# Patient Record
Sex: Male | Born: 1940 | Race: White | Hispanic: No | Marital: Married | Smoking: Never smoker
Health system: Southern US, Community
[De-identification: ages and names within clinical notes are randomized; demographics above are authoritative.]

## PROBLEM LIST (undated history)

## (undated) DIAGNOSIS — I2510 Atherosclerotic heart disease of native coronary artery without angina pectoris: Secondary | ICD-10-CM

## (undated) DIAGNOSIS — N481 Balanitis: Secondary | ICD-10-CM

## (undated) DIAGNOSIS — F32A Depression, unspecified: Secondary | ICD-10-CM

## (undated) DIAGNOSIS — N471 Phimosis: Secondary | ICD-10-CM

## (undated) DIAGNOSIS — F329 Major depressive disorder, single episode, unspecified: Secondary | ICD-10-CM

## (undated) DIAGNOSIS — I10 Essential (primary) hypertension: Secondary | ICD-10-CM

## (undated) DIAGNOSIS — G4730 Sleep apnea, unspecified: Secondary | ICD-10-CM

## (undated) DIAGNOSIS — N210 Calculus in bladder: Secondary | ICD-10-CM

## (undated) DIAGNOSIS — E119 Type 2 diabetes mellitus without complications: Secondary | ICD-10-CM

## (undated) DIAGNOSIS — C61 Malignant neoplasm of prostate: Secondary | ICD-10-CM

## (undated) DIAGNOSIS — E7800 Pure hypercholesterolemia, unspecified: Secondary | ICD-10-CM

## (undated) DIAGNOSIS — K219 Gastro-esophageal reflux disease without esophagitis: Secondary | ICD-10-CM

## (undated) HISTORY — DX: Depression, unspecified: F32.A

## (undated) HISTORY — PX: CIRCUMCISION: SUR203

## (undated) HISTORY — DX: Phimosis: N47.1

## (undated) HISTORY — DX: Essential (primary) hypertension: I10

## (undated) HISTORY — DX: Major depressive disorder, single episode, unspecified: F32.9

## (undated) HISTORY — DX: Pure hypercholesterolemia, unspecified: E78.00

## (undated) HISTORY — PX: HERNIA REPAIR: SHX51

## (undated) HISTORY — PX: CYSTOSCOPY: SUR368

## (undated) HISTORY — DX: Sleep apnea, unspecified: G47.30

## (undated) HISTORY — DX: Type 2 diabetes mellitus without complications: E11.9

## (undated) HISTORY — DX: Balanitis: N48.1

## (undated) HISTORY — DX: Calculus in bladder: N21.0

## (undated) HISTORY — DX: Gastro-esophageal reflux disease without esophagitis: K21.9

## (undated) HISTORY — PX: PROSTATE BIOPSY: SHX241

## (undated) HISTORY — DX: Malignant neoplasm of prostate: C61

## (undated) HISTORY — PX: OTHER SURGICAL HISTORY: SHX169

---

## 1998-12-01 ENCOUNTER — Encounter: Admission: RE | Admit: 1998-12-01 | Discharge: 1998-12-01 | Payer: Self-pay | Admitting: Family Medicine

## 1998-12-01 ENCOUNTER — Encounter: Payer: Self-pay | Admitting: Family Medicine

## 1998-12-03 ENCOUNTER — Encounter: Admission: RE | Admit: 1998-12-03 | Discharge: 1998-12-03 | Payer: Self-pay | Admitting: Family Medicine

## 1998-12-03 ENCOUNTER — Encounter: Payer: Self-pay | Admitting: Family Medicine

## 1999-12-30 ENCOUNTER — Encounter: Payer: Self-pay | Admitting: Gastroenterology

## 1999-12-30 ENCOUNTER — Encounter: Admission: RE | Admit: 1999-12-30 | Discharge: 1999-12-30 | Payer: Self-pay | Admitting: Gastroenterology

## 2000-03-24 ENCOUNTER — Encounter (INDEPENDENT_AMBULATORY_CARE_PROVIDER_SITE_OTHER): Payer: Self-pay

## 2000-03-24 ENCOUNTER — Ambulatory Visit (HOSPITAL_COMMUNITY): Admission: RE | Admit: 2000-03-24 | Discharge: 2000-03-24 | Payer: Self-pay | Admitting: Gastroenterology

## 2000-04-29 ENCOUNTER — Ambulatory Visit (HOSPITAL_BASED_OUTPATIENT_CLINIC_OR_DEPARTMENT_OTHER): Admission: RE | Admit: 2000-04-29 | Discharge: 2000-04-29 | Payer: Self-pay | Admitting: Internal Medicine

## 2000-05-17 ENCOUNTER — Encounter: Admission: RE | Admit: 2000-05-17 | Discharge: 2000-08-03 | Payer: Self-pay | Admitting: Internal Medicine

## 2000-06-03 ENCOUNTER — Ambulatory Visit (HOSPITAL_BASED_OUTPATIENT_CLINIC_OR_DEPARTMENT_OTHER): Admission: RE | Admit: 2000-06-03 | Discharge: 2000-06-03 | Payer: Self-pay | Admitting: Family Medicine

## 2001-02-09 ENCOUNTER — Encounter: Admission: RE | Admit: 2001-02-09 | Discharge: 2001-02-09 | Payer: Self-pay | Admitting: Internal Medicine

## 2001-02-09 ENCOUNTER — Encounter: Payer: Self-pay | Admitting: Internal Medicine

## 2001-06-26 ENCOUNTER — Ambulatory Visit (HOSPITAL_COMMUNITY): Admission: RE | Admit: 2001-06-26 | Discharge: 2001-06-26 | Payer: Self-pay | Admitting: Gastroenterology

## 2003-07-30 ENCOUNTER — Inpatient Hospital Stay (HOSPITAL_BASED_OUTPATIENT_CLINIC_OR_DEPARTMENT_OTHER): Admission: RE | Admit: 2003-07-30 | Discharge: 2003-07-30 | Payer: Self-pay | Admitting: Interventional Cardiology

## 2004-03-25 ENCOUNTER — Encounter (INDEPENDENT_AMBULATORY_CARE_PROVIDER_SITE_OTHER): Payer: Self-pay | Admitting: Specialist

## 2004-03-25 ENCOUNTER — Ambulatory Visit (HOSPITAL_COMMUNITY): Admission: RE | Admit: 2004-03-25 | Discharge: 2004-03-25 | Payer: Self-pay | Admitting: Gastroenterology

## 2006-03-21 ENCOUNTER — Encounter: Admission: RE | Admit: 2006-03-21 | Discharge: 2006-03-21 | Payer: Self-pay | Admitting: Internal Medicine

## 2007-03-20 ENCOUNTER — Ambulatory Visit (HOSPITAL_BASED_OUTPATIENT_CLINIC_OR_DEPARTMENT_OTHER): Admission: RE | Admit: 2007-03-20 | Discharge: 2007-03-20 | Payer: Self-pay | Admitting: Urology

## 2009-12-22 ENCOUNTER — Ambulatory Visit
Admission: RE | Admit: 2009-12-22 | Discharge: 2009-12-22 | Payer: Self-pay | Source: Home / Self Care | Attending: Urology | Admitting: Urology

## 2010-03-16 LAB — POCT I-STAT, CHEM 8
BUN: 11 mg/dL (ref 6–23)
Calcium, Ion: 1.11 mmol/L — ABNORMAL LOW (ref 1.12–1.32)
Chloride: 97 mEq/L (ref 96–112)
Creatinine, Ser: 0.9 mg/dL (ref 0.4–1.5)
Glucose, Bld: 140 mg/dL — ABNORMAL HIGH (ref 70–99)
HCT: 62 % — ABNORMAL HIGH (ref 39.0–52.0)
Hemoglobin: 21.1 g/dL (ref 13.0–17.0)
Potassium: 3 mEq/L — ABNORMAL LOW (ref 3.5–5.1)
Sodium: 138 mEq/L (ref 135–145)
TCO2: 29 mmol/L (ref 0–100)

## 2010-03-16 LAB — GLUCOSE, CAPILLARY: Glucose-Capillary: 140 mg/dL — ABNORMAL HIGH (ref 70–99)

## 2010-05-07 ENCOUNTER — Other Ambulatory Visit: Payer: Self-pay | Admitting: Dermatology

## 2010-05-19 NOTE — Op Note (Signed)
NAME:  Wesley Winters, Wesley Winters                  ACCOUNT NO.:  [id number]   MEDICAL RECORD NO.:  [id number]          PATIENT TYPE:  AMB   LOCATION:  [facility]                         FACILITY:  [facility]   PHYSICIAN:  Mark C. Karsten Ro, M.D.  DATE OF BIRTH:  30-Sep-1940   DATE OF PROCEDURE:  03/20/2007  DATE OF DISCHARGE:                               OPERATIVE REPORT   PREOPERATIVE DIAGNOSIS:  Phimosis and balanitis.   POSTOPERATIVE DIAGNOSIS:  Phimosis and balanitis.   PROCEDURE:  Circumcision.   SURGEON:  Mark C. Karsten Ro, M.D.   ANESTHESIA:  General with local supplement.   SPECIMENS:  None.   DRAINS:  None.   BLOOD LOSS:  Minimal.   COMPLICATIONS:  None.   INDICATIONS:  The patient is a 70 year old white male diabetic with  significant phimosis.  He has had intermittent mild balanitis, and is  unable to retract his foreskin for hygiene.  We have discussed the  treatment options.  He has elected to proceed with circumcision  understanding the risks, complications, alternatives.   DESCRIPTION OF OPERATION:  After informed consent, the patient was  brought to the major OR, placed on the table, administered general  anesthesia; after which his genitalia was sterilely prepped and draped.  An official time-out was then performed.  I then attempted to retract  his foreskin, but was able to, due to have significant scarring and  phimosis.  I was able to retract the foreskin enough to visualize the  glans, and I placed a straight clamp across the phimotic foreskin at the  12 o'clock position, clamped this in the midline, and then cut with the  Metzenbaum scissors along the clamped area.  This dorsal slit allowed me  to then retract the foreskin, and visualize the glans and the inner  preputial skin.  These areas were then reprepped with Betadine.   A circumcising incision was then made about 7 mm proximal to the corona  circumferentially.  I had to go a little bit further back than I usually  do  because of the significant scarring of the shaft skin that would  require excision.  I then replaced the foreskin in its normal anatomic  position, and performed a second circumcising incision  circumferentially, and this encompassed all of the very severely scarred  tissue.  I then incised the preputial skin in the midline, and then  excised this from the penile shaft.   Bleeding points were then cauterized, and the frenulum was  reapproximated in the midline with several interrupted 4-0 chromic  sutures.  A 3-0 chromic suture was then used as a U-stitch at the 6  o'clock position, and a second 3-0 chromic was placed at the 12 o'clock  position.  These were then used to reapproximate the skin edges  circumferentially.  I then applied Neosporin, performed a dorsal penile  block with 20 mL of plain Marcaine at 1/2% concentration, and then used  10 mL to perform a ring block at the base of the penis.  Sterile gauze  was loosely wrapped around the penis, and a Coban was  then used to affix  this to the penis loosely.  The patient tolerated the procedure well.  There were no intraoperative complications.  Needle, sponge, and  instrument counts were reportedly correct x2 at the end of the  operation.   He will be given a prescription for Tylox #30 and Cipro 500 mg b.i.d.  #10, and will follow up in my office in 1 week.      Mark C. Karsten Ro, M.D.  Electronically Signed     MCO/MEDQ  D:  03/20/2007  T:  03/20/2007  Job:  [id number]

## 2010-05-22 NOTE — Procedures (Signed)
[place]  Patient:    Wesley Winters, Wesley Winters                           MRN: [id number] Proc. Date: 03/24/00 Attending:  Raymon Mutton, M.D.                           Procedure Report  PROCEDURE:  Esophagogastroduodenoscopy and gastric polyp biopsy.  PROCEDURE INDICATION:  Mr. Wesley Winters. Winters (date of birth [birth year]) is a 70 year old male with unexplained epigastric and chest pain.  In May 1993, Wesley Winters underwent colonoscopy, and a 5 mm adenomatous polyp was removed.  In February 1994 and February 1995, esophagogastroduodenoscopy exams were normal.  On each occasion, the CLOtest for Helicobacter pylori antral gastritis were negative.  In August 1996, proctocolonoscopy to the cecum was normal.  In February 1995 and October 1998, abdominal ultrasounds revealed a normal gallbladder.  In November 1998, an esophagogastroduodenoscopy was normal.  A 5 mm sessile polyp was removed from the distal gastric antrum.  In January 1999, esophageal manometry was normal.  Twenty-four hour esophageal pH monitor was normal.  On December 30, 1999, upper GI x-ray series revealed a small hiatal hernia and trace gastroesophageal reflux; otherwise, the exam was normal.  His abdominal ultrasound revealed diffuse fatty infiltration of the gallbladder, incompletely visualized pancreas, bilateral renal cysts, and no gallstones.  CHRONIC MEDICATIONS:  Potassium chloride, Prilosec, Lotrel, Nitrostat, Celexa, temazepam, enteric-coated aspirin, Claritin, Pravachol, and Flonase.  ENDOSCOPIST:  Raymon Mutton, M.D.  PREMEDICATION:   Fentanyl 50 mcg, Versed 7 mg.  ENDOSCOPE:  Olympus gastroscope.  DESCRIPTION OF PROCEDURE:  After obtaining informed consent, the patient was placed in the left lateral decubitus position.  I administered intravenous fentanyl and intravenous Versed to achieve conscious sedation for the procedure.  The patients blood pressure,  oxygen saturation, and cardiac rhythm were monitored throughout the procedure and documented in the medical record.  The Olympus gastroscope was passed through the posterior hypopharynx into the proximal esophagus without difficulty.  The hypopharynx, larynx, and vocal cords appeared normal.  Esophagoscopy:  The proximal, mid-, and lower segments of the esophagus appeared normal.  The squamocolumnar junction and the esophagogastric junction are noted at approximately 40 cm from the incisor teeth.  Endoscopically, there is no evidence for the presence of Barretts esophagus, erosive esophagitis, esophageal mucosal scarring, or esophageal ulceration.  Gastroscopy:  Retroflex view of the gastric cardia and fundus was normal.  In the distal gastric body, there is a 3 mm sessile polyp, which was biopsied twice.  There appeared to be an excessive amount of bleeding following the biopsy, and an Endoclip was applied to ensure hemostasis.  In the proximal gastric antrum, a 2 mm sessile polyp was biopsied.  Otherwise, the gastric body, antrum, and pylorus appeared normal.  Duodenoscopy:  Duodenal bulb, mid-duodenum, and distal duodenum appeared normal.  ASSESSMENT:  A 5 mm polyp was biopsied from the distal gastric body, and an Endoclip was applied to the polyp to ensure hemostasis.  A 2 mm sessile polyp was biopsied from the distal gastric antrum.  Otherwise normal esophagogastroduodenoscopy.  PLAN:  I will schedule Wesley Winters to see me back in the office to go over his biopsies.  I suspect he has gastroesophageal reflux causing his symptoms and may need to be evaluated for laparoscopic Nissen fundoplication.  He has not undergone a CT  scan of the abdomen, which might be wise to obtain preoperatively to get a good view of the pancreas. DD:  03/24/00 TD:  03/24/00 Job: [id number]

## 2010-05-22 NOTE — Cardiovascular Report (Signed)
NAME:  Wesley Winters, Wesley Winters                            ACCOUNT NO.:  [id number]   MEDICAL RECORD NO.:  [id number]                   PATIENT TYPE:  OIB   LOCATION:  6501                                 FACILITY:  [facility]   PHYSICIAN:  Sinclair Grooms, M.D.            DATE OF BIRTH:  [birth year]   DATE OF PROCEDURE:  07/30/2003  DATE OF DISCHARGE:  07/30/2003                              CARDIAC CATHETERIZATION   INDICATION FOR STUDY:  Recent abnormal Cardiolite study.  The patient has  prior history of coronary artery disease with LAD stent and right coronary  stent.  Recent episodes of chest burning.   PROCEDURE PERFORMED:  1. Left heart.  2. Selective coronary angiography.  3. Left ventriculography.   DESCRIPTION:  After informed consent a 4 French sheath was placed in the  right femoral using a modified Seldinger technique.  A 4 French A2  multipurpose catheter was used for hemodynamic recordings, left  ventriculography by hand injection, and selective right coronary  angiography.  A #4 left Judkins catheter was used for left coronary  angiography.  The patient tolerated the procedure well without complication.   RESULTS:  1. Hemodynamic data:     a. Aortic pressure 119/62.     b. Left ventricular pressure 122/2.  2. Left ventriculography:  LV systolic function is normal.  The EF is     greater than 70%.  No MR is noted.  3. Coronary angiogram:     a. Left main coronary:  Widely patent.     b. Left anterior descending coronary artery:  The mid LAD contained 30%        eccentric narrowing.  The LAD stent is widely patent.  The LAD is        transapical.  No significant marginal stenoses are noted.     c. Circumflex artery:  The circumflex was large, gives origin to two        large obtuse marginal branches, both of which are normal.     d. Right coronary:  The right coronary is widely patent.  The proximal        and mid right coronary stents are widely patent.   CONCLUSIONS:  1.  False positive stress Cardiolite study demonstrating an inferior wall     abnormality that does not represent a perfusion defect.  2. Essentially widely-patent coronaries with patent left anterior descending     coronary artery and right coronary     stents.  No significant obstructive lesions are noted throughout the     coronary arterial tree.  3. Normal left ventricular function.   PLAN:  Manage chest discomfort as gastrointestinal in origin.  Sinclair Grooms, M.D.    HWS/MEDQ  D:  07/30/2003  T:  07/30/2003  Job:  [id number]   cc:   Wenda Low, M.D.  [place], [address]  [place] [address]  Fax: [phone]   Delanna Ahmadi, M.D.  [address] [place] [address]  Fax: [phone]

## 2010-05-22 NOTE — Op Note (Signed)
Wesley Winters, RASK NO.:  [id number]   MEDICAL RECORD NO.:  [id number]          PATIENT TYPE:  AMB   LOCATION:  ENDO                         FACILITY:  [facility]   PHYSICIAN:  Earle Gell, M.D.   DATE OF BIRTH:  31-Jan-1940   DATE OF PROCEDURE:  03/25/2004  DATE OF DISCHARGE:                                 OPERATIVE REPORT   PROCEDURE:  Esophagogastroduodenoscopy   PROCEDURE INDICATIONS:  Wesley Winters is a 70 year old male born Jun 01, 1940.  Wesley Winters has chronic gastroesophageal reflux manifested by burning  retrosternal discomfort (heartburn) nausea with occasional dry heaves, but  no associated dysphagia or dilatation.   January 22, 1997, esophageal manometry and 24-hour esophageal pH study were  normal.   February 23, 1992, and February 22, 1993, esophagogastroduodenoscopy with  CLO test negative.   Wesley. Kilmer' symptoms did not improve on Nexium 40 mg twice daily.   CURRENT MEDICATIONS:  1.  Nexium 40 mg each morning.  2.  Lotrel for hypertension.  3.  Diovan for hypertension.  4.  Fluoxetine.  5.  Temazepam.  6.  Enteric-coated aspirin.  7.  Allegra.  8.  Lipitor.  9.  Astelin nasal spray.  10. Amaryl for diabetes.  11. Metformin for diabetes.  12. Nitrostat for angina pectoris.   ENDOSCOPIST:  Earle Gell, M.D.   PREMEDICATION:  Versed 5 mg, Demerol 50 mg.   PROCEDURE:  After obtaining informed consent,  Wesley Winters was placed in the  left lateral decubitus position. I administered intravenous Demerol and  intravenous Versed to achieve conscious sedation for the procedure. The  patient's blood pressure, oxygen saturation and cardiac rhythm were  monitored throughout the procedure and documented in the medical record.   The Olympus gastroscope was passed through the posterior hypopharynx into  the proximal esophagus without difficulty. The hypopharynx, larynx and vocal  cords appeared normal.   Esophagoscopy: The proximal, mid and lower  segments of the esophageal mucosa  appear completely normal. The squamocolumnar junction and esophagogastric  junction are noted at 42 cm from the incisor teeth. There are no esophageal  mucosal erosions. There is no endoscopic evidence for the presence of  erosive esophagitis, Barrett's esophagus or esophageal mucosal scarring with  stricture formation.   Gastroscopy: Retroflex view of the gastric cardia and fundus was normal.  There are scattered raised erosions in the gastric body which were biopsied.  There are numerous linear erosions in the gastric antrum. The pylorus  appears normal.   Duodenoscopy: The duodenal bulb, mid duodenum and distal duodenum appeared  normal.   ASSESSMENT:  1.  Gastroesophageal reflux associated with a normal-appearing esophagus.  2.  Raised erosions in the gastric body and linear erosions in the gastric      antrum, probably secondary to aspirin. Biopsies to look for Helicobacter      pylori pending.   RECOMMENDATIONS:  I will ask Wesley. Wesley Winters to stop aspirin for a while and  continue to take Nexium each morning and see if that improves his symptoms.  If there is no improvement, I will  repeat his esophageal pH study on  Nexium.      MJ/MEDQ  D:  03/25/2004  T:  03/25/2004  Job:  [id number]   cc:   Delanna Ahmadi, M.D.  [address] [place] [address]  Fax: [phone]

## 2010-05-22 NOTE — Procedures (Signed)
[facility]  Patient:    Wesley Winters, Wesley Winters Visit Number: [id number] MRN: [id number]          Service Type: END Location: ENDO Attending Physician:  Mauri Brooklyn Ii Dictated by:   Mickeal Skinner, M.D. Proc. Date: 06/26/01 Admit Date:  06/26/2001 Discharge Date: 06/26/2001                             Procedure Report  PROCEDURE:  Screening colonoscopy.  REFERRING PHYSICIAN:  Dr. Ala Bent.  INDICATION FOR PROCEDURE:  The patient is a 70 year old male born June 08, 1939. [name] is due for his first screening colonoscopy with polypectomy to prevent colon cancer.  I discussed with the patient the complications associated with colonoscopy and polypectomy including a 15/1000 risk of bleeding and 04/998 risk of colon perforation requiring surgical repair. [name] has signed the operative permit.  ENDOSCOPIST:  Mickeal Skinner, M.D.  PREMEDICATION:  Versed 5 mg, fentanyl 50 mcg.  ENDOSCOPE:  Olympus pediatric colonoscope.  DESCRIPTION OF PROCEDURE:  After obtaining informed consent, the patient was placed in the left lateral decubitus position. I administered intravenous fentanyl and intravenous Versed to achieve conscious sedation for the procedure. The patients cardiac rhythm, oxygen saturation and blood pressure were monitored throughout the procedure and documented in the medical record.  Anal inspection was normal. Digital rectal exam revealed an enlarged but non-nodular prostate. The Olympus pediatric video colonoscope was introduced into the rectum and easily advanced to the cecum. Colonic preparation for the exam today was excellent.  RECTUM:  From the mid rectum, a 1 mm sessile polyp was removed with the hot biopsy forceps.  SIGMOID COLON/DESCENDING COLON:  Normal.  SPLENIC FLEXURE:  Normal.  TRANSVERSE COLON:  Normal.  HEPATIC FLEXURE:  Normal.  ASCENDING COLON:  Normal.  CECUM/ILEOCECAL VALVE:   Normal.  ASSESSMENT: From the mid rectum, a 1 mm sessile polyp was removed with the hot biopsy forceps; otherwise, normal proctocolonoscopy to the cecum.  RECOMMENDATIONS:  If rectal polyp returns neoplastic pathologically, [name] should undergo a repeat colonoscopy in five years. Dictated by:   Mickeal Skinner, M.D. Attending Physician:  Mauri Brooklyn Ii DD:  06/26/01 TD:  06/26/01 Job: [id number]

## 2010-05-22 NOTE — Cardiovascular Report (Signed)
NAME:  Wesley Winters, Wesley Winters                            ACCOUNT NO.:  [id number]   MEDICAL RECORD NO.:  [id number]                   PATIENT TYPE:  OIB   LOCATION:  6501                                 FACILITY:  [facility]   PHYSICIAN:  Sinclair Grooms, M.D.            DATE OF BIRTH:  26-Jul-1940   DATE OF PROCEDURE:  DATE OF DISCHARGE:  07/30/2003                              CARDIAC CATHETERIZATION   Audio too short to transcribe (less than 5 seconds)                                               Sinclair Grooms, M.D.    HWS/MEDQ  D:  07/30/2003  T:  07/30/2003  Job:  [id number]

## 2010-06-18 ENCOUNTER — Other Ambulatory Visit: Payer: Self-pay | Admitting: Dermatology

## 2010-08-03 ENCOUNTER — Other Ambulatory Visit: Payer: Self-pay | Admitting: Gastroenterology

## 2010-09-28 LAB — POCT I-STAT 4, (NA,K, GLUC, HGB,HCT)
Glucose, Bld: 153 — ABNORMAL HIGH
HCT: 38 — ABNORMAL LOW
Hemoglobin: 12.9 — ABNORMAL LOW
Operator id: 268271
Potassium: 3.8
Sodium: 139

## 2012-08-21 ENCOUNTER — Other Ambulatory Visit: Payer: Self-pay | Admitting: Dermatology

## 2014-03-28 ENCOUNTER — Other Ambulatory Visit: Payer: Self-pay | Admitting: Dermatology

## 2014-09-23 ENCOUNTER — Other Ambulatory Visit: Payer: Self-pay | Admitting: General Surgery

## 2014-09-24 ENCOUNTER — Other Ambulatory Visit: Payer: Self-pay | Admitting: General Surgery

## 2014-09-24 DIAGNOSIS — R2242 Localized swelling, mass and lump, left lower limb: Secondary | ICD-10-CM

## 2014-10-07 ENCOUNTER — Ambulatory Visit
Admission: RE | Admit: 2014-10-07 | Discharge: 2014-10-07 | Disposition: A | Discharge status: Home / Self Care | Payer: Medicare Other | Source: Ambulatory Visit | Attending: General Surgery | Admitting: General Surgery

## 2014-10-07 MED ORDER — GADOBENATE DIMEGLUMINE 529 MG/ML IV SOLN
20.0000 mL | Freq: Once | INTRAVENOUS | Status: AC | PRN
  Administered 2014-10-07: 20 mL via INTRAVENOUS

## 2014-10-10 ENCOUNTER — Other Ambulatory Visit (HOSPITAL_COMMUNITY): Payer: Self-pay | Admitting: Urology

## 2014-10-10 DIAGNOSIS — C61 Malignant neoplasm of prostate: Secondary | ICD-10-CM

## 2014-10-15 ENCOUNTER — Encounter (HOSPITAL_COMMUNITY)
Admission: RE | Admit: 2014-10-15 | Discharge: 2014-10-15 | Disposition: A | Discharge status: Home / Self Care | Payer: Medicare Other | Source: Ambulatory Visit | Attending: Urology | Admitting: Urology

## 2014-10-15 DIAGNOSIS — C61 Malignant neoplasm of prostate: Secondary | ICD-10-CM | POA: Diagnosis not present

## 2014-10-15 MED ORDER — TECHNETIUM TC 99M MEDRONATE IV KIT
26.4000 | PACK | Freq: Once | INTRAVENOUS | Status: AC | PRN
  Administered 2014-10-15: 26.4 via INTRAVENOUS

## 2014-11-01 ENCOUNTER — Encounter: Payer: Self-pay | Admitting: Radiation Oncology

## 2014-11-01 NOTE — Progress Notes (Signed)
GU Location of Tumor / Histology: prostatic adenocarcinoma   If Prostate Cancer, Gleason Score is (4 + 5) and PSA is (11.06)  Thea Silversmith presented 05/2001 with an elevated PSA of 4.03 and no abnormalities on DRE. Has been under active surveillance since.  Third biopsies of prostate revealed:    Past/Anticipated interventions by urology, if any: prostate biopsy x 3, active surveillance, referred to Dr. Tammi Klippel   Past/Anticipated interventions by medical oncology, if any: no  Weight changes, if any: no  Bowel/Bladder complaints, if any: urinary frequency, feelings of urinary urgency, nocturia, incontinence, difficulty starting the urinary stream, weak urine stream, intermittent urine stream. Denies dysuria or hematuria.  Nausea/Vomiting, if any: no  Pain issues, if any:  no  SAFETY ISSUES:  Prior radiation? no  Pacemaker/ICD? no  Possible current pregnancy? no  Is the patient on methotrexate? no  Current Complaints / other details:  74 year old male. Married. Retired. PROSTATE VOLUME 124 CC

## 2014-11-04 ENCOUNTER — Encounter: Payer: Self-pay | Admitting: Radiation Oncology

## 2014-11-04 ENCOUNTER — Ambulatory Visit
Admission: RE | Admit: 2014-11-04 | Discharge: 2014-11-04 | Disposition: A | Discharge status: Home / Self Care | Payer: Medicare Other | Source: Ambulatory Visit | Attending: Radiation Oncology | Admitting: Radiation Oncology

## 2014-11-04 ENCOUNTER — Encounter: Payer: Self-pay | Admitting: Medical Oncology

## 2014-11-04 VITALS — BP 129/64 | HR 70 | Resp 16 | Ht 68.0 in | Wt 265.6 lb

## 2014-11-04 DIAGNOSIS — N481 Balanitis: Secondary | ICD-10-CM | POA: Insufficient documentation

## 2014-11-04 DIAGNOSIS — Z809 Family history of malignant neoplasm, unspecified: Secondary | ICD-10-CM | POA: Diagnosis not present

## 2014-11-04 DIAGNOSIS — I10 Essential (primary) hypertension: Secondary | ICD-10-CM | POA: Diagnosis not present

## 2014-11-04 DIAGNOSIS — E7800 Pure hypercholesterolemia, unspecified: Secondary | ICD-10-CM | POA: Insufficient documentation

## 2014-11-04 DIAGNOSIS — F329 Major depressive disorder, single episode, unspecified: Secondary | ICD-10-CM | POA: Insufficient documentation

## 2014-11-04 DIAGNOSIS — C61 Malignant neoplasm of prostate: Secondary | ICD-10-CM | POA: Insufficient documentation

## 2014-11-04 DIAGNOSIS — Z510 Encounter for antineoplastic radiation therapy: Secondary | ICD-10-CM | POA: Diagnosis not present

## 2014-11-04 DIAGNOSIS — E119 Type 2 diabetes mellitus without complications: Secondary | ICD-10-CM | POA: Diagnosis not present

## 2014-11-04 NOTE — Progress Notes (Signed)
Oncology Nurse Navigator Documentation  Oncology Nurse Navigator Flowsheets 11/04/2014  Referral date to RadOnc/MedOnc 10/29/2014  Navigator Encounter Type Initial RadOnc- I introduced myself to Mr. Wesley Winters and his wife and explained my role as the navigator. I gave them my business card and I will follow them as the begins his radiation treatments. I asked them to call me with any questions or concerns and they voiced understanding.  Patient Visit Type Radonc  Barriers/Navigation Needs No barriers at this time  Support Groups/Services Friends and Family  Time Spent with Patient 15

## 2014-11-04 NOTE — Progress Notes (Signed)
Radiation Oncology         [phone] ________________________________  Initial outpatient Consultation  Name: Wesley Winters MRN: [id number]  Date: 11/04/2014  DOB: [birth year]  [id number] Wesley John, MD  [name], MD   REFERRING PHYSICIAN: [name], MD  DIAGNOSIS: 74 y.o. 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  HISTORY OF PRESENT ILLNESS::Wesley Winters is a 74 y.o. gentleman originally diagnosed with prostate cancer on 11/03/07. At that time, he had one of 12 cores positive with Gleason's 3+3 in the right lateral apex. Appropriately, he elected active surveillance. More recently, he was noted to have an elevated PSA of 11.06. He was seen for evaluation in urology by Dr. Karsten Ro on 09/10/14,  digital rectal examination was performed at that time revealing a palpable nodule involving the left apex.  The patient proceeded to transrectal ultrasound with 12 biopsies of the prostate on 10/01/14.  The prostate volume measured 124 cc.  Out of 12 core biopsies, 7 were positive.  The maximum Gleason score was 4+5, and this was seen in the left lateral mid.  On 10/15/14, bone scan showed no evidence of metastatic disease. That same day, CT of the pelvis showed prostatomegaly with no evidence of metastatic disease.   The patient reviewed the biopsy results with his urologist and he has kindly been referred today for discussion of potential radiation treatment options.    PREVIOUS RADIATION THERAPY: No  PAST MEDICAL HISTORY:  has a past medical history of Prostate cancer ([place]); Bladder calculus; Balanitis; Depression; Diabetes mellitus without complication ([place]); GERD (gastroesophageal reflux disease); Hypercholesterolemia; Hypertension; Phimosis; and Sleep apnea.    PAST SURGICAL HISTORY: Past Surgical History  Procedure Laterality Date  . Circumcision    . Cystoscopy    . Prostate biopsy    .  Prostate biopsy    . Prostate biopsy    . Three cardiac stents      FAMILY HISTORY: family history includes Cancer in his brother.  SOCIAL HISTORY:  reports that he has never smoked. He has never used smokeless tobacco. He reports that he does not drink alcohol or use illicit drugs.  ALLERGIES: Amoxicillin  MEDICATIONS:  Current Outpatient Prescriptions  Medication Sig Dispense Refill  . amLODipine (NORVASC) 10 MG tablet   2  . aspirin 81 MG tablet Take 81 mg by mouth daily.    Marland Kitchen atorvastatin (LIPITOR) 40 MG tablet Take 40 mg by mouth daily.    Marland Kitchen azelastine (ASTELIN) 0.1 % nasal spray Place into both nostrils 2 (two) times daily. Use in each nostril as directed    . benazepril (LOTENSIN) 20 MG tablet Take 20 mg by mouth daily.    . fexofenadine (ALLEGRA) 180 MG tablet Take 180 mg by mouth daily.    Marland Kitchen FLUoxetine (PROZAC) 10 MG tablet   2  . fluticasone (FLONASE) 50 MCG/ACT nasal spray Place into both nostrils daily.    Marland Kitchen glimepiride (AMARYL) 4 MG tablet Take 4 mg by mouth daily with breakfast.    . hydrochlorothiazide (HYDRODIURIL) 25 MG tablet Take 25 mg by mouth daily.    Marland Kitchen linagliptin (TRADJENTA) 5 MG TABS tablet Take 5 mg by mouth daily.    . metFORMIN (GLUCOPHAGE) 500 MG tablet Take by mouth 2 (two) times daily with a meal.    . nebivolol (BYSTOLIC) 10 MG tablet Take 10 mg by mouth daily.    . potassium chloride (  K-DUR,KLOR-CON) 10 MEQ tablet Take 10 mEq by mouth 2 (two) times daily.    . tamsulosin (FLOMAX) 0.4 MG CAPS capsule Take 0.4 mg by mouth.    . temazepam (RESTORIL) 15 MG capsule Take 15 mg by mouth at bedtime as needed for sleep.    Marland Kitchen triamcinolone cream (KENALOG) 0.1 % Apply 1 application topically 2 (two) times daily.     No current facility-administered medications for this encounter.    REVIEW OF SYSTEMS:  A 15 point review of systems is documented in the electronic medical record. This was obtained by the nursing staff. However, I reviewed this with the patient  to discuss relevant findings and make appropriate changes.  Pertinent items noted in HPI and remainder of comprehensive ROS otherwise negative..  The patient completed an IPSS and IIEF questionnaire.  His IPSS score was 28 indicating severe urinary outflow obstructive symptoms.  He indicated that his erectile function is unable to complete sexual activity.  Urinary frequency, feelings of urinary urgency, nocturia, incontinence, difficulty starting the urinary stream, weak urine stream, intermittent urine stream. Denies dysuria or hematuria. Denies pain or vomiting. The patient currently takes two Flomax nightly before bed. The patient would prefer to begin treatment the beginning of next year and to receive treatment in the afternoons.    PHYSICAL EXAM: This patient is in no acute distress.  He is alert and oriented.   height is 5\' 8"  (1.727 m) and weight is 265 lb 9.6 oz (120.475 kg). His blood pressure is 129/64 and his pulse is 70. His respiration is 16 and oxygen saturation is 100%.  He exhibits no respiratory distress or labored breathing.  He appears neurologically intact.  His mood is pleasant.  His affect is appropriate.  Please note the digital rectal exam findings described above.  KPS = 90  100 - Normal; no complaints; no evidence of disease. 90   - Able to carry on normal activity; minor signs or symptoms of disease. 80   - Normal activity with effort; some signs or symptoms of disease. 38   - Cares for self; unable to carry on normal activity or to do active work. 60   - Requires occasional assistance, but is able to care for most of his personal needs. 50   - Requires considerable assistance and frequent medical care. 52   - Disabled; requires special care and assistance. 49   - Severely disabled; hospital admission is indicated although death not imminent. 53   - Very sick; hospital admission necessary; active supportive treatment necessary. 10   - Moribund; fatal processes  progressing rapidly. 0     - Dead  Karnofsky DA, Abelmann [place], Craver LS and Burchenal [facility] [phone]) The use of the nitrogen mustards in the palliative treatment of carcinoma: with particular reference to bronchogenic carcinoma Cancer 1 634-56   LABORATORY DATA:  Lab Results  Component Value Date   HGB 21.1* 12/22/2009   HCT 62.0* 12/22/2009   Lab Results  Component Value Date   NA 138 12/22/2009   K 3.0* 12/22/2009   CL 97 12/22/2009   No results found for: ALT, AST, GGT, ALKPHOS, BILITOT   RADIOGRAPHY: Nm Bone Scan Whole Body  10/15/2014  CLINICAL DATA:  Prostate cancer, PSA 11.06, bilateral hip pain status post fall on 11/2013 EXAM: NUCLEAR MEDICINE WHOLE BODY BONE SCAN TECHNIQUE: Whole body anterior and posterior images were obtained approximately 3 hours after intravenous injection of radiopharmaceutical. RADIOPHARMACEUTICALS:  26.4 mCi Technetium-52m MDP IV COMPARISON:  CT pelvis dated 10/15/2014 FINDINGS: Degenerative radiotracer uptake involving the bilateral knees, right ankle, and bilateral feet. Suspected degenerative uptake involving the posterior aspect of the mid lumbar spine, likely L3. No abnormal accumulation of radiotracer within the axillary or appendicular skeleton specific for skeletal metastases. IMPRESSION: No scintigraphic evidence of skeletal metastases. Suspected degenerative uptake involving the posterior aspect of L3. Electronically Signed   By: Julian Hy M.D.   On: 10/15/2014 15:17   Mr Tibia Fibula Left W Wo Contrast  10/08/2014  CLINICAL DATA:  Left lower extremity soft tissue mass. Mass noticed after a fall in November 2015. Slowly enlarging since that time. EXAM: MRI OF LOWER LEFT EXTREMITY WITHOUT AND WITH CONTRAST TECHNIQUE: Multiplanar, multisequence MR imaging of the left was performed both before and after administration of intravenous contrast. CONTRAST:  78mL MULTIHANCE GADOBENATE DIMEGLUMINE 529 MG/ML IV SOLN COMPARISON:  None FINDINGS: There is a  3.5 cm rounded mass in the subcutaneous fat along the anterior medial aspect of the distal tibia. There is heterogeneous increased T1 signal intensity most likely representing hematoma. However, there is fairly significant thick rim-like enhancement and possibly some intrinsic enhancement of this lesion which is somewhat atypical for a hematoma. It may be a soft tissue mass which has bled. Also, a hematoma should contract and not enlarged with time. Soft tissue sarcoma or melanoma are possibilities. It does not have the appearance of a nerve sheath tumor. The calf musculature is normal other than mild diffuse fatty change. The underlying tibia is normal. No major vessel or nerve involvement. IMPRESSION: 1. 3.5 cm subcutaneous lesion involving the distal left lower extremity could represent a walled-off hematoma with surrounding granulation tissue but is more worrisome for a soft tissue sarcoma that has hemorrhaged. Recommend excision or biopsy. 2. No involvement of the underlying tibia or lower extremity musculature. Electronically Signed   By: Marijo Sanes M.D.   On: 10/08/2014 08:37      IMPRESSION: This gentleman is a 74 y.o. 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  His T-Stage, Gleason's Score, and PSA put him into the high risk group.  Accordingly he is eligible for a variety of potential treatment options including hormone therapy and radiation therapy. The patient could start radiation in late December versus delaying his start until late January, his preference would be to wait until late January, due to health insurance.  PLAN: Today, I talked to the patient and family about the findings and work-up thus far.  We discussed the natural history of prostatic adenocarcinoma and general treatment, highlighting the role of radiotherapy in the management.  We discussed the available radiation techniques, and focused on the details of logistics  and delivery.  We reviewed the anticipated acute and late sequelae associated with radiation in this setting.  The patient was encouraged to ask questions that I answered to the best of my ability.  The patient would like to proceed with radiation and has been scheduled for CT simulation on 01/16/14 at 3 pm.  We'll need to contact Dr. Karsten Ro to place gold markers in early January 2017.  I spent 60 minutes minutes face to face with the patient and more than 50% of that time was spent in counseling and/or coordination of care.  ------------------------------------------------  Sheral Apley. Tammi Klippel, M.D.    This document serves as a record of services personally performed by Wesley Pita, MD. It was created on his behalf by  Arlyce Harman, a trained medical scribe. The creation of this record is based on the scribe's personal observations and the provider's statements to them. This document has been checked and approved by the attending provider.

## 2014-11-04 NOTE — Progress Notes (Signed)
See progress note under physician encounter. 

## 2014-11-04 NOTE — Addendum Note (Signed)
Encounter addended by: Heywood Footman, RN on: 11/04/2014 10:00 AM<BR>     Documentation filed: Charges VN

## 2014-11-06 ENCOUNTER — Telehealth: Payer: Self-pay | Admitting: Medical Oncology

## 2014-11-06 NOTE — Telephone Encounter (Signed)
Oncology Nurse Navigator Documentation  Oncology Nurse Navigator Flowsheets 11/04/2014 11/05/2014  Referral date to RadOnc/MedOnc 10/29/2014 -  Navigator Encounter Type Initial RadOnc Telephone  Patient Visit Type Radonc -  Treatment Phase - Treatment- Wesley Winters saw Dr. Tammi Klippel yesterday regarding radiation treatments. He did not want to start his treatments until January due to insurance. After he got home he is not sure he made the right decision. We discussed the nature of prostate cancer and that the androgen deprivation is part of the treatment plan. If Dr. Tammi Klippel did not think waiting to begin radiation was an option he would have informed him. He states he feels better about his decision. I asked him to call me with any questions or concerns.  Barriers/Navigation Needs No barriers at this time Education  Education - Other  Interventions - Education Method  Education Method - Verbal  Support Groups/Services Friends and Family Friends and Family  Time Spent with Patient 15 15

## 2014-12-02 ENCOUNTER — Telehealth: Payer: Self-pay | Admitting: *Deleted

## 2014-12-02 NOTE — Telephone Encounter (Signed)
CALLED PATIENT TO INFORM OF GOLD SEED PLACEMENT ON 01-14-15- ARRIVAL TIME - 1 PM @ DR. OTTELIN'S OFFICE AND HIS SIM ON 01-17-15 @ 3 PM @ DR. MANNING'S OFFICE, LVM FOR A RETURN CALL

## 2015-01-17 ENCOUNTER — Ambulatory Visit
Admission: RE | Admit: 2015-01-17 | Discharge: 2015-01-17 | Disposition: A | Discharge status: Home / Self Care | Payer: Medicare Other | Source: Ambulatory Visit | Attending: Radiation Oncology | Admitting: Radiation Oncology

## 2015-01-17 DIAGNOSIS — Z510 Encounter for antineoplastic radiation therapy: Secondary | ICD-10-CM | POA: Diagnosis not present

## 2015-01-17 DIAGNOSIS — C61 Malignant neoplasm of prostate: Secondary | ICD-10-CM

## 2015-01-17 NOTE — Progress Notes (Signed)
  Radiation Oncology         [phone] ________________________________  Name: Wesley Winters  MRN: [id number]  Date: 01/17/2015  DOB: 1941-01-04  SIMULATION AND TREATMENT PLANNING NOTE    ICD-9-CM ICD-10-CM   1. Malignant neoplasm of prostate ([place]) 185 C61     DIAGNOSIS:  75 y.o. 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  NARRATIVE:  The patient was brought to the [facility].  Identity was confirmed.  All relevant records and images related to the planned course of therapy were reviewed.  The patient freely provided informed written consent to proceed with treatment after reviewing the details related to the planned course of therapy. The consent form was witnessed and verified by the simulation staff.  Then, the patient was set-up in a stable reproducible supine position for radiation therapy.  A vacuum lock pillow device was custom fabricated to position his legs in a reproducible immobilized position.  Then, I performed a urethrogram under sterile conditions to identify the prostatic apex.  CT images were obtained.  Surface markings were placed.  The CT images were loaded into the planning software.  Then the prostate target and avoidance structures including the rectum, bladder, bowel and hips were contoured.  Treatment planning then occurred.  The radiation prescription was entered and confirmed.  A total of one complex treatment devices were fabricated. I have requested : Intensity Modulated Radiotherapy (IMRT) is medically necessary for this case for the following reason:  Rectal sparing.Marland Kitchen  PLAN:  The patient will receive 75 Gy in 40 fractions.  ________________________________  Sheral Apley Tammi Klippel, M.D.  This document serves as a record of services personally performed by Tyler Pita, MD. It was created on his behalf by Jenell Milliner, a trained medical scribe. The creation of this record is based on  the scribe's personal observations and the provider's statements to them. This document has been checked and approved by the attending provider.

## 2015-01-21 ENCOUNTER — Ambulatory Visit: Payer: Medicare Other | Admitting: Radiation Oncology

## 2015-01-22 ENCOUNTER — Ambulatory Visit: Payer: Medicare Other

## 2015-01-22 DIAGNOSIS — Z510 Encounter for antineoplastic radiation therapy: Secondary | ICD-10-CM | POA: Diagnosis not present

## 2015-01-23 ENCOUNTER — Ambulatory Visit: Payer: Medicare Other

## 2015-01-24 ENCOUNTER — Ambulatory Visit: Payer: Medicare Other

## 2015-01-27 ENCOUNTER — Ambulatory Visit: Payer: Medicare Other

## 2015-01-28 ENCOUNTER — Ambulatory Visit
Admission: RE | Admit: 2015-01-28 | Discharge: 2015-01-28 | Disposition: A | Discharge status: Home / Self Care | Payer: Medicare Other | Source: Ambulatory Visit | Attending: Radiation Oncology | Admitting: Radiation Oncology

## 2015-01-28 ENCOUNTER — Ambulatory Visit: Payer: Medicare Other

## 2015-01-28 DIAGNOSIS — Z510 Encounter for antineoplastic radiation therapy: Secondary | ICD-10-CM | POA: Diagnosis not present

## 2015-01-28 DIAGNOSIS — C61 Malignant neoplasm of prostate: Secondary | ICD-10-CM | POA: Diagnosis not present

## 2015-01-29 ENCOUNTER — Ambulatory Visit
Admission: RE | Admit: 2015-01-29 | Discharge: 2015-01-29 | Disposition: A | Discharge status: Home / Self Care | Payer: Medicare Other | Source: Ambulatory Visit | Attending: Radiation Oncology | Admitting: Radiation Oncology

## 2015-01-29 ENCOUNTER — Ambulatory Visit: Payer: Medicare Other

## 2015-01-29 DIAGNOSIS — Z510 Encounter for antineoplastic radiation therapy: Secondary | ICD-10-CM | POA: Diagnosis not present

## 2015-01-29 DIAGNOSIS — C61 Malignant neoplasm of prostate: Secondary | ICD-10-CM | POA: Diagnosis not present

## 2015-01-30 ENCOUNTER — Ambulatory Visit: Payer: Medicare Other

## 2015-01-30 ENCOUNTER — Ambulatory Visit
Admission: RE | Admit: 2015-01-30 | Discharge: 2015-01-30 | Disposition: A | Discharge status: Home / Self Care | Payer: Medicare Other | Source: Ambulatory Visit | Attending: Radiation Oncology | Admitting: Radiation Oncology

## 2015-01-30 DIAGNOSIS — Z510 Encounter for antineoplastic radiation therapy: Secondary | ICD-10-CM | POA: Diagnosis not present

## 2015-01-30 DIAGNOSIS — C61 Malignant neoplasm of prostate: Secondary | ICD-10-CM | POA: Diagnosis not present

## 2015-01-31 ENCOUNTER — Ambulatory Visit: Payer: Medicare Other

## 2015-01-31 ENCOUNTER — Encounter: Payer: Self-pay | Admitting: Radiation Oncology

## 2015-01-31 ENCOUNTER — Ambulatory Visit
Admission: RE | Admit: 2015-01-31 | Discharge: 2015-01-31 | Disposition: A | Discharge status: Home / Self Care | Payer: Medicare Other | Source: Ambulatory Visit | Attending: Radiation Oncology | Admitting: Radiation Oncology

## 2015-01-31 VITALS — BP 136/61 | HR 75 | Resp 16 | Wt 265.7 lb

## 2015-01-31 DIAGNOSIS — Z510 Encounter for antineoplastic radiation therapy: Secondary | ICD-10-CM | POA: Diagnosis not present

## 2015-01-31 DIAGNOSIS — C61 Malignant neoplasm of prostate: Secondary | ICD-10-CM

## 2015-01-31 NOTE — Progress Notes (Addendum)
Weight and vitals stable. Denies pain. Reports nocturia every 2-3 hours during the night. Report occasional difficulty emptying his bladder. Denies urgency. Denies dysuria or hematuria. Describes his urine stream as a dribble. Reports he occasionally is incontinent of urine while sleeping. Denis diarrhea. Denies fatigue. Oriented patient to staff and routine of the clinic. Provided patient with RADIATION THERAPY AND YOU handbook then, reviewed pertinent information. Educated patient reference potential side effects and management such as, fatigue, diarrhea and urinary bladder changes. Answered all patient questions to the best of my ability. Provided patient with my business card and encouraged him to call with needs. Patient verbalized understanding of all reviewed.   BP 136/61 mmHg  Pulse 75  Resp 16  Wt 265 lb 11.2 oz (120.521 kg)  SpO2 100% Wt Readings from Last 3 Encounters:  01/31/15 265 lb 11.2 oz (120.521 kg)  11/04/14 265 lb 9.6 oz (120.475 kg)

## 2015-01-31 NOTE — Progress Notes (Signed)
  Radiation Oncology         [phone]   Name: Wesley Winters MRN: [id number]   Date: 01/31/2015  DOB: 1940/09/11   Weekly Radiation Therapy Management    ICD-9-CM ICD-10-CM   1. Malignant neoplasm of prostate ([place]) 185 C61     Current Dose: 7.2 Gy  Planned Dose:  75 Gy  Narrative The patient presents for routine under treatment assessment.  On review of systems, the patient reports that he is doing pretty well overall. He continues to have episodes of urinary incontinence and occasional dribble of his urinary stream.he reports that this is his baseline. His incontinence is mostly at nighttime sleeping. He notes occasional difficulty emptying his bladder, but denies urgency, dysuria or hematuria.  He denis diarrhea. Denies fatigue. No other complaints or verbalized.  Set-up films were reviewed. The chart was checked.  Physical Findings  weight is 265 lb 11.2 oz (120.521 kg). His blood pressure is 136/61 and his pulse is 75. His respiration is 16 and oxygen saturation is 100%.   Pain Scale 0/10 In general this is an obese Caucasian male in no acute distress. Cardiac pulmonary assessment reveals no acute distress and reveals a normal effort.  Impression Stage T2a, high risk, adenocarcinoma of the prostate with a Gleason's score of 4+5 and a PSA of 11.06  Plan The patient appears to be tolerating his radiotherapy regimen. We will proceed with subsequent treatment and follow up with him next week. He will call if questions or concerns prior to that visit.    The above documentation reflects my direct findings during this shared patient visit. Please see the separate note by Dr. Tammi Klippel on this date for the remainder of the patient's plan of care.  Carola Rhine, PAC   This document serves as a record of services personally performed by Shona Simpson, PAC and  Tyler Pita, MD. It was created on their behalf by Jenell Milliner, a trained medical scribe. The creation of this record is  based on the scribe's personal observations and the provider's statements to them. This document has been checked and approved by the attending provider.

## 2015-01-31 NOTE — Patient Instructions (Signed)
Contact our office if you have any questions following today's appointment: [phone].  

## 2015-02-02 DIAGNOSIS — Z510 Encounter for antineoplastic radiation therapy: Secondary | ICD-10-CM | POA: Insufficient documentation

## 2015-02-02 DIAGNOSIS — N481 Balanitis: Secondary | ICD-10-CM | POA: Insufficient documentation

## 2015-02-02 DIAGNOSIS — Z809 Family history of malignant neoplasm, unspecified: Secondary | ICD-10-CM

## 2015-02-02 DIAGNOSIS — F329 Major depressive disorder, single episode, unspecified: Secondary | ICD-10-CM | POA: Insufficient documentation

## 2015-02-02 DIAGNOSIS — I10 Essential (primary) hypertension: Secondary | ICD-10-CM | POA: Insufficient documentation

## 2015-02-02 DIAGNOSIS — E7800 Pure hypercholesterolemia, unspecified: Secondary | ICD-10-CM | POA: Insufficient documentation

## 2015-02-02 DIAGNOSIS — C61 Malignant neoplasm of prostate: Secondary | ICD-10-CM

## 2015-02-02 DIAGNOSIS — E119 Type 2 diabetes mellitus without complications: Secondary | ICD-10-CM | POA: Insufficient documentation

## 2015-02-03 ENCOUNTER — Ambulatory Visit: Payer: Medicare Other

## 2015-02-03 ENCOUNTER — Ambulatory Visit
Admission: RE | Admit: 2015-02-03 | Discharge: 2015-02-03 | Disposition: A | Discharge status: Home / Self Care | Payer: Medicare Other | Source: Ambulatory Visit | Attending: Radiation Oncology | Admitting: Radiation Oncology

## 2015-02-03 DIAGNOSIS — Z510 Encounter for antineoplastic radiation therapy: Secondary | ICD-10-CM | POA: Diagnosis not present

## 2015-02-04 ENCOUNTER — Ambulatory Visit
Admission: RE | Admit: 2015-02-04 | Discharge: 2015-02-04 | Disposition: A | Discharge status: Home / Self Care | Payer: Medicare Other | Source: Ambulatory Visit | Attending: Radiation Oncology | Admitting: Radiation Oncology

## 2015-02-04 ENCOUNTER — Ambulatory Visit: Payer: Medicare Other

## 2015-02-04 NOTE — Progress Notes (Signed)
Patient presented to nursing following radiation treatment. Patient questions if he can use Desitin in his inguinal folds. Explains he has "used this for years to prevent jock itch." Instructed patient he could continue to use desitin but, he must avoid use four hour prior to radiation treatment or wash it off well before radiation. Patient verbalized his plan to apply at night before bed and after treatment each day. Patient verbalized understanding of all discussed.

## 2015-02-05 ENCOUNTER — Ambulatory Visit
Admission: RE | Admit: 2015-02-05 | Discharge: 2015-02-05 | Disposition: A | Discharge status: Home / Self Care | Payer: Medicare Other | Source: Ambulatory Visit | Attending: Radiation Oncology | Admitting: Radiation Oncology

## 2015-02-05 ENCOUNTER — Ambulatory Visit: Payer: Medicare Other

## 2015-02-05 DIAGNOSIS — C61 Malignant neoplasm of prostate: Secondary | ICD-10-CM | POA: Diagnosis not present

## 2015-02-05 DIAGNOSIS — Z510 Encounter for antineoplastic radiation therapy: Secondary | ICD-10-CM | POA: Diagnosis not present

## 2015-02-05 DIAGNOSIS — E7800 Pure hypercholesterolemia, unspecified: Secondary | ICD-10-CM | POA: Diagnosis not present

## 2015-02-05 DIAGNOSIS — Z809 Family history of malignant neoplasm, unspecified: Secondary | ICD-10-CM | POA: Diagnosis not present

## 2015-02-05 DIAGNOSIS — E119 Type 2 diabetes mellitus without complications: Secondary | ICD-10-CM | POA: Diagnosis not present

## 2015-02-05 DIAGNOSIS — F329 Major depressive disorder, single episode, unspecified: Secondary | ICD-10-CM | POA: Diagnosis not present

## 2015-02-05 DIAGNOSIS — N481 Balanitis: Secondary | ICD-10-CM | POA: Diagnosis not present

## 2015-02-05 DIAGNOSIS — I10 Essential (primary) hypertension: Secondary | ICD-10-CM | POA: Diagnosis not present

## 2015-02-06 ENCOUNTER — Ambulatory Visit: Payer: Medicare Other

## 2015-02-06 ENCOUNTER — Ambulatory Visit
Admission: RE | Admit: 2015-02-06 | Discharge: 2015-02-06 | Disposition: A | Discharge status: Home / Self Care | Payer: Medicare Other | Source: Ambulatory Visit | Attending: Radiation Oncology | Admitting: Radiation Oncology

## 2015-02-06 DIAGNOSIS — Z510 Encounter for antineoplastic radiation therapy: Secondary | ICD-10-CM | POA: Diagnosis not present

## 2015-02-06 NOTE — Addendum Note (Signed)
Encounter addended by: Heywood Footman, RN on: 02/06/2015  9:59 AM<BR>     Documentation filed: Notes Section

## 2015-02-07 ENCOUNTER — Ambulatory Visit
Admission: RE | Admit: 2015-02-07 | Discharge: 2015-02-07 | Disposition: A | Discharge status: Home / Self Care | Payer: Medicare Other | Source: Ambulatory Visit | Attending: Radiation Oncology | Admitting: Radiation Oncology

## 2015-02-07 ENCOUNTER — Ambulatory Visit: Payer: Medicare Other

## 2015-02-07 ENCOUNTER — Encounter: Payer: Self-pay | Admitting: Radiation Oncology

## 2015-02-07 VITALS — BP 113/64 | HR 72 | Temp 97.6°F | Ht 68.0 in | Wt 261.9 lb

## 2015-02-07 DIAGNOSIS — Z510 Encounter for antineoplastic radiation therapy: Secondary | ICD-10-CM | POA: Diagnosis not present

## 2015-02-07 DIAGNOSIS — C61 Malignant neoplasm of prostate: Secondary | ICD-10-CM

## 2015-02-07 NOTE — Progress Notes (Signed)
  Radiation Oncology         [phone]   Name: Wesley Winters MRN: [id number]   Date: 02/07/2015  DOB: Dec 06, 1940     Weekly Radiation Therapy Management    ICD-9-CM ICD-10-CM   1. Malignant neoplasm of prostate ([place]) 185 C61     Current Dose: 16.2 Gy  Planned Dose:  45 Gy  Narrative The patient presents for routine under treatment assessment.  Wesley Winters has received 9 fractions to his pelvis. He reports that he continues to dribbling when not voiding and does not have to strain to initiate stream. Nocturia every 2-21/2 hours. Reports diarrhea today, but is not sure if it is related to treatment or Metformin.   The patient is without complaint. Set-up films were reviewed. The chart was checked.  Physical Findings  height is 5\' 8"  (1.727 m) and weight is 261 lb 14.4 oz (118.797 kg). His temperature is 97.6 F (36.4 C). His blood pressure is 113/64 and his pulse is 72.   Pain scale 0/10  In general this is a well-appearing Caucasian male in no acute distress. Cardiopulmonary assessment is negative for acute distress and he exhibits normal effort.  Impression Adenocarcinoma of prostate.The patient is tolerating radiation.  Plan Continue treatment as planned. The patient will keep [place] informed any changes in his symptoms is incontinence and diarrhea are symptoms he has been dealing with for many years.    The above documentation reflects my direct findings during this shared patient visit. Please see the separate note by Dr. Tammi Klippel on this date for the remainder of the patient's plan of care.  Carola Rhine, PAC   This document serves as a record of services personally performed by Tyler Pita, MD and Shona Simpson, PA-C. It was created on his behalf by Arlyce Harman, a trained medical scribe. The creation of this record is based on the scribe's personal observations and the provider's statements to them. This document has been checked and approved by the attending provider.

## 2015-02-07 NOTE — Progress Notes (Signed)
Mr. 9 fractions to his pelvis.  He reports that he continues to dribbling when not voiding and does not have to strain to initiate stream.  Nocturia every 2-21/2 hours.  Reports diarrhea today, but is not sure if it is related to treatment or Metformin.

## 2015-02-10 ENCOUNTER — Ambulatory Visit
Admission: RE | Admit: 2015-02-10 | Discharge: 2015-02-10 | Disposition: A | Discharge status: Home / Self Care | Payer: Medicare Other | Source: Ambulatory Visit | Attending: Radiation Oncology | Admitting: Radiation Oncology

## 2015-02-10 ENCOUNTER — Ambulatory Visit: Payer: Medicare Other

## 2015-02-10 DIAGNOSIS — Z510 Encounter for antineoplastic radiation therapy: Secondary | ICD-10-CM | POA: Diagnosis not present

## 2015-02-11 ENCOUNTER — Ambulatory Visit: Payer: Medicare Other

## 2015-02-11 ENCOUNTER — Ambulatory Visit
Admission: RE | Admit: 2015-02-11 | Discharge: 2015-02-11 | Disposition: A | Discharge status: Home / Self Care | Payer: Medicare Other | Source: Ambulatory Visit | Attending: Radiation Oncology | Admitting: Radiation Oncology

## 2015-02-11 DIAGNOSIS — C61 Malignant neoplasm of prostate: Secondary | ICD-10-CM | POA: Diagnosis not present

## 2015-02-11 DIAGNOSIS — Z510 Encounter for antineoplastic radiation therapy: Secondary | ICD-10-CM | POA: Diagnosis not present

## 2015-02-12 ENCOUNTER — Ambulatory Visit
Admission: RE | Admit: 2015-02-12 | Discharge: 2015-02-12 | Disposition: A | Discharge status: Home / Self Care | Payer: Medicare Other | Source: Ambulatory Visit | Attending: Radiation Oncology | Admitting: Radiation Oncology

## 2015-02-12 ENCOUNTER — Ambulatory Visit: Payer: Medicare Other

## 2015-02-12 DIAGNOSIS — Z510 Encounter for antineoplastic radiation therapy: Secondary | ICD-10-CM | POA: Diagnosis not present

## 2015-02-13 ENCOUNTER — Ambulatory Visit
Admission: RE | Admit: 2015-02-13 | Discharge: 2015-02-13 | Disposition: A | Discharge status: Home / Self Care | Payer: Medicare Other | Source: Ambulatory Visit | Attending: Radiation Oncology | Admitting: Radiation Oncology

## 2015-02-13 ENCOUNTER — Ambulatory Visit: Payer: Medicare Other

## 2015-02-13 ENCOUNTER — Encounter: Payer: Self-pay | Admitting: Radiation Oncology

## 2015-02-13 VITALS — BP 111/55 | HR 71 | Resp 16 | Wt 259.3 lb

## 2015-02-13 DIAGNOSIS — C61 Malignant neoplasm of prostate: Secondary | ICD-10-CM

## 2015-02-13 DIAGNOSIS — Z510 Encounter for antineoplastic radiation therapy: Secondary | ICD-10-CM | POA: Diagnosis not present

## 2015-02-13 NOTE — Progress Notes (Signed)
  Radiation Oncology         [phone]   Name: Wesley Winters MRN: [id number]   Date: 02/13/2015  DOB: 1940-09-11     Weekly Radiation Therapy Management    ICD-9-CM ICD-10-CM   1. Malignant neoplasm of prostate ([place]) 185 C61     Current Dose: 23.4 Gy  Planned Dose:  45 Gy  Narrative The patient presents for routine under treatment assessment.  -Weight and vitals stable. Denies pain. Denies dysuria and hematuria. Reports nocturia every two hours. Reports persistent diarrhea related to effects of metformin. Reports occasional difficulty emptying his bladder. Denies urgency. Reports occasional incontinence while sleeping. Denies fatigue.   The patient is without complaint. Set-up films were reviewed. The chart was checked.  Physical Findings  weight is 259 lb 4.8 oz (117.618 kg). His blood pressure is 111/55 and his pulse is 71. His respiration is 16 and oxygen saturation is 100%. . Weight essentially stable.  No significant changes.  Impression The patient is tolerating radiation.  Plan Continue treatment as planned.     Sheral Apley Tammi Klippel, M.D.  This document serves as a record of services personally performed by Tyler Pita, MD. It was created on his behalf by Derek Mound, a trained medical scribe. The creation of this record is based on the scribe's personal observations and the provider's statements to them. This document has been checked and approved by the attending provider.

## 2015-02-13 NOTE — Progress Notes (Signed)
Weight and vitals stable. Denies pain. Denies dysuria and hematuria. Reports nocturia every two hours. Reports persistent diarrhea related to effects of metformin. Reports occasional difficulty emptying his bladder. Denies urgency. Reports occasional incontinence while sleeping. Denies fatigue.   BP 111/55 mmHg  Pulse 71  Resp 16  Wt 259 lb 4.8 oz (117.618 kg)  SpO2 100% Wt Readings from Last 3 Encounters:  02/13/15 259 lb 4.8 oz (117.618 kg)  02/07/15 261 lb 14.4 oz (118.797 kg)  01/31/15 265 lb 11.2 oz (120.521 kg)

## 2015-02-14 ENCOUNTER — Ambulatory Visit
Admission: RE | Admit: 2015-02-14 | Discharge: 2015-02-14 | Disposition: A | Discharge status: Home / Self Care | Payer: Medicare Other | Source: Ambulatory Visit | Attending: Radiation Oncology | Admitting: Radiation Oncology

## 2015-02-14 ENCOUNTER — Ambulatory Visit: Payer: Medicare Other

## 2015-02-14 DIAGNOSIS — Z510 Encounter for antineoplastic radiation therapy: Secondary | ICD-10-CM | POA: Diagnosis not present

## 2015-02-14 NOTE — Addendum Note (Signed)
Encounter addended by: Heywood Footman, RN on: 02/14/2015 10:02 AM<BR>     Documentation filed: Medications

## 2015-02-17 ENCOUNTER — Ambulatory Visit
Admission: RE | Admit: 2015-02-17 | Discharge: 2015-02-17 | Disposition: A | Discharge status: Home / Self Care | Payer: Medicare Other | Source: Ambulatory Visit | Attending: Radiation Oncology | Admitting: Radiation Oncology

## 2015-02-17 ENCOUNTER — Ambulatory Visit: Payer: Medicare Other

## 2015-02-17 DIAGNOSIS — Z510 Encounter for antineoplastic radiation therapy: Secondary | ICD-10-CM | POA: Diagnosis not present

## 2015-02-18 ENCOUNTER — Ambulatory Visit: Payer: Medicare Other

## 2015-02-18 ENCOUNTER — Ambulatory Visit
Admission: RE | Admit: 2015-02-18 | Discharge: 2015-02-18 | Disposition: A | Discharge status: Home / Self Care | Payer: Medicare Other | Source: Ambulatory Visit | Attending: Radiation Oncology | Admitting: Radiation Oncology

## 2015-02-18 DIAGNOSIS — Z510 Encounter for antineoplastic radiation therapy: Secondary | ICD-10-CM | POA: Diagnosis not present

## 2015-02-19 ENCOUNTER — Ambulatory Visit: Payer: Medicare Other

## 2015-02-19 ENCOUNTER — Ambulatory Visit
Admission: RE | Admit: 2015-02-19 | Discharge: 2015-02-19 | Disposition: A | Discharge status: Home / Self Care | Payer: Medicare Other | Source: Ambulatory Visit | Attending: Radiation Oncology | Admitting: Radiation Oncology

## 2015-02-19 DIAGNOSIS — Z510 Encounter for antineoplastic radiation therapy: Secondary | ICD-10-CM | POA: Diagnosis not present

## 2015-02-19 DIAGNOSIS — C61 Malignant neoplasm of prostate: Secondary | ICD-10-CM | POA: Diagnosis not present

## 2015-02-20 ENCOUNTER — Ambulatory Visit: Payer: Medicare Other

## 2015-02-20 ENCOUNTER — Ambulatory Visit
Admission: RE | Admit: 2015-02-20 | Discharge: 2015-02-20 | Disposition: A | Discharge status: Home / Self Care | Payer: Medicare Other | Source: Ambulatory Visit | Attending: Radiation Oncology | Admitting: Radiation Oncology

## 2015-02-20 DIAGNOSIS — Z510 Encounter for antineoplastic radiation therapy: Secondary | ICD-10-CM | POA: Diagnosis not present

## 2015-02-21 ENCOUNTER — Ambulatory Visit
Admission: RE | Admit: 2015-02-21 | Discharge: 2015-02-21 | Disposition: A | Discharge status: Home / Self Care | Payer: Medicare Other | Source: Ambulatory Visit | Attending: Radiation Oncology | Admitting: Radiation Oncology

## 2015-02-21 ENCOUNTER — Ambulatory Visit: Payer: Medicare Other

## 2015-02-21 ENCOUNTER — Encounter: Payer: Self-pay | Admitting: Radiation Oncology

## 2015-02-21 VITALS — BP 131/53 | HR 75 | Resp 16 | Wt 261.8 lb

## 2015-02-21 DIAGNOSIS — Z510 Encounter for antineoplastic radiation therapy: Secondary | ICD-10-CM | POA: Diagnosis not present

## 2015-02-21 DIAGNOSIS — C61 Malignant neoplasm of prostate: Secondary | ICD-10-CM

## 2015-02-21 NOTE — Progress Notes (Signed)
Weight and vitals stable. Denies pain. Reports mild dysuria. Denies hematuria. Reports nocturia every two hours. Reports managing diarrhea with Imodium. Reports difficulty emptying his bladder occasionally continues. Denies urgency. Rpeorts occasional incontinence while sleeping. Reports mild fatigue.   BP 131/53 mmHg  Pulse 75  Resp 16  Wt 261 lb 12.8 oz (118.752 kg)  SpO2 100% Wt Readings from Last 3 Encounters:  02/21/15 261 lb 12.8 oz (118.752 kg)  02/13/15 259 lb 4.8 oz (117.618 kg)  02/07/15 261 lb 14.4 oz (118.797 kg)

## 2015-02-21 NOTE — Progress Notes (Signed)
  Radiation Oncology         [phone]   Name: Wesley Winters MRN: [id number]   Date: 02/21/2015  DOB: 1940-10-23     Weekly Radiation Therapy Management    ICD-9-CM ICD-10-CM   1. Malignant neoplasm of prostate ([place]) 185 C61     Current Dose: 34.2 Gy  Planned Dose:  45 Gy  Narrative The patient presents for routine under treatment assessment.  Weight and vitals stable. Denies pain. Reports mild dysuria. Denies hematuria. Reports nocturia every two hours. Reports managing diarrhea with Imodium. Reports difficulty emptying his bladder occasionally continues. Denies urgency. Rpeorts occasional incontinence while sleeping. Reports mild fatigue.   The patient is without complaint. Set-up films were reviewed. The chart was checked.  Physical Findings  weight is 261 lb 12.8 oz (118.752 kg). His blood pressure is 131/53 and his pulse is 75. His respiration is 16 and oxygen saturation is 100%. . Weight essentially stable.  No significant changes.  Impression The patient is tolerating radiation.  Plan Continue treatment as planned.     Sheral Apley Tammi Klippel, M.D.  This document serves as a record of services personally performed by Tyler Pita, MD. It was created on his behalf by Derek Mound, a trained medical scribe. The creation of this record is based on the scribe's personal observations and the provider's statements to them. This document has been checked and approved by the attending provider.

## 2015-02-24 ENCOUNTER — Ambulatory Visit
Admission: RE | Admit: 2015-02-24 | Discharge: 2015-02-24 | Disposition: A | Discharge status: Home / Self Care | Payer: Medicare Other | Source: Ambulatory Visit | Attending: Radiation Oncology | Admitting: Radiation Oncology

## 2015-02-24 ENCOUNTER — Ambulatory Visit: Payer: Medicare Other

## 2015-02-24 DIAGNOSIS — C61 Malignant neoplasm of prostate: Secondary | ICD-10-CM | POA: Diagnosis not present

## 2015-02-24 DIAGNOSIS — Z510 Encounter for antineoplastic radiation therapy: Secondary | ICD-10-CM | POA: Diagnosis not present

## 2015-02-25 ENCOUNTER — Ambulatory Visit
Admission: RE | Admit: 2015-02-25 | Discharge: 2015-02-25 | Disposition: A | Discharge status: Home / Self Care | Payer: Medicare Other | Source: Ambulatory Visit | Attending: Radiation Oncology | Admitting: Radiation Oncology

## 2015-02-25 ENCOUNTER — Ambulatory Visit: Payer: Medicare Other

## 2015-02-25 DIAGNOSIS — Z510 Encounter for antineoplastic radiation therapy: Secondary | ICD-10-CM | POA: Diagnosis not present

## 2015-02-26 ENCOUNTER — Ambulatory Visit: Payer: Medicare Other

## 2015-02-26 ENCOUNTER — Ambulatory Visit
Admission: RE | Admit: 2015-02-26 | Discharge: 2015-02-26 | Disposition: A | Discharge status: Home / Self Care | Payer: Medicare Other | Source: Ambulatory Visit | Attending: Radiation Oncology | Admitting: Radiation Oncology

## 2015-02-26 DIAGNOSIS — Z510 Encounter for antineoplastic radiation therapy: Secondary | ICD-10-CM | POA: Diagnosis not present

## 2015-02-27 ENCOUNTER — Ambulatory Visit
Admission: RE | Admit: 2015-02-27 | Discharge: 2015-02-27 | Disposition: A | Discharge status: Home / Self Care | Payer: Medicare Other | Source: Ambulatory Visit | Attending: Radiation Oncology | Admitting: Radiation Oncology

## 2015-02-27 ENCOUNTER — Ambulatory Visit: Payer: Medicare Other

## 2015-02-27 DIAGNOSIS — Z510 Encounter for antineoplastic radiation therapy: Secondary | ICD-10-CM | POA: Diagnosis not present

## 2015-02-28 ENCOUNTER — Ambulatory Visit
Admission: RE | Admit: 2015-02-28 | Discharge: 2015-02-28 | Disposition: A | Discharge status: Home / Self Care | Payer: Medicare Other | Source: Ambulatory Visit | Attending: Radiation Oncology | Admitting: Radiation Oncology

## 2015-02-28 ENCOUNTER — Ambulatory Visit: Payer: Medicare Other

## 2015-02-28 VITALS — BP 117/46 | HR 77 | Resp 16 | Wt 263.6 lb

## 2015-02-28 DIAGNOSIS — C61 Malignant neoplasm of prostate: Secondary | ICD-10-CM

## 2015-02-28 DIAGNOSIS — Z510 Encounter for antineoplastic radiation therapy: Secondary | ICD-10-CM | POA: Diagnosis not present

## 2015-02-28 NOTE — Progress Notes (Signed)
Weight and vitals stable. Denies pain. Reports mild dysuria. Denies hematuria. Reports nocturia every two hours. Reports managing diarrhea with Imodium. Reports difficulty emptying his bladder occasionally continues. Denies urgency. Rpeorts occasional incontinence while sleeping. Reports mild fatigue.    BP 117/46 mmHg  Pulse 77  Resp 16  Wt 263 lb 9.6 oz (119.568 kg)  SpO2 100% Wt Readings from Last 3 Encounters:  02/28/15 263 lb 9.6 oz (119.568 kg)  02/21/15 261 lb 12.8 oz (118.752 kg)  02/13/15 259 lb 4.8 oz (117.618 kg)

## 2015-02-28 NOTE — Progress Notes (Signed)
  Radiation Oncology         [phone]   Name: Wesley Winters MRN: [id number]   Date: 02/28/2015  DOB: [birth year]     Weekly Radiation Therapy Management    ICD-9-CM ICD-10-CM   1. Malignant neoplasm of prostate ([facility]) 185 C61     Current Dose: 43.2 Gy  Planned Dose:  75 Gy  Narrative The patient presents for routine under treatment assessment.  Weight and vitals stable. Denies pain. Reports mild dysuria. Denies hematuria. Reports nocturia every two hours. Reports managing diarrhea with Imodium. Reports difficulty emptying his bladder occasionally continues. Denies urgency. Reports occasional incontinence while sleeping. Reports mild fatigue.   The patient is without complaint. Set-up films were reviewed. The chart was checked.  Physical Findings  weight is 263 lb 9.6 oz (119.568 kg). His blood pressure is 117/46 and his pulse is 77. His respiration is 16 and oxygen saturation is 100%. . Weight essentially stable.  No significant changes.  Impression The patient is tolerating radiation.  Plan Continue treatment as planned.     Sheral Apley Tammi Klippel, M.D.  This document serves as a record of services personally performed by Tyler Pita, MD. It was created on his behalf by Arlyce Harman, a trained medical scribe. The creation of this record is based on the scribe's personal observations and the provider's statements to them. This document has been checked and approved by the attending provider.

## 2015-03-03 ENCOUNTER — Telehealth: Payer: Self-pay | Admitting: Radiation Oncology

## 2015-03-03 ENCOUNTER — Ambulatory Visit
Admission: RE | Admit: 2015-03-03 | Discharge: 2015-03-03 | Disposition: A | Discharge status: Home / Self Care | Payer: Medicare Other | Source: Ambulatory Visit | Attending: Radiation Oncology | Admitting: Radiation Oncology

## 2015-03-03 ENCOUNTER — Ambulatory Visit: Payer: Medicare Other

## 2015-03-03 DIAGNOSIS — Z510 Encounter for antineoplastic radiation therapy: Secondary | ICD-10-CM | POA: Diagnosis not present

## 2015-03-03 NOTE — Telephone Encounter (Signed)
Placed complete [employer] paperwork in Shona Simpson, PA-C inbox to sign.

## 2015-03-04 ENCOUNTER — Ambulatory Visit
Admission: RE | Admit: 2015-03-04 | Discharge: 2015-03-04 | Disposition: A | Discharge status: Home / Self Care | Payer: Medicare Other | Source: Ambulatory Visit | Attending: Radiation Oncology | Admitting: Radiation Oncology

## 2015-03-04 ENCOUNTER — Ambulatory Visit: Payer: Medicare Other

## 2015-03-04 DIAGNOSIS — C61 Malignant neoplasm of prostate: Secondary | ICD-10-CM | POA: Diagnosis not present

## 2015-03-04 DIAGNOSIS — Z510 Encounter for antineoplastic radiation therapy: Secondary | ICD-10-CM | POA: Diagnosis not present

## 2015-03-05 ENCOUNTER — Ambulatory Visit: Payer: Medicare Other

## 2015-03-05 ENCOUNTER — Ambulatory Visit
Admission: RE | Admit: 2015-03-05 | Discharge: 2015-03-05 | Disposition: A | Discharge status: Home / Self Care | Payer: Medicare Other | Source: Ambulatory Visit | Attending: Radiation Oncology | Admitting: Radiation Oncology

## 2015-03-05 DIAGNOSIS — Z510 Encounter for antineoplastic radiation therapy: Secondary | ICD-10-CM | POA: Insufficient documentation

## 2015-03-05 DIAGNOSIS — C61 Malignant neoplasm of prostate: Secondary | ICD-10-CM | POA: Diagnosis not present

## 2015-03-06 ENCOUNTER — Ambulatory Visit
Admission: RE | Admit: 2015-03-06 | Discharge: 2015-03-06 | Disposition: A | Discharge status: Home / Self Care | Payer: Medicare Other | Source: Ambulatory Visit | Attending: Radiation Oncology | Admitting: Radiation Oncology

## 2015-03-06 ENCOUNTER — Ambulatory Visit: Payer: Medicare Other

## 2015-03-06 DIAGNOSIS — Z510 Encounter for antineoplastic radiation therapy: Secondary | ICD-10-CM | POA: Diagnosis not present

## 2015-03-07 ENCOUNTER — Ambulatory Visit
Admission: RE | Admit: 2015-03-07 | Discharge: 2015-03-07 | Disposition: A | Discharge status: Home / Self Care | Payer: Medicare Other | Source: Ambulatory Visit | Attending: Radiation Oncology | Admitting: Radiation Oncology

## 2015-03-07 ENCOUNTER — Ambulatory Visit: Payer: Medicare Other

## 2015-03-07 ENCOUNTER — Encounter: Payer: Self-pay | Admitting: Radiation Oncology

## 2015-03-07 VITALS — BP 154/64 | HR 82 | Resp 16 | Wt 260.3 lb

## 2015-03-07 DIAGNOSIS — C61 Malignant neoplasm of prostate: Secondary | ICD-10-CM

## 2015-03-07 DIAGNOSIS — Z510 Encounter for antineoplastic radiation therapy: Secondary | ICD-10-CM | POA: Diagnosis not present

## 2015-03-07 NOTE — Progress Notes (Signed)
  Radiation Oncology         [phone]   Name: Wesley Winters MRN: [id number]   Date: 03/07/2015  DOB: [birth year]     Weekly Radiation Therapy Management    ICD-9-CM ICD-10-CM   1. Malignant neoplasm of prostate ([facility]) 185 C61     Current Dose: 53 Gy  Planned Dose:  75 Gy  Narrative The patient presents for routine under treatment assessment.  Weight and vitals stable. Denies pain. Reports mild dysuria unchanged. Denies hematuria. Reports nocturia every two hours. Reports managing diarrhea with Imodium. Reports difficulty emptying his bladder occasionally continues. Denies urgency. Rpeorts occasional incontinence while sleeping. Reports he wobbles when he walks now because he is fatigued.   The patient is without complaint. Set-up films were reviewed. The chart was checked.  Physical Findings  weight is 260 lb 4.8 oz (118.071 kg). His blood pressure is 154/64 and his pulse is 82. His respiration is 16 and oxygen saturation is 100%. . Weight essentially stable.  No significant changes.  Impression The patient is tolerating radiation.  Plan Continue treatment as planned. He has an appointment with Dr. Ruffin Pyo in April.     Sheral Apley Tammi Klippel, M.D.    This document serves as a record of services personally performed by Tyler Pita, MD. It was created on his behalf by Lendon Collar, a trained medical scribe. The creation of this record is based on the scribe's personal observations and the provider's statements to them. This document has been checked and approved by the attending provider.

## 2015-03-07 NOTE — Progress Notes (Signed)
Weight and vitals stable. Denies pain. Reports mild dysuria unchanged. Denies hematuria. Reports nocturia every two hours. Reports managing diarrhea with Imodium. Reports difficulty emptying his bladder occasionally continues. Denies urgency. Rpeorts occasional incontinence while sleeping. Reports he wobbles when he walks now because he is fatigued.   BP 154/64 mmHg  Pulse 82  Resp 16  Wt 260 lb 4.8 oz (118.071 kg)  SpO2 100% Wt Readings from Last 3 Encounters:  03/07/15 260 lb 4.8 oz (118.071 kg)  02/28/15 263 lb 9.6 oz (119.568 kg)  02/21/15 261 lb 12.8 oz (118.752 kg)

## 2015-03-10 ENCOUNTER — Ambulatory Visit: Payer: Medicare Other

## 2015-03-10 ENCOUNTER — Ambulatory Visit
Admission: RE | Admit: 2015-03-10 | Discharge: 2015-03-10 | Disposition: A | Discharge status: Home / Self Care | Payer: Medicare Other | Source: Ambulatory Visit | Attending: Radiation Oncology | Admitting: Radiation Oncology

## 2015-03-10 DIAGNOSIS — Z510 Encounter for antineoplastic radiation therapy: Secondary | ICD-10-CM | POA: Diagnosis not present

## 2015-03-11 ENCOUNTER — Ambulatory Visit
Admission: RE | Admit: 2015-03-11 | Discharge: 2015-03-11 | Disposition: A | Discharge status: Home / Self Care | Payer: Medicare Other | Source: Ambulatory Visit | Attending: Radiation Oncology | Admitting: Radiation Oncology

## 2015-03-11 ENCOUNTER — Ambulatory Visit: Payer: Medicare Other

## 2015-03-11 DIAGNOSIS — Z510 Encounter for antineoplastic radiation therapy: Secondary | ICD-10-CM | POA: Diagnosis not present

## 2015-03-12 ENCOUNTER — Ambulatory Visit
Admission: RE | Admit: 2015-03-12 | Discharge: 2015-03-12 | Disposition: A | Discharge status: Home / Self Care | Payer: Medicare Other | Source: Ambulatory Visit | Attending: Radiation Oncology | Admitting: Radiation Oncology

## 2015-03-12 ENCOUNTER — Ambulatory Visit: Payer: Medicare Other

## 2015-03-12 DIAGNOSIS — Z510 Encounter for antineoplastic radiation therapy: Secondary | ICD-10-CM | POA: Diagnosis not present

## 2015-03-13 ENCOUNTER — Ambulatory Visit
Admission: RE | Admit: 2015-03-13 | Discharge: 2015-03-13 | Disposition: A | Discharge status: Home / Self Care | Payer: Medicare Other | Source: Ambulatory Visit | Attending: Radiation Oncology | Admitting: Radiation Oncology

## 2015-03-13 ENCOUNTER — Ambulatory Visit: Payer: Medicare Other

## 2015-03-13 DIAGNOSIS — C61 Malignant neoplasm of prostate: Secondary | ICD-10-CM | POA: Diagnosis not present

## 2015-03-13 DIAGNOSIS — Z510 Encounter for antineoplastic radiation therapy: Secondary | ICD-10-CM | POA: Diagnosis not present

## 2015-03-14 ENCOUNTER — Encounter: Payer: Self-pay | Admitting: Radiation Oncology

## 2015-03-14 ENCOUNTER — Ambulatory Visit
Admission: RE | Admit: 2015-03-14 | Discharge: 2015-03-14 | Disposition: A | Discharge status: Home / Self Care | Payer: Medicare Other | Source: Ambulatory Visit | Attending: Radiation Oncology | Admitting: Radiation Oncology

## 2015-03-14 ENCOUNTER — Ambulatory Visit: Payer: Medicare Other

## 2015-03-14 VITALS — BP 107/45 | HR 75 | Resp 16 | Wt 258.9 lb

## 2015-03-14 DIAGNOSIS — C61 Malignant neoplasm of prostate: Secondary | ICD-10-CM

## 2015-03-14 DIAGNOSIS — Z510 Encounter for antineoplastic radiation therapy: Secondary | ICD-10-CM | POA: Diagnosis not present

## 2015-03-14 NOTE — Progress Notes (Signed)
  Radiation Oncology         [phone]   Name: Wesley Winters MRN: [id number]   Date: 03/14/2015  DOB: 07/01/1940     Weekly Radiation Therapy Management    ICD-9-CM ICD-10-CM   1. Malignant neoplasm of prostate ([facility]) 185 C61     Current Dose: 63 Gy  Planned Dose:  75 Gy  Narrative The patient presents for routine under treatment assessment.  Weight and vitals stable. Denies pain. Reports dysuria. Denies hematuria. Reports nocturia every two hours. Reports managing diarrhea with Imodium. Reports difficulty emptying his bladder occasionally continues. Denies urgency. Reports occasional incontinence while sleeping. Reports fatigue.  Set-up films were reviewed. The chart was checked.  Physical Findings  weight is 258 lb 14.4 oz (117.436 kg). His blood pressure is 107/45 and his pulse is 75. His respiration is 16 and oxygen saturation is 100%. . Weight essentially stable.  No significant changes.  Impression The patient is tolerating radiation.  Plan Continue treatment as planned.      Sheral Apley Tammi Klippel, M.D.    This document serves as a record of services personally performed by Tyler Pita, MD. It was created on his behalf by Arlyce Harman, a trained medical scribe. The creation of this record is based on the scribe's personal observations and the provider's statements to them. This document has been checked and approved by the attending provider.

## 2015-03-14 NOTE — Progress Notes (Signed)
Weight and vitals stable. Denies pain. Reports dysuria. Denies hematuria. Reports nocturia every two hours. Reports managing diarrhea with Imodium. Reports difficulty emptying his bladder occasionally continues. Denies urgency. Reports occasional incontinence while sleeping. Reports fatigue.   BP 107/45 mmHg  Pulse 75  Resp 16  Wt 258 lb 14.4 oz (117.436 kg)  SpO2 100% Wt Readings from Last 3 Encounters:  03/14/15 258 lb 14.4 oz (117.436 kg)  03/07/15 260 lb 4.8 oz (118.071 kg)  02/28/15 263 lb 9.6 oz (119.568 kg)

## 2015-03-17 ENCOUNTER — Ambulatory Visit
Admission: RE | Admit: 2015-03-17 | Discharge: 2015-03-17 | Disposition: A | Discharge status: Home / Self Care | Payer: Medicare Other | Source: Ambulatory Visit | Attending: Radiation Oncology | Admitting: Radiation Oncology

## 2015-03-17 ENCOUNTER — Ambulatory Visit: Payer: Medicare Other

## 2015-03-17 DIAGNOSIS — Z510 Encounter for antineoplastic radiation therapy: Secondary | ICD-10-CM | POA: Diagnosis not present

## 2015-03-18 ENCOUNTER — Ambulatory Visit
Admission: RE | Admit: 2015-03-18 | Discharge: 2015-03-18 | Disposition: A | Discharge status: Home / Self Care | Payer: Medicare Other | Source: Ambulatory Visit | Attending: Radiation Oncology | Admitting: Radiation Oncology

## 2015-03-18 DIAGNOSIS — Z510 Encounter for antineoplastic radiation therapy: Secondary | ICD-10-CM | POA: Diagnosis not present

## 2015-03-18 DIAGNOSIS — C61 Malignant neoplasm of prostate: Secondary | ICD-10-CM | POA: Diagnosis not present

## 2015-03-19 ENCOUNTER — Ambulatory Visit
Admission: RE | Admit: 2015-03-19 | Discharge: 2015-03-19 | Disposition: A | Discharge status: Home / Self Care | Payer: Medicare Other | Source: Ambulatory Visit | Attending: Radiation Oncology | Admitting: Radiation Oncology

## 2015-03-19 DIAGNOSIS — Z510 Encounter for antineoplastic radiation therapy: Secondary | ICD-10-CM | POA: Diagnosis not present

## 2015-03-20 ENCOUNTER — Ambulatory Visit
Admission: RE | Admit: 2015-03-20 | Discharge: 2015-03-20 | Disposition: A | Discharge status: Home / Self Care | Payer: Medicare Other | Source: Ambulatory Visit | Attending: Radiation Oncology | Admitting: Radiation Oncology

## 2015-03-20 DIAGNOSIS — Z510 Encounter for antineoplastic radiation therapy: Secondary | ICD-10-CM | POA: Diagnosis not present

## 2015-03-21 ENCOUNTER — Ambulatory Visit
Admission: RE | Admit: 2015-03-21 | Discharge: 2015-03-21 | Disposition: A | Discharge status: Home / Self Care | Payer: Medicare Other | Source: Ambulatory Visit | Attending: Radiation Oncology | Admitting: Radiation Oncology

## 2015-03-21 ENCOUNTER — Encounter: Payer: Self-pay | Admitting: Radiation Oncology

## 2015-03-21 VITALS — BP 115/65 | HR 78 | Temp 97.6°F | Resp 18 | Ht 68.0 in | Wt 260.2 lb

## 2015-03-21 DIAGNOSIS — Z510 Encounter for antineoplastic radiation therapy: Secondary | ICD-10-CM | POA: Diagnosis not present

## 2015-03-21 DIAGNOSIS — C61 Malignant neoplasm of prostate: Secondary | ICD-10-CM

## 2015-03-21 NOTE — Progress Notes (Signed)
Wesley Winters had received 39 fractions to his pelvis. Weight and vitals stable. Denies pain. Denies hematuria or dysuria. Reports nocturia every two hours. Reports no diarrhea so long as he take two Imodium tablets per day. Denies fatigue.  EOT education given by Inocente Salles, RN. One month F/U card given to see Dr. Osvaldo Human.  To see Dr. Karsten Ro 4/18-17 for labs then,  F/U visit with Ottelin on 4/25 with injection.  BP 115/65 mmHg  Pulse 78  Temp(Src) 97.6 F (36.4 C) (Oral)  Resp 18  Ht 5\' 8"  (1.727 m)  Wt 260 lb 3.2 oz (118.026 kg)  BMI 39.57 kg/m2  SpO2 100% Wt Readings from Last 3 Encounters:  03/21/15 260 lb 3.2 oz (118.026 kg)  03/14/15 258 lb 14.4 oz (117.436 kg)  03/07/15 260 lb 4.8 oz (118.071 kg)

## 2015-03-21 NOTE — Progress Notes (Signed)
  Radiation Oncology         [phone]   Name: Wesley Winters MRN: [id number]   Date: 03/21/2015  DOB: July 25, 1940     Weekly Radiation Therapy Management    ICD-9-CM ICD-10-CM   1. Malignant neoplasm of prostate ([facility]) 185 C61     Current Dose: 73 Gy Planned Dose:  75 Gy  Narrative The patient presents for routine under treatment assessment.  [name] had received 39 fractions to his pelvis. Weight and vitals stable. Denies pain. Denies hematuria or dysuria. Reports nocturia every two hours. Reports no diarrhea so long as he take two Imodium tablets per day. Denies fatigue.  Set-up films were reviewed. The chart was checked.  Physical Findings  height is 5\' 8"  (1.727 m) and weight is 118.026 kg (260 lb 3.2 oz). His oral temperature is 97.6 F (36.4 C). His blood pressure is 115/65 and his pulse is 78. His respiration is 18 and oxygen saturation is 100%. . Weight essentially stable.  No significant changes.  Impression The patient is tolerating radiation.  Plan Continue treatment as planned.      Sheral Apley Tammi Klippel, M.D.    This document serves as a record of services personally performed by Tyler Pita, MD. It was created on his behalf by Lendon Collar, a trained medical scribe. The creation of this record is based on the scribe's personal observations and the provider's statements to them. This document has been checked and approved by the attending provider.

## 2015-03-24 ENCOUNTER — Encounter: Payer: Self-pay | Admitting: Radiation Oncology

## 2015-03-24 ENCOUNTER — Ambulatory Visit
Admission: RE | Admit: 2015-03-24 | Discharge: 2015-03-24 | Disposition: A | Discharge status: Home / Self Care | Payer: Medicare Other | Source: Ambulatory Visit | Attending: Radiation Oncology | Admitting: Radiation Oncology

## 2015-03-24 DIAGNOSIS — Z510 Encounter for antineoplastic radiation therapy: Secondary | ICD-10-CM | POA: Diagnosis not present

## 2015-03-25 DIAGNOSIS — H524 Presbyopia: Secondary | ICD-10-CM | POA: Diagnosis not present

## 2015-03-30 NOTE — Progress Notes (Signed)
  Radiation Oncology         [phone]) [phone] ________________________________  Name: Wesley Winters MRN: [id number]  Date: 03/24/2015  DOB: [birth year]  End of Treatment Note   ICD-9-CM ICD-10-CM    1. Malignant neoplasm of prostate ([place]) 185 C61     DIAGNOSIS: 75 y.o. 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     Indication for treatment:  Curative, Pelvic and Prostatic Radiotherapy       Radiation treatment dates:   01/28/2015-03/24/2015  Site/dose:  1. The prostate, seminal vesicles, and pelvic lymph nodes were initially treated to 45 Gy in 25 fractions of 1.8 Gy  2. The prostate only was boosted to 75 Gy with 15 additional fractions of 2.0 Gy   Beams/energy:  1. The prostate, seminal vesicles, and pelvic lymph nodes were initially treated using VMAT intensity modulated radiotherapy delivering 6 megavolt photons. Image guidance was performed with CB-CT studies prior to each fraction. He was immobilized with a body fix lower extremity mold.  2. the prostate only was boosted using VMAT intensity modulated radiotherapy delivering 6 megavolt photons. Image guidance was performed with CB-CT studies prior to each fraction. He was immobilized with a body fix lower extremity mold.  Narrative: The patient tolerated radiation treatment relatively well.   The patient experienced some minor urinary irritation and modest fatigue.  He did have some diarrhea.  Plan: The patient has completed radiation treatment. He will return to radiation oncology clinic for routine followup in one month. I advised him to call or return sooner if he has any questions or concerns related to his recovery or treatment. ________________________________  Sheral Apley. Tammi Klippel, M.D.

## 2015-04-04 DIAGNOSIS — Z7984 Long term (current) use of oral hypoglycemic drugs: Secondary | ICD-10-CM | POA: Diagnosis not present

## 2015-04-04 DIAGNOSIS — E1142 Type 2 diabetes mellitus with diabetic polyneuropathy: Secondary | ICD-10-CM | POA: Diagnosis not present

## 2015-04-04 DIAGNOSIS — Z79899 Other long term (current) drug therapy: Secondary | ICD-10-CM | POA: Diagnosis not present

## 2015-04-07 DIAGNOSIS — E119 Type 2 diabetes mellitus without complications: Secondary | ICD-10-CM | POA: Diagnosis not present

## 2015-04-07 DIAGNOSIS — F325 Major depressive disorder, single episode, in full remission: Secondary | ICD-10-CM | POA: Diagnosis not present

## 2015-04-07 DIAGNOSIS — C61 Malignant neoplasm of prostate: Secondary | ICD-10-CM | POA: Diagnosis not present

## 2015-04-07 DIAGNOSIS — I10 Essential (primary) hypertension: Secondary | ICD-10-CM | POA: Diagnosis not present

## 2015-04-07 DIAGNOSIS — Z7984 Long term (current) use of oral hypoglycemic drugs: Secondary | ICD-10-CM | POA: Diagnosis not present

## 2015-04-22 DIAGNOSIS — C61 Malignant neoplasm of prostate: Secondary | ICD-10-CM | POA: Diagnosis not present

## 2015-04-24 ENCOUNTER — Encounter: Payer: Self-pay | Admitting: Radiation Oncology

## 2015-04-24 ENCOUNTER — Ambulatory Visit
Admission: RE | Admit: 2015-04-24 | Discharge: 2015-04-24 | Disposition: A | Discharge status: Home / Self Care | Payer: Medicare Other | Source: Ambulatory Visit | Attending: Radiation Oncology | Admitting: Radiation Oncology

## 2015-04-24 VITALS — BP 128/49 | HR 77 | Resp 16 | Wt 261.3 lb

## 2015-04-24 DIAGNOSIS — C61 Malignant neoplasm of prostate: Secondary | ICD-10-CM

## 2015-04-24 NOTE — Progress Notes (Signed)
Weight and vitals stable. Denies pain. Reports nocturia every 1 hour to 1.5. Denies dysuria. Denies hematuria. Reports incomplete emptying of his bladder. Denies urgency. Reports occasional leakage continues.  Reports diarrhea has resolved. Continues Flomax as directed. Scheduled to received another androgen deprivation shot on Tuesday with follow up. Denies fatigue.   BP 128/49 mmHg  Pulse 77  Resp 16  Wt 261 lb 4.8 oz (118.525 kg)  SpO2 100% Wt Readings from Last 3 Encounters:  04/24/15 261 lb 4.8 oz (118.525 kg)  03/21/15 260 lb 3.2 oz (118.026 kg)  03/14/15 258 lb 14.4 oz (117.436 kg)

## 2015-04-24 NOTE — Progress Notes (Signed)
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  Indication for treatment:  Curative, Pelvic and Prostatic Radiotherapy       Radiation treatment dates:   01/28/2015-03/24/2015: 1. The prostate, seminal vesicles, and pelvic lymph nodes were initially treated to 45 Gy in 25 fractions of 1.8 Gy  2. The prostate only was boosted to 75 Gy with 15 additional fractions of 2.0 Gy   Narrative:  The patient returns today for routine follow-up. Of note during the course of his radiotherapy, the patient did have persistent fatigue of this was also present prior to initiating his radiotherapy, he experienced frequency and nocturia, and continues to take tamsulosin 0.4 mg 2 tablets daily. 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 He began Lupron injection in late October/early November 2016, and is scheduled to receive another next Tuesday.  On review of systems, the patient that overall he is doing pretty well. He continues however to experience urinary frequency, urgency, nocturia Q1 to 2 hours, and feels and didn't complete sentences of emptying. He denies any dysuria or hematuria. 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 He denies any chest pain or shortness of breath, fevers or chills. He denies any diarrhea but states that he did experience looseness of stools during the course of radiation, he did have to take Imodium 1 times this week 4 loose bowel movements, but overall feels that his symptoms have improved in the past few weeks. No other complaints or verbalized.  ALLERGIES:  is allergic to  amoxicillin.  Meds: Current Outpatient Prescriptions  Medication Sig Dispense Refill  . amLODipine (NORVASC) 10 MG tablet   2  . aspirin 81 MG tablet Take 81 mg by mouth daily.    Marland Kitchen atorvastatin (LIPITOR) 40 MG tablet Take 40 mg by mouth daily.    Marland Kitchen azelastine (ASTELIN) 0.1 % nasal spray Place into both nostrils 2 (two) times daily. Use in each nostril as directed    . benazepril (LOTENSIN) 20 MG tablet Take 20 mg by mouth daily.    . fexofenadine (ALLEGRA) 180 MG tablet Take 180 mg by mouth daily.    Marland Kitchen FLUoxetine (PROZAC) 10 MG tablet   4  . fluticasone (FLONASE) 50 MCG/ACT nasal spray Place into both nostrils daily.    Marland Kitchen glimepiride (AMARYL) 4 MG tablet Take 4 mg by mouth daily with breakfast.    . hydrochlorothiazide (HYDRODIURIL) 25 MG tablet Take 25 mg by mouth daily.    Marland Kitchen linagliptin (TRADJENTA) 5 MG TABS tablet Take 5 mg by mouth daily.    . metFORMIN (GLUCOPHAGE-XR) 500 MG 24 hr tablet Reported on 02/07/2015  2  . nebivolol (BYSTOLIC) 10 MG tablet Take 10 mg by mouth daily.    . potassium chloride (K-DUR,KLOR-CON) 10 MEQ tablet Take 10 mEq by mouth 2 (two) times daily.    . tamsulosin (FLOMAX) 0.4 MG CAPS capsule Take 0.4 mg by mouth.    . temazepam (RESTORIL) 15 MG capsule Take 15 mg by mouth at bedtime as needed for sleep.    Marland Kitchen triamcinolone cream (KENALOG)  0.1 % Apply 1 application topically 2 (two) times daily.     No current facility-administered medications for this encounter.    Physical Findings:  weight is 261 lb 4.8 oz (118.525 kg). His blood pressure is 128/49 and his pulse is 77. His respiration is 16 and oxygen saturation is 100%.   Pain scale 0/10 In general this is a well appearing Caucasian male in no acute distress. He's alert and oriented x4 and appropriate throughout the examination. Cardiopulmonary assessment is negative for acute distress and he exhibits normal effort.    Lab Findings: Lab Results  Component Value Date   HGB 21.1* 12/22/2009   HCT 62.0*  12/22/2009    Lab Results  Component Value Date   NA 138 12/22/2009   K 3.0* 12/22/2009   GLUCOSE 140* 12/22/2009   BUN 11 12/22/2009   CREATININE 0.9 12/22/2009    Radiographic Findings: No results found.  Impression/Plan: 1. 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 The patient is completed his radiotherapy treatment, we discussed the rationale for moving forward with close follow-up evaluation with Dr. Nancy Fetter he is scheduled to receive his next Lupron injection on Tuesday of next week. We did discuss and review of the long-term effects of radiotherapy, as well as discussed that we will be happy to see him in the future should he have any questions or concerns regarding his previous radiation, though we would release him to routine surveillance with Dr. Karsten Ro  2. Obstructive lower urinary symptoms.  I've advised the patient to consider discussing this further with Dr. Karsten Ro. We did discuss that his ADT should have reduced the size of his prostate, however he still having symptoms despite that, and did have quite a large gland starting out. He is in agreement and will discuss this concern his appointment next Tuesday.    Carola Rhine, PAC  This document serves as a record of services personally performed by Shona Simpson PAC and Tyler Pita, MD. It was created on his behalf by Derek Mound, a trained medical scribe. The creation of this record is based on the scribe's personal observations and the provider's statements to them. This document has been checked and approved by the attending provider.

## 2015-04-25 DIAGNOSIS — E119 Type 2 diabetes mellitus without complications: Secondary | ICD-10-CM | POA: Diagnosis not present

## 2015-04-29 DIAGNOSIS — N401 Enlarged prostate with lower urinary tract symptoms: Secondary | ICD-10-CM | POA: Diagnosis not present

## 2015-04-29 DIAGNOSIS — C61 Malignant neoplasm of prostate: Secondary | ICD-10-CM | POA: Diagnosis not present

## 2015-04-29 DIAGNOSIS — Z0000 Encounter for general adult medical examination without abnormal findings: Secondary | ICD-10-CM | POA: Diagnosis not present

## 2015-04-29 DIAGNOSIS — N138 Other obstructive and reflux uropathy: Secondary | ICD-10-CM | POA: Diagnosis not present

## 2015-05-14 DIAGNOSIS — G4730 Sleep apnea, unspecified: Secondary | ICD-10-CM | POA: Diagnosis not present

## 2015-05-14 DIAGNOSIS — G4733 Obstructive sleep apnea (adult) (pediatric): Secondary | ICD-10-CM | POA: Diagnosis not present

## 2015-06-20 DIAGNOSIS — E1149 Type 2 diabetes mellitus with other diabetic neurological complication: Secondary | ICD-10-CM | POA: Diagnosis not present

## 2015-07-31 DIAGNOSIS — E119 Type 2 diabetes mellitus without complications: Secondary | ICD-10-CM | POA: Diagnosis not present

## 2015-08-08 DIAGNOSIS — Z7984 Long term (current) use of oral hypoglycemic drugs: Secondary | ICD-10-CM | POA: Diagnosis not present

## 2015-08-08 DIAGNOSIS — E119 Type 2 diabetes mellitus without complications: Secondary | ICD-10-CM | POA: Diagnosis not present

## 2015-08-11 DIAGNOSIS — E1142 Type 2 diabetes mellitus with diabetic polyneuropathy: Secondary | ICD-10-CM | POA: Diagnosis not present

## 2015-08-11 DIAGNOSIS — I10 Essential (primary) hypertension: Secondary | ICD-10-CM | POA: Diagnosis not present

## 2015-08-11 DIAGNOSIS — Z7984 Long term (current) use of oral hypoglycemic drugs: Secondary | ICD-10-CM | POA: Diagnosis not present

## 2015-08-11 DIAGNOSIS — R2242 Localized swelling, mass and lump, left lower limb: Secondary | ICD-10-CM | POA: Diagnosis not present

## 2015-08-21 DIAGNOSIS — G4733 Obstructive sleep apnea (adult) (pediatric): Secondary | ICD-10-CM | POA: Diagnosis not present

## 2015-08-21 DIAGNOSIS — G4730 Sleep apnea, unspecified: Secondary | ICD-10-CM | POA: Diagnosis not present

## 2015-08-28 ENCOUNTER — Ambulatory Visit: Payer: Self-pay | Admitting: General Surgery

## 2015-08-28 DIAGNOSIS — R2242 Localized swelling, mass and lump, left lower limb: Secondary | ICD-10-CM | POA: Diagnosis not present

## 2015-08-28 NOTE — H&P (Signed)
Wesley Winters 08/28/2015 2:13 PM Location: [facility] Patient #: [id number] DOB: Apr 28, 1940 Married / Language: Wesley Winters / Race: White Male  History of Present Illness Wesley Hollingshead MD; 08/28/2015 2:51 PM) Patient words: Left leg nodule.  The patient is a 75 year old male.   Note:He is here for follow-up visit of the soft tissue mass of his left lower leg. He completed his prostate cancer treatments in March. He states sometimes the area goes down in size.  Allergies (April Staton, CMA; 08/28/2015 2:14 PM) Amoxicillin *PENICILLINS* No Known Drug Allergies 08/28/2015 (Marked as Inactive)  Medication History (April Staton, CMA; 08/28/2015 2:14 PM) AmLODIPine Besylate (10MG  Tablet, Oral) Active. Atorvastatin Calcium (40MG  Tablet, Oral) Active. Benazepril HCl (20MG  Tablet, Oral) Active. Glimepiride (4MG  Tablet, Oral) Active. Tamsulosin HCl (0.4MG  Capsule, Oral) Active. MetFORMIN HCl ER (500MG  Tablet ER 24HR, Oral) Active. Klor-Con M10 (10MEQ Tablet ER, Oral) Active. Tradjenta (5MG  Tablet, Oral) Active. Hydrochlorothiazide (25MG  Tablet, Oral) Active. Temazepam (15MG  Capsule, Oral) Active. Aspirin (81MG  Tablet, Oral) Active. FLUoxetine HCl (10MG  Tablet, Oral) Active. Flonase (50MCG/ACT Suspension, Nasal) Active. Azelastine HCl (0.1% Solution, Nasal) Active. Triamcinolone Acetonide (0.1% Cream, External two times daily) Active. Medications Reconciled    Vitals (April Staton CMA; 08/28/2015 2:15 PM) 08/28/2015 2:14 PM Weight: 253.13 lb Height: 68in Height was reported by patient. Body Surface Area: 2.26 m Body Mass Index: 38.49 kg/m  Temp.: 97.79F(Oral)  P.OX: 97% (Room air) BP: 138/60 (Sitting, Left Arm, Standard)      Physical Exam Wesley Hollingshead MD; 08/28/2015 2:51 PM)  The physical exam findings are as follows: Note:General-obese male no acute distress.  Musculoskeletal-5 cm slightly mobile mass left lower leg medial  aspect. No redness. No drainage.    Assessment & Plan Wesley Hollingshead MD; 08/28/2015 2:54 PM)  LOWER LEG MASS, LEFT (R22.42) Impression: Differential diagnosis includes a walled off hematoma that is persistent, other soft tissue neoplasm cannot be ruled out however.  Plan: Biopsy and possible removal of left lower leg soft tissue mass. Procedure and risks discussed with him. Risks include but not limited to bleeding, infection, wound healing cramps, recurrence, nerve damage. I have asked him to stop his aspirin 5 days before the procedure. He seems to understand all this and would like to proceed. I told him if I saw a fleshy soft tissue mass, I would just do a biopsy and then wait for the final pathology before doing anything more.  Jackolyn Confer, MD

## 2015-09-09 DIAGNOSIS — E1142 Type 2 diabetes mellitus with diabetic polyneuropathy: Secondary | ICD-10-CM | POA: Diagnosis not present

## 2015-09-09 DIAGNOSIS — Z23 Encounter for immunization: Secondary | ICD-10-CM | POA: Diagnosis not present

## 2015-09-09 DIAGNOSIS — S46812A Strain of other muscles, fascia and tendons at shoulder and upper arm level, left arm, initial encounter: Secondary | ICD-10-CM | POA: Diagnosis not present

## 2015-09-09 DIAGNOSIS — Z7984 Long term (current) use of oral hypoglycemic drugs: Secondary | ICD-10-CM | POA: Diagnosis not present

## 2015-09-16 ENCOUNTER — Encounter (HOSPITAL_BASED_OUTPATIENT_CLINIC_OR_DEPARTMENT_OTHER): Payer: Self-pay | Admitting: *Deleted

## 2015-09-16 ENCOUNTER — Encounter (HOSPITAL_BASED_OUTPATIENT_CLINIC_OR_DEPARTMENT_OTHER)
Admission: RE | Admit: 2015-09-16 | Discharge: 2015-09-16 | Disposition: A | Discharge status: Home / Self Care | Payer: Medicare Other | Source: Ambulatory Visit | Attending: General Surgery | Admitting: General Surgery

## 2015-09-16 ENCOUNTER — Other Ambulatory Visit: Payer: Self-pay

## 2015-09-16 DIAGNOSIS — Z7984 Long term (current) use of oral hypoglycemic drugs: Secondary | ICD-10-CM | POA: Insufficient documentation

## 2015-09-16 DIAGNOSIS — R2242 Localized swelling, mass and lump, left lower limb: Secondary | ICD-10-CM | POA: Insufficient documentation

## 2015-09-16 DIAGNOSIS — Z880 Allergy status to penicillin: Secondary | ICD-10-CM | POA: Diagnosis not present

## 2015-09-16 DIAGNOSIS — Z7982 Long term (current) use of aspirin: Secondary | ICD-10-CM | POA: Diagnosis not present

## 2015-09-16 DIAGNOSIS — Z01810 Encounter for preprocedural cardiovascular examination: Secondary | ICD-10-CM | POA: Insufficient documentation

## 2015-09-16 DIAGNOSIS — Z01812 Encounter for preprocedural laboratory examination: Secondary | ICD-10-CM | POA: Insufficient documentation

## 2015-09-16 DIAGNOSIS — Z79899 Other long term (current) drug therapy: Secondary | ICD-10-CM | POA: Diagnosis not present

## 2015-09-16 LAB — CBC WITH DIFFERENTIAL/PLATELET
Basophils Absolute: 0 10*3/uL (ref 0.0–0.1)
Basophils Relative: 1 %
Eosinophils Absolute: 0.1 10*3/uL (ref 0.0–0.7)
Eosinophils Relative: 2 %
HEMATOCRIT: 41.9 % (ref 39.0–52.0)
HEMOGLOBIN: 13.3 g/dL (ref 13.0–17.0)
LYMPHS ABS: 1.6 10*3/uL (ref 0.7–4.0)
Lymphocytes Relative: 22 %
MCH: 29.2 pg (ref 26.0–34.0)
MCHC: 31.7 g/dL (ref 30.0–36.0)
MCV: 91.9 fL (ref 78.0–100.0)
MONO ABS: 0.6 10*3/uL (ref 0.1–1.0)
MONOS PCT: 9 %
NEUTROS ABS: 4.7 10*3/uL (ref 1.7–7.7)
NEUTROS PCT: 66 %
Platelets: 189 10*3/uL (ref 150–400)
RBC: 4.56 MIL/uL (ref 4.22–5.81)
RDW: 14.8 % (ref 11.5–15.5)
WBC: 7.1 10*3/uL (ref 4.0–10.5)

## 2015-09-16 LAB — COMPREHENSIVE METABOLIC PANEL
ALK PHOS: 49 U/L (ref 38–126)
ALT: 26 U/L (ref 17–63)
ANION GAP: 14 (ref 5–15)
AST: 43 U/L — ABNORMAL HIGH (ref 15–41)
Albumin: 3.7 g/dL (ref 3.5–5.0)
BILIRUBIN TOTAL: 0.5 mg/dL (ref 0.3–1.2)
BUN: 16 mg/dL (ref 6–20)
CALCIUM: 9.2 mg/dL (ref 8.9–10.3)
CO2: 27 mmol/L (ref 22–32)
Chloride: 97 mmol/L — ABNORMAL LOW (ref 101–111)
Creatinine, Ser: 1.12 mg/dL (ref 0.61–1.24)
GLUCOSE: 226 mg/dL — AB (ref 65–99)
POTASSIUM: 3.3 mmol/L — AB (ref 3.5–5.1)
Sodium: 138 mmol/L (ref 135–145)
TOTAL PROTEIN: 7.1 g/dL (ref 6.5–8.1)

## 2015-09-16 NOTE — Progress Notes (Signed)
   09/16/15 0854  OBSTRUCTIVE SLEEP APNEA  Have you ever been diagnosed with sleep apnea through a sleep study? Yes  If yes, do you have and use a CPAP or BPAP machine every night? 1  Do you know the presssure settings on your maching? Yes  Do you snore loudly (loud enough to be heard through closed doors)?  0  Do you often feel tired, fatigued, or sleepy during the daytime (such as falling asleep during driving or talking to someone)? 0  Has anyone observed you stop breathing during your sleep? 0  Do you have, or are you being treated for high blood pressure? 1  BMI more than 35 kg/m2? 1  Age > 50 (1-yes) 1  Neck circumference greater than:Male 16 inches or larger, Male 17inches or larger? 0  Male Gender (Yes=1) 1  Obstructive Sleep Apnea Score 4

## 2015-09-16 NOTE — Progress Notes (Signed)
ekg reviewed by Dr Gifford Shave , also records from 05 ,  No further treatment needed

## 2015-09-18 NOTE — Progress Notes (Signed)
Pt called this am states Dr Laurann Montana wanted to see ekg, faxed ekg to office and called left message for nurse to call back for more info.

## 2015-09-22 ENCOUNTER — Other Ambulatory Visit: Payer: Self-pay | Admitting: Internal Medicine

## 2015-09-22 ENCOUNTER — Ambulatory Visit (HOSPITAL_BASED_OUTPATIENT_CLINIC_OR_DEPARTMENT_OTHER): Admission: RE | Admit: 2015-09-22 | Payer: Medicare Other | Source: Ambulatory Visit | Admitting: General Surgery

## 2015-09-22 DIAGNOSIS — R9431 Abnormal electrocardiogram [ECG] [EKG]: Secondary | ICD-10-CM

## 2015-09-22 HISTORY — DX: Atherosclerotic heart disease of native coronary artery without angina pectoris: I25.10

## 2015-09-22 SURGERY — EXCISION MASS
Anesthesia: Choice | Site: Leg Lower | Laterality: Left

## 2015-10-03 ENCOUNTER — Ambulatory Visit (HOSPITAL_COMMUNITY): Payer: Medicare Other | Attending: Cardiology

## 2015-10-03 ENCOUNTER — Other Ambulatory Visit: Payer: Self-pay

## 2015-10-03 DIAGNOSIS — R29898 Other symptoms and signs involving the musculoskeletal system: Secondary | ICD-10-CM | POA: Diagnosis not present

## 2015-10-03 DIAGNOSIS — I509 Heart failure, unspecified: Secondary | ICD-10-CM | POA: Diagnosis not present

## 2015-10-03 DIAGNOSIS — R9431 Abnormal electrocardiogram [ECG] [EKG]: Secondary | ICD-10-CM | POA: Diagnosis not present

## 2015-10-15 ENCOUNTER — Other Ambulatory Visit: Payer: Self-pay | Admitting: Internal Medicine

## 2015-10-15 DIAGNOSIS — R0789 Other chest pain: Secondary | ICD-10-CM

## 2015-10-16 ENCOUNTER — Telehealth (HOSPITAL_COMMUNITY): Payer: Self-pay | Admitting: *Deleted

## 2015-10-16 ENCOUNTER — Telehealth: Payer: Self-pay | Admitting: *Deleted

## 2015-10-16 NOTE — Telephone Encounter (Signed)
Left message on voicemail in reference to upcoming appointment scheduled for 10/21/15. Phone number given for a call back so details instructions can be given. Hubbard Robinson, RN

## 2015-10-16 NOTE — Telephone Encounter (Signed)
Follow Up:; ° ° °Returning your call. °

## 2015-10-20 ENCOUNTER — Telehealth (HOSPITAL_COMMUNITY): Payer: Self-pay | Admitting: Radiology

## 2015-10-20 NOTE — Telephone Encounter (Signed)
Patient given detailed instructions per Myocardial Perfusion Study Information Sheet for the test on 10/21/2015 at 7:30. Patient notified to arrive 15 minutes early and that it is imperative to arrive on time for appointment to keep from having the test rescheduled.  If you need to cancel or reschedule your appointment, please call the office within 24 hours of your appointment. Failure to do so may result in a cancellation of your appointment, and a $50 no show fee. Patient verbalized understanding.EHK

## 2015-10-21 ENCOUNTER — Ambulatory Visit (HOSPITAL_COMMUNITY): Payer: Medicare Other | Attending: Cardiology

## 2015-10-21 DIAGNOSIS — R0789 Other chest pain: Secondary | ICD-10-CM | POA: Diagnosis not present

## 2015-10-21 LAB — MYOCARDIAL PERFUSION IMAGING
CHL CUP NUCLEAR SDS: 1
CHL CUP RESTING HR STRESS: 63 {beats}/min
LHR: 0.4
NUC STRESS TID: 1.18
Peak HR: 75 {beats}/min
SRS: 9
SSS: 10

## 2015-10-21 MED ORDER — TECHNETIUM TC 99M TETROFOSMIN IV KIT
30.0000 | PACK | Freq: Once | INTRAVENOUS | Status: AC | PRN
  Administered 2015-10-21: 30 via INTRAVENOUS
  Filled 2015-10-21: qty 30

## 2015-10-21 MED ORDER — REGADENOSON 0.4 MG/5ML IV SOLN
0.4000 mg | Freq: Once | INTRAVENOUS | Status: AC
  Administered 2015-10-21: 0.4 mg via INTRAVENOUS

## 2015-10-21 MED ORDER — TECHNETIUM TC 99M TETROFOSMIN IV KIT
10.9000 | PACK | Freq: Once | INTRAVENOUS | Status: AC | PRN
  Administered 2015-10-21: 10.9 via INTRAVENOUS
  Filled 2015-10-21: qty 11

## 2015-10-23 DIAGNOSIS — C61 Malignant neoplasm of prostate: Secondary | ICD-10-CM | POA: Diagnosis not present

## 2015-10-30 ENCOUNTER — Encounter: Payer: Self-pay | Admitting: Cardiology

## 2015-10-30 DIAGNOSIS — E119 Type 2 diabetes mellitus without complications: Secondary | ICD-10-CM | POA: Diagnosis not present

## 2015-10-30 NOTE — Progress Notes (Signed)
Cardiology Office Note  NEW PATIENT VISIT  Date:  10/31/2015   ID:  Wesley Winters, DOB 07-09-1940, MRN [id number]  PCP:  Irven Shelling, MD  Cardiologist:  NEW    Dr. Tamala Julian  Chief Complaint  Patient presents with  . Pre-op Exam      History of Present Illness: Wesley Winters is a 75 y.o. male who presents for pre-op exam.  He has hx of CAD with stent to LAD and RCA proximal and mid stents in 1998 and 1999 then cardiac cath in 2005 with patent coronary arteries. EF > 70%,   Pt needs biopsy and possible removal of LL leg soft tissue mass. Pt had abnormal EKG and has had Echo and nuc. Stress test with lexisan. Pt with freq PVCs.  Also area of infarct and area of peri infarct ischemia.   Echo with  EF of 45-50%, G2DD, mild MR, akinesis of basal/mid inferolateral wall mild LAE and elevated LV filling pressure.    Other hx includes DM-2, GERD, IBS, depression, OSA with CPAP. Morbid obesity.   Has burning in his chest but since back on nexium the burning has improved, it moves around from chest and abd.  None with walking or climbing steps. Burning increased after meals but gone before bed.  .     Past Medical History:  Diagnosis Date  . Balanitis   . Bladder calculus   . Coronary artery disease    stents x3 by Dr Tamala Julian 1998  . Depression   . Diabetes mellitus without complication ([place])   . GERD (gastroesophageal reflux disease)   . Hypercholesterolemia   . Hypertension   . Phimosis   . Prostate cancer ([place])   . Sleep apnea    wears CPAP nightly    Past Surgical History:  Procedure Laterality Date  . CIRCUMCISION    . CYSTOSCOPY    . HERNIA REPAIR     umbilical  . PROSTATE BIOPSY    . PROSTATE BIOPSY    . PROSTATE BIOPSY    . three cardiac stents       Current Outpatient Prescriptions  Medication Sig Dispense Refill  . amLODipine (NORVASC) 10 MG tablet Take 10 mg by mouth daily.   2  . aspirin 81 MG tablet Take 81 mg by mouth daily.    Marland Kitchen atorvastatin  (LIPITOR) 40 MG tablet Take 40 mg by mouth daily.    Marland Kitchen azelastine (ASTELIN) 0.1 % nasal spray Place into both nostrils 2 (two) times daily. Use in each nostril as directed    . esomeprazole (NEXIUM) 20 MG capsule Take 20 mg by mouth daily at 12 noon.    . fexofenadine (ALLEGRA) 180 MG tablet Take 180 mg by mouth daily.    . fluticasone (FLONASE) 50 MCG/ACT nasal spray Place into both nostrils daily.    Marland Kitchen glimepiride (AMARYL) 4 MG tablet Take 4 mg by mouth daily with breakfast.    . hydrochlorothiazide (HYDRODIURIL) 25 MG tablet Take 25 mg by mouth daily.    Marland Kitchen linagliptin (TRADJENTA) 5 MG TABS tablet Take 5 mg by mouth daily.    . metFORMIN (GLUCOPHAGE-XR) 500 MG 24 hr tablet Take 2 tablets by mouth twice daily  2  . nebivolol (BYSTOLIC) 10 MG tablet Take 10 mg by mouth daily.    . potassium chloride (K-DUR,KLOR-CON) 10 MEQ tablet Take 10 mEq by mouth 2 (two) times daily.    . tamsulosin (FLOMAX) 0.4 MG CAPS capsule Take 0.4 mg by  mouth.    . triamcinolone cream (KENALOG) 0.1 % Apply 1 application topically 2 (two) times daily.     No current facility-administered medications for this visit.     Allergies:   Amoxicillin    Social History:  The patient  reports that he has never smoked. He has never used smokeless tobacco. He reports that he does not drink alcohol or use drugs.   Family History:  The patient's family history includes Arrhythmia in his brother and mother; Cancer in his brother; Heart disease in his brother and mother.    ROS:  General:no colds or fevers, no weight changes Skin:no rashes or ulcers HEENT:no blurred vision, no congestion CV:see HPI PUL:see HPI GI:no diarrhea constipation or melena, no indigestion GU:no hematuria, no dysuria MS:no joint pain, no claudication Neuro:no syncope, no lightheadedness Endo:+ diabetes-2, no thyroid disease  Wt Readings from Last 3 Encounters:  10/31/15 249 lb (112.9 kg)  04/24/15 261 lb 4.8 oz (118.5 kg)  03/21/15 260 lb 3.2  oz (118 kg)     PHYSICAL EXAM: VS:  BP 104/70   Pulse 86   Ht 5\' 8"  (1.727 m)   Wt 249 lb (112.9 kg)   SpO2 96%   BMI 37.86 kg/m  , BMI Body mass index is 37.86 kg/m. General:Pleasant affect, NAD Skin:Warm and dry, brisk capillary refill HEENT:normocephalic, sclera clear, mucus membranes moist Neck:supple, no JVD, no bruits  Heart:S1S2 RRR without murmur, gallup, rub or click Lungs:clear without rales, rhonchi, or wheezes ESP:QZRA, non tender, + BS, do not palpate liver spleen or masses Ext:no lower ext edema, 2+ pedal pulses, 2+ radial pulses Neuro:alert and oriented, MAE, follows commands, + facial symmetry    EKG:  EKG is ordered today. The ekg ordered today demonstrates SR with LAD, septal Mi, PVCs and aberrantly conducted [facility] then PVC.  Reviewed with Dr. Radford Pax.       Recent Labs: 09/16/2015: ALT 26; BUN 16; Creatinine, Ser 1.12; Hemoglobin 13.3; Platelets 189; Potassium 3.3; Sodium 138    Lipid Panel No results found for: CHOL, TRIG, HDL, CHOLHDL, VLDL, LDLCALC, LDLDIRECT     Other studies Reviewed: Additional studies/ records that were reviewed today include:see above, nuc and Echo .   ASSESSMENT AND PLAN:  1. Abnormal EKG but no acute changes   2. Abnormal lexiscan myoview and echo. With old MI, I reviewed with Dr. Radford Pax and no acute areas of ischemia, pt is low risk with known CAD and mildly decreased EF.  Will have him follow up with Dr. Tamala Julian in 3-4 months. He will call if any increase SOB or chest pain.    3. Need for surgery  Risk moderate with scars on nuc study and akinesis on Echo though without symptoms of CHF or angina. Discussed with Dr. Radford Pax. [place] for surgery.  4. Prostate cancer. Has had radiation.    5. DM-2  6.  OSA with CPAP.   Current medicines are reviewed with the patient today.  The patient Has no concerns regarding medicines.  The following changes have been made:  See above Labs/ tests ordered today include:see  above  Disposition:   FU:  see above  Signed, Cecilie Kicks, NP  10/31/2015 9:26 AM    [facility], [place], [place], [place] Phone: [phone]; Fax: [phone]

## 2015-10-31 ENCOUNTER — Encounter: Payer: Self-pay | Admitting: Cardiology

## 2015-10-31 ENCOUNTER — Ambulatory Visit (INDEPENDENT_AMBULATORY_CARE_PROVIDER_SITE_OTHER): Payer: Medicare Other | Admitting: Cardiology

## 2015-10-31 VITALS — BP 104/70 | HR 86 | Ht 68.0 in | Wt 249.0 lb

## 2015-10-31 DIAGNOSIS — Z01810 Encounter for preprocedural cardiovascular examination: Secondary | ICD-10-CM | POA: Diagnosis not present

## 2015-10-31 DIAGNOSIS — I10 Essential (primary) hypertension: Secondary | ICD-10-CM

## 2015-10-31 DIAGNOSIS — Z136 Encounter for screening for cardiovascular disorders: Secondary | ICD-10-CM | POA: Diagnosis not present

## 2015-10-31 DIAGNOSIS — I2510 Atherosclerotic heart disease of native coronary artery without angina pectoris: Secondary | ICD-10-CM

## 2015-10-31 DIAGNOSIS — K219 Gastro-esophageal reflux disease without esophagitis: Secondary | ICD-10-CM

## 2015-10-31 NOTE — Patient Instructions (Signed)
**Note De-Identified Wesley Winters Obfuscation** Medication Instructions:  Same-No changes  Labwork: None  Testing/Procedures: None  Follow-Up: Your physician recommends that you schedule a follow-up appointment in: 3 to 4 months      If you need a refill on your cardiac medications before your next appointment, please call your pharmacy.

## 2015-11-04 DIAGNOSIS — C61 Malignant neoplasm of prostate: Secondary | ICD-10-CM | POA: Diagnosis not present

## 2015-11-04 DIAGNOSIS — R3121 Asymptomatic microscopic hematuria: Secondary | ICD-10-CM | POA: Diagnosis not present

## 2015-11-05 DIAGNOSIS — N200 Calculus of kidney: Secondary | ICD-10-CM | POA: Diagnosis not present

## 2015-11-05 DIAGNOSIS — R3121 Asymptomatic microscopic hematuria: Secondary | ICD-10-CM | POA: Diagnosis not present

## 2015-11-06 DIAGNOSIS — L309 Dermatitis, unspecified: Secondary | ICD-10-CM | POA: Diagnosis not present

## 2015-11-06 DIAGNOSIS — L821 Other seborrheic keratosis: Secondary | ICD-10-CM | POA: Diagnosis not present

## 2015-11-06 DIAGNOSIS — Z85828 Personal history of other malignant neoplasm of skin: Secondary | ICD-10-CM | POA: Diagnosis not present

## 2015-11-14 DIAGNOSIS — G4733 Obstructive sleep apnea (adult) (pediatric): Secondary | ICD-10-CM | POA: Diagnosis not present

## 2015-11-18 DIAGNOSIS — N200 Calculus of kidney: Secondary | ICD-10-CM | POA: Diagnosis not present

## 2015-11-18 DIAGNOSIS — N281 Cyst of kidney, acquired: Secondary | ICD-10-CM | POA: Diagnosis not present

## 2015-11-18 DIAGNOSIS — Z8546 Personal history of malignant neoplasm of prostate: Secondary | ICD-10-CM | POA: Diagnosis not present

## 2015-11-18 DIAGNOSIS — R3121 Asymptomatic microscopic hematuria: Secondary | ICD-10-CM | POA: Diagnosis not present

## 2016-01-15 DIAGNOSIS — E782 Mixed hyperlipidemia: Secondary | ICD-10-CM | POA: Diagnosis not present

## 2016-01-15 DIAGNOSIS — Z0000 Encounter for general adult medical examination without abnormal findings: Secondary | ICD-10-CM | POA: Diagnosis not present

## 2016-01-15 DIAGNOSIS — R2242 Localized swelling, mass and lump, left lower limb: Secondary | ICD-10-CM | POA: Diagnosis not present

## 2016-01-15 DIAGNOSIS — K219 Gastro-esophageal reflux disease without esophagitis: Secondary | ICD-10-CM | POA: Diagnosis not present

## 2016-01-15 DIAGNOSIS — C61 Malignant neoplasm of prostate: Secondary | ICD-10-CM | POA: Diagnosis not present

## 2016-01-15 DIAGNOSIS — E1142 Type 2 diabetes mellitus with diabetic polyneuropathy: Secondary | ICD-10-CM | POA: Diagnosis not present

## 2016-01-15 DIAGNOSIS — D126 Benign neoplasm of colon, unspecified: Secondary | ICD-10-CM | POA: Diagnosis not present

## 2016-01-15 DIAGNOSIS — Z1389 Encounter for screening for other disorder: Secondary | ICD-10-CM | POA: Diagnosis not present

## 2016-01-15 DIAGNOSIS — I10 Essential (primary) hypertension: Secondary | ICD-10-CM | POA: Diagnosis not present

## 2016-01-29 DIAGNOSIS — E119 Type 2 diabetes mellitus without complications: Secondary | ICD-10-CM | POA: Diagnosis not present

## 2016-02-22 DIAGNOSIS — E785 Hyperlipidemia, unspecified: Secondary | ICD-10-CM | POA: Insufficient documentation

## 2016-02-22 DIAGNOSIS — I2510 Atherosclerotic heart disease of native coronary artery without angina pectoris: Secondary | ICD-10-CM | POA: Insufficient documentation

## 2016-02-23 ENCOUNTER — Encounter: Payer: Self-pay | Admitting: Interventional Cardiology

## 2016-02-23 ENCOUNTER — Ambulatory Visit (INDEPENDENT_AMBULATORY_CARE_PROVIDER_SITE_OTHER): Payer: Medicare Other | Admitting: Interventional Cardiology

## 2016-02-23 VITALS — BP 110/68 | HR 94 | Ht 68.0 in | Wt 250.4 lb

## 2016-02-23 DIAGNOSIS — I2510 Atherosclerotic heart disease of native coronary artery without angina pectoris: Secondary | ICD-10-CM | POA: Diagnosis not present

## 2016-02-23 DIAGNOSIS — I10 Essential (primary) hypertension: Secondary | ICD-10-CM

## 2016-02-23 DIAGNOSIS — E785 Hyperlipidemia, unspecified: Secondary | ICD-10-CM | POA: Diagnosis not present

## 2016-02-23 DIAGNOSIS — E1110 Type 2 diabetes mellitus with ketoacidosis without coma: Secondary | ICD-10-CM

## 2016-02-23 DIAGNOSIS — E1310 Other specified diabetes mellitus with ketoacidosis without coma: Secondary | ICD-10-CM

## 2016-02-23 NOTE — Progress Notes (Signed)
Cardiology Office Note    Date:  02/23/2016   ID:  Wesley Winters, DOB 09/28/1940, MRN [id number]  PCP:  Wesley Shelling, MD  Cardiologist: Wesley Grooms, MD   Chief Complaint  Patient presents with  . Coronary Artery Disease    History of Present Illness:  Wesley Winters is a 76 y.o. male has hx of CAD with stent to LAD and RCA proximal and mid stents in 1998 and 1999 then cardiac cath in 2005 with patent coronary arteries. EF > 70%,   No exertional chest discomfort or symptoms that concern him for the possibility of heart related problems. All in complaint is that of reflux outlined below. He denies orthopnea, PND, and edema. He has not had syncope. He is less active than previous but this is related to "aging" according to the patient. He denies side effects to the current medical regimen which is extensive. Only vascular therapy includes aspirin 81 mg per day, atorvastatin 40 mg daily, hydrochlorothiazide 25 mg per day, nebivolol 5 mg daily.  Though we cleared him for left foot orthopedic surgery in October 2017, the procedure was not performed. There is apparently no future date set.  Past Medical History:  Diagnosis Date  . Balanitis   . Bladder calculus   . Coronary artery disease    stents x3 by Dr Wesley Winters 1998  . Depression   . Diabetes mellitus without complication ([place])   . GERD (gastroesophageal reflux disease)   . Hypercholesterolemia   . Hypertension   . Phimosis   . Prostate cancer ([place])   . Sleep apnea    wears CPAP nightly    Past Surgical History:  Procedure Laterality Date  . CIRCUMCISION    . CYSTOSCOPY    . HERNIA REPAIR     umbilical  . PROSTATE BIOPSY    . PROSTATE BIOPSY    . PROSTATE BIOPSY    . three cardiac stents      Current Medications: Outpatient Medications Prior to Visit  Medication Sig Dispense Refill  . amLODipine (NORVASC) 10 MG tablet Take 10 mg by mouth daily.   2  . aspirin 81 MG tablet Take 81 mg by mouth daily.    Marland Kitchen  atorvastatin (LIPITOR) 40 MG tablet Take 40 mg by mouth daily.    Marland Kitchen azelastine (ASTELIN) 0.1 % nasal spray Place into both nostrils 2 (two) times daily. Use in each nostril as directed    . fexofenadine (ALLEGRA) 180 MG tablet Take 180 mg by mouth daily.    . fluticasone (FLONASE) 50 MCG/ACT nasal spray Place into both nostrils daily.    Marland Kitchen glimepiride (AMARYL) 4 MG tablet Take 4 mg by mouth daily with breakfast.    . hydrochlorothiazide (HYDRODIURIL) 25 MG tablet Take 25 mg by mouth daily.    Marland Kitchen linagliptin (TRADJENTA) 5 MG TABS tablet Take 5 mg by mouth daily.    . metFORMIN (GLUCOPHAGE-XR) 500 MG 24 hr tablet Take 2 tablets by mouth twice daily  2  . nebivolol (BYSTOLIC) 10 MG tablet Take 5 mg by mouth daily.     . potassium chloride (K-DUR,KLOR-CON) 10 MEQ tablet Take 10 mEq by mouth daily.     . tamsulosin (FLOMAX) 0.4 MG CAPS capsule Take 0.4 mg by mouth daily after supper.     . triamcinolone cream (KENALOG) 0.1 % Apply 1 application topically 2 (two) times daily.    Marland Kitchen esomeprazole (NEXIUM) 20 MG capsule Take 20 mg by mouth daily  at 12 noon.     No facility-administered medications prior to visit.      Allergies:   Amoxicillin   Social History   Social History  . Marital status: Married    Spouse name: N/A  . Number of children: N/A  . Years of education: N/A   Social History Main Topics  . Smoking status: Never Smoker  . Smokeless tobacco: Never Used  . Alcohol use No  . Drug use: No  . Sexual activity: Yes   Other Topics Concern  . None   Social History Narrative  . None     Family History:  The patient's family history includes Arrhythmia in his brother and mother; Cancer in his brother; Heart disease in his brother and mother.   ROS:   Please see the history of present illness.    Cough, reflux of gastric contents if he lies down too soon after eating  All other systems reviewed and are negative.   PHYSICAL EXAM:   VS:  BP 110/68 (BP Location: Left Arm)    Pulse 94   Ht 5\' 8"  (1.727 m)   Wt 250 lb 6.4 oz (113.6 kg)   BMI 38.07 kg/m    GEN: Well nourished, well developed, in no acute distress . Marked abdominal obesity. HEENT: normal  Neck: no JVD, carotid bruits, or masses Cardiac: RRR; no murmurs, rubs, or gallops,no edema  Respiratory:  clear to auscultation bilaterally, normal work of breathing GI: soft, nontender, nondistended, + BS MS: no deformity or atrophy  Skin: warm and dry, no rash Neuro:  Alert and Oriented x 3, Strength and sensation are intact Psych: euthymic mood, full affect  Wt Readings from Last 3 Encounters:  02/23/16 250 lb 6.4 oz (113.6 kg)  10/31/15 249 lb (112.9 kg)  04/24/15 261 lb 4.8 oz (118.5 kg)      Studies/Labs Reviewed:   EKG:  EKG  Not performed. Most recent tracing from October 2017 is reviewed.  Recent Labs: 09/16/2015: ALT 26; BUN 16; Creatinine, Ser 1.12; Hemoglobin 13.3; Platelets 189; Potassium 3.3; Sodium 138   Lipid Panel No results found for: CHOL, TRIG, HDL, CHOLHDL, VLDL, LDLCALC, LDLDIRECT  Additional studies/ records that were reviewed today include:  Stress myocardial perfusion study 10/21/15 Study Highlights    There was no ST segment deviation noted during stress.  There is a medium defect of moderate severity present in the apical inferior, apical lateral and apex location. The defect is non-reversible and consistent with prior infart.  There is a small defect of moderate severity present in the basal inferolateral and mid inferolateral location. The defect is partially reversible and consistent with scar and peri infarct ischemia.  Study not gated due to frequent PVCs.  This is an intermediate risk study.      ASSESSMENT:    1. Essential hypertension   2. CAD in native artery   3. Hyperlipidemia LDL goal <70   4. Uncontrolled type 2 diabetes mellitus with ketoacidosis without coma, without long-term current use of insulin ([place])      PLAN:  In order of problems  listed above:  1. Adequate control. 2 g sodium diet is discussed. 2. Asymptomatic. Follow closely. Nuclear study performed in October did not reveal any significant regions of ischemia. 3. LDL target is less than 70. This is followed by primary care. He is currently on moderate dose Lipitor.  Plan to reinstitute longitudinal cardiovascular follow-up as he has multiple risk factors and is becoming progressively less  Medication Adjustments/Labs and Tests Ordered: Current medicines are reviewed at length with the patient today.  Concerns regarding medicines are outlined above.  Medication changes, Labs and Tests ordered today are listed in the Patient Instructions below. Patient Instructions  Medication Instructions:  None  Labwork: None  Testing/Procedures: None  Follow-Up: Your physician wants you to follow-up in: 1 year with Dr. Tamala Winters.  You will receive a reminder letter in the mail two months in advance. If you don't receive a letter, please call our office to schedule the follow-up appointment.   Any Other Special Instructions Will Be Listed Below (If Applicable).     If you need a refill on your cardiac medications before your next appointment, please call your pharmacy.      Signed, Wesley Grooms, MD  02/23/2016 1:25 PM    [facility] [place], [place], [place]  [address] Phone: [phone]; Fax: [phone]

## 2016-02-23 NOTE — Patient Instructions (Signed)

## 2016-03-02 DIAGNOSIS — G4733 Obstructive sleep apnea (adult) (pediatric): Secondary | ICD-10-CM | POA: Diagnosis not present

## 2016-03-17 DIAGNOSIS — G4733 Obstructive sleep apnea (adult) (pediatric): Secondary | ICD-10-CM | POA: Diagnosis not present

## 2016-04-21 ENCOUNTER — Ambulatory Visit (INDEPENDENT_AMBULATORY_CARE_PROVIDER_SITE_OTHER): Payer: Medicare Other | Admitting: Podiatry

## 2016-04-21 DIAGNOSIS — L03031 Cellulitis of right toe: Secondary | ICD-10-CM

## 2016-04-21 MED ORDER — DOXYCYCLINE HYCLATE 100 MG PO TABS: 100.0000 mg | ORAL_TABLET | Freq: Two times a day (BID) | ORAL | 0 refills | Status: DC

## 2016-04-21 NOTE — Progress Notes (Signed)
   Subjective:    Patient ID: Wesley Winters, male    DOB: October 30, 1940, 76 y.o.   MRN: [id number]  HPI   This patient presents today stating that he caught his right second toenail under the lever arm of the ventilation vent on 04/16/2016, resulting in the sloughing of the second right toenail. He says that he's been applying topical antibiotic ointment and a Band-Aid daily since this occurred. He describes no progression of swelling, redness other than what was noted after the initial event. He describes a right second toenails being extremely thickened and deformed. He presents with the nail in a plastic bag  Patient states is a diabetic since 2002 and denies history of claudication, amputation Patient former smoker discontinue 1987    Review of Systems  Gastrointestinal: Positive for diarrhea.  Genitourinary: Positive for frequency.  Skin: Positive for rash.  Hematological: Bruises/bleeds easily.  All other systems reviewed and are negative.      Objective:   Physical Exam  Orientated 3  Vascular: DP pulse right 1/4 right 2/4 left PT pulses 1/4 bilaterally Capillary reflex immediate bilaterally  Neurological: Sensation to 10 g monofilament wire intact 0/5 bilaterally Vibratory sensation nonreactive bilaterally Ankle reflex weakly reactive bilaterally  Dermatological: No open skin lesions bilaterally Atrophic skin with absent hair growth bilaterally Crusting second right toenail area with low-grade edema and erythema posterior nail fold. No active drainage, malodor or warmth in the area. The nail that was traumatically avulsed and the plastic bag shows deformity with texture and color changes  Musculoskeletal: Hammertoe second right and third left Manual motor testing dorsi flexion, plantar flexion, inversion, eversion 5/5 bilaterally      Assessment & Plan:   Assessment: Diabetic with decreased peripheral pulses Diabetic peripheral neuropathy Low-grade paronychia  second right toe versus residual inflammation for traumatic nail avulsion  Plan: Patient instructed to continue applying topical antibiotic and ointment daily and cover with Band-Aid second right toe Rx doxycycline 100 mg by mouth twice a day 7 days Patient instructed to observe second right toe and if he noticed any sudden increase of pain, swelling, redness, warmth present to the emergency department Patient return the local swelling redness does not resolve with oral antibiotics and topical antibiotics  Reappoint at patient's request

## 2016-04-21 NOTE — Patient Instructions (Signed)
Apply topical antibiotic ointment and a Band-Aid daily to the second right toe until the local swelling resolves If you notice any sudden increase in pain, swelling, redness present to emergency department Complete antibiotics as prescribed  Diabetes and Foot Care Diabetes may cause you to have problems because of poor blood supply (circulation) to your feet and legs. This may cause the skin on your feet to become thinner, break easier, and heal more slowly. Your skin may become dry, and the skin may peel and crack. You may also have nerve damage in your legs and feet causing decreased feeling in them. You may not notice minor injuries to your feet that could lead to infections or more serious problems. Taking care of your feet is one of the most important things you can do for yourself. Follow these instructions at home:  Wear shoes at all times, even in the house. Do not go barefoot. Bare feet are easily injured.  Check your feet daily for blisters, cuts, and redness. If you cannot see the bottom of your feet, use a mirror or ask someone for help.  Wash your feet with warm water (do not use hot water) and mild soap. Then pat your feet and the areas between your toes until they are completely dry. Do not soak your feet as this can dry your skin.  Apply a moisturizing lotion or petroleum jelly (that does not contain alcohol and is unscented) to the skin on your feet and to dry, brittle toenails. Do not apply lotion between your toes.  Trim your toenails straight across. Do not dig under them or around the cuticle. File the edges of your nails with an emery board or nail file.  Do not cut corns or calluses or try to remove them with medicine.  Wear clean socks or stockings every day. Make sure they are not too tight. Do not wear knee-high stockings since they may decrease blood flow to your legs.  Wear shoes that fit properly and have enough cushioning. To break in new shoes, wear them for just  a few hours a day. This prevents you from injuring your feet. Always look in your shoes before you put them on to be sure there are no objects inside.  Do not cross your legs. This may decrease the blood flow to your feet.  If you find a minor scrape, cut, or break in the skin on your feet, keep it and the skin around it clean and dry. These areas may be cleansed with mild soap and water. Do not cleanse the area with peroxide, alcohol, or iodine.  When you remove an adhesive bandage, be sure not to damage the skin around it.  If you have a wound, look at it several times a day to make sure it is healing.  Do not use heating pads or hot water bottles. They may burn your skin. If you have lost feeling in your feet or legs, you may not know it is happening until it is too late.  Make sure your health care provider performs a complete foot exam at least annually or more often if you have foot problems. Report any cuts, sores, or bruises to your health care provider immediately. Contact a health care provider if:  You have an injury that is not healing.  You have cuts or breaks in the skin.  You have an ingrown nail.  You notice redness on your legs or feet.  You feel burning or tingling in  your legs or feet.  You have pain or cramps in your legs and feet.  Your legs or feet are numb.  Your feet always feel cold. Get help right away if:  There is increasing redness, swelling, or pain in or around a wound.  There is a red line that goes up your leg.  Pus is coming from a wound.  You develop a fever or as directed by your health care provider.  You notice a bad smell coming from an ulcer or wound. This information is not intended to replace advice given to you by your health care provider. Make sure you discuss any questions you have with your health care provider. Document Released: 12/19/1999 Document Revised: 05/29/2015 Document Reviewed: 05/30/2012 Elsevier Interactive Patient  Education  2017 [employer].

## 2016-04-22 ENCOUNTER — Encounter: Payer: Self-pay | Admitting: Podiatry

## 2016-04-22 DIAGNOSIS — L570 Actinic keratosis: Secondary | ICD-10-CM | POA: Diagnosis not present

## 2016-04-29 DIAGNOSIS — E119 Type 2 diabetes mellitus without complications: Secondary | ICD-10-CM | POA: Diagnosis not present

## 2016-05-18 DIAGNOSIS — C61 Malignant neoplasm of prostate: Secondary | ICD-10-CM | POA: Diagnosis not present

## 2016-05-18 DIAGNOSIS — Z8546 Personal history of malignant neoplasm of prostate: Secondary | ICD-10-CM | POA: Diagnosis not present

## 2016-05-20 DIAGNOSIS — E1142 Type 2 diabetes mellitus with diabetic polyneuropathy: Secondary | ICD-10-CM | POA: Diagnosis not present

## 2016-05-20 DIAGNOSIS — I10 Essential (primary) hypertension: Secondary | ICD-10-CM | POA: Diagnosis not present

## 2016-05-20 DIAGNOSIS — E1165 Type 2 diabetes mellitus with hyperglycemia: Secondary | ICD-10-CM | POA: Diagnosis not present

## 2016-05-20 DIAGNOSIS — R2242 Localized swelling, mass and lump, left lower limb: Secondary | ICD-10-CM | POA: Diagnosis not present

## 2016-05-25 DIAGNOSIS — R8279 Other abnormal findings on microbiological examination of urine: Secondary | ICD-10-CM | POA: Diagnosis not present

## 2016-05-25 DIAGNOSIS — Z8546 Personal history of malignant neoplasm of prostate: Secondary | ICD-10-CM | POA: Diagnosis not present

## 2016-06-11 DIAGNOSIS — G4733 Obstructive sleep apnea (adult) (pediatric): Secondary | ICD-10-CM | POA: Diagnosis not present

## 2016-07-29 DIAGNOSIS — E119 Type 2 diabetes mellitus without complications: Secondary | ICD-10-CM | POA: Diagnosis not present

## 2016-08-04 DIAGNOSIS — E1142 Type 2 diabetes mellitus with diabetic polyneuropathy: Secondary | ICD-10-CM | POA: Diagnosis not present

## 2016-08-04 DIAGNOSIS — I10 Essential (primary) hypertension: Secondary | ICD-10-CM | POA: Diagnosis not present

## 2016-08-04 DIAGNOSIS — N400 Enlarged prostate without lower urinary tract symptoms: Secondary | ICD-10-CM | POA: Diagnosis not present

## 2016-08-04 DIAGNOSIS — I2510 Atherosclerotic heart disease of native coronary artery without angina pectoris: Secondary | ICD-10-CM | POA: Diagnosis not present

## 2016-08-17 DIAGNOSIS — R2242 Localized swelling, mass and lump, left lower limb: Secondary | ICD-10-CM | POA: Diagnosis not present

## 2016-08-23 ENCOUNTER — Other Ambulatory Visit: Payer: Self-pay | Admitting: General Surgery

## 2016-08-23 DIAGNOSIS — I96 Gangrene, not elsewhere classified: Secondary | ICD-10-CM | POA: Diagnosis not present

## 2016-08-23 DIAGNOSIS — R2242 Localized swelling, mass and lump, left lower limb: Secondary | ICD-10-CM | POA: Diagnosis not present

## 2016-09-22 DIAGNOSIS — E119 Type 2 diabetes mellitus without complications: Secondary | ICD-10-CM | POA: Diagnosis not present

## 2016-09-22 DIAGNOSIS — Z23 Encounter for immunization: Secondary | ICD-10-CM | POA: Diagnosis not present

## 2016-09-22 DIAGNOSIS — K219 Gastro-esophageal reflux disease without esophagitis: Secondary | ICD-10-CM | POA: Diagnosis not present

## 2016-09-22 DIAGNOSIS — I10 Essential (primary) hypertension: Secondary | ICD-10-CM | POA: Diagnosis not present

## 2016-10-19 DIAGNOSIS — G4733 Obstructive sleep apnea (adult) (pediatric): Secondary | ICD-10-CM | POA: Diagnosis not present

## 2016-11-01 DIAGNOSIS — E119 Type 2 diabetes mellitus without complications: Secondary | ICD-10-CM | POA: Diagnosis not present

## 2016-11-04 DIAGNOSIS — L821 Other seborrheic keratosis: Secondary | ICD-10-CM | POA: Diagnosis not present

## 2016-11-04 DIAGNOSIS — L570 Actinic keratosis: Secondary | ICD-10-CM | POA: Diagnosis not present

## 2016-11-15 DIAGNOSIS — G4733 Obstructive sleep apnea (adult) (pediatric): Secondary | ICD-10-CM | POA: Diagnosis not present

## 2016-11-22 DIAGNOSIS — C61 Malignant neoplasm of prostate: Secondary | ICD-10-CM | POA: Diagnosis not present

## 2016-11-30 DIAGNOSIS — Z8546 Personal history of malignant neoplasm of prostate: Secondary | ICD-10-CM | POA: Diagnosis not present

## 2016-11-30 DIAGNOSIS — N200 Calculus of kidney: Secondary | ICD-10-CM | POA: Diagnosis not present

## 2017-01-20 DIAGNOSIS — G4733 Obstructive sleep apnea (adult) (pediatric): Secondary | ICD-10-CM | POA: Diagnosis not present

## 2017-01-27 DIAGNOSIS — K219 Gastro-esophageal reflux disease without esophagitis: Secondary | ICD-10-CM | POA: Diagnosis not present

## 2017-01-27 DIAGNOSIS — E538 Deficiency of other specified B group vitamins: Secondary | ICD-10-CM | POA: Diagnosis not present

## 2017-01-27 DIAGNOSIS — E782 Mixed hyperlipidemia: Secondary | ICD-10-CM | POA: Diagnosis not present

## 2017-01-27 DIAGNOSIS — I10 Essential (primary) hypertension: Secondary | ICD-10-CM | POA: Diagnosis not present

## 2017-01-27 DIAGNOSIS — E1142 Type 2 diabetes mellitus with diabetic polyneuropathy: Secondary | ICD-10-CM | POA: Diagnosis not present

## 2017-01-31 DIAGNOSIS — E119 Type 2 diabetes mellitus without complications: Secondary | ICD-10-CM | POA: Diagnosis not present

## 2017-02-07 DIAGNOSIS — D510 Vitamin B12 deficiency anemia due to intrinsic factor deficiency: Secondary | ICD-10-CM | POA: Diagnosis not present

## 2017-02-08 DIAGNOSIS — D510 Vitamin B12 deficiency anemia due to intrinsic factor deficiency: Secondary | ICD-10-CM | POA: Diagnosis not present

## 2017-02-08 NOTE — Progress Notes (Signed)
Cardiology Office Note    Date:  02/09/2017   ID:  Wesley Winters, DOB 10/27/1940, MRN [id number]  PCP:  [name], MD  Cardiologist: Sinclair Grooms, MD   Chief Complaint  Patient presents with  . Coronary Artery Disease    History of Present Illness:  Wesley Winters is a 77 y.o. male has hx of CAD with stent to LAD and RCA proximal and mid stents in 1998 and 1999 then cardiac cath in 2005 with patent coronary arteries. EF > 70%,    He denies angina.  He has reflux/indigestion.  This gets better with Gaviscon.  It is not exertion related.  He denies orthopnea and PND.  Relatively sedentary.  To start an aerobic water exercise program.   Past Medical History:  Diagnosis Date  . Balanitis   . Bladder calculus   . Coronary artery disease    stents x3 by Dr Tamala Julian 1998  . Depression   . Diabetes mellitus without complication ([place])   . GERD (gastroesophageal reflux disease)   . Hypercholesterolemia   . Hypertension   . Phimosis   . Prostate cancer ([place])   . Sleep apnea    wears CPAP nightly    Past Surgical History:  Procedure Laterality Date  . CIRCUMCISION    . CYSTOSCOPY    . HERNIA REPAIR     umbilical  . PROSTATE BIOPSY    . PROSTATE BIOPSY    . PROSTATE BIOPSY    . three cardiac stents      Current Medications: Outpatient Medications Prior to Visit  Medication Sig Dispense Refill  . amLODipine (NORVASC) 10 MG tablet Take 10 mg by mouth daily.   2  . aspirin 81 MG tablet Take 81 mg by mouth daily.    Marland Kitchen atorvastatin (LIPITOR) 40 MG tablet Take 40 mg by mouth daily.    Marland Kitchen azelastine (ASTELIN) 0.1 % nasal spray Place into both nostrils 2 (two) times daily. Use in each nostril as directed    . benazepril (LOTENSIN) 20 MG tablet Take 20 mg by mouth daily.  3  . Cyanocobalamin (VITAMIN B-12) 1000 MCG/15ML LIQD Take 1 mL by mouth daily.    . fexofenadine (ALLEGRA) 180 MG tablet Take 180 mg by mouth daily.    . fluticasone (FLONASE) 50 MCG/ACT nasal spray Place  into both nostrils daily.    . hydrochlorothiazide (HYDRODIURIL) 25 MG tablet Take 25 mg by mouth daily.    . Insulin Glargine (TOUJEO MAX SOLOSTAR) 300 UNIT/ML SOPN Inject 25 Units into the skin daily.    Marland Kitchen linagliptin (TRADJENTA) 5 MG TABS tablet Take 5 mg by mouth daily.    . metFORMIN (GLUCOPHAGE-XR) 500 MG 24 hr tablet Take 2 tablets by mouth twice daily  2  . nebivolol (BYSTOLIC) 10 MG tablet Take 5 mg by mouth daily.     . pantoprazole (PROTONIX) 40 MG tablet Take 40 mg by mouth daily.    . potassium chloride (K-DUR,KLOR-CON) 10 MEQ tablet Take 10 mEq by mouth daily.     . tamsulosin (FLOMAX) 0.4 MG CAPS capsule Take 0.4 mg by mouth daily after supper.     . triamcinolone cream (KENALOG) 0.1 % Apply 1 application topically 2 (two) times daily.    Marland Kitchen doxycycline (VIBRA-TABS) 100 MG tablet Take 1 tablet (100 mg total) by mouth 2 (two) times daily. (Patient not taking: Reported on 02/09/2017) 14 tablet 0  . glimepiride (AMARYL) 4 MG tablet Take 4 mg  by mouth daily with breakfast.     No facility-administered medications prior to visit.      Allergies:   Amoxicillin   Social History   Socioeconomic History  . Marital status: Married    Spouse name: None  . Number of children: None  . Years of education: None  . Highest education level: None  Social Needs  . Financial resource strain: None  . Food insecurity - worry: None  . Food insecurity - inability: None  . Transportation needs - medical: None  . Transportation needs - non-medical: None  Occupational History  . None  Tobacco Use  . Smoking status: Never Smoker  . Smokeless tobacco: Never Used  Substance and Sexual Activity  . Alcohol use: No  . Drug use: No  . Sexual activity: Yes  Other Topics Concern  . None  Social History Narrative  . None     Family History:  The patient's family history includes Arrhythmia in his brother and mother; Cancer in his brother; Heart disease in his brother and mother.   ROS:     Please see the history of present illness.    Occasional diarrhea, easy bruising, and vision disturbance. All other systems reviewed and are negative.   PHYSICAL EXAM:   VS:  BP 118/62   Pulse 76   Ht 5\' 8"  (1.727 m)   Wt 236 lb 12.8 oz (107.4 kg)   SpO2 96%   BMI 36.01 kg/m    GEN: Well nourished, well developed, in no acute distress.  Elderly and morbidly obese. HEENT: normal  Neck: no JVD, carotid bruits, or masses Cardiac: RRR; no murmurs, rubs, or gallops,no edema  Respiratory:  clear to auscultation bilaterally, normal work of breathing GI: soft, nontender, nondistended, + BS MS: no deformity or atrophy  Skin: warm and dry, no rash Neuro:  Alert and Oriented x 3, Strength and sensation are intact Psych: euthymic mood, full affect  Wt Readings from Last 3 Encounters:  02/09/17 236 lb 12.8 oz (107.4 kg)  02/23/16 250 lb 6.4 oz (113.6 kg)  10/31/15 249 lb (112.9 kg)      Studies/Labs Reviewed:   EKG:  EKG normal sinus rhythm, left anterior hemiblock, anteroseptal Q wave infarction.  Lateral infarction.  When compared to prior tracings, no significant change other than absence of PVCs.  Recent Labs: No results found for requested labs within last 8760 hours.   Lipid Panel No results found for: CHOL, TRIG, HDL, CHOLHDL, VLDL, LDLCALC, LDLDIRECT  Additional studies/ records that were reviewed today include:  Stress nuclear study 2017:  Study Highlights    There was no ST segment deviation noted during stress.  There is a medium defect of moderate severity present in the apical inferior, apical lateral and apex location. The defect is non-reversible and consistent with prior infart.  There is a small defect of moderate severity present in the basal inferolateral and mid inferolateral location. The defect is partially reversible and consistent with scar and peri infarct ischemia.  Study not gated due to frequent PVCs.  This is an intermediate risk study.      ASSESSMENT:    1. CAD in native artery   2. Hyperlipidemia LDL goal <70   3. Type 2 diabetes mellitus with other circulatory complication, without long-term current use of insulin ([place])   4. Essential hypertension      PLAN:  In order of problems listed above:  1. Without angina.  Evidence of prior anterior infarction.  Intermediate nuclear study without evidence of widespread ischemia in 2017.  Asymptomatic since that time.  Plan continue conservative clinical observation.  Risk factor modification. 2. LDL target is less than 70.  Most recent data was acceptable with LDL of 49. 3. Hemoglobin A1c target less than 7.  Being worked on by primary care. 4. Blood pressure target 130/85 mmHg or less.    Medication Adjustments/Labs and Tests Ordered: Current medicines are reviewed at length with the patient today.  Concerns regarding medicines are outlined above.  Medication changes, Labs and Tests ordered today are listed in the Patient Instructions below. Patient Instructions  Medication Instructions:  Your physician recommends that you continue on your current medications as directed. Please refer to the Current Medication list given to you today.  Labwork: None   Testing/Procedures: None   Follow-Up: Your physician wants you to follow-up in: 1 year with Dr. Tamala Julian. You will receive a reminder letter in the mail two months in advance. If you don't receive a letter, please call our office to schedule the follow-up appointment.    Any Other Special Instructions Will Be Listed Below (If Applicable).  Call if you have any chest discomfort or increased shortness of breath.    If you need a refill on your cardiac medications before your next appointment, please call your pharmacy.      Signed, Sinclair Grooms, MD  02/09/2017 10:31 AM    [facility] [place], [place], [place]  [address] Phone: [phone]; Fax: [phone]

## 2017-02-09 ENCOUNTER — Ambulatory Visit: Payer: Medicare Other | Admitting: Interventional Cardiology

## 2017-02-09 ENCOUNTER — Encounter (INDEPENDENT_AMBULATORY_CARE_PROVIDER_SITE_OTHER): Payer: Self-pay

## 2017-02-09 ENCOUNTER — Encounter: Payer: Self-pay | Admitting: Interventional Cardiology

## 2017-02-09 VITALS — BP 118/62 | HR 76 | Ht 68.0 in | Wt 236.8 lb

## 2017-02-09 DIAGNOSIS — I2510 Atherosclerotic heart disease of native coronary artery without angina pectoris: Secondary | ICD-10-CM | POA: Diagnosis not present

## 2017-02-09 DIAGNOSIS — E785 Hyperlipidemia, unspecified: Secondary | ICD-10-CM | POA: Diagnosis not present

## 2017-02-09 DIAGNOSIS — I10 Essential (primary) hypertension: Secondary | ICD-10-CM

## 2017-02-09 DIAGNOSIS — E1159 Type 2 diabetes mellitus with other circulatory complications: Secondary | ICD-10-CM | POA: Diagnosis not present

## 2017-02-09 DIAGNOSIS — D510 Vitamin B12 deficiency anemia due to intrinsic factor deficiency: Secondary | ICD-10-CM | POA: Diagnosis not present

## 2017-02-09 NOTE — Patient Instructions (Addendum)
Medication Instructions:  Your physician recommends that you continue on your current medications as directed. Please refer to the Current Medication list given to you today.  Labwork: None   Testing/Procedures: None   Follow-Up: Your physician wants you to follow-up in: 1 year with Dr. Tamala Julian. You will receive a reminder letter in the mail two months in advance. If you don't receive a letter, please call our office to schedule the follow-up appointment.    Any Other Special Instructions Will Be Listed Below (If Applicable).  Call if you have any chest discomfort or increased shortness of breath.    If you need a refill on your cardiac medications before your next appointment, please call your pharmacy.

## 2017-02-10 DIAGNOSIS — E538 Deficiency of other specified B group vitamins: Secondary | ICD-10-CM | POA: Diagnosis not present

## 2017-02-11 DIAGNOSIS — D510 Vitamin B12 deficiency anemia due to intrinsic factor deficiency: Secondary | ICD-10-CM | POA: Diagnosis not present

## 2017-02-14 DIAGNOSIS — D510 Vitamin B12 deficiency anemia due to intrinsic factor deficiency: Secondary | ICD-10-CM | POA: Diagnosis not present

## 2017-02-21 DIAGNOSIS — D510 Vitamin B12 deficiency anemia due to intrinsic factor deficiency: Secondary | ICD-10-CM | POA: Diagnosis not present

## 2017-02-28 DIAGNOSIS — D510 Vitamin B12 deficiency anemia due to intrinsic factor deficiency: Secondary | ICD-10-CM | POA: Diagnosis not present

## 2017-03-07 DIAGNOSIS — D510 Vitamin B12 deficiency anemia due to intrinsic factor deficiency: Secondary | ICD-10-CM | POA: Diagnosis not present

## 2017-03-29 DIAGNOSIS — H524 Presbyopia: Secondary | ICD-10-CM | POA: Diagnosis not present

## 2017-05-03 DIAGNOSIS — E119 Type 2 diabetes mellitus without complications: Secondary | ICD-10-CM | POA: Diagnosis not present

## 2017-05-04 DIAGNOSIS — G4733 Obstructive sleep apnea (adult) (pediatric): Secondary | ICD-10-CM | POA: Diagnosis not present

## 2017-05-17 DIAGNOSIS — K219 Gastro-esophageal reflux disease without esophagitis: Secondary | ICD-10-CM | POA: Diagnosis not present

## 2017-05-17 DIAGNOSIS — R319 Hematuria, unspecified: Secondary | ICD-10-CM | POA: Diagnosis not present

## 2017-05-17 DIAGNOSIS — E1142 Type 2 diabetes mellitus with diabetic polyneuropathy: Secondary | ICD-10-CM | POA: Diagnosis not present

## 2017-05-17 DIAGNOSIS — I10 Essential (primary) hypertension: Secondary | ICD-10-CM | POA: Diagnosis not present

## 2017-05-31 DIAGNOSIS — C61 Malignant neoplasm of prostate: Secondary | ICD-10-CM | POA: Diagnosis not present

## 2017-06-03 DIAGNOSIS — N390 Urinary tract infection, site not specified: Secondary | ICD-10-CM | POA: Diagnosis not present

## 2017-07-19 ENCOUNTER — Other Ambulatory Visit: Payer: Self-pay | Admitting: Nurse Practitioner

## 2017-07-19 ENCOUNTER — Ambulatory Visit
Admission: RE | Admit: 2017-07-19 | Discharge: 2017-07-19 | Disposition: A | Discharge status: Home / Self Care | Payer: Medicare Other | Source: Ambulatory Visit | Attending: Nurse Practitioner | Admitting: Nurse Practitioner

## 2017-07-19 DIAGNOSIS — K5901 Slow transit constipation: Secondary | ICD-10-CM

## 2017-07-19 DIAGNOSIS — N200 Calculus of kidney: Secondary | ICD-10-CM | POA: Diagnosis not present

## 2017-07-19 DIAGNOSIS — N210 Calculus in bladder: Secondary | ICD-10-CM | POA: Diagnosis not present

## 2017-07-19 DIAGNOSIS — R1084 Generalized abdominal pain: Secondary | ICD-10-CM | POA: Diagnosis not present

## 2017-08-01 DIAGNOSIS — E119 Type 2 diabetes mellitus without complications: Secondary | ICD-10-CM | POA: Diagnosis not present

## 2017-08-02 DIAGNOSIS — Z8546 Personal history of malignant neoplasm of prostate: Secondary | ICD-10-CM | POA: Diagnosis not present

## 2017-08-02 DIAGNOSIS — N200 Calculus of kidney: Secondary | ICD-10-CM | POA: Diagnosis not present

## 2017-08-09 DIAGNOSIS — R1030 Lower abdominal pain, unspecified: Secondary | ICD-10-CM | POA: Diagnosis not present

## 2017-08-09 DIAGNOSIS — K5900 Constipation, unspecified: Secondary | ICD-10-CM | POA: Diagnosis not present

## 2017-08-09 DIAGNOSIS — E876 Hypokalemia: Secondary | ICD-10-CM | POA: Diagnosis not present

## 2017-08-09 DIAGNOSIS — K219 Gastro-esophageal reflux disease without esophagitis: Secondary | ICD-10-CM | POA: Diagnosis not present

## 2017-08-15 DIAGNOSIS — E876 Hypokalemia: Secondary | ICD-10-CM | POA: Diagnosis not present

## 2017-09-01 DIAGNOSIS — G4733 Obstructive sleep apnea (adult) (pediatric): Secondary | ICD-10-CM | POA: Diagnosis not present

## 2017-09-14 DIAGNOSIS — Z23 Encounter for immunization: Secondary | ICD-10-CM | POA: Diagnosis not present

## 2017-09-19 ENCOUNTER — Other Ambulatory Visit: Payer: Self-pay | Admitting: Internal Medicine

## 2017-09-19 DIAGNOSIS — R1030 Lower abdominal pain, unspecified: Secondary | ICD-10-CM | POA: Diagnosis not present

## 2017-09-20 ENCOUNTER — Ambulatory Visit
Admission: RE | Admit: 2017-09-20 | Discharge: 2017-09-20 | Disposition: A | Discharge status: Home / Self Care | Payer: Medicare Other | Source: Ambulatory Visit | Attending: Internal Medicine | Admitting: Internal Medicine

## 2017-09-20 ENCOUNTER — Other Ambulatory Visit: Payer: Self-pay | Admitting: Internal Medicine

## 2017-09-20 DIAGNOSIS — R1030 Lower abdominal pain, unspecified: Secondary | ICD-10-CM

## 2017-09-20 DIAGNOSIS — K219 Gastro-esophageal reflux disease without esophagitis: Secondary | ICD-10-CM | POA: Diagnosis not present

## 2017-09-20 DIAGNOSIS — N200 Calculus of kidney: Secondary | ICD-10-CM | POA: Diagnosis not present

## 2017-09-20 MED ORDER — IOPAMIDOL (ISOVUE-300) INJECTION 61%
125.0000 mL | Freq: Once | INTRAVENOUS | Status: AC | PRN
  Administered 2017-09-20: 125 mL via INTRAVENOUS

## 2017-10-07 DIAGNOSIS — K219 Gastro-esophageal reflux disease without esophagitis: Secondary | ICD-10-CM | POA: Diagnosis not present

## 2017-10-07 DIAGNOSIS — R1084 Generalized abdominal pain: Secondary | ICD-10-CM | POA: Diagnosis not present

## 2017-10-27 DIAGNOSIS — R140 Abdominal distension (gaseous): Secondary | ICD-10-CM | POA: Diagnosis not present

## 2017-10-27 DIAGNOSIS — R194 Change in bowel habit: Secondary | ICD-10-CM | POA: Diagnosis not present

## 2017-10-27 DIAGNOSIS — K219 Gastro-esophageal reflux disease without esophagitis: Secondary | ICD-10-CM | POA: Diagnosis not present

## 2017-11-03 DIAGNOSIS — E119 Type 2 diabetes mellitus without complications: Secondary | ICD-10-CM | POA: Diagnosis not present

## 2017-11-16 DIAGNOSIS — I2510 Atherosclerotic heart disease of native coronary artery without angina pectoris: Secondary | ICD-10-CM | POA: Diagnosis not present

## 2017-11-16 DIAGNOSIS — E1142 Type 2 diabetes mellitus with diabetic polyneuropathy: Secondary | ICD-10-CM | POA: Diagnosis not present

## 2017-11-16 DIAGNOSIS — G4733 Obstructive sleep apnea (adult) (pediatric): Secondary | ICD-10-CM | POA: Diagnosis not present

## 2017-11-16 DIAGNOSIS — I10 Essential (primary) hypertension: Secondary | ICD-10-CM | POA: Diagnosis not present

## 2017-11-17 DIAGNOSIS — L821 Other seborrheic keratosis: Secondary | ICD-10-CM | POA: Diagnosis not present

## 2017-11-17 DIAGNOSIS — L814 Other melanin hyperpigmentation: Secondary | ICD-10-CM | POA: Diagnosis not present

## 2017-11-17 DIAGNOSIS — D1801 Hemangioma of skin and subcutaneous tissue: Secondary | ICD-10-CM | POA: Diagnosis not present

## 2017-11-17 DIAGNOSIS — D229 Melanocytic nevi, unspecified: Secondary | ICD-10-CM | POA: Diagnosis not present

## 2017-11-21 DIAGNOSIS — K219 Gastro-esophageal reflux disease without esophagitis: Secondary | ICD-10-CM | POA: Diagnosis not present

## 2017-11-21 DIAGNOSIS — K5900 Constipation, unspecified: Secondary | ICD-10-CM | POA: Diagnosis not present

## 2017-11-21 DIAGNOSIS — R140 Abdominal distension (gaseous): Secondary | ICD-10-CM | POA: Diagnosis not present

## 2017-12-05 DIAGNOSIS — C61 Malignant neoplasm of prostate: Secondary | ICD-10-CM | POA: Diagnosis not present

## 2017-12-13 DIAGNOSIS — N200 Calculus of kidney: Secondary | ICD-10-CM | POA: Diagnosis not present

## 2017-12-13 DIAGNOSIS — Z8546 Personal history of malignant neoplasm of prostate: Secondary | ICD-10-CM | POA: Diagnosis not present

## 2018-02-02 DIAGNOSIS — E119 Type 2 diabetes mellitus without complications: Secondary | ICD-10-CM | POA: Diagnosis not present

## 2018-02-13 ENCOUNTER — Other Ambulatory Visit: Payer: Self-pay | Admitting: Gastroenterology

## 2018-02-13 DIAGNOSIS — R1032 Left lower quadrant pain: Secondary | ICD-10-CM | POA: Diagnosis not present

## 2018-02-13 DIAGNOSIS — K5909 Other constipation: Secondary | ICD-10-CM | POA: Diagnosis not present

## 2018-02-13 DIAGNOSIS — K219 Gastro-esophageal reflux disease without esophagitis: Secondary | ICD-10-CM | POA: Diagnosis not present

## 2018-02-14 DIAGNOSIS — G4733 Obstructive sleep apnea (adult) (pediatric): Secondary | ICD-10-CM | POA: Diagnosis not present

## 2018-02-17 ENCOUNTER — Ambulatory Visit
Admission: RE | Admit: 2018-02-17 | Discharge: 2018-02-17 | Disposition: A | Discharge status: Home / Self Care | Payer: Medicare Other | Source: Ambulatory Visit | Attending: Gastroenterology | Admitting: Gastroenterology

## 2018-02-17 DIAGNOSIS — R1032 Left lower quadrant pain: Secondary | ICD-10-CM

## 2018-02-17 DIAGNOSIS — N200 Calculus of kidney: Secondary | ICD-10-CM | POA: Diagnosis not present

## 2018-02-17 MED ORDER — IOPAMIDOL (ISOVUE-300) INJECTION 61%
125.0000 mL | Freq: Once | INTRAVENOUS | Status: AC | PRN
  Administered 2018-02-17: 125 mL via INTRAVENOUS

## 2018-03-06 NOTE — Progress Notes (Signed)
Cardiology Office Note:    Date:  03/07/2018   ID:  Wesley Winters, DOB 1940-01-09, MRN [id number]  PCP:  [name], MD  Cardiologist:  Sinclair Grooms, MD   Referring MD: [name], MD   Chief Complaint  Patient presents with  . Coronary Artery Disease  . Chest Pain    History of Present Illness:    Wesley Winters is a 78 y.o. male with a hx of CAD with stent to LAD and RCA proximal and mid stents in 1998 and 1999 then cardiac cath in 2005 with patent coronary arteries. EF >70%,  Wesley Winters has been having difficulty with recurring fullness/burning in the epigastric and retrosternal area that radiates through to his back.  It is usually present when he awakens in the mornings.  It can last hours.  He has some dyspnea on exertion.  He recently had a CT of the abdomen and pelvis performed that did not demonstrate any significant abnormality.  He did have aortic atherosclerosis.  He denies lower extremity swelling, orthopnea, PND, syncope.  He has not had blood in his urine or stool.  Past Medical History:  Diagnosis Date  . Balanitis   . Bladder calculus   . Coronary artery disease    stents x3 by Dr Tamala Julian 1998  . Depression   . Diabetes mellitus without complication ([place])   . GERD (gastroesophageal reflux disease)   . Hypercholesterolemia   . Hypertension   . Phimosis   . Prostate cancer ([place])   . Sleep apnea    wears CPAP nightly    Past Surgical History:  Procedure Laterality Date  . CIRCUMCISION    . CYSTOSCOPY    . HERNIA REPAIR     umbilical  . PROSTATE BIOPSY    . PROSTATE BIOPSY    . PROSTATE BIOPSY    . three cardiac stents      Current Medications: Current Meds  Medication Sig  . amLODipine (NORVASC) 10 MG tablet Take 10 mg by mouth daily.   Marland Kitchen aspirin 81 MG tablet Take 81 mg by mouth daily.  Marland Kitchen atorvastatin (LIPITOR) 40 MG tablet Take 40 mg by mouth daily.  Marland Kitchen azelastine (ASTELIN) 0.1 % nasal spray Place into both nostrils 2 (two) times daily. Use in  each nostril as directed  . benazepril (LOTENSIN) 20 MG tablet Take 20 mg by mouth daily.  . cholecalciferol (VITAMIN D3) 25 MCG (1000 UT) tablet Take 1,000 Units by mouth daily.  . Cyanocobalamin (VITAMIN B-12) 1000 MCG/15ML LIQD Take 1 mL by mouth daily.  . fexofenadine (ALLEGRA) 180 MG tablet Take 180 mg by mouth daily.  . fluticasone (FLONASE) 50 MCG/ACT nasal spray Place into both nostrils daily.  . hydrochlorothiazide (HYDRODIURIL) 25 MG tablet Take 25 mg by mouth daily.  . Insulin Glargine (TOUJEO MAX SOLOSTAR) 300 UNIT/ML SOPN Inject 25 Units into the skin daily.  Marland Kitchen linagliptin (TRADJENTA) 5 MG TABS tablet Take 5 mg by mouth daily.  . metFORMIN (GLUCOPHAGE-XR) 500 MG 24 hr tablet Take 2 tablets by mouth twice daily  . nebivolol (BYSTOLIC) 10 MG tablet Take 5 mg by mouth daily.   . pantoprazole (PROTONIX) 40 MG tablet Take 40 mg by mouth daily.  . polyethylene glycol powder (MIRALAX) powder Take 1 Container by mouth once. One capful at night  . potassium chloride (K-DUR,KLOR-CON) 10 MEQ tablet Take 10 mEq by mouth daily.   . Probiotic Product (PROBIOTIC ADVANCED PO) Take 4 mg by mouth daily.  Marland Kitchen  triamcinolone cream (KENALOG) 0.1 % Apply 1 application topically 2 (two) times daily.     Allergies:   Amoxicillin   Social History   Socioeconomic History  . Marital status: Married    Spouse name: Not on file  . Number of children: Not on file  . Years of education: Not on file  . Highest education level: Not on file  Occupational History  . Not on file  Social Needs  . Financial resource strain: Not on file  . Food insecurity:    Worry: Not on file    Inability: Not on file  . Transportation needs:    Medical: Not on file    Non-medical: Not on file  Tobacco Use  . Smoking status: Never Smoker  . Smokeless tobacco: Never Used  Substance and Sexual Activity  . Alcohol use: No  . Drug use: No  . Sexual activity: Yes  Lifestyle  . Physical activity:    Days per week: Not  on file    Minutes per session: Not on file  . Stress: Not on file  Relationships  . Social connections:    Talks on phone: Not on file    Gets together: Not on file    Attends religious service: Not on file    Active member of club or organization: Not on file    Attends meetings of clubs or organizations: Not on file    Relationship status: Not on file  Other Topics Concern  . Not on file  Social History Narrative  . Not on file     Family History: The patient's family history includes Arrhythmia in his brother and mother; Cancer in his brother; Heart disease in his brother and mother.  ROS:   Please see the history of present illness.    Vision disturbance abdominal pain, diarrhea, back pain, rash, difficulty with balance, headache, nausea, constipation, and cough.  All other systems reviewed and are negative.  EKGs/Labs/Other Studies Reviewed:    The following studies were reviewed today: No new cardiac data  EKG:  EKG normal sinus rhythm, normal PR, decrease R wave progression V1 through V6.  Left anterior hemiblock.  PVC.Marland Kitchen  When compared to the prior tracing from February 2019, no changes noted.  Recent Labs: No results found for requested labs within last 8760 hours.  Recent Lipid Panel No results found for: CHOL, TRIG, HDL, CHOLHDL, VLDL, LDLCALC, LDLDIRECT  Physical Exam:    VS:  BP 132/72   Pulse 65   Ht 5\' 8"  (1.727 m)   Wt 227 lb 1.9 oz (103 kg)   SpO2 96%   BMI 34.53 kg/m     Wt Readings from Last 3 Encounters:  03/07/18 227 lb 1.9 oz (103 kg)  02/09/17 236 lb 12.8 oz (107.4 kg)  02/23/16 250 lb 6.4 oz (113.6 kg)     GEN: Morbid obesity. No acute distress HEENT: Normal NECK: No JVD. LYMPHATICS: No lymphadenopathy CARDIAC: RRR.  No murmur, no gallop, no edema VASCULAR: 2+ bilateral pulses, no bruits RESPIRATORY:  Clear to auscultation without rales, wheezing or rhonchi  ABDOMEN: Soft, non-tender, non-distended, No pulsatile  mass, MUSCULOSKELETAL: No deformity  SKIN: Warm and dry NEUROLOGIC:  Alert and oriented x 3 PSYCHIATRIC:  Normal affect   ASSESSMENT:    1. CAD in native artery   2. Hyperlipidemia LDL goal <70   3. Type 2 diabetes mellitus with other circulatory complication, without long-term current use of insulin ([place])   4. Essential hypertension  PLAN:    In order of problems listed above:  1. Symptoms are atypical for ischemia but given the age of his stents and his persistent complaints without an identifiable GI explanation, we will perform a myocardial perfusion study.  When last done 3 years ago it was low risk with evidence of inferior perfusion abnormality he does have a history of right coronary and LAD first generation drug-eluting stents. 2. LDL target less than 70.  When done 1 year ago it was 76.  Therapy has not changed. 3. Hemoglobin A1c target less than 7.  Most recent A1c in November was 7.4. 4. Target 130/80 mmHg.  Because of atypical but concerning chest discomfort with radiation to the back, a myocardial perfusion study will be done to rule out active ischemia/progression of CAD.  Overall education and awareness concerning primary/secondary risk prevention was discussed in detail: LDL less than 70, hemoglobin A1c less than 7, blood pressure target less than 130/80 mmHg, >150 minutes of moderate aerobic activity per week, avoidance of smoking, weight control (via diet and exercise), and continued surveillance/management of/for obstructive sleep apnea.  Clinical follow-up in 1 year unless significant abnormality is noted on the perfusion study    Medication Adjustments/Labs and Tests Ordered: Current medicines are reviewed at length with the patient today.  Concerns regarding medicines are outlined above.  Orders Placed This Encounter  Procedures  . EKG 12-Lead   No orders of the defined types were placed in this encounter.   Patient Instructions  Medication  Instructions:  Your physician recommends that you continue on your current medications as directed. Please refer to the Current Medication list given to you today.  If you need a refill on your cardiac medications before your next appointment, please call your pharmacy.   Lab work: None If you have labs (blood work) drawn today and your tests are completely normal, you will receive your results only by: Marland Kitchen MyChart Message (if you have MyChart) OR . A paper copy in the mail If you have any lab test that is abnormal or we need to change your treatment, we will call you to review the results.  Testing/Procedures: Your physician has requested that you have a lexiscan myoview. For further information please visit [email or web address]. Please follow instruction sheet, as given.   Follow-Up: At [facility], you and your health needs are our priority.  As part of our continuing mission to provide you with exceptional heart care, we have created designated Provider Care Teams.  These Care Teams include your primary Cardiologist (physician) and Advanced Practice Providers (APPs -  Physician Assistants and Nurse Practitioners) who all work together to provide you with the care you need, when you need it. You will need a follow up appointment in 12 months.  Please call our office 2 months in advance to schedule this appointment.  You may see Sinclair Grooms, MD or one of the following Advanced Practice Providers on your designated Care Team:   Truitt Merle, NP Cecilie Kicks, NP . Kathyrn Drown, NP  Any Other Special Instructions Will Be Listed Below (If Applicable).       Signed, Sinclair Grooms, MD  03/07/2018 4:48 PM    [place]

## 2018-03-07 ENCOUNTER — Encounter: Payer: Self-pay | Admitting: Interventional Cardiology

## 2018-03-07 ENCOUNTER — Ambulatory Visit: Payer: Medicare Other | Admitting: Interventional Cardiology

## 2018-03-07 ENCOUNTER — Encounter: Payer: Self-pay | Admitting: *Deleted

## 2018-03-07 VITALS — BP 132/72 | HR 65 | Ht 68.0 in | Wt 227.1 lb

## 2018-03-07 DIAGNOSIS — E785 Hyperlipidemia, unspecified: Secondary | ICD-10-CM

## 2018-03-07 DIAGNOSIS — I2510 Atherosclerotic heart disease of native coronary artery without angina pectoris: Secondary | ICD-10-CM

## 2018-03-07 DIAGNOSIS — R0789 Other chest pain: Secondary | ICD-10-CM

## 2018-03-07 DIAGNOSIS — E1159 Type 2 diabetes mellitus with other circulatory complications: Secondary | ICD-10-CM | POA: Diagnosis not present

## 2018-03-07 DIAGNOSIS — I10 Essential (primary) hypertension: Secondary | ICD-10-CM | POA: Diagnosis not present

## 2018-03-07 NOTE — Patient Instructions (Addendum)
Medication Instructions:  Your physician recommends that you continue on your current medications as directed. Please refer to the Current Medication list given to you today.  If you need a refill on your cardiac medications before your next appointment, please call your pharmacy.   Lab work: None If you have labs (blood work) drawn today and your tests are completely normal, you will receive your results only by: Marland Kitchen MyChart Message (if you have MyChart) OR . A paper copy in the mail If you have any lab test that is abnormal or we need to change your treatment, we will call you to review the results.  Testing/Procedures: Your physician has requested that you have a lexiscan myoview. For further information please visit [email or web address]. Please follow instruction sheet, as given.   Follow-Up: At [facility], you and your health needs are our priority.  As part of our continuing mission to provide you with exceptional heart care, we have created designated Provider Care Teams.  These Care Teams include your primary Cardiologist (physician) and Advanced Practice Providers (APPs -  Physician Assistants and Nurse Practitioners) who all work together to provide you with the care you need, when you need it. You will need a follow up appointment in 12 months.  Please call our office 2 months in advance to schedule this appointment.  You may see Sinclair Grooms, MD or one of the following Advanced Practice Providers on your designated Care Team:   Truitt Merle, NP Cecilie Kicks, NP . Kathyrn Drown, NP  Any Other Special Instructions Will Be Listed Below (If Applicable).

## 2018-03-07 NOTE — Addendum Note (Signed)
Addended by: Loren Racer on: 03/07/2018 04:51 PM   Modules accepted: Orders

## 2018-03-08 ENCOUNTER — Telehealth (HOSPITAL_COMMUNITY): Payer: Self-pay | Admitting: *Deleted

## 2018-03-08 NOTE — Telephone Encounter (Signed)
Patient's wife, per DPR, given detailed instructions per Myocardial Perfusion Study Information Sheet for the test on 03/13/18. Patient notified to arrive 15 minutes early and that it is imperative to arrive on time for appointment to keep from having the test rescheduled.  If you need to cancel or reschedule your appointment, please call the office within 24 hours of your appointment. . Patient verbalized understanding. Kirstie Peri, RN

## 2018-03-13 ENCOUNTER — Ambulatory Visit (HOSPITAL_COMMUNITY): Payer: Medicare Other | Attending: Cardiovascular Disease

## 2018-03-13 DIAGNOSIS — I2510 Atherosclerotic heart disease of native coronary artery without angina pectoris: Secondary | ICD-10-CM | POA: Insufficient documentation

## 2018-03-13 DIAGNOSIS — R0789 Other chest pain: Secondary | ICD-10-CM

## 2018-03-13 LAB — MYOCARDIAL PERFUSION IMAGING
CHL CUP NUCLEAR SDS: 1
CHL CUP NUCLEAR SRS: 0
CHL CUP NUCLEAR SSS: 1
CSEPPHR: 67 {beats}/min
LV dias vol: 154 mL (ref 62–150)
LV sys vol: 116 mL
Rest HR: 58 {beats}/min
TID: 1.02

## 2018-03-13 MED ORDER — REGADENOSON 0.4 MG/5ML IV SOLN
0.4000 mg | Freq: Once | INTRAVENOUS | Status: AC
  Administered 2018-03-13: 0.4 mg via INTRAVENOUS

## 2018-03-13 MED ORDER — TECHNETIUM TC 99M TETROFOSMIN IV KIT
32.4000 | PACK | Freq: Once | INTRAVENOUS | Status: AC | PRN
  Administered 2018-03-13: 32.4 via INTRAVENOUS
  Filled 2018-03-13: qty 33

## 2018-03-13 MED ORDER — TECHNETIUM TC 99M TETROFOSMIN IV KIT
10.1000 | PACK | Freq: Once | INTRAVENOUS | Status: AC | PRN
  Administered 2018-03-13: 10.1 via INTRAVENOUS
  Filled 2018-03-13: qty 11

## 2018-03-14 ENCOUNTER — Telehealth: Payer: Self-pay | Admitting: *Deleted

## 2018-03-14 DIAGNOSIS — R931 Abnormal findings on diagnostic imaging of heart and coronary circulation: Secondary | ICD-10-CM

## 2018-03-14 NOTE — Telephone Encounter (Signed)
-----   Message from Belva Crome, MD sent at 03/14/2018  5:21 PM EDT ----- Let the patient know the stress test is very abnormal and will require further work-up with coronary angiography to exclude unrecognized heart attacks. First, please repeat the echo to determine if nuclear wall motion study/EF is consistent with Echo. I am happy for him to come in for discussion if needed. A copy will be sent to [name], MD

## 2018-03-14 NOTE — Telephone Encounter (Signed)
Spoke with pt and went over results and recommendations per Dr. Tamala Julian.  Scheduled pt for echo tomorrow and appt to see Dr. Tamala Julian on Thursday.  Sent a message to pre cert in regards to echo.  Pt verbalized understanding and was in agreement with this plan.

## 2018-03-15 ENCOUNTER — Other Ambulatory Visit: Payer: Self-pay

## 2018-03-15 ENCOUNTER — Ambulatory Visit (HOSPITAL_COMMUNITY): Payer: Medicare Other | Attending: Cardiovascular Disease

## 2018-03-15 DIAGNOSIS — R931 Abnormal findings on diagnostic imaging of heart and coronary circulation: Secondary | ICD-10-CM | POA: Diagnosis not present

## 2018-03-15 MED ORDER — PERFLUTREN LIPID MICROSPHERE
1.0000 mL | INTRAVENOUS | Status: AC | PRN
  Administered 2018-03-15: 2 mL via INTRAVENOUS

## 2018-03-16 ENCOUNTER — Ambulatory Visit: Payer: Medicare Other | Admitting: Interventional Cardiology

## 2018-03-16 ENCOUNTER — Encounter: Payer: Self-pay | Admitting: Interventional Cardiology

## 2018-03-16 VITALS — BP 142/68 | HR 68 | Ht 68.0 in | Wt 224.6 lb

## 2018-03-16 DIAGNOSIS — I10 Essential (primary) hypertension: Secondary | ICD-10-CM

## 2018-03-16 DIAGNOSIS — E785 Hyperlipidemia, unspecified: Secondary | ICD-10-CM | POA: Diagnosis not present

## 2018-03-16 DIAGNOSIS — E1159 Type 2 diabetes mellitus with other circulatory complications: Secondary | ICD-10-CM | POA: Diagnosis not present

## 2018-03-16 DIAGNOSIS — I2510 Atherosclerotic heart disease of native coronary artery without angina pectoris: Secondary | ICD-10-CM

## 2018-03-16 DIAGNOSIS — R0789 Other chest pain: Secondary | ICD-10-CM

## 2018-03-16 NOTE — Patient Instructions (Signed)
Medication Instructions:  Your physician recommends that you continue on your current medications as directed. Please refer to the Current Medication list given to you today.  If you need a refill on your cardiac medications before your next appointment, please call your pharmacy.   Lab work: None If you have labs (blood work) drawn today and your tests are completely normal, you will receive your results only by: Marland Kitchen MyChart Message (if you have MyChart) OR . A paper copy in the mail If you have any lab test that is abnormal or we need to change your treatment, we will call you to review the results.  Testing/Procedures: None  Follow-Up: At [facility], you and your health needs are our priority.  As part of our continuing mission to provide you with exceptional heart care, we have created designated Provider Care Teams.  These Care Teams include your primary Cardiologist (physician) and Advanced Practice Providers (APPs -  Physician Assistants and Nurse Practitioners) who all work together to provide you with the care you need, when you need it. You will need a follow up appointment in 12 months.  Please call our office 2 months in advance to schedule this appointment.  You may see Sinclair Grooms, MD or one of the following Advanced Practice Providers on your designated Care Team:   Truitt Merle, NP Cecilie Kicks, NP . Kathyrn Drown, NP  Any Other Special Instructions Will Be Listed Below (If Applicable).  If your GI work up is negative, contact our office and let us know so we can get you back in to see [place].

## 2018-03-16 NOTE — Progress Notes (Signed)
Cardiology Office Note:    Date:  03/16/2018   ID:  Wesley Winters, DOB 11-Nov-1940, MRN [id number]  PCP:  [name], MD  Cardiologist:  Sinclair Grooms, MD   Referring MD: [name], MD   No chief complaint on file.   History of Present Illness:    Wesley Winters is a 78 y.o. male with a hx of CAD with stent to LAD and RCA proximal and mid stents in 1998 and 1999 then cardiac cath in 2005 with patent coronary arteries. EF 45 to 50% 2017.  On last office visit the patient complained of atypical epigastric and substernal discomfort.  He describes it as a burning and sometimes a stabbing discomfort.  He can go on 5 hours.  Some days he will have it other days not.  It is not related to physical activity.  He feels it is heartburn.  Because of this story and prior history of LAD and RCA stents and 1998, cardiac evaluation/ischemia testing was performed.  The nuclear study was judged as high risk due to a calculated ejection fraction of less than 30%.  No ischemia was identified.  Repeat echo showed preserved LV function in the 45 to 50% range as it had been 3 years ago.  We discussed the results.  There is no way to be absolutely sure the coronary status is okay although in total, when reviewing the most recent data in comparison to 2017, no significant change has occurred.  Past Medical History:  Diagnosis Date  . Balanitis   . Bladder calculus   . Coronary artery disease    stents x3 by Dr Tamala Julian 1998  . Depression   . Diabetes mellitus without complication ([place])   . GERD (gastroesophageal reflux disease)   . Hypercholesterolemia   . Hypertension   . Phimosis   . Prostate cancer ([place])   . Sleep apnea    wears CPAP nightly    Past Surgical History:  Procedure Laterality Date  . CIRCUMCISION    . CYSTOSCOPY    . HERNIA REPAIR     umbilical  . PROSTATE BIOPSY    . PROSTATE BIOPSY    . PROSTATE BIOPSY    . three cardiac stents      Current Medications: Current Meds   Medication Sig  . amLODipine (NORVASC) 10 MG tablet Take 10 mg by mouth daily.   Marland Kitchen aspirin 81 MG tablet Take 81 mg by mouth daily.  Marland Kitchen atorvastatin (LIPITOR) 40 MG tablet Take 40 mg by mouth daily.  Marland Kitchen azelastine (ASTELIN) 0.1 % nasal spray Place into both nostrils 2 (two) times daily. Use in each nostril as directed  . benazepril (LOTENSIN) 20 MG tablet Take 20 mg by mouth daily.  . cholecalciferol (VITAMIN D3) 25 MCG (1000 UT) tablet Take 1,000 Units by mouth daily.  . Cyanocobalamin (VITAMIN B-12) 1000 MCG/15ML LIQD Take 1 mL by mouth daily.  . fexofenadine (ALLEGRA) 180 MG tablet Take 180 mg by mouth daily.  . fluticasone (FLONASE) 50 MCG/ACT nasal spray Place into both nostrils daily.  . hydrochlorothiazide (HYDRODIURIL) 25 MG tablet Take 25 mg by mouth daily.  . Insulin Glargine (TOUJEO MAX SOLOSTAR) 300 UNIT/ML SOPN Inject 25 Units into the skin daily.  Marland Kitchen linagliptin (TRADJENTA) 5 MG TABS tablet Take 5 mg by mouth daily.  . metFORMIN (GLUCOPHAGE-XR) 500 MG 24 hr tablet Take 2 tablets by mouth twice daily  . nebivolol (BYSTOLIC) 10 MG tablet Take 5 mg by mouth  daily.   . pantoprazole (PROTONIX) 40 MG tablet Take 40 mg by mouth daily.  . polyethylene glycol powder (MIRALAX) powder Take 1 Container by mouth once. One capful at night  . potassium chloride (K-DUR,KLOR-CON) 10 MEQ tablet Take 10 mEq by mouth daily.   . Probiotic Product (PROBIOTIC ADVANCED PO) Take 4 mg by mouth daily.  Marland Kitchen triamcinolone cream (KENALOG) 0.1 % Apply 1 application topically 2 (two) times daily.     Allergies:   Amoxicillin   Social History   Socioeconomic History  . Marital status: Married    Spouse name: Not on file  . Number of children: Not on file  . Years of education: Not on file  . Highest education level: Not on file  Occupational History  . Not on file  Social Needs  . Financial resource strain: Not on file  . Food insecurity:    Worry: Not on file    Inability: Not on file  .  Transportation needs:    Medical: Not on file    Non-medical: Not on file  Tobacco Use  . Smoking status: Never Smoker  . Smokeless tobacco: Never Used  Substance and Sexual Activity  . Alcohol use: No  . Drug use: No  . Sexual activity: Yes  Lifestyle  . Physical activity:    Days per week: Not on file    Minutes per session: Not on file  . Stress: Not on file  Relationships  . Social connections:    Talks on phone: Not on file    Gets together: Not on file    Attends religious service: Not on file    Active member of club or organization: Not on file    Attends meetings of clubs or organizations: Not on file    Relationship status: Not on file  Other Topics Concern  . Not on file  Social History Narrative  . Not on file     Family History: The patient's family history includes Arrhythmia in his brother and mother; Cancer in his brother; Heart disease in his brother and mother.  ROS:   Please see the history of present illness.    As diarrhea, anxiety, easy bruising, chest pain, and leg swelling.  All other systems reviewed and are negative.  EKGs/Labs/Other Studies Reviewed:    The following studies were reviewed today: Doppler echocardiogram 03/15/2018: IMPRESSIONS    1. The left ventricle has mildly reduced systolic function, with an ejection fraction of 45-50%. The cavity size was moderately dilated. There is mild concentric left ventricular hypertrophy. Left ventricular diastolic Doppler parameters are consistent  with impaired relaxation. Indeterminate filling pressures.  2. Mild hypokinesis of the inferolateral myocardium.  3. The right ventricle has normal systolic function. The cavity was normal. There is no increase in right ventricular wall thickness.  Myocardial perfusion imaging 03/13/2018: Study Highlights    Nuclear stress EF: 25%.  There was no ST segment deviation noted during stress.  Defect 1: There is a small defect of moderate severity  present in the apical inferior location.  Findings consistent with prior myocardial infarction.  The left ventricular ejection fraction is severely decreased (<30%).  This is a high risk study due to reduced systolic function.  No ischemia.  Global hypokinesis worse in the inferior and inferolateral myocardium.     EKG:  EKG not repeated  Recent Labs: No results found for requested labs within last 8760 hours.  Recent Lipid Panel No results found for: CHOL, TRIG, HDL,  CHOLHDL, VLDL, LDLCALC, LDLDIRECT  Physical Exam:    VS:  BP (!) 142/68   Pulse 68   Ht 5\' 8"  (1.727 m)   Wt 224 lb 9.6 oz (101.9 kg)   SpO2 96%   BMI 34.15 kg/m     Wt Readings from Last 3 Encounters:  03/16/18 224 lb 9.6 oz (101.9 kg)  03/13/18 227 lb (103 kg)  03/07/18 227 lb 1.9 oz (103 kg)     GEN: Obese abdomen.. No acute distress HEENT: Normal NECK: No JVD. LYMPHATICS: No lymphadenopathy CARDIAC: RRR.  No murmur, no gallop, no edema VASCULAR: 2+ bilateral radial pulses, no bruits RESPIRATORY:  Clear to auscultation without rales, wheezing or rhonchi  ABDOMEN: Soft, non-tender, non-distended, No pulsatile mass, MUSCULOSKELETAL: No deformity  SKIN: Warm and dry NEUROLOGIC:  Alert and oriented x 3 PSYCHIATRIC:  Normal affect   ASSESSMENT:    1. Chest discomfort   2. Hyperlipidemia LDL goal <70   3. Type 2 diabetes mellitus with other circulatory complication, without long-term current use of insulin ([place])   4. Essential hypertension   5. CAD in native artery    PLAN:    In order of problems listed above:  1. Have not totally exclude the possibility of coronary disease although most recent nuclear study is not significantly different than 3 years ago.  No ischemia was seen.  LVEF is estimated to be 25% but an echo demonstrated echo EF is still in the 45 to 50% range.  After discussion, we have decided not to proceed with coronary angiography at this time.  Clinical observation.  Have GI  work-up.  If GI work-up is negative, we should have coronary angiography to define anatomy and help rule out the possibility of atypical angina.   Medication Adjustments/Labs and Tests Ordered: Current medicines are reviewed at length with the patient today.  Concerns regarding medicines are outlined above.  No orders of the defined types were placed in this encounter.  No orders of the defined types were placed in this encounter.   Patient Instructions  Medication Instructions:  Your physician recommends that you continue on your current medications as directed. Please refer to the Current Medication list given to you today.  If you need a refill on your cardiac medications before your next appointment, please call your pharmacy.   Lab work: None If you have labs (blood work) drawn today and your tests are completely normal, you will receive your results only by: Marland Kitchen MyChart Message (if you have MyChart) OR . A paper copy in the mail If you have any lab test that is abnormal or we need to change your treatment, we will call you to review the results.  Testing/Procedures: None  Follow-Up: At [facility], you and your health needs are our priority.  As part of our continuing mission to provide you with exceptional heart care, we have created designated Provider Care Teams.  These Care Teams include your primary Cardiologist (physician) and Advanced Practice Providers (APPs -  Physician Assistants and Nurse Practitioners) who all work together to provide you with the care you need, when you need it. You will need a follow up appointment in 12 months.  Please call our office 2 months in advance to schedule this appointment.  You may see Sinclair Grooms, MD or one of the following Advanced Practice Providers on your designated Care Team:   Truitt Merle, NP Cecilie Kicks, NP . Kathyrn Drown, NP  Any Other Special Instructions  Will Be Listed Below (If Applicable).  If your GI work up  is negative, contact our office and let us know so we can get you back in to see [place].      Signed, Sinclair Grooms, MD  03/16/2018 3:53 PM    [place]

## 2018-05-03 DIAGNOSIS — E119 Type 2 diabetes mellitus without complications: Secondary | ICD-10-CM | POA: Diagnosis not present

## 2018-06-13 DIAGNOSIS — C61 Malignant neoplasm of prostate: Secondary | ICD-10-CM | POA: Diagnosis not present

## 2018-06-15 DIAGNOSIS — G4733 Obstructive sleep apnea (adult) (pediatric): Secondary | ICD-10-CM | POA: Diagnosis not present

## 2018-06-20 DIAGNOSIS — Z8546 Personal history of malignant neoplasm of prostate: Secondary | ICD-10-CM | POA: Diagnosis not present

## 2018-06-20 DIAGNOSIS — R232 Flushing: Secondary | ICD-10-CM | POA: Diagnosis not present

## 2018-06-20 DIAGNOSIS — E349 Endocrine disorder, unspecified: Secondary | ICD-10-CM | POA: Diagnosis not present

## 2018-06-20 DIAGNOSIS — R350 Frequency of micturition: Secondary | ICD-10-CM | POA: Diagnosis not present

## 2018-06-30 DIAGNOSIS — Z0000 Encounter for general adult medical examination without abnormal findings: Secondary | ICD-10-CM | POA: Diagnosis not present

## 2018-06-30 DIAGNOSIS — Z1389 Encounter for screening for other disorder: Secondary | ICD-10-CM | POA: Diagnosis not present

## 2018-07-25 DIAGNOSIS — I2510 Atherosclerotic heart disease of native coronary artery without angina pectoris: Secondary | ICD-10-CM | POA: Diagnosis not present

## 2018-07-25 DIAGNOSIS — I10 Essential (primary) hypertension: Secondary | ICD-10-CM | POA: Diagnosis not present

## 2018-07-25 DIAGNOSIS — K219 Gastro-esophageal reflux disease without esophagitis: Secondary | ICD-10-CM | POA: Diagnosis not present

## 2018-07-25 DIAGNOSIS — E1142 Type 2 diabetes mellitus with diabetic polyneuropathy: Secondary | ICD-10-CM | POA: Diagnosis not present

## 2018-08-03 DIAGNOSIS — E119 Type 2 diabetes mellitus without complications: Secondary | ICD-10-CM | POA: Diagnosis not present

## 2018-08-15 DIAGNOSIS — H524 Presbyopia: Secondary | ICD-10-CM | POA: Diagnosis not present

## 2018-08-28 DIAGNOSIS — I2510 Atherosclerotic heart disease of native coronary artery without angina pectoris: Secondary | ICD-10-CM | POA: Diagnosis not present

## 2018-08-28 DIAGNOSIS — N400 Enlarged prostate without lower urinary tract symptoms: Secondary | ICD-10-CM | POA: Diagnosis not present

## 2018-08-28 DIAGNOSIS — I10 Essential (primary) hypertension: Secondary | ICD-10-CM | POA: Diagnosis not present

## 2018-08-28 DIAGNOSIS — E1142 Type 2 diabetes mellitus with diabetic polyneuropathy: Secondary | ICD-10-CM | POA: Diagnosis not present

## 2018-10-06 DIAGNOSIS — G4733 Obstructive sleep apnea (adult) (pediatric): Secondary | ICD-10-CM | POA: Diagnosis not present

## 2018-10-09 ENCOUNTER — Encounter: Payer: Self-pay | Admitting: Podiatry

## 2018-10-09 ENCOUNTER — Ambulatory Visit: Payer: Medicare Other | Admitting: Podiatry

## 2018-10-09 ENCOUNTER — Other Ambulatory Visit: Payer: Self-pay

## 2018-10-09 DIAGNOSIS — B351 Tinea unguium: Secondary | ICD-10-CM | POA: Diagnosis not present

## 2018-10-09 DIAGNOSIS — L601 Onycholysis: Secondary | ICD-10-CM | POA: Diagnosis not present

## 2018-10-09 DIAGNOSIS — E1142 Type 2 diabetes mellitus with diabetic polyneuropathy: Secondary | ICD-10-CM | POA: Diagnosis not present

## 2018-10-09 DIAGNOSIS — S91211A Laceration without foreign body of right great toe with damage to nail, initial encounter: Secondary | ICD-10-CM | POA: Diagnosis not present

## 2018-10-09 MED ORDER — MUPIROCIN 2 % EX OINT: TOPICAL_OINTMENT | CUTANEOUS | 0 refills | Status: DC

## 2018-10-09 MED ORDER — DOXYCYCLINE HYCLATE 100 MG PO CAPS: 100.0000 mg | ORAL_CAPSULE | Freq: Two times a day (BID) | ORAL | 0 refills | Status: AC

## 2018-10-09 NOTE — Patient Instructions (Addendum)
DRESSING CHANGES RIGHT FOOT: WEAR SURGICAL SHOE AT ALL TIMES    1. KEEP RIGHT  FOOT DRY AT ALL TIMES!!!!  2. CLEANSE ULCER WITH SALINE.  3. DAB DRY WITH GAUZE SPONGE.  4. APPLY A LIGHT AMOUNT OF MUPIROCIN OINTMENT TO BASE OF ULCER.  5. APPLY OUTER DRESSING/BAND-AID AS INSTRUCTED.  6. WEAR SURGICAL SHOE DAILY AT ALL TIMES.  7. DO NOT WALK BAREFOOT!!!  8.  IF YOU EXPERIENCE ANY FEVER, CHILLS, NIGHTSWEATS, NAUSEA OR VOMITING, ELEVATED OR LOW BLOOD SUGARS, REPORT TO EMERGENCY ROOM.  9. IF YOU EXPERIENCE INCREASED REDNESS, PAIN, SWELLING, DISCOLORATION, ODOR, PUS, DRAINAGE OR WARMTH OF YOUR FOOT, REPORT TO EMERGENCY ROOM.  Diabetes Mellitus and Foot Care Foot care is an important part of your health, especially when you have diabetes. Diabetes may cause you to have problems because of poor blood flow (circulation) to your feet and legs, which can cause your skin to:  Become thinner and drier.  Break more easily.  Heal more slowly.  Peel and crack. You may also have nerve damage (neuropathy) in your legs and feet, causing decreased feeling in them. This means that you may not notice minor injuries to your feet that could lead to more serious problems. Noticing and addressing any potential problems early is the best way to prevent future foot problems. How to care for your feet Foot hygiene  Wash your feet daily with warm water and mild soap. Do not use hot water. Then, pat your feet and the areas between your toes until they are completely dry. Do not soak your feet as this can dry your skin.  Trim your toenails straight across. Do not dig under them or around the cuticle. File the edges of your nails with an emery board or nail file.  Apply a moisturizing lotion or petroleum jelly to the skin on your feet and to dry, brittle toenails. Use lotion that does not contain alcohol and is unscented. Do not apply lotion between your toes. Shoes and socks  Wear clean socks or stockings  every day. Make sure they are not too tight. Do not wear knee-high stockings since they may decrease blood flow to your legs.  Wear shoes that fit properly and have enough cushioning. Always look in your shoes before you put them on to be sure there are no objects inside.  To break in new shoes, wear them for just a few hours a day. This prevents injuries on your feet. Wounds, scrapes, corns, and calluses  Check your feet daily for blisters, cuts, bruises, sores, and redness. If you cannot see the bottom of your feet, use a mirror or ask someone for help.  Do not cut corns or calluses or try to remove them with medicine.  If you find a minor scrape, cut, or break in the skin on your feet, keep it and the skin around it clean and dry. You may clean these areas with mild soap and water. Do not clean the area with peroxide, alcohol, or iodine.  If you have a wound, scrape, corn, or callus on your foot, look at it several times a day to make sure it is healing and not infected. Check for: ? Redness, swelling, or pain. ? Fluid or blood. ? Warmth. ? Pus or a bad smell. General instructions  Do not cross your legs. This may decrease blood flow to your feet.  Do not use heating pads or hot water bottles on your feet. They may burn your skin. If  you have lost feeling in your feet or legs, you may not know this is happening until it is too late.  Protect your feet from hot and cold by wearing shoes, such as at the beach or on hot pavement.  Schedule a complete foot exam at least once a year (annually) or more often if you have foot problems. If you have foot problems, report any cuts, sores, or bruises to your health care provider immediately. Contact a health care provider if:  You have a medical condition that increases your risk of infection and you have any cuts, sores, or bruises on your feet.  You have an injury that is not healing.  You have redness on your legs or feet.  You feel  burning or tingling in your legs or feet.  You have pain or cramps in your legs and feet.  Your legs or feet are numb.  Your feet always feel cold.  You have pain around a toenail. Get help right away if:  You have a wound, scrape, corn, or callus on your foot and: ? You have pain, swelling, or redness that gets worse. ? You have fluid or blood coming from the wound, scrape, corn, or callus. ? Your wound, scrape, corn, or callus feels warm to the touch. ? You have pus or a bad smell coming from the wound, scrape, corn, or callus. ? You have a fever. ? You have a red line going up your leg. Summary  Check your feet every day for cuts, sores, red spots, swelling, and blisters.  Moisturize feet and legs daily.  Wear shoes that fit properly and have enough cushioning.  If you have foot problems, report any cuts, sores, or bruises to your health care provider immediately.  Schedule a complete foot exam at least once a year (annually) or more often if you have foot problems. This information is not intended to replace advice given to you by your health care provider. Make sure you discuss any questions you have with your health care provider. Document Released: 12/19/1999 Document Revised: 02/02/2017 Document Reviewed: 01/23/2016 Elsevier Patient Education  2020 [employer].

## 2018-10-10 NOTE — Progress Notes (Signed)
Subjective: Wesley Winters is seen today for cc of laceration sustained while attempting to cut his toenails several days ago. Patient relates bleeding after cutting the tip of his right hallux. He states bleeding eventually stopped. He has since been keeping it clean with daily application of Polysporin. He is concerned about healing potential since he is diabetic.  He has been to our clinic before. Last visit was April, 2018.   He also relates blister dorsal aspect of 4th digit. Unsure of what precipitated this. He has been applying Polysporin to this area daily as well.   He denies any fever, chills, night sweats, nausea or vomiting.  Current Outpatient Medications on File Prior to Visit  Medication Sig  . amLODipine (NORVASC) 10 MG tablet Take 10 mg by mouth daily.   Marland Kitchen aspirin 81 MG tablet Take 81 mg by mouth daily.  Marland Kitchen atorvastatin (LIPITOR) 40 MG tablet Take 40 mg by mouth daily.  Marland Kitchen azelastine (ASTELIN) 0.1 % nasal spray Place into both nostrils 2 (two) times daily. Use in each nostril as directed  . benazepril (LOTENSIN) 20 MG tablet Take 20 mg by mouth daily.  . cholecalciferol (VITAMIN D3) 25 MCG (1000 UT) tablet Take 1,000 Units by mouth daily.  . Cyanocobalamin (VITAMIN B-12) 1000 MCG/15ML LIQD Take 1 mL by mouth daily.  . fexofenadine (ALLEGRA) 180 MG tablet Take 180 mg by mouth daily.  . fluticasone (FLONASE) 50 MCG/ACT nasal spray Place into both nostrils daily.  . hydrochlorothiazide (HYDRODIURIL) 25 MG tablet Take 25 mg by mouth daily.  . Insulin Glargine (TOUJEO MAX SOLOSTAR) 300 UNIT/ML SOPN Inject 25 Units into the skin daily.  Marland Kitchen JANUVIA 100 MG tablet   . linagliptin (TRADJENTA) 5 MG TABS tablet Take 5 mg by mouth daily.  . metFORMIN (GLUCOPHAGE-XR) 500 MG 24 hr tablet Take 2 tablets by mouth twice daily  . nebivolol (BYSTOLIC) 10 MG tablet Take 5 mg by mouth daily.   . pantoprazole (PROTONIX) 40 MG tablet Take 40 mg by mouth daily.  . polyethylene glycol powder (MIRALAX)  powder Take 1 Container by mouth once. One capful at night  . potassium chloride (K-DUR,KLOR-CON) 10 MEQ tablet Take 10 mEq by mouth daily.   . Probiotic Product (PROBIOTIC ADVANCED PO) Take 4 mg by mouth daily.  . TOVIAZ 4 MG TB24 tablet Take 4 mg by mouth daily.  Marland Kitchen triamcinolone cream (KENALOG) 0.1 % Apply 1 application topically 2 (two) times daily.   No current facility-administered medications on file prior to visit.      Allergies  Allergen Reactions  . Amoxicillin Hives and Nausea Only   Objective:  Vascular Examination: Capillary refill time immediate x 10 digits.  Dorsalis pedis 1/4 right foot; 2/4 left foot.  Posterior tibial pulses 1/4 b/l.  Digital hair absent b/l.  Skin temperature gradient WNL b/l.   +1 edema noted b/l feet/ankles. No increased warmth. No pain with calf compression.  Dermatological Examination: Skin atrophy noted b/l.  Toenails 1-5 left, 2-5 right discolored, thick, dystrophic with subungual debris and pain with palpation to nailbeds due to thickness of nails.  Right hallux with evidence of self-inflicted laceration distal tip of hallux as well as nail bed. There is loose nailplate noted laterally and is lifting. Distal edge of remaining nail plate bleeds easily with little provocation indicating some granulation tissue at distal edge.  There is noted onchyolysis of entire nailplate of the right 3rd digit.  The nailbed remains intact. There is mild erythema proximal nail  border with communicating blister with serous drainage. No purulence. No odor. No deep infection noted.  Musculoskeletal: Muscle strength 5/5 to all LE muscle groups.  Hammertoes right 3rd digit, left 2nd digit.  No pain, crepitus or joint limitation noted with ROM.   Neurological Examination: Protective sensation absent with 10 gram monofilament bilaterally.  Vibratory sensation intact bilaterally.   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 Discussed importance of avoiding self trimming of toenails. Patient related understanding. 2. Debrided loose portion of right hallux nailplate. Lumicain hemostatic solution applied to area of light bleeding. Triple antibiotic ointment and band aid applied. Gently removed loose nailplate en toto right 3rd digit. Nailbed cleansed with alcohol. Triple antibiotic ointment and band aid applied.  3. Patient given written instructions on dressing changes for right hallux and right 3rd digit. 4. Rx for Doxycycline 100 mg po bid x 7 days. 5. Rx for Mupirocin Ointment to be applied to right hallux and right 3rd digit once daily. 6. Offered [employer] Surgical Shoe for right foot. Patient refused.  7. Remaining toenails 1-5 left, 2, 4, 5 were debrided in length and girth without iatrogenic bleeding. 8. Patient to continue soft, supportive shoe gear daily. 9. Patient to report any pedal injuries to medical professional immediately. 10. Follow up with me in one week. 11. Patient instructed to report to emergency department with worsening appearance of ulcer/toe/foot, increased pain, foul odor, increased redness, swelling, drainage, fever, chills, nightsweats, nausea, vomiting, increased blood sugar. Patient/POA related understanding. 12. Patient/POA to call should there be a concern in the interim.

## 2018-10-16 ENCOUNTER — Other Ambulatory Visit: Payer: Self-pay

## 2018-10-16 ENCOUNTER — Encounter: Payer: Self-pay | Admitting: Podiatry

## 2018-10-16 ENCOUNTER — Ambulatory Visit: Payer: Medicare Other | Admitting: Podiatry

## 2018-10-16 DIAGNOSIS — M2041 Other hammer toe(s) (acquired), right foot: Secondary | ICD-10-CM

## 2018-10-16 DIAGNOSIS — L601 Onycholysis: Secondary | ICD-10-CM | POA: Diagnosis not present

## 2018-10-16 DIAGNOSIS — S91211A Laceration without foreign body of right great toe with damage to nail, initial encounter: Secondary | ICD-10-CM | POA: Diagnosis not present

## 2018-10-16 DIAGNOSIS — M2042 Other hammer toe(s) (acquired), left foot: Secondary | ICD-10-CM

## 2018-10-16 DIAGNOSIS — E1142 Type 2 diabetes mellitus with diabetic polyneuropathy: Secondary | ICD-10-CM | POA: Diagnosis not present

## 2018-10-16 DIAGNOSIS — E119 Type 2 diabetes mellitus without complications: Secondary | ICD-10-CM | POA: Diagnosis not present

## 2018-10-16 NOTE — Patient Instructions (Signed)
Diabetes Mellitus and Foot Care Foot care is an important part of your health, especially when you have diabetes. Diabetes may cause you to have problems because of poor blood flow (circulation) to your feet and legs, which can cause your skin to:  Become thinner and drier.  Break more easily.  Heal more slowly.  Peel and crack. You may also have nerve damage (neuropathy) in your legs and feet, causing decreased feeling in them. This means that you may not notice minor injuries to your feet that could lead to more serious problems. Noticing and addressing any potential problems early is the best way to prevent future foot problems. How to care for your feet Foot hygiene  Wash your feet daily with warm water and mild soap. Do not use hot water. Then, pat your feet and the areas between your toes until they are completely dry. Do not soak your feet as this can dry your skin.  Trim your toenails straight across. Do not dig under them or around the cuticle. File the edges of your nails with an emery board or nail file.  Apply a moisturizing lotion or petroleum jelly to the skin on your feet and to dry, brittle toenails. Use lotion that does not contain alcohol and is unscented. Do not apply lotion between your toes. Shoes and socks  Wear clean socks or stockings every day. Make sure they are not too tight. Do not wear knee-high stockings since they may decrease blood flow to your legs.  Wear shoes that fit properly and have enough cushioning. Always look in your shoes before you put them on to be sure there are no objects inside.  To break in new shoes, wear them for just a few hours a day. This prevents injuries on your feet. Wounds, scrapes, corns, and calluses  Check your feet daily for blisters, cuts, bruises, sores, and redness. If you cannot see the bottom of your feet, use a mirror or ask someone for help.  Do not cut corns or calluses or try to remove them with medicine.  If you  find a minor scrape, cut, or break in the skin on your feet, keep it and the skin around it clean and dry. You may clean these areas with mild soap and water. Do not clean the area with peroxide, alcohol, or iodine.  If you have a wound, scrape, corn, or callus on your foot, look at it several times a day to make sure it is healing and not infected. Check for: ? Redness, swelling, or pain. ? Fluid or blood. ? Warmth. ? Pus or a bad smell. General instructions  Do not cross your legs. This may decrease blood flow to your feet.  Do not use heating pads or hot water bottles on your feet. They may burn your skin. If you have lost feeling in your feet or legs, you may not know this is happening until it is too late.  Protect your feet from hot and cold by wearing shoes, such as at the beach or on hot pavement.  Schedule a complete foot exam at least once a year (annually) or more often if you have foot problems. If you have foot problems, report any cuts, sores, or bruises to your health care provider immediately. Contact a health care provider if:  You have a medical condition that increases your risk of infection and you have any cuts, sores, or bruises on your feet.  You have an injury that is not   healing.  You have redness on your legs or feet.  You feel burning or tingling in your legs or feet.  You have pain or cramps in your legs and feet.  Your legs or feet are numb.  Your feet always feel cold.  You have pain around a toenail. Get help right away if:  You have a wound, scrape, corn, or callus on your foot and: ? You have pain, swelling, or redness that gets worse. ? You have fluid or blood coming from the wound, scrape, corn, or callus. ? Your wound, scrape, corn, or callus feels warm to the touch. ? You have pus or a bad smell coming from the wound, scrape, corn, or callus. ? You have a fever. ? You have a red line going up your leg. Summary  Check your feet every day  for cuts, sores, red spots, swelling, and blisters.  Moisturize feet and legs daily.  Wear shoes that fit properly and have enough cushioning.  If you have foot problems, report any cuts, sores, or bruises to your health care provider immediately.  Schedule a complete foot exam at least once a year (annually) or more often if you have foot problems. This information is not intended to replace advice given to you by your health care provider. Make sure you discuss any questions you have with your health care provider. Document Released: 12/19/1999 Document Revised: 02/02/2017 Document Reviewed: 01/23/2016 Elsevier Patient Education  2020 [employer]  

## 2018-10-16 NOTE — Progress Notes (Signed)
Subjective:   [name] presents for continued care of of self-inflicted laceration right hallux and onycholysis right 3rd digit.  Patient has been performing daily dressing changes to both digits utilizing Mupirocin Ointment. Pt. denies any new complaints.  Patient denies any fever, chills, nightsweats, nausea or vomiting. He states both digits look and feel better.  Current Outpatient Medications on File Prior to Visit  Medication Sig Dispense Refill  . amLODipine (NORVASC) 10 MG tablet Take 10 mg by mouth daily.   2  . aspirin 81 MG tablet Take 81 mg by mouth daily.    Marland Kitchen atorvastatin (LIPITOR) 40 MG tablet Take 40 mg by mouth daily.    Marland Kitchen azelastine (ASTELIN) 0.1 % nasal spray Place into both nostrils 2 (two) times daily. Use in each nostril as directed    . benazepril (LOTENSIN) 20 MG tablet Take 20 mg by mouth daily.  3  . cholecalciferol (VITAMIN D3) 25 MCG (1000 UT) tablet Take 1,000 Units by mouth daily.    . Cyanocobalamin (VITAMIN B-12) 1000 MCG/15ML LIQD Take 1 mL by mouth daily.    Marland Kitchen doxycycline (VIBRAMYCIN) 100 MG capsule Take 1 capsule (100 mg total) by mouth 2 (two) times daily for 7 days. 14 capsule 0  . fexofenadine (ALLEGRA) 180 MG tablet Take 180 mg by mouth daily.    . fluticasone (FLONASE) 50 MCG/ACT nasal spray Place into both nostrils daily.    . hydrochlorothiazide (HYDRODIURIL) 25 MG tablet Take 25 mg by mouth daily.    . Insulin Glargine (TOUJEO MAX SOLOSTAR) 300 UNIT/ML SOPN Inject 25 Units into the skin daily.    Marland Kitchen JANUVIA 100 MG tablet     . linagliptin (TRADJENTA) 5 MG TABS tablet Take 5 mg by mouth daily.    . metFORMIN (GLUCOPHAGE-XR) 500 MG 24 hr tablet Take 2 tablets by mouth twice daily  2  . mupirocin ointment (BACTROBAN) 2 % Apply to right great toe and right 3rd toe once daily 22 g 0  . nebivolol (BYSTOLIC) 10 MG tablet Take 5 mg by mouth daily.     . pantoprazole (PROTONIX) 40 MG tablet Take 40 mg by mouth daily.    . polyethylene glycol powder  (MIRALAX) powder Take 1 Container by mouth once. One capful at night    . potassium chloride (K-DUR,KLOR-CON) 10 MEQ tablet Take 10 mEq by mouth daily.     . Probiotic Product (PROBIOTIC ADVANCED PO) Take 4 mg by mouth daily.    . TOVIAZ 4 MG TB24 tablet Take 4 mg by mouth daily.    Marland Kitchen triamcinolone cream (KENALOG) 0.1 % Apply 1 application topically 2 (two) times daily.     No current facility-administered medications on file prior to visit.      Allergies  Allergen Reactions  . Amoxicillin Hives and Nausea Only   Objective:   Vascular Examination:  Capillary refill time immediate x 10 digits.  Dorsalis pedis pulses 1/4 right; 2/4 left foot.  Posterior tibial pulses 1/4 b/l.  Digital hair absent b/l.  Skin temperature gradient WNL b/l.  Dermatological Examination: Skin thin, shiny and atrophic b/l.  Toenails 1-5 b/l recently debrided.  Right hallux laceration healing. No ischemia/gangrene. Digit remains viable. No erythema, no edema, no flocculence. No abnormal warmth/coolness.  Right 3rd digit nailbed epithelialized. No erythema, no edema, no drainage.  Musculoskeletal: Muscle strength 5/5 to all LE muscle groups bilaterally.  Hammertoes right 3rd digit, left 2nd digit.  Neurological: Sensation absent bilaterally with 10  gram monofilament.  Vibratory sensation intact b/l.  Assessment:   1.  Laceration right hallux healing 2. Onycholysis right 3rd digit healed 3. Encounter for diabetic foot exam 4. Hammertoes 5. NIDDM with peripheral neuropathy  Plan: 1. Right hallux and right 3rd digit cleansed with alcohol. Triple antibiotic ointment applied.  He is to continue applying Mupirocin Ointment once daily for the next 2 weeks, then stop. 2. Advised him to not attempt nail trimming again.  3. Continue soft, supportive shoe gear daily. 4. Patient is to follow up 9 weeks. 5. Patient instructed to report to emergency department with worsening appearance of toe/foot,  increased pain, foul odor, increased redness, swelling, drainage, fever, chills, nightsweats, nausea, vomiting, increased blood sugar.  6. Patient/POA related understanding.

## 2018-11-02 DIAGNOSIS — E1142 Type 2 diabetes mellitus with diabetic polyneuropathy: Secondary | ICD-10-CM | POA: Diagnosis not present

## 2018-11-02 DIAGNOSIS — N400 Enlarged prostate without lower urinary tract symptoms: Secondary | ICD-10-CM | POA: Diagnosis not present

## 2018-11-02 DIAGNOSIS — E119 Type 2 diabetes mellitus without complications: Secondary | ICD-10-CM | POA: Diagnosis not present

## 2018-11-02 DIAGNOSIS — I2510 Atherosclerotic heart disease of native coronary artery without angina pectoris: Secondary | ICD-10-CM | POA: Diagnosis not present

## 2018-11-02 DIAGNOSIS — I10 Essential (primary) hypertension: Secondary | ICD-10-CM | POA: Diagnosis not present

## 2018-11-20 DIAGNOSIS — C44319 Basal cell carcinoma of skin of other parts of face: Secondary | ICD-10-CM | POA: Diagnosis not present

## 2018-11-20 DIAGNOSIS — D485 Neoplasm of uncertain behavior of skin: Secondary | ICD-10-CM | POA: Diagnosis not present

## 2018-11-20 DIAGNOSIS — L578 Other skin changes due to chronic exposure to nonionizing radiation: Secondary | ICD-10-CM | POA: Diagnosis not present

## 2018-11-23 DIAGNOSIS — K219 Gastro-esophageal reflux disease without esophagitis: Secondary | ICD-10-CM | POA: Diagnosis not present

## 2018-11-23 DIAGNOSIS — E1142 Type 2 diabetes mellitus with diabetic polyneuropathy: Secondary | ICD-10-CM | POA: Diagnosis not present

## 2018-11-23 DIAGNOSIS — I10 Essential (primary) hypertension: Secondary | ICD-10-CM | POA: Diagnosis not present

## 2018-12-11 ENCOUNTER — Encounter: Payer: Self-pay | Admitting: Podiatry

## 2018-12-11 ENCOUNTER — Other Ambulatory Visit: Payer: Self-pay

## 2018-12-11 ENCOUNTER — Ambulatory Visit: Payer: Medicare Other | Admitting: Podiatry

## 2018-12-11 DIAGNOSIS — M79674 Pain in right toe(s): Secondary | ICD-10-CM | POA: Diagnosis not present

## 2018-12-11 DIAGNOSIS — M79675 Pain in left toe(s): Secondary | ICD-10-CM

## 2018-12-11 DIAGNOSIS — B351 Tinea unguium: Secondary | ICD-10-CM

## 2018-12-12 DIAGNOSIS — C61 Malignant neoplasm of prostate: Secondary | ICD-10-CM | POA: Diagnosis not present

## 2018-12-16 NOTE — Progress Notes (Signed)
Subjective: Wesley Winters is seen today for follow up painful, elongated, thickened toenails bilateral feet that he cannot cut. Pain interferes with daily activities. Aggravating factor includes wearing enclosed shoe gear and relieved with periodic debridement.  Medications reviewed in chart.  Allergies  Allergen Reactions  . Amoxicillin Hives and Nausea Only    Objective:  Vascular Examination: Capillary refill time immediate b/l.  Dorsalis pedis pulses faintly palpable right, palpable left foot.  Posterior tibial pulses faintly palpable b/l.   Digital hair absent b/l.  Skin temperature gradient WNL b/l.   Dermatological Examination: Skin is thin, shiny and atrophic b/l.   Toenails 1-5 b/l discolored, thick, dystrophic with subungual debris and pain with palpation to nailbeds due to thickness of nails.  Musculoskeletal: Muscle strength 5/5 to all LE muscle groups b/l.  Hammertoes right 3rd digit, left 2nd digit.  No pain, crepitus or joint limitation noted with ROM.   Neurological Examination: Protective sensation absent with 10 gram monofilament bilaterally.   Assessment: Painful onychomycosis toenails 1-5 b/l   Plan: 1. Toenails 1-5 b/l were debrided in length and girth without iatrogenic bleeding. 2. Patient to continue soft, supportive shoe gear daily. 3. Patient to report any pedal injuries to medical professional immediately. 4. Follow up 3 months.  5. Patient/POA to call should there be a concern in the interim.

## 2018-12-19 DIAGNOSIS — R350 Frequency of micturition: Secondary | ICD-10-CM | POA: Diagnosis not present

## 2018-12-19 DIAGNOSIS — Z8546 Personal history of malignant neoplasm of prostate: Secondary | ICD-10-CM | POA: Diagnosis not present

## 2019-01-12 DIAGNOSIS — G4733 Obstructive sleep apnea (adult) (pediatric): Secondary | ICD-10-CM | POA: Diagnosis not present

## 2019-01-22 ENCOUNTER — Ambulatory Visit: Payer: Medicare Other | Admitting: Podiatry

## 2019-01-23 DIAGNOSIS — C44319 Basal cell carcinoma of skin of other parts of face: Secondary | ICD-10-CM | POA: Diagnosis not present

## 2019-01-23 DIAGNOSIS — D485 Neoplasm of uncertain behavior of skin: Secondary | ICD-10-CM | POA: Diagnosis not present

## 2019-02-02 DIAGNOSIS — I10 Essential (primary) hypertension: Secondary | ICD-10-CM | POA: Diagnosis not present

## 2019-02-02 DIAGNOSIS — E1142 Type 2 diabetes mellitus with diabetic polyneuropathy: Secondary | ICD-10-CM | POA: Diagnosis not present

## 2019-02-02 DIAGNOSIS — I2510 Atherosclerotic heart disease of native coronary artery without angina pectoris: Secondary | ICD-10-CM | POA: Diagnosis not present

## 2019-02-02 DIAGNOSIS — N400 Enlarged prostate without lower urinary tract symptoms: Secondary | ICD-10-CM | POA: Diagnosis not present

## 2019-02-06 DIAGNOSIS — C44319 Basal cell carcinoma of skin of other parts of face: Secondary | ICD-10-CM | POA: Diagnosis not present

## 2019-02-12 ENCOUNTER — Other Ambulatory Visit: Payer: Self-pay

## 2019-02-12 ENCOUNTER — Encounter: Payer: Self-pay | Admitting: Podiatry

## 2019-02-12 ENCOUNTER — Ambulatory Visit: Payer: Medicare Other | Admitting: Podiatry

## 2019-02-12 DIAGNOSIS — M79674 Pain in right toe(s): Secondary | ICD-10-CM

## 2019-02-12 DIAGNOSIS — T1490XD Injury, unspecified, subsequent encounter: Secondary | ICD-10-CM | POA: Diagnosis not present

## 2019-02-12 DIAGNOSIS — B351 Tinea unguium: Secondary | ICD-10-CM

## 2019-02-12 DIAGNOSIS — E1142 Type 2 diabetes mellitus with diabetic polyneuropathy: Secondary | ICD-10-CM

## 2019-02-12 DIAGNOSIS — M79675 Pain in left toe(s): Secondary | ICD-10-CM | POA: Diagnosis not present

## 2019-02-12 DIAGNOSIS — L600 Ingrowing nail: Secondary | ICD-10-CM

## 2019-02-12 NOTE — Patient Instructions (Signed)
Diabetes Mellitus and Foot Care Foot care is an important part of your health, especially when you have diabetes. Diabetes may cause you to have problems because of poor blood flow (circulation) to your feet and legs, which can cause your skin to:  Become thinner and drier.  Break more easily.  Heal more slowly.  Peel and crack. You may also have nerve damage (neuropathy) in your legs and feet, causing decreased feeling in them. This means that you may not notice minor injuries to your feet that could lead to more serious problems. Noticing and addressing any potential problems early is the best way to prevent future foot problems. How to care for your feet Foot hygiene  Wash your feet daily with warm water and mild soap. Do not use hot water. Then, pat your feet and the areas between your toes until they are completely dry. Do not soak your feet as this can dry your skin.  Trim your toenails straight across. Do not dig under them or around the cuticle. File the edges of your nails with an emery board or nail file.  Apply a moisturizing lotion or petroleum jelly to the skin on your feet and to dry, brittle toenails. Use lotion that does not contain alcohol and is unscented. Do not apply lotion between your toes. Shoes and socks  Wear clean socks or stockings every day. Make sure they are not too tight. Do not wear knee-high stockings since they may decrease blood flow to your legs.  Wear shoes that fit properly and have enough cushioning. Always look in your shoes before you put them on to be sure there are no objects inside.  To break in new shoes, wear them for just a few hours a day. This prevents injuries on your feet. Wounds, scrapes, corns, and calluses  Check your feet daily for blisters, cuts, bruises, sores, and redness. If you cannot see the bottom of your feet, use a mirror or ask someone for help.  Do not cut corns or calluses or try to remove them with medicine.  If you  find a minor scrape, cut, or break in the skin on your feet, keep it and the skin around it clean and dry. You may clean these areas with mild soap and water. Do not clean the area with peroxide, alcohol, or iodine.  If you have a wound, scrape, corn, or callus on your foot, look at it several times a day to make sure it is healing and not infected. Check for: ? Redness, swelling, or pain. ? Fluid or blood. ? Warmth. ? Pus or a bad smell. General instructions  Do not cross your legs. This may decrease blood flow to your feet.  Do not use heating pads or hot water bottles on your feet. They may burn your skin. If you have lost feeling in your feet or legs, you may not know this is happening until it is too late.  Protect your feet from hot and cold by wearing shoes, such as at the beach or on hot pavement.  Schedule a complete foot exam at least once a year (annually) or more often if you have foot problems. If you have foot problems, report any cuts, sores, or bruises to your health care provider immediately. Contact a health care provider if:  You have a medical condition that increases your risk of infection and you have any cuts, sores, or bruises on your feet.  You have an injury that is not   healing.  You have redness on your legs or feet.  You feel burning or tingling in your legs or feet.  You have pain or cramps in your legs and feet.  Your legs or feet are numb.  Your feet always feel cold.  You have pain around a toenail. Get help right away if:  You have a wound, scrape, corn, or callus on your foot and: ? You have pain, swelling, or redness that gets worse. ? You have fluid or blood coming from the wound, scrape, corn, or callus. ? Your wound, scrape, corn, or callus feels warm to the touch. ? You have pus or a bad smell coming from the wound, scrape, corn, or callus. ? You have a fever. ? You have a red line going up your leg. Summary  Check your feet every day  for cuts, sores, red spots, swelling, and blisters.  Moisturize feet and legs daily.  Wear shoes that fit properly and have enough cushioning.  If you have foot problems, report any cuts, sores, or bruises to your health care provider immediately.  Schedule a complete foot exam at least once a year (annually) or more often if you have foot problems. This information is not intended to replace advice given to you by your health care provider. Make sure you discuss any questions you have with your health care provider. Document Revised: 09/13/2018 Document Reviewed: 01/23/2016 Elsevier Patient Education  2020 [employer]  

## 2019-02-15 NOTE — Progress Notes (Signed)
Subjective: Wesley Winters presents today for follow up of preventative diabetic foot care and painful mycotic nails b/l that are difficult to trim. Pain interferes with ambulation. Aggravating factors include wearing enclosed shoe gear. Pain is relieved with periodic professional debridement.   He states right great toenail has grown in nicely.  Patient states he may have hit his right foot on the bed post or commode. Unsure as he has neuropathy.   Allergies  Allergen Reactions  . Amoxicillin Hives and Nausea Only     Objective: There were no vitals filed for this visit.  Vascular Examination:  Capillary refill time to digits immediate b/l, DP pulses faintly palpable right foot; palpable left foot, faintly palpable PT pulses b/l, pedal hair absent b/l and skin temperature gradient within normal limits b/l  Dermatological Examination: Pedal skin is thin shiny, atrophic bilaterally, no interdigital macerations bilaterally and toenails 1-5 b/l elongated, dystrophic, thickened, crumbly with subungual debris.  He has a healing wound with scab which measures 0.8 x 0.5 cm, stable/adhered  on lateral aspect of right 5th metatarsal head. No erythema, no edema, no drainage, no flocculence.  Incurvated nailplate right great toe b/l border(s) with tenderness to palpation. No erythema, no edema, no drainage noted.  Musculoskeletal: Normal muscle strength 5/5 to all lower extremity muscle groups bilaterally, no pain crepitus or joint limitation noted with ROM b/l and hammertoes noted to the  right 3rd and left 2nd digits.  Neurological: Protective sensation absent with 10g monofilament b/l and vibratory sensation absent b/l  Assessment: Pain due to onychomycosis of toenails of both feet  Ingrown toenail without infection  Healing wound right 5th metatarsal  Diabetic peripheral neuropathy associated with type 2 diabetes mellitus ([place])  Plan: -Continue diabetic foot care principles.  Literature dispensed on today.  -Toenails 1-5 b/l were debrided in length and girth without iatrogenic bleeding. Offending nail border debrided and curretaged right hallux. Border cleansed with alcohol and triple antibiotic applied. He was instructed to apply antibiotic ointment to right 5th digit with band-aid once daily for one week. -For healing wound right foot, monitor. Call if he has any problems or notices redness, drainage, swelling. Change band-aid daily for protection for one week. -Patient to continue soft, supportive shoe gear daily. -Patient to report any pedal injuries to medical professional immediately. -Patient/POA to call should there be question/concern in the interim.  Return in about 9 weeks (around 04/16/2019) for diabetic nail trim.

## 2019-02-19 ENCOUNTER — Other Ambulatory Visit: Payer: Self-pay

## 2019-02-19 ENCOUNTER — Emergency Department (HOSPITAL_COMMUNITY)
Admission: EM | Admit: 2019-02-19 | Discharge: 2019-02-19 | Disposition: A | Discharge status: Home / Self Care | Payer: Medicare Other | Attending: Emergency Medicine | Admitting: Emergency Medicine

## 2019-02-19 DIAGNOSIS — Z7984 Long term (current) use of oral hypoglycemic drugs: Secondary | ICD-10-CM | POA: Insufficient documentation

## 2019-02-19 DIAGNOSIS — I10 Essential (primary) hypertension: Secondary | ICD-10-CM | POA: Insufficient documentation

## 2019-02-19 DIAGNOSIS — R0902 Hypoxemia: Secondary | ICD-10-CM | POA: Diagnosis not present

## 2019-02-19 DIAGNOSIS — E119 Type 2 diabetes mellitus without complications: Secondary | ICD-10-CM | POA: Diagnosis not present

## 2019-02-19 DIAGNOSIS — Z8546 Personal history of malignant neoplasm of prostate: Secondary | ICD-10-CM | POA: Diagnosis not present

## 2019-02-19 DIAGNOSIS — Z79899 Other long term (current) drug therapy: Secondary | ICD-10-CM | POA: Insufficient documentation

## 2019-02-19 DIAGNOSIS — Z7982 Long term (current) use of aspirin: Secondary | ICD-10-CM | POA: Diagnosis not present

## 2019-02-19 DIAGNOSIS — R58 Hemorrhage, not elsewhere classified: Secondary | ICD-10-CM | POA: Diagnosis not present

## 2019-02-19 DIAGNOSIS — T148XXA Other injury of unspecified body region, initial encounter: Secondary | ICD-10-CM

## 2019-02-19 DIAGNOSIS — L7622 Postprocedural hemorrhage and hematoma of skin and subcutaneous tissue following other procedure: Secondary | ICD-10-CM | POA: Insufficient documentation

## 2019-02-19 DIAGNOSIS — I2510 Atherosclerotic heart disease of native coronary artery without angina pectoris: Secondary | ICD-10-CM | POA: Diagnosis not present

## 2019-02-19 DIAGNOSIS — T8189XA Other complications of procedures, not elsewhere classified, initial encounter: Secondary | ICD-10-CM | POA: Diagnosis not present

## 2019-02-19 MED ORDER — BACITRACIN ZINC 500 UNIT/GM EX OINT
TOPICAL_OINTMENT | Freq: Once | CUTANEOUS | Status: AC
  Administered 2019-02-19: 1 via TOPICAL
  Filled 2019-02-19: qty 0.9

## 2019-02-19 MED ORDER — LIDOCAINE HCL (PF) 2 % IJ SOLN
10.0000 mL | Freq: Once | INTRAMUSCULAR | Status: AC
  Administered 2019-02-19: 11:00:00 10 mL via INTRADERMAL

## 2019-02-19 MED ORDER — LIDOCAINE-EPINEPHRINE (PF) 2 %-1:200000 IJ SOLN: 10.0000 mL | Freq: Once | INTRAMUSCULAR | Status: DC

## 2019-02-19 NOTE — Discharge Instructions (Addendum)
It was our pleasure to provide your ER care today - we hope that you feel better.  If bleeding recurs, hold direct, firm pressure with finger tip directly on the bleeding point, and hold pressure without letting up for 10 minutes. If bleeding persists/recurs, return to ED.  Follow up with your doctor as planned.  Return to ER if worse, new symptoms, recurrent or persistent bleeding, weak/faint, fevers, or other concern.

## 2019-02-19 NOTE — ED Notes (Signed)
Pt ambulatory to bathroom, standby assistance.  

## 2019-02-19 NOTE — ED Triage Notes (Signed)
Pt reports 2 sites where skin cancer were removed from left forehead.  Pt reports "I think only one spot is bleeding, that I could see."

## 2019-02-19 NOTE — ED Provider Notes (Signed)
[facility] DEPT Provider Note   CSN: [id number] Arrival date & time: 02/19/19  1007     History Chief Complaint  Patient presents with  . uncontrolled bleeding  . Facial Laceration    [facility] is a 79 y.o. male.  Patient indicates this AM started bleeding from recent dermatology excision site to left forehead. States he had taken bandage off, and a small pinpoint stream of blood shot across room. Symptoms acute onset, moderate, persistent, improved w pressure/pressure dressing. EMS was called, was unable to stop, but was able to control with pressure to area. Patient denies any other abnormal bruising or bleeding. No anticoag use. No faintness or lightheadedness. No sob. No fevers.   The history is provided by the patient and the EMS personnel.       Past Medical History:  Diagnosis Date  . Balanitis   . Bladder calculus   . Coronary artery disease    stents x3 by Dr Tamala Julian 1998  . Depression   . Diabetes mellitus without complication ([place])   . GERD (gastroesophageal reflux disease)   . Hypercholesterolemia   . Hypertension   . Phimosis   . Prostate cancer ([place])   . Sleep apnea    wears CPAP nightly    Patient Active Problem List   Diagnosis Date Noted  . CAD in native artery 02/22/2016  . Hyperlipidemia LDL goal <70 02/22/2016  . Malignant neoplasm of prostate ([place]) 11/04/2014  . Diabetes ([place]) 11/04/2014    Past Surgical History:  Procedure Laterality Date  . CIRCUMCISION    . CYSTOSCOPY    . HERNIA REPAIR     umbilical  . PROSTATE BIOPSY    . PROSTATE BIOPSY    . PROSTATE BIOPSY    . three cardiac stents         Family History  Problem Relation Age of Onset  . Cancer Brother        prostate  . Heart disease Brother   . Heart disease Mother   . Arrhythmia Mother   . Arrhythmia Brother     Social History   Tobacco Use  . Smoking status: Never Smoker  . Smokeless tobacco: Never Used  Substance Use Topics    . Alcohol use: No  . Drug use: No    Home Medications Prior to Admission medications   Medication Sig Start Date End Date Taking? Authorizing Provider  amLODipine (NORVASC) 10 MG tablet Take 10 mg by mouth daily.  08/30/14   [provider]  aspirin 81 MG tablet Take 81 mg by mouth daily.    [provider]  atorvastatin (LIPITOR) 40 MG tablet Take 40 mg by mouth daily.    [provider]  azelastine (ASTELIN) 0.1 % nasal spray Place into both nostrils 2 (two) times daily. Use in each nostril as directed    [provider]  benazepril (LOTENSIN) 20 MG tablet Take 20 mg by mouth daily. 12/22/15   [provider]  cholecalciferol (VITAMIN D3) 25 MCG (1000 UT) tablet Take 1,000 Units by mouth daily.    [provider]  Cyanocobalamin (VITAMIN B-12) 1000 MCG/15ML LIQD Take 1 mL by mouth daily.    [provider]  fexofenadine (ALLEGRA) 180 MG tablet Take 180 mg by mouth daily.    [provider]  fluticasone (FLONASE) 50 MCG/ACT nasal spray Place into both nostrils daily.    [provider]  glimepiride (AMARYL) 4 MG tablet Add'l Sig Add'l  Sig oral Add'l Sig 11/20/18   [provider]  hydrochlorothiazide (HYDRODIURIL) 25 MG tablet Take 25 mg by mouth daily.    [provider]  Insulin Glargine (TOUJEO MAX SOLOSTAR) 300 UNIT/ML SOPN Inject 25 Units into the skin daily.    [provider]  JANUVIA 100 MG tablet  09/15/18   [provider]  linagliptin (TRADJENTA) 5 MG TABS tablet Take 5 mg by mouth daily.    [provider]  metFORMIN (GLUCOPHAGE-XR) 500 MG 24 hr tablet Take 2 tablets by mouth twice daily 11/11/14   [provider]  nebivolol (BYSTOLIC) 10 MG tablet Take 5 mg by mouth daily.     [provider]  pantoprazole (PROTONIX) 20 MG tablet  11/20/18   [provider]  pantoprazole (PROTONIX) 40 MG tablet Take 40 mg by mouth daily.     [provider]  polyethylene glycol powder (MIRALAX) powder Take 1 Container by mouth once. One capful at night    [provider]  potassium chloride (K-DUR,KLOR-CON) 10 MEQ tablet Take 10 mEq by mouth daily.     [provider]  Probiotic Product (PROBIOTIC ADVANCED PO) Take 4 mg by mouth daily.    [provider]  tamsulosin (FLOMAX) 0.4 MG CAPS capsule Add'l Sig Add'l Sig oral Add'l Sig 11/20/18   [provider]  TOVIAZ 4 MG TB24 tablet Take 4 mg by mouth daily. 06/21/18   [provider]  triamcinolone cream (KENALOG) 0.1 % Apply 1 application topically 2 (two) times daily.    [provider]    Allergies    Amoxicillin  Review of Systems   Review of Systems  Constitutional: Negative for fever.  HENT: Negative for nosebleeds.   Eyes: Negative for redness.  Respiratory: Negative for shortness of breath.   Cardiovascular: Negative for chest pain.  Gastrointestinal: Negative for blood in stool.  Genitourinary: Negative for hematuria.  Musculoskeletal: Negative for neck pain.  Skin: Positive for wound.  Neurological: Negative for light-headedness.  Hematological: Does not bruise/bleed easily.  Psychiatric/Behavioral: Negative for confusion.    Physical Exam Updated Vital Signs BP (!) 106/95   Pulse 66   Temp 98.2 F (36.8 C) (Oral)   Resp 19   SpO2 100%   Physical Exam Vitals and nursing note reviewed.  Constitutional:      Appearance: Normal appearance. He is well-developed.  HENT:     Head: Atraumatic.     Comments: Recent dermatologic procedure/open wound to left forehead. At/near superior border of open wound is small vessel that is actively bleeding/shooting 2-3 feet when bandage removed.     Nose: Nose normal.     Mouth/Throat:     Mouth: Mucous membranes are moist.     Pharynx: Oropharynx is clear.  Eyes:     General: No scleral icterus.    Conjunctiva/sclera: Conjunctivae normal.  Neck:      Trachea: No tracheal deviation.  Cardiovascular:     Rate and Rhythm: Normal rate.     Pulses: Normal pulses.  Pulmonary:     Effort: Pulmonary effort is normal. No accessory muscle usage or respiratory distress.  Abdominal:     General: There is no distension.     Palpations: Abdomen is soft.     Tenderness: There is no abdominal tenderness.  Genitourinary:    Comments: No cva tenderness. Musculoskeletal:        General: No swelling.     Cervical back: Normal range of motion and  neck supple.  Skin:    General: Skin is warm and dry.     Findings: No rash.     Comments: No petechia or purpura.   Neurological:     Mental Status: He is alert.     Comments: Alert, speech clear.   Psychiatric:        Mood and Affect: Mood normal.     ED Results / Procedures / Treatments   Labs (all labs ordered are listed, but only abnormal results are displayed) Labs Reviewed - No data to display  EKG None  Radiology No results found.  Procedures Procedures (including critical care time)  Medications Ordered in ED Medications  bacitracin ointment (has no administration in time range)  lidocaine (XYLOCAINE) 2 % injection 10 mL (10 mLs Intradermal Given by Other 02/19/19 1125)    ED Course  I have reviewed the triage vital signs and the nursing notes.  Pertinent labs & imaging results that were available during my care of the patient were reviewed by me and considered in my medical decision making (see chart for details).    MDM Rules/Calculators/A&P                      After pressure held and pressure dressing, as soon as bandage removed, pinpoint sized area actively bleeds - shots a few feet in air.   Area prepped, single suture placed for hemostasis.   Wound left open, pt observed. No recurrent bleeding or hematoma accumulation.   Bacitracin and non adherent dressing.   Discussed w pt, should bleeding recur, hold single finger tip firmly on the bleeding spot without  letting up, and calling for medical attention should bleeding persist/recur.     Final Clinical Impression(s) / ED Diagnoses Final diagnoses:  None    Rx / DC Orders ED Discharge Orders    None       Lajean Saver, MD 02/19/19 1152

## 2019-02-19 NOTE — ED Triage Notes (Signed)
Pt BIBA from home.   Per EMS- Pt reports skin cancer removal to left forehead two weeks ago.  Pt reports changing the bandage this AM when the wound started bleeding appx 0920; unable to control bleeding at home.  Pt denies taking blood thinners.

## 2019-02-19 NOTE — ED Notes (Signed)
ED Provider at bedside. 

## 2019-02-19 NOTE — ED Notes (Signed)
Discharge paperwork reviewed with pt.  Pt with questions regarding whether to f/u with MD who performed skin cancer surgery.  This RN encouraged pt to contact that provider to ensure provider knew he was in ED today and to determine f/u care with that provider.  Pt verbalized understanding, reports that wife is going to pick him up.  Pt ambulatory at discharge.

## 2019-02-19 NOTE — ED Notes (Signed)
Pt provided with graham crackers, peanut butter, water.

## 2019-03-20 DIAGNOSIS — N401 Enlarged prostate with lower urinary tract symptoms: Secondary | ICD-10-CM | POA: Diagnosis not present

## 2019-03-20 DIAGNOSIS — E1142 Type 2 diabetes mellitus with diabetic polyneuropathy: Secondary | ICD-10-CM | POA: Diagnosis not present

## 2019-03-20 DIAGNOSIS — K219 Gastro-esophageal reflux disease without esophagitis: Secondary | ICD-10-CM | POA: Diagnosis not present

## 2019-03-20 DIAGNOSIS — I10 Essential (primary) hypertension: Secondary | ICD-10-CM | POA: Diagnosis not present

## 2019-03-28 NOTE — Progress Notes (Signed)
Cardiology Office Note:    Date:  03/29/2019   ID:  [facility], DOB 12-23-1940, MRN [id number]  PCP:  [name], MD  Cardiologist:  Sinclair Grooms, MD   Referring MD: [name], MD   Chief Complaint  Patient presents with  . Coronary Artery Disease    History of Present Illness:    [facility] is a 79 y.o. male with a hx of CAD with stent to LAD and RCA proximal and mid stents in 1998 and 1999 then cardiac cath in 2005 with patent coronary arteries. EF 45 to 50% 2017.  He voices no complaints.  He denies orthopnea and PND.  He has not had lower extremity swelling, angina, palpitations, need for nitroglycerin, syncope, or claudication.  He is compliant with his medication regimen.  Fatigue and shortness of breath limits physical activity duration, but this is not different than it was a year ago.  Past Medical History:  Diagnosis Date  . Balanitis   . Bladder calculus   . Coronary artery disease    stents x3 by Dr Tamala Julian 1998  . Depression   . Diabetes mellitus without complication ([place])   . GERD (gastroesophageal reflux disease)   . Hypercholesterolemia   . Hypertension   . Phimosis   . Prostate cancer ([place])   . Sleep apnea    wears CPAP nightly    Past Surgical History:  Procedure Laterality Date  . CIRCUMCISION    . CYSTOSCOPY    . HERNIA REPAIR     umbilical  . PROSTATE BIOPSY    . PROSTATE BIOPSY    . PROSTATE BIOPSY    . three cardiac stents      Current Medications: Current Meds  Medication Sig  . amLODipine (NORVASC) 10 MG tablet Take 10 mg by mouth daily.   Marland Kitchen aspirin 81 MG tablet Take 81 mg by mouth daily.  Marland Kitchen atorvastatin (LIPITOR) 40 MG tablet Take 40 mg by mouth daily.  Marland Kitchen azelastine (ASTELIN) 0.1 % nasal spray Place into both nostrils 2 (two) times daily. Use in each nostril as directed  . benazepril (LOTENSIN) 20 MG tablet Take 20 mg by mouth daily.  . cholecalciferol (VITAMIN D3) 25 MCG (1000 UT) tablet Take 1,000 Units by mouth  daily.  . fexofenadine (ALLEGRA) 180 MG tablet Take 180 mg by mouth daily.  . fluticasone (FLONASE) 50 MCG/ACT nasal spray Place into both nostrils daily.  . hydrochlorothiazide (HYDRODIURIL) 25 MG tablet Take 25 mg by mouth daily.  . Insulin Glargine (TOUJEO MAX SOLOSTAR) 300 UNIT/ML SOPN Inject 25 Units into the skin daily.  Marland Kitchen JANUVIA 100 MG tablet   . linagliptin (TRADJENTA) 5 MG TABS tablet Take 5 mg by mouth daily.  . metFORMIN (GLUCOPHAGE-XR) 500 MG 24 hr tablet Take 2 tablets by mouth twice daily  . nebivolol (BYSTOLIC) 10 MG tablet Take 5 mg by mouth daily.   . pantoprazole (PROTONIX) 20 MG tablet   . polyethylene glycol powder (MIRALAX) powder Take 1 Container by mouth once. One capful at night  . potassium chloride (K-DUR,KLOR-CON) 10 MEQ tablet Take 10 mEq by mouth daily.   . Probiotic Product (PROBIOTIC ADVANCED PO) Take 4 mg by mouth daily.  . TOVIAZ 4 MG TB24 tablet Take 4 mg by mouth daily.  Marland Kitchen triamcinolone cream (KENALOG) 0.1 % Apply 1 application topically 2 (two) times daily.  . vitamin B-12 (CYANOCOBALAMIN) 1000 MCG tablet Take 1,000 mcg by mouth daily.  Allergies:   Amoxicillin   Social History   Socioeconomic History  . Marital status: Married    Spouse name: Not on file  . Number of children: Not on file  . Years of education: Not on file  . Highest education level: Not on file  Occupational History  . Not on file  Tobacco Use  . Smoking status: Never Smoker  . Smokeless tobacco: Never Used  Substance and Sexual Activity  . Alcohol use: No  . Drug use: No  . Sexual activity: Yes  Other Topics Concern  . Not on file  Social History Narrative  . Not on file   Social Determinants of Health   Financial Resource Strain:   . Difficulty of Paying Living Expenses:   Food Insecurity:   . Worried About Charity fundraiser in the Last Year:   . Arboriculturist in the Last Year:   Transportation Needs:   . Film/video editor (Medical):   Marland Kitchen Lack of  Transportation (Non-Medical):   Physical Activity:   . Days of Exercise per Week:   . Minutes of Exercise per Session:   Stress:   . Feeling of Stress :   Social Connections:   . Frequency of Communication with Friends and Family:   . Frequency of Social Gatherings with Friends and Family:   . Attends Religious Services:   . Active Member of Clubs or Organizations:   . Attends Archivist Meetings:   Marland Kitchen Marital Status:      Family History: The patient's family history includes Arrhythmia in his brother and mother; Cancer in his brother; Heart disease in his brother and mother.  ROS:   Please see the history of present illness.    He received the COVID-19 vaccine.  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  All other systems reviewed and are negative.  EKGs/Labs/Other Studies Reviewed:    The following studies were reviewed today:  2D Doppler echocardiogram 2020: IMPRESSIONS    1. The left ventricle has mildly reduced systolic function, with an  ejection fraction of 45-50%. The cavity size was moderately dilated. There  is mild concentric left ventricular hypertrophy. Left ventricular  diastolic Doppler parameters are consistent  with impaired relaxation. Indeterminate filling pressures.  2. Mild hypokinesis of the inferolateral myocardium.  3. The right ventricle has normal systolic function. The cavity was  normal. There is no increase in right ventricular wall thickness.    EKG:  EKG normal sinus rhythm with interventricular conduction delay, poor R wave progression V1 through V6, left axis deviation.  When compared to prior tracing, no new findings are noted.  Recent Labs: No results found for requested labs within last 8760 hours.  Recent Lipid Panel No results found for: CHOL, TRIG, HDL, CHOLHDL, VLDL, LDLCALC, LDLDIRECT  Physical Exam:    VS:  BP 128/68   Pulse 68   Ht 5\' 8"  (1.727 m)   Wt 225 lb 1.9 oz (102.1 kg)   SpO2 98%    BMI 34.23 kg/m     Wt Readings from Last 3 Encounters:  03/29/19 225 lb 1.9 oz (102.1 kg)  03/16/18 224 lb 9.6 oz (101.9 kg)  03/13/18 227 lb (103 kg)     GEN: Obese and elderly in appearance. No acute distress HEENT: Normal NECK: No JVD. LYMPHATICS: No lymphadenopathy CARDIAC:  RRR without murmur, gallop, or edema. VASCULAR:  Normal Pulses. No bruits. RESPIRATORY:  Clear to auscultation without rales,  wheezing or rhonchi  ABDOMEN: Soft, non-tender, non-distended, No pulsatile mass, MUSCULOSKELETAL: No deformity  SKIN: Warm and dry NEUROLOGIC:  Alert and oriented x 3 PSYCHIATRIC:  Normal affect   ASSESSMENT:    1. CAD in native artery   2. Hyperlipidemia LDL goal <70   3. Type 2 diabetes mellitus with other circulatory complication, without long-term current use of insulin ([place])   4. Essential hypertension   5. Abnormal nuclear cardiac imaging test   6. Educated about COVID-19 virus infection    PLAN:    In order of problems listed above:  1. Very stable coronary disease since instances of coronary stenting 22 years ago.  A brief discussion of secondary prevention including encouragement towards physical activity occurred. 2. LDL target less than 70 and goal.  Most recent and July 2020 was 54.  Continue statin therapy, Lipitor 40 mg/day.. 3. Continue efforts for glycemic control.  Most recent A1c was 7.1. 4. Continue losartan, atenolol, HCTZ, and diastolic.  At his age we may need to consider starting to trend his medications back as his systolic pressure may be better controlled than needed. 5. Not discussed 6. Has received both doses of the COVID-19 vaccine.  He is encouraged to continue to practice social distancing.  Overall education and awareness concerning primary/secondary risk prevention was discussed in detail: LDL less than 70, hemoglobin A1c less than 7, blood pressure target less than 130/80 mmHg, >150 minutes of moderate aerobic activity per week, avoidance  of smoking, weight control (via diet and exercise), and continued surveillance/management of/for obstructive sleep apnea.    Medication Adjustments/Labs and Tests Ordered: Current medicines are reviewed at length with the patient today.  Concerns regarding medicines are outlined above.  Orders Placed This Encounter  Procedures  . EKG 12-Lead   No orders of the defined types were placed in this encounter.   Patient Instructions  Medication Instructions:  Your physician recommends that you continue on your current medications as directed. Please refer to the Current Medication list given to you today.  *If you need a refill on your cardiac medications before your next appointment, please call your pharmacy*   Lab Work: None If you have labs (blood work) drawn today and your tests are completely normal, you will receive your results only by: Marland Kitchen MyChart Message (if you have MyChart) OR . A paper copy in the mail If you have any lab test that is abnormal or we need to change your treatment, we will call you to review the results.   Testing/Procedures: None   Follow-Up: At [facility], you and your health needs are our priority.  As part of our continuing mission to provide you with exceptional heart care, we have created designated Provider Care Teams.  These Care Teams include your primary Cardiologist (physician) and Advanced Practice Providers (APPs -  Physician Assistants and Nurse Practitioners) who all work together to provide you with the care you need, when you need it.  We recommend signing up for the patient portal called "MyChart".  Sign up information is provided on this After Visit Summary.  MyChart is used to connect with patients for Virtual Visits (Telemedicine).  Patients are able to view lab/test results, encounter notes, upcoming appointments, etc.  Non-urgent messages can be sent to your provider as well.   To learn more about what you can do with MyChart, go to  [email or web address].    Your next appointment:   12 month(s)  The format for your next appointment:  In Person  Provider:   You may see Sinclair Grooms, MD or one of the following Advanced Practice Providers on your designated Care Team:    Truitt Merle, NP  Cecilie Kicks, NP  Kathyrn Drown, NP    Other Instructions      Signed, Sinclair Grooms, MD  03/29/2019 11:40 AM    [place]

## 2019-03-29 ENCOUNTER — Ambulatory Visit: Payer: Medicare Other | Admitting: Interventional Cardiology

## 2019-03-29 ENCOUNTER — Encounter: Payer: Self-pay | Admitting: Interventional Cardiology

## 2019-03-29 ENCOUNTER — Other Ambulatory Visit: Payer: Self-pay

## 2019-03-29 VITALS — BP 128/68 | HR 68 | Ht 68.0 in | Wt 225.1 lb

## 2019-03-29 DIAGNOSIS — E1159 Type 2 diabetes mellitus with other circulatory complications: Secondary | ICD-10-CM | POA: Diagnosis not present

## 2019-03-29 DIAGNOSIS — I10 Essential (primary) hypertension: Secondary | ICD-10-CM | POA: Diagnosis not present

## 2019-03-29 DIAGNOSIS — R931 Abnormal findings on diagnostic imaging of heart and coronary circulation: Secondary | ICD-10-CM

## 2019-03-29 DIAGNOSIS — E785 Hyperlipidemia, unspecified: Secondary | ICD-10-CM

## 2019-03-29 DIAGNOSIS — Z7189 Other specified counseling: Secondary | ICD-10-CM

## 2019-03-29 DIAGNOSIS — I2510 Atherosclerotic heart disease of native coronary artery without angina pectoris: Secondary | ICD-10-CM

## 2019-03-29 NOTE — Patient Instructions (Signed)

## 2019-05-01 ENCOUNTER — Encounter: Payer: Self-pay | Admitting: Podiatry

## 2019-05-01 ENCOUNTER — Ambulatory Visit: Payer: Medicare Other | Admitting: Podiatry

## 2019-05-01 ENCOUNTER — Other Ambulatory Visit: Payer: Self-pay

## 2019-05-01 VITALS — Temp 97.3°F

## 2019-05-01 DIAGNOSIS — M79674 Pain in right toe(s): Secondary | ICD-10-CM | POA: Diagnosis not present

## 2019-05-01 DIAGNOSIS — E11610 Type 2 diabetes mellitus with diabetic neuropathic arthropathy: Secondary | ICD-10-CM

## 2019-05-01 DIAGNOSIS — M79675 Pain in left toe(s): Secondary | ICD-10-CM | POA: Diagnosis not present

## 2019-05-01 DIAGNOSIS — L600 Ingrowing nail: Secondary | ICD-10-CM

## 2019-05-01 DIAGNOSIS — E1142 Type 2 diabetes mellitus with diabetic polyneuropathy: Secondary | ICD-10-CM | POA: Diagnosis not present

## 2019-05-01 DIAGNOSIS — S90421A Blister (nonthermal), right great toe, initial encounter: Secondary | ICD-10-CM | POA: Diagnosis not present

## 2019-05-01 DIAGNOSIS — B351 Tinea unguium: Secondary | ICD-10-CM

## 2019-05-01 NOTE — Patient Instructions (Addendum)
Apply Mupirocin to right great toe daily   Diabetes Mellitus and Foot Care Foot care is an important part of your health, especially when you have diabetes. Diabetes may cause you to have problems because of poor blood flow (circulation) to your feet and legs, which can cause your skin to:  Become thinner and drier.  Break more easily.  Heal more slowly.  Peel and crack. You may also have nerve damage (neuropathy) in your legs and feet, causing decreased feeling in them. This means that you may not notice minor injuries to your feet that could lead to more serious problems. Noticing and addressing any potential problems early is the best way to prevent future foot problems. How to care for your feet Foot hygiene  Wash your feet daily with warm water and mild soap. Do not use hot water. Then, pat your feet and the areas between your toes until they are completely dry. Do not soak your feet as this can dry your skin.  Trim your toenails straight across. Do not dig under them or around the cuticle. File the edges of your nails with an emery board or nail file.  Apply a moisturizing lotion or petroleum jelly to the skin on your feet and to dry, brittle toenails. Use lotion that does not contain alcohol and is unscented. Do not apply lotion between your toes. Shoes and socks  Wear clean socks or stockings every day. Make sure they are not too tight. Do not wear knee-high stockings since they may decrease blood flow to your legs.  Wear shoes that fit properly and have enough cushioning. Always look in your shoes before you put them on to be sure there are no objects inside.  To break in new shoes, wear them for just a few hours a day. This prevents injuries on your feet. Wounds, scrapes, corns, and calluses  Check your feet daily for blisters, cuts, bruises, sores, and redness. If you cannot see the bottom of your feet, use a mirror or ask someone for help.  Do not cut corns or calluses  or try to remove them with medicine.  If you find a minor scrape, cut, or break in the skin on your feet, keep it and the skin around it clean and dry. You may clean these areas with mild soap and water. Do not clean the area with peroxide, alcohol, or iodine.  If you have a wound, scrape, corn, or callus on your foot, look at it several times a day to make sure it is healing and not infected. Check for: ? Redness, swelling, or pain. ? Fluid or blood. ? Warmth. ? Pus or a bad smell. General instructions  Do not cross your legs. This may decrease blood flow to your feet.  Do not use heating pads or hot water bottles on your feet. They may burn your skin. If you have lost feeling in your feet or legs, you may not know this is happening until it is too late.  Protect your feet from hot and cold by wearing shoes, such as at the beach or on hot pavement.  Schedule a complete foot exam at least once a year (annually) or more often if you have foot problems. If you have foot problems, report any cuts, sores, or bruises to your health care provider immediately. Contact a health care provider if:  You have a medical condition that increases your risk of infection and you have any cuts, sores, or bruises on your  feet.  You have an injury that is not healing.  You have redness on your legs or feet.  You feel burning or tingling in your legs or feet.  You have pain or cramps in your legs and feet.  Your legs or feet are numb.  Your feet always feel cold.  You have pain around a toenail. Get help right away if:  You have a wound, scrape, corn, or callus on your foot and: ? You have pain, swelling, or redness that gets worse. ? You have fluid or blood coming from the wound, scrape, corn, or callus. ? Your wound, scrape, corn, or callus feels warm to the touch. ? You have pus or a bad smell coming from the wound, scrape, corn, or callus. ? You have a fever. ? You have a red line going up  your leg. Summary  Check your feet every day for cuts, sores, red spots, swelling, and blisters.  Moisturize feet and legs daily.  Wear shoes that fit properly and have enough cushioning.  If you have foot problems, report any cuts, sores, or bruises to your health care provider immediately.  Schedule a complete foot exam at least once a year (annually) or more often if you have foot problems. This information is not intended to replace advice given to you by your health care provider. Make sure you discuss any questions you have with your health care provider. Document Revised: 09/13/2018 Document Reviewed: 01/23/2016 Elsevier Patient Education  [place].

## 2019-05-01 NOTE — Progress Notes (Signed)
Subjective: Wesley Winters presents today for follow up of preventative diabetic foot care and painful mycotic nails b/l that are difficult to trim. Pain interferes with ambulation. Aggravating factors include wearing enclosed shoe gear. Pain is relieved with periodic professional debridement.   Allergies  Allergen Reactions  . Amoxicillin Hives and Nausea Only     Objective: Vitals:   05/01/19 0829  Temp: (!) 97.3 F (36.3 C)    Pt is a pleasant 79 y.o. year old Caucasian male in NAD. AAO x 3.   Vascular Examination:  Capillary refill time to digits immediate b/l. Faintly palpable PT pulses b/l. DP pulse palpable left foot. DP pulse faintly palpable right foot. Pedal hair present b/l. Skin temperature gradient within normal limits b/l. Trace edema noted b/l feet.  Dermatological Examination: Pedal skin with normal turgor, texture and tone bilaterally. No open wounds bilaterally. No interdigital macerations bilaterally. Toenails 1-5 b/l elongated, dystrophic, thickened, crumbly with subungual debris and tenderness to dorsal palpation.   Superficial longitudinal blister noted along plantarlateral aspect right hallux. Does not communicate with ingrown toenail. No erythema, no edema, no warmth, no purulence. Clear, thin fluid expressed.  Incurvated nailplate right great toe lateral border(s) with border hypertrophy.  No erythema, no edema, no drainage noted.  Musculoskeletal: Normal muscle strength 5/5 to all lower extremity muscle groups bilaterally. No pain crepitus or joint limitation noted with ROM b/l. Hammertoes noted to the L 2nd toe and R 3rd toe. Charcot deformity noted right plantar midfoot.    Neurological: Protective sensation diminished with 10g monofilament b/l. Vibratory sensation diminished b/l. Babinski reflex negative b/l. Clonus negative b/l.  Assessment: 1. Pain due to onychomycosis of toenails of both feet   2. Ingrown toenail without infection   3. Blister of  great toe of right foot, initial encounter   4. Diabetic Charcot's foot ([name])   5. Diabetic peripheral neuropathy associated with type 2 diabetes mellitus (HCC)    Plan: -Toenails 1-5 b/l were debrided in length and girth with sterile nail nippers and dremel without iatrogenic bleeding.   -Will schedule with our Orthotics/Prosthetics Dept for new diabetic shoes. -Offending nail border debrided and curretaged right hallux. Border cleansed with alcohol and triple antibiotic applied. He still has a tube of Mupirocin Ointment at home from previous prescription. Written instructions dispensed for once daily application of Mupirocin Ointment and band-aid. His wife will assist him with this.  -Blister right great toe lanced with sterile blade. Cleansed with alcohol. Triple antibiotic ointment and band-aid applied. He is to apply Mupirocin Ointment and band-aid once daily.  -Patient to continue soft, supportive shoe gear daily. -Patient to report any pedal injuries to medical professional immediately.  -Patient/POA to call should there be question/concern in the interim.  Return in about 1 week (around 05/08/2019) for right nail check.

## 2019-05-04 DIAGNOSIS — N400 Enlarged prostate without lower urinary tract symptoms: Secondary | ICD-10-CM | POA: Diagnosis not present

## 2019-05-04 DIAGNOSIS — I10 Essential (primary) hypertension: Secondary | ICD-10-CM | POA: Diagnosis not present

## 2019-05-04 DIAGNOSIS — E1142 Type 2 diabetes mellitus with diabetic polyneuropathy: Secondary | ICD-10-CM | POA: Diagnosis not present

## 2019-05-04 DIAGNOSIS — I2510 Atherosclerotic heart disease of native coronary artery without angina pectoris: Secondary | ICD-10-CM | POA: Diagnosis not present

## 2019-05-08 ENCOUNTER — Other Ambulatory Visit: Payer: Self-pay

## 2019-05-08 ENCOUNTER — Ambulatory Visit (INDEPENDENT_AMBULATORY_CARE_PROVIDER_SITE_OTHER): Payer: Medicare Other | Admitting: Podiatry

## 2019-05-08 ENCOUNTER — Ambulatory Visit: Payer: Medicare Other | Admitting: Orthotics

## 2019-05-08 ENCOUNTER — Encounter: Payer: Self-pay | Admitting: Podiatry

## 2019-05-08 VITALS — Temp 97.4°F

## 2019-05-08 DIAGNOSIS — E1142 Type 2 diabetes mellitus with diabetic polyneuropathy: Secondary | ICD-10-CM

## 2019-05-08 DIAGNOSIS — E11610 Type 2 diabetes mellitus with diabetic neuropathic arthropathy: Secondary | ICD-10-CM

## 2019-05-08 DIAGNOSIS — L02611 Cutaneous abscess of right foot: Secondary | ICD-10-CM

## 2019-05-08 DIAGNOSIS — L03031 Cellulitis of right toe: Secondary | ICD-10-CM

## 2019-05-08 DIAGNOSIS — E119 Type 2 diabetes mellitus without complications: Secondary | ICD-10-CM

## 2019-05-08 MED ORDER — DOXYCYCLINE HYCLATE 100 MG PO CAPS: 100.0000 mg | ORAL_CAPSULE | Freq: Two times a day (BID) | ORAL | 0 refills | Status: AC

## 2019-05-08 NOTE — Patient Instructions (Signed)
Ingrown Toenail An ingrown toenail occurs when the corner or sides of a toenail grow into the surrounding skin. This causes discomfort and pain. The big toe is most commonly affected, but any of the toes can be affected. If an ingrown toenail is not treated, it can become infected. What are the causes? This condition may be caused by:  Wearing shoes that are too small or tight.  An injury, such as stubbing your toe or having your toe stepped on.  Improper cutting or care of your toenails.  Having nail or foot abnormalities that were present from birth (congenital abnormalities), such as having a nail that is too big for your toe. What increases the risk? The following factors may make you more likely to develop ingrown toenails:  Age. Nails tend to get thicker with age, so ingrown nails are more common among older people.  Cutting your toenails incorrectly, such as cutting them very short or cutting them unevenly. An ingrown toenail is more likely to get infected if you have:  Diabetes.  Blood flow (circulation) problems. What are the signs or symptoms? Symptoms of an ingrown toenail may include:  Pain, soreness, or tenderness.  Redness.  Swelling.  Hardening of the skin that surrounds the toenail. Signs that an ingrown toenail may be infected include:  Fluid or pus.  Symptoms that get worse instead of better. How is this diagnosed? An ingrown toenail may be diagnosed based on your medical history, your symptoms, and a physical exam. If you have fluid or blood coming from your toenail, a sample may be collected to test for the specific type of bacteria that is causing the infection. How is this treated? Treatment depends on how severe your ingrown toenail is. You may be able to care for your toenail at home.  If you have an infection, you may be prescribed antibiotic medicines.  If you have fluid or pus draining from your toenail, your health care provider may drain  it.  If you have trouble walking, you may be given crutches to use.  If you have a severe or infected ingrown toenail, you may need a procedure to remove part or all of the nail. Follow these instructions at home: Foot care   Do not pick at your toenail or try to remove it yourself.  Soak your foot in warm, soapy water. Do this for 20 minutes, 3 times a day, or as often as told by your health care provider. This helps to keep your toe clean and keep your skin soft.  Wear shoes that fit well and are not too tight. Your health care provider may recommend that you wear open-toed shoes while you heal.  Trim your toenails regularly and carefully. Cut your toenails straight across to prevent injury to the skin at the corners of the toenail. Do not cut your nails in a curved shape.  Keep your feet clean and dry to help prevent infection. Medicines  Take over-the-counter and prescription medicines only as told by your health care provider.  If you were prescribed an antibiotic, take it as told by your health care provider. Do not stop taking the antibiotic even if you start to feel better. Activity  Return to your normal activities as told by your health care provider. Ask your health care provider what activities are safe for you.  Avoid activities that cause pain. General instructions  If your health care provider told you to use crutches to help you move around, use them   as instructed.  Keep all follow-up visits as told by your health care provider. This is important. Contact a health care provider if:  You have more redness, swelling, pain, or other symptoms that do not improve with treatment.  You have fluid, blood, or pus coming from your toenail. Get help right away if:  You have a red streak on your skin that starts at your foot and spreads up your leg.  You have a fever. Summary  An ingrown toenail occurs when the corner or sides of a toenail grow into the surrounding  skin. This causes discomfort and pain. The big toe is most commonly affected, but any of the toes can be affected.  If an ingrown toenail is not treated, it can become infected.  Fluid or pus draining from your toenail is a sign of infection. Your health care provider may need to drain it. You may be given antibiotics to treat the infection.  Trimming your toenails regularly and properly can help you prevent an ingrown toenail. This information is not intended to replace advice given to you by your health care provider. Make sure you discuss any questions you have with your health care provider. Document Revised: 04/14/2018 Document Reviewed: 09/08/2016 Elsevier Patient Education  2020 [employer]  

## 2019-05-08 NOTE — Progress Notes (Signed)

## 2019-05-08 NOTE — Progress Notes (Signed)
Subjective: Wesley Winters presents today for follow up of blister right great toe. Blister has resolved, but now he has a red spot on the top of his right great toe. He denies any bug bites or trauma. Denies any pain as he has very little feeling in his feet. He denies any drainage or swelling. He says it appeared the next day after his visit.  He will also be seeing Liliane Channel to be measured for his diabetic shoes on today.  Allergies  Allergen Reactions  . Amoxicillin Hives and Nausea Only    Objective: Vitals:   05/08/19 0800  Temp: (!) 97.4 F (36.3 C)   Pt is an 79 y.o. year old Caucasian male in NAD. AAO x 3.   Vascular Examination:  Capillary refill time to digits immediate b/l. Faintly palpable PT pulses b/l. DP pulse palpable left foot. DP pulse faintly palpable right foot. Pedal hair present b/l. Skin temperature gradient within normal limits b/l. Trace edema noted b/l feet.  Dermatological Examination: Pedal skin with normal turgor, texture and tone bilaterally. No open wounds bilaterally. No interdigital macerations bilaterally. Toenails 1-5 b/l elongated, dystrophic, thickened, crumbly with subungual debris and tenderness to dorsal palpation. Blister is resolved right hallux. He now has a 1 x 0.5 cm erythematous area on the dorsomedial aspect of the hallux. No flocculence, no warmth, no edema, no pain on palpation. There seems to be no entrance point for bug bite.  Musculoskeletal: Normal muscle strength 5/5 to all lower extremity muscle groups bilaterally. No pain crepitus or joint limitation noted with ROM b/l. Hammertoes noted to the L 2nd toe and R 3rd toe.  Neurological: Protective sensation diminished with 10g monofilament b/l. Vibratory sensation diminished b/l.  Assessment: 1. Cellulitis and abscess of toe of right foot    Plan: -Patient to continue soft, supportive shoe gear daily. -Patient to report any pedal injuries to medical professional immediately. -No  etiology for the cellulitis, but will send in Rx for Doxycyline 100 mg, #14, to be taken twice daily for 7 days. Call if condition worsens. -Patient/POA to call should there be question/concern in the interim.  Return in 8 weeks (on 07/03/2019) for diabetic nail trim.  Marzetta Board, DPM

## 2019-05-09 DIAGNOSIS — K219 Gastro-esophageal reflux disease without esophagitis: Secondary | ICD-10-CM | POA: Diagnosis not present

## 2019-05-21 DIAGNOSIS — L821 Other seborrheic keratosis: Secondary | ICD-10-CM | POA: Diagnosis not present

## 2019-05-21 DIAGNOSIS — D229 Melanocytic nevi, unspecified: Secondary | ICD-10-CM | POA: Diagnosis not present

## 2019-05-21 DIAGNOSIS — L905 Scar conditions and fibrosis of skin: Secondary | ICD-10-CM | POA: Diagnosis not present

## 2019-05-21 DIAGNOSIS — D1801 Hemangioma of skin and subcutaneous tissue: Secondary | ICD-10-CM | POA: Diagnosis not present

## 2019-05-22 DIAGNOSIS — K5909 Other constipation: Secondary | ICD-10-CM | POA: Diagnosis not present

## 2019-05-22 DIAGNOSIS — K219 Gastro-esophageal reflux disease without esophagitis: Secondary | ICD-10-CM | POA: Diagnosis not present

## 2019-05-25 DIAGNOSIS — I10 Essential (primary) hypertension: Secondary | ICD-10-CM | POA: Diagnosis not present

## 2019-05-25 DIAGNOSIS — E1142 Type 2 diabetes mellitus with diabetic polyneuropathy: Secondary | ICD-10-CM | POA: Diagnosis not present

## 2019-05-25 DIAGNOSIS — I2510 Atherosclerotic heart disease of native coronary artery without angina pectoris: Secondary | ICD-10-CM | POA: Diagnosis not present

## 2019-05-25 DIAGNOSIS — N400 Enlarged prostate without lower urinary tract symptoms: Secondary | ICD-10-CM | POA: Diagnosis not present

## 2019-06-11 DIAGNOSIS — C61 Malignant neoplasm of prostate: Secondary | ICD-10-CM | POA: Diagnosis not present

## 2019-06-15 DIAGNOSIS — E1149 Type 2 diabetes mellitus with other diabetic neurological complication: Secondary | ICD-10-CM | POA: Diagnosis not present

## 2019-06-15 DIAGNOSIS — E782 Mixed hyperlipidemia: Secondary | ICD-10-CM | POA: Diagnosis not present

## 2019-06-15 DIAGNOSIS — I10 Essential (primary) hypertension: Secondary | ICD-10-CM | POA: Diagnosis not present

## 2019-06-15 DIAGNOSIS — E1142 Type 2 diabetes mellitus with diabetic polyneuropathy: Secondary | ICD-10-CM | POA: Diagnosis not present

## 2019-06-18 DIAGNOSIS — N471 Phimosis: Secondary | ICD-10-CM | POA: Diagnosis not present

## 2019-06-18 DIAGNOSIS — Z8546 Personal history of malignant neoplasm of prostate: Secondary | ICD-10-CM | POA: Diagnosis not present

## 2019-06-18 DIAGNOSIS — R350 Frequency of micturition: Secondary | ICD-10-CM | POA: Diagnosis not present

## 2019-07-04 ENCOUNTER — Other Ambulatory Visit: Payer: Self-pay | Admitting: Gastroenterology

## 2019-07-04 DIAGNOSIS — K5909 Other constipation: Secondary | ICD-10-CM | POA: Diagnosis not present

## 2019-07-04 DIAGNOSIS — R5383 Other fatigue: Secondary | ICD-10-CM | POA: Diagnosis not present

## 2019-07-04 DIAGNOSIS — Z8679 Personal history of other diseases of the circulatory system: Secondary | ICD-10-CM | POA: Diagnosis not present

## 2019-07-04 DIAGNOSIS — K219 Gastro-esophageal reflux disease without esophagitis: Secondary | ICD-10-CM | POA: Diagnosis not present

## 2019-07-06 ENCOUNTER — Other Ambulatory Visit (HOSPITAL_COMMUNITY)
Admission: RE | Admit: 2019-07-06 | Discharge: 2019-07-06 | Disposition: A | Discharge status: Home / Self Care | Payer: Medicare Other | Source: Ambulatory Visit | Attending: Gastroenterology | Admitting: Gastroenterology

## 2019-07-06 DIAGNOSIS — Z01812 Encounter for preprocedural laboratory examination: Secondary | ICD-10-CM | POA: Diagnosis not present

## 2019-07-06 DIAGNOSIS — Z20822 Contact with and (suspected) exposure to covid-19: Secondary | ICD-10-CM | POA: Insufficient documentation

## 2019-07-06 LAB — SARS CORONAVIRUS 2 (TAT 6-24 HRS): SARS Coronavirus 2: NEGATIVE

## 2019-07-10 ENCOUNTER — Ambulatory Visit (HOSPITAL_COMMUNITY)
Admission: RE | Admit: 2019-07-10 | Discharge: 2019-07-10 | Disposition: A | Discharge status: Home / Self Care | Payer: Medicare Other | Attending: Gastroenterology | Admitting: Gastroenterology

## 2019-07-10 ENCOUNTER — Ambulatory Visit (HOSPITAL_COMMUNITY): Payer: Medicare Other | Admitting: Certified Registered"

## 2019-07-10 ENCOUNTER — Encounter (HOSPITAL_COMMUNITY): Payer: Self-pay | Admitting: Gastroenterology

## 2019-07-10 ENCOUNTER — Encounter (HOSPITAL_COMMUNITY)
Admission: RE | Disposition: A | Discharge status: Home / Self Care | Payer: Self-pay | Source: Home / Self Care | Attending: Gastroenterology

## 2019-07-10 ENCOUNTER — Other Ambulatory Visit: Payer: Self-pay

## 2019-07-10 DIAGNOSIS — K222 Esophageal obstruction: Secondary | ICD-10-CM | POA: Diagnosis not present

## 2019-07-10 DIAGNOSIS — K219 Gastro-esophageal reflux disease without esophagitis: Secondary | ICD-10-CM | POA: Insufficient documentation

## 2019-07-10 DIAGNOSIS — G4733 Obstructive sleep apnea (adult) (pediatric): Secondary | ICD-10-CM | POA: Insufficient documentation

## 2019-07-10 DIAGNOSIS — Z955 Presence of coronary angioplasty implant and graft: Secondary | ICD-10-CM | POA: Insufficient documentation

## 2019-07-10 DIAGNOSIS — K3189 Other diseases of stomach and duodenum: Secondary | ICD-10-CM | POA: Diagnosis not present

## 2019-07-10 DIAGNOSIS — K449 Diaphragmatic hernia without obstruction or gangrene: Secondary | ICD-10-CM | POA: Diagnosis not present

## 2019-07-10 DIAGNOSIS — K2970 Gastritis, unspecified, without bleeding: Secondary | ICD-10-CM | POA: Insufficient documentation

## 2019-07-10 DIAGNOSIS — I5030 Unspecified diastolic (congestive) heart failure: Secondary | ICD-10-CM | POA: Diagnosis not present

## 2019-07-10 DIAGNOSIS — E1140 Type 2 diabetes mellitus with diabetic neuropathy, unspecified: Secondary | ICD-10-CM | POA: Insufficient documentation

## 2019-07-10 DIAGNOSIS — Z79899 Other long term (current) drug therapy: Secondary | ICD-10-CM | POA: Insufficient documentation

## 2019-07-10 DIAGNOSIS — K228 Other specified diseases of esophagus: Secondary | ICD-10-CM | POA: Diagnosis not present

## 2019-07-10 DIAGNOSIS — Z794 Long term (current) use of insulin: Secondary | ICD-10-CM | POA: Insufficient documentation

## 2019-07-10 DIAGNOSIS — Z7984 Long term (current) use of oral hypoglycemic drugs: Secondary | ICD-10-CM | POA: Diagnosis not present

## 2019-07-10 DIAGNOSIS — I110 Hypertensive heart disease with heart failure: Secondary | ICD-10-CM | POA: Diagnosis not present

## 2019-07-10 DIAGNOSIS — K319 Disease of stomach and duodenum, unspecified: Secondary | ICD-10-CM | POA: Insufficient documentation

## 2019-07-10 DIAGNOSIS — I509 Heart failure, unspecified: Secondary | ICD-10-CM | POA: Insufficient documentation

## 2019-07-10 DIAGNOSIS — J309 Allergic rhinitis, unspecified: Secondary | ICD-10-CM | POA: Insufficient documentation

## 2019-07-10 DIAGNOSIS — Z7982 Long term (current) use of aspirin: Secondary | ICD-10-CM | POA: Insufficient documentation

## 2019-07-10 DIAGNOSIS — G4730 Sleep apnea, unspecified: Secondary | ICD-10-CM | POA: Diagnosis not present

## 2019-07-10 DIAGNOSIS — I2510 Atherosclerotic heart disease of native coronary artery without angina pectoris: Secondary | ICD-10-CM | POA: Insufficient documentation

## 2019-07-10 DIAGNOSIS — Z87891 Personal history of nicotine dependence: Secondary | ICD-10-CM | POA: Insufficient documentation

## 2019-07-10 DIAGNOSIS — E785 Hyperlipidemia, unspecified: Secondary | ICD-10-CM | POA: Diagnosis not present

## 2019-07-10 HISTORY — PX: ESOPHAGOGASTRODUODENOSCOPY (EGD) WITH PROPOFOL: SHX5813

## 2019-07-10 HISTORY — PX: BIOPSY: SHX5522

## 2019-07-10 LAB — GLUCOSE, CAPILLARY
Glucose-Capillary: 122 mg/dL — ABNORMAL HIGH (ref 70–99)
Glucose-Capillary: 129 mg/dL — ABNORMAL HIGH (ref 70–99)

## 2019-07-10 SURGERY — ESOPHAGOGASTRODUODENOSCOPY (EGD) WITH PROPOFOL
Anesthesia: Monitor Anesthesia Care

## 2019-07-10 MED ORDER — SODIUM CHLORIDE 0.9 % IV SOLN: INTRAVENOUS | Status: DC

## 2019-07-10 MED ORDER — PROPOFOL 10 MG/ML IV BOLUS
INTRAVENOUS | Status: AC
  Filled 2019-07-10: qty 20

## 2019-07-10 MED ORDER — PROPOFOL 500 MG/50ML IV EMUL
INTRAVENOUS | Status: DC | PRN
  Administered 2019-07-10: 125 ug/kg/min via INTRAVENOUS

## 2019-07-10 MED ORDER — PROPOFOL 10 MG/ML IV BOLUS
INTRAVENOUS | Status: DC | PRN
  Administered 2019-07-10: 10 mg via INTRAVENOUS

## 2019-07-10 MED ORDER — LACTATED RINGERS IV SOLN
INTRAVENOUS | Status: DC | PRN
Start: 2019-07-10 — End: 2019-07-10

## 2019-07-10 MED ORDER — PROPOFOL 500 MG/50ML IV EMUL
INTRAVENOUS | Status: AC
  Filled 2019-07-10: qty 50

## 2019-07-10 SURGICAL SUPPLY — 15 items

## 2019-07-10 NOTE — Op Note (Signed)
[facility] Patient Name: Wesley Winters Procedure Date: 07/10/2019 MRN: [id number] Attending MD: Otis Brace , MD Date of Birth: 1940-07-28 CSN: [id number] Age: 79 Admit Type: Outpatient Procedure:                Upper GI endoscopy Indications:              Esophageal reflux symptoms that persist despite                            appropriate therapy Providers:                Otis Brace, MD, Cleda Daub, RN, Elspeth Cho Tech., Technician Referring MD:              Medicines:                Sedation Administered by an Anesthesia Professional Complications:            No immediate complications. Estimated Blood Loss:     Estimated blood loss was minimal. Procedure:                Pre-Anesthesia Assessment:                           - Prior to the procedure, a History and Physical                            was performed, and patient medications and                            allergies were reviewed. The patient's tolerance of                            previous anesthesia was also reviewed. The risks                            and benefits of the procedure and the sedation                            options and risks were discussed with the patient.                            All questions were answered, and informed consent                            was obtained. Prior Anticoagulants: The patient has                            taken no previous anticoagulant or antiplatelet                            agents except for aspirin. ASA Grade Assessment:  III - A patient with severe systemic disease. After                            reviewing the risks and benefits, the patient was                            deemed in satisfactory condition to undergo the                            procedure.                           After obtaining informed consent, the endoscope was                            passed under  direct vision. Throughout the                            procedure, the patient's blood pressure, pulse, and                            oxygen saturations were monitored continuously. The                            GIF-H190 ([id number]) was introduced through the                            mouth, and advanced to the second part of duodenum.                            The upper GI endoscopy was accomplished without                            difficulty. The patient tolerated the procedure                            well. Scope In: Scope Out: Findings:      The Z-line was regular and was found 40 cm from the incisors.      A 2 cm hiatal hernia was present.      Normal mucosa was found in the entire esophagus. Biopsies were obtained       from the proximal and distal esophagus with cold forceps for histology       of suspected eosinophilic esophagitis.      Scattered mild inflammation characterized by erythema was found in the       gastric antrum. Biopsies were taken with a cold forceps for histology.      The cardia and gastric fundus were normal on retroflexion.      The duodenal bulb, first portion of the duodenum and second portion of       the duodenum were normal. Impression:               - Z-line regular, 40 cm from the incisors.                           -  2 cm hiatal hernia.                           - Normal mucosa was found in the entire esophagus.                            Biopsied.                           - Gastritis. Biopsied.                           - Normal duodenal bulb, first portion of the                            duodenum and second portion of the duodenum. Moderate Sedation:      Moderate (conscious) sedation was personally administered by an       anesthesia professional. The following parameters were monitored: oxygen       saturation, heart rate, blood pressure, and response to care. Recommendation:           - Patient has a contact number available for                             emergencies. The signs and symptoms of potential                            delayed complications were discussed with the                            patient. Return to normal activities tomorrow.                            Written discharge instructions were provided to the                            patient.                           - Resume previous diet.                           - Continue present medications.                           - Await pathology results.                           - Return to my office as previously scheduled. Procedure Code(s):        --- Professional ---                           [phone], Esophagogastroduodenoscopy, flexible,                            transoral; with biopsy, single or multiple Diagnosis Code(s):        --- Professional ---  K22.2, Esophageal obstruction                           K44.9, Diaphragmatic hernia without obstruction or                            gangrene                           K29.70, Gastritis, unspecified, without bleeding                           K21.9, Gastro-esophageal reflux disease without                            esophagitis CPT copyright 2019 [employer]. All rights reserved. The codes documented in this report are preliminary and upon coder review may  be revised to meet current compliance requirements. Otis Brace, MD Otis Brace, MD 07/10/2019 10:14:52 AM Number of Addenda: 0

## 2019-07-10 NOTE — Anesthesia Preprocedure Evaluation (Signed)
Anesthesia Evaluation  Patient identified by MRN, date of birth, ID band Patient awake    Reviewed: Allergy & Precautions, NPO status , Patient's Chart, lab work & pertinent test results  Airway Mallampati: II  TM Distance: >3 FB Neck ROM: Full    Dental no notable dental hx.    Pulmonary sleep apnea and Continuous Positive Airway Pressure Ventilation ,    Pulmonary exam normal breath sounds clear to auscultation       Cardiovascular hypertension, + CAD and + Cardiac Stents  Normal cardiovascular exam Rhythm:Regular Rate:Normal     Neuro/Psych negative neurological ROS  negative psych ROS   GI/Hepatic negative GI ROS, Neg liver ROS,   Endo/Other  negative endocrine ROSdiabetes  Renal/GU negative Renal ROS  negative genitourinary   Musculoskeletal negative musculoskeletal ROS (+)   Abdominal   Peds negative pediatric ROS (+)  Hematology negative hematology ROS (+)   Anesthesia Other Findings   Reproductive/Obstetrics negative OB ROS                             Anesthesia Physical Anesthesia Plan  ASA: III  Anesthesia Plan: MAC   Post-op Pain Management:    Induction: Intravenous  PONV Risk Score and Plan: 0  Airway Management Planned: Simple Face Mask  Additional Equipment:   Intra-op Plan:   Post-operative Plan:   Informed Consent: I have reviewed the patients History and Physical, chart, labs and discussed the procedure including the risks, benefits and alternatives for the proposed anesthesia with the patient or authorized representative who has indicated his/her understanding and acceptance.     Dental advisory given  Plan Discussed with: CRNA and Surgeon  Anesthesia Plan Comments:         Anesthesia Quick Evaluation

## 2019-07-10 NOTE — H&P (Signed)
Wesley Winters is a 79 y.o. male was seen in the office for evaluation of worsening acid reflux.  Please see scanned office note for details. The various methods of treatment have been discussed with the patient and family. After consideration of risks, benefits and other options for treatment, the patient has consented to  Procedure(s): Esophagoduodenoscopy as a surgical intervention .  The patient's history has been reviewed, patient examined, no change in status, stable for surgery.  I have reviewed the patient's chart and labs.  Questions were answered to the patient's satisfaction.   Risks (bleeding, infection, bowel perforation that could require surgery, sedation-related changes in cardiopulmonary systems), benefits (identification and possible treatment of source of symptoms, exclusion of certain causes of symptoms), and alternatives (watchful waiting, radiographic imaging studies, empiric medical treatment)  were explained to patient in detail and patient wishes to proceed.   Otis Brace MD, [place] 07/10/2019, 9:46 AM  Contact #  [phone]

## 2019-07-10 NOTE — Discharge Instructions (Signed)

## 2019-07-10 NOTE — Progress Notes (Signed)
Cardiology Office Note:    Date:  07/11/2019   ID:  Wesley Winters, DOB [birth year], MRN [id number]  PCP:  [name], MD  Cardiologist:  Sinclair Grooms, MD   Referring MD: [name], MD   Chief Complaint  Patient presents with  . Coronary Artery Disease  . Chest Pain    History of Present Illness:    Wesley Winters is a 79 y.o. male with a hx of CAD with stent to LAD and RCA proximal and mid stents in 1998 and 1999 then cardiac cath in 2005 with patent coronary arteries. JA25 to 50% 2017.  Since March, he has experienced burning epigastric and retrosternal discomfort.  This is also been associated with dysphagia and burning with swallowing.  He is on sucralfate and Protonix without improvement.  He had GI endoscopy performed yesterday that revealed a 2 cm hiatal hernia but was otherwise unremarkable.  He was told to see cardiology to get an opinion concerning.  He does not have exertional discomfort or dyspnea.  He moves slowly.  The burning discomfort in the chest can last all day.  It goes away when he lies down usually.  It returns at around 4 AM and is present throughout the entirety of the day.  The discomfort is not made worse by activity.  There is no associated dyspnea.  Eating tends to aggravate his symptoms in causing burning and dysphagia.  He did have RCA and LAD stents placed greater than 20 years ago (1998).    Past Medical History:  Diagnosis Date  . Balanitis   . Bladder calculus   . Coronary artery disease    stents x3 by Dr Tamala Julian 1998  . Depression   . Diabetes mellitus without complication ([place])   . GERD (gastroesophageal reflux disease)   . Hypercholesterolemia   . Hypertension   . Phimosis   . Prostate cancer ([place])   . Sleep apnea    wears CPAP nightly    Past Surgical History:  Procedure Laterality Date  . CIRCUMCISION    . CYSTOSCOPY    . HERNIA REPAIR     umbilical  . PROSTATE BIOPSY    . PROSTATE BIOPSY    . PROSTATE BIOPSY    .  three cardiac stents      Current Medications: Current Meds  Medication Sig  . acetaminophen (TYLENOL) 500 MG tablet Take 500-1,000 mg by mouth 3 (three) times daily as needed for moderate pain.  Marland Kitchen amLODipine (NORVASC) 10 MG tablet Take 10 mg by mouth daily.   Marland Kitchen aspirin 81 MG tablet Take 81 mg by mouth daily.  Marland Kitchen atorvastatin (LIPITOR) 40 MG tablet Take 40 mg by mouth daily.  Marland Kitchen azelastine (ASTELIN) 0.1 % nasal spray Place 2 sprays into both nostrils 2 (two) times daily as needed for allergies. Use in each nostril as directed   . benazepril (LOTENSIN) 20 MG tablet Take 20 mg by mouth daily.  . betamethasone dipropionate 0.05 % cream Apply 1 application topically 2 (two) times daily.  . cholecalciferol (VITAMIN D3) 25 MCG (1000 UT) tablet Take 1,000 Units by mouth every evening.   . fexofenadine (ALLEGRA) 180 MG tablet Take 180 mg by mouth daily as needed for allergies.   . fluticasone (FLONASE) 50 MCG/ACT nasal spray Place 2 sprays into both nostrils daily as needed for allergies.   . hydrochlorothiazide (HYDRODIURIL) 25 MG tablet Take 25 mg by mouth daily.  . Insulin Glargine (TOUJEO MAX SOLOSTAR) 300 UNIT/ML  SOPN Inject 25 Units into the skin every evening.   Marland Kitchen JANUVIA 100 MG tablet Take 100 mg by mouth daily.   . metFORMIN (GLUCOPHAGE-XR) 500 MG 24 hr tablet Take 1,000 mg by mouth 2 (two) times daily.   . mirabegron ER (MYRBETRIQ) 50 MG TB24 tablet Take 50 mg by mouth daily.  . nebivolol (BYSTOLIC) 10 MG tablet Take 5 mg by mouth daily.   . pantoprazole (PROTONIX) 40 MG tablet Take 40 mg by mouth 2 (two) times daily.  . polyethylene glycol powder (MIRALAX) powder Take 17 g by mouth every evening.   . potassium chloride (K-DUR,KLOR-CON) 10 MEQ tablet Take 10 mEq by mouth 2 (two) times daily.   . sucralfate (CARAFATE) 1 g tablet Take 1 g by mouth in the morning and at bedtime.  . triamcinolone cream (KENALOG) 0.1 % Apply 1 application topically 2 (two) times daily as needed (rash).   .  vitamin B-12 (CYANOCOBALAMIN) 1000 MCG tablet Take 1,000 mcg by mouth daily.      Allergies:   Amoxicillin   Social History   Socioeconomic History  . Marital status: Married    Spouse name: Not on file  . Number of children: Not on file  . Years of education: Not on file  . Highest education level: Not on file  Occupational History  . Not on file  Tobacco Use  . Smoking status: Never Smoker  . Smokeless tobacco: Never Used  Substance and Sexual Activity  . Alcohol use: No  . Drug use: No  . Sexual activity: Yes  Other Topics Concern  . Not on file  Social History Narrative  . Not on file   Social Determinants of Health   Financial Resource Strain:   . Difficulty of Paying Living Expenses:   Food Insecurity:   . Worried About Charity fundraiser in the Last Year:   . Arboriculturist in the Last Year:   Transportation Needs:   . Film/video editor (Medical):   Marland Kitchen Lack of Transportation (Non-Medical):   Physical Activity:   . Days of Exercise per Week:   . Minutes of Exercise per Session:   Stress:   . Feeling of Stress :   Social Connections:   . Frequency of Communication with Friends and Family:   . Frequency of Social Gatherings with Friends and Family:   . Attends Religious Services:   . Active Member of Clubs or Organizations:   . Attends Archivist Meetings:   Marland Kitchen Marital Status:      Family History: The patient's family history includes Arrhythmia in his brother and mother; Cancer in his brother; Heart disease in his brother and mother.  ROS:   Please see the history of present illness.    He denies orthopnea.  No lower extremity swelling.  He does awaken around 4 AM with retrosternal burning.  Difficulty swallowing both solids and liquids.  All other systems reviewed and are negative.  EKGs/Labs/Other Studies Reviewed:    The following studies were reviewed today:  Upper GI Endoscopy 07/2019:  CONCLUSION - Z-line regular, 40 cm from  the incisors. - 2 cm hiatal hernia. - Normal mucosa was found in the entire esophagus. Biopsied. - Gastritis. Biopsied. - Normal duodenal bulb,  Coronary angiography July 2005: CONCLUSIONS:  1. False positive stress Cardiolite study demonstrating an inferior wall     abnormality that does not represent a perfusion defect.  2. Essentially widely-patent coronaries with patent left anterior  descending     coronary artery and right coronary artery stents.     stents.  No significant obstructive lesions are noted throughout the     coronary arterial tree.  3. Normal left ventricular function.  Echocardiogram 2020 IMPRESSIONS    1. The left ventricle has mildly reduced systolic function, with an  ejection fraction of 45-50%. The cavity size was moderately dilated. There  is mild concentric left ventricular hypertrophy. Left ventricular  diastolic Doppler parameters are consistent  with impaired relaxation. Indeterminate filling pressures.  2. Mild hypokinesis of the inferolateral myocardium.  3. The right ventricle has normal systolic function. The cavity was  normal. There is no increase in right ventricular wall thickness.   [facility] 2020: Study Highlights   Nuclear stress EF: 25%.  There was no ST segment deviation noted during stress.  Defect 1: There is a small defect of moderate severity present in the apical inferior location.  Findings consistent with prior myocardial infarction.  The left ventricular ejection fraction is severely decreased (<30%).  This is a high risk study due to reduced systolic function.  No ischemia.  Global hypokinesis worse in the inferior and inferolateral myocardium.  2D Doppler echocardiogram March 2020: IMPRESSIONS    1. The left ventricle has mildly reduced systolic function, with an  ejection fraction of 45-50%. The cavity size was moderately dilated. There  is mild concentric left ventricular hypertrophy. Left  ventricular  diastolic Doppler parameters are consistent  with impaired relaxation. Indeterminate filling pressures.  2. Mild hypokinesis of the inferolateral myocardium.  3. The right ventricle has normal systolic function. The cavity was  normal. There is no increase in right ventricular wall thickness.   EKG:  EKG sinus rhythm, nonspecific interventricular conduction delay with poor R wave progression V1 through V3.  PVC.  No change when compared to 03/29/2019  Recent Labs: No results found for requested labs within last 8760 hours.  Recent Lipid Panel No results found for: CHOL, TRIG, HDL, CHOLHDL, VLDL, LDLCALC, LDLDIRECT  Physical Exam:    VS:  BP (!) 142/74   Pulse 65   Ht 5\' 6"  (1.676 m)   Wt 216 lb (98 kg)   SpO2 97%   BMI 34.86 kg/m     Wt Readings from Last 3 Encounters:  07/11/19 216 lb (98 kg)  07/05/19 240 lb (108.9 kg)  03/29/19 225 lb 1.9 oz (102.1 kg)     GEN: Obese.. No acute distress HEENT: Normal NECK: No JVD. LYMPHATICS: No lymphadenopathy CARDIAC:  RRR without murmur, gallop, or edema. VASCULAR:  Normal Pulses. No bruits. RESPIRATORY:  Clear to auscultation without rales, wheezing or rhonchi  ABDOMEN: Soft, non-tender, non-distended, No pulsatile mass, MUSCULOSKELETAL: No deformity  SKIN: Warm and dry NEUROLOGIC:  Alert and oriented x 3 PSYCHIATRIC:  Normal affect   ASSESSMENT:    1. CAD in native artery   2. Hyperlipidemia LDL goal <70   3. Type 2 diabetes mellitus with other circulatory complication, without long-term current use of insulin ([place])   4. Essential hypertension   5. Educated about COVID-19 virus infection   6. Chest pain of uncertain etiology   7. SOB (shortness of breath)    PLAN:    In order of problems listed above:  1. Symptoms atypical for ischemia especially given stable EKG over 3 months despite near continuous discomfort.  Discomfort is not made worse by physical activity.  We will get a brain natruretic peptide  because he does have  some exertional dyspnea.  We will also get a at high-sensitivity troponin.  If either are significantly elevated, will likely need to have diagnostic coronary angiography to exclude the possibility of atypical angina. 2. LDL was 54 July 2020 3. Hemoglobin A1c was 7.1 in March 2021 continue insulin, and Glucophage. 4. Blood pressure is borderline controlled today.  He should continue Lotensin, Norvasc, HydroDIURIL, and Bystolic.  Target is 140/80 mmHg now that he is 79 years of age. 5. Vaccination has been received.  Social distancing recommended. 6. Sounds more GI than ischemic.  Recent upper endoscopy did not not explain the patient's current complaints therefore we embarking upon an ischemic evaluation in absence of firm GI explanation. 7. BMP will help differentiate whether there is underlying heart failure.  EF was in the 45 to 50% range by imaging 1 year ago.   Medication Adjustments/Labs and Tests Ordered: Current medicines are reviewed at length with the patient today.  Concerns regarding medicines are outlined above.  Orders Placed This Encounter  Procedures  . Pro b natriuretic peptide  . EKG 12-Lead   No orders of the defined types were placed in this encounter.   Patient Instructions  Medication Instructions:  Your physician recommends that you continue on your current medications as directed. Please refer to the Current Medication list given to you today.  *If you need a refill on your cardiac medications before your next appointment, please call your pharmacy*   Lab Work: HS Troponin and Pro BNP  If you have labs (blood work) drawn today and your tests are completely normal, you will receive your results only by: Marland Kitchen MyChart Message (if you have MyChart) OR . A paper copy in the mail If you have any lab test that is abnormal or we need to change your treatment, we will call you to review the results.   Testing/Procedures: None   Follow-Up: At  [facility], you and your health needs are our priority.  As part of our continuing mission to provide you with exceptional heart care, we have created designated Provider Care Teams.  These Care Teams include your primary Cardiologist (physician) and Advanced Practice Providers (APPs -  Physician Assistants and Nurse Practitioners) who all work together to provide you with the care you need, when you need it.  We recommend signing up for the patient portal called "MyChart".  Sign up information is provided on this After Visit Summary.  MyChart is used to connect with patients for Virtual Visits (Telemedicine).  Patients are able to view lab/test results, encounter notes, upcoming appointments, etc.  Non-urgent messages can be sent to your provider as well.   To learn more about what you can do with MyChart, go to [email or web address].    Your next appointment:   8 month(s)  The format for your next appointment:   In Person  Provider:   You may see Sinclair Grooms, MD or one of the following Advanced Practice Providers on your designated Care Team:    Truitt Merle, NP  Cecilie Kicks, NP  Kathyrn Drown, NP    Other Instructions      Signed, Sinclair Grooms, MD  07/11/2019 11:01 AM    [place]

## 2019-07-10 NOTE — Anesthesia Postprocedure Evaluation (Signed)
Anesthesia Post Note  Patient: Wesley Winters  Procedure(s) Performed: ESOPHAGOGASTRODUODENOSCOPY (EGD) WITH PROPOFOL (N/A ) BIOPSY     Patient location during evaluation: PACU Anesthesia Type: MAC Level of consciousness: awake and alert Pain management: pain level controlled Vital Signs Assessment: post-procedure vital signs reviewed and stable Respiratory status: spontaneous breathing, nonlabored ventilation, respiratory function stable and patient connected to nasal cannula oxygen Cardiovascular status: stable and blood pressure returned to baseline Postop Assessment: no apparent nausea or vomiting Anesthetic complications: no   No complications documented.  Last Vitals:  Vitals:   07/10/19 1026 07/10/19 1045  BP: 131/65   Pulse: (!) 52   Resp: 20   Temp:    SpO2: 96% 96%    Last Pain:  Vitals:   07/10/19 1026  TempSrc:   PainSc: 4                  Abdi Husak S

## 2019-07-10 NOTE — Anesthesia Procedure Notes (Signed)
Procedure Name: MAC Date/Time: 07/10/2019 9:55 AM Performed by: Eben Burow, CRNA Pre-anesthesia Checklist: Patient identified, Emergency Drugs available, Suction available, Patient being monitored and Timeout performed Oxygen Delivery Method: Simple face mask

## 2019-07-10 NOTE — Transfer of Care (Signed)
Immediate Anesthesia Transfer of Care Note  Patient: Wesley Winters  Procedure(s) Performed: ESOPHAGOGASTRODUODENOSCOPY (EGD) WITH PROPOFOL (N/A ) BIOPSY  Patient Location: PACU and Endoscopy Unit  Anesthesia Type:MAC  Level of Consciousness: awake, alert  and oriented  Airway & Oxygen Therapy: Patient Spontanous Breathing and Patient connected to face mask oxygen  Post-op Assessment: Report given to RN and Post -op Vital signs reviewed and stable  Post vital signs: Reviewed and stable  Last Vitals:  Vitals Value Taken Time  BP    Temp    Pulse    Resp    SpO2      Last Pain:  Vitals:   07/10/19 0911  TempSrc: Oral  PainSc: 0-No pain         Complications: No complications documented.

## 2019-07-11 ENCOUNTER — Encounter: Payer: Self-pay | Admitting: Interventional Cardiology

## 2019-07-11 ENCOUNTER — Other Ambulatory Visit: Payer: Self-pay

## 2019-07-11 ENCOUNTER — Ambulatory Visit: Payer: Medicare Other | Admitting: Interventional Cardiology

## 2019-07-11 VITALS — BP 142/74 | HR 65 | Ht 66.0 in | Wt 216.0 lb

## 2019-07-11 DIAGNOSIS — E1159 Type 2 diabetes mellitus with other circulatory complications: Secondary | ICD-10-CM | POA: Diagnosis not present

## 2019-07-11 DIAGNOSIS — Z7189 Other specified counseling: Secondary | ICD-10-CM

## 2019-07-11 DIAGNOSIS — E785 Hyperlipidemia, unspecified: Secondary | ICD-10-CM

## 2019-07-11 DIAGNOSIS — I10 Essential (primary) hypertension: Secondary | ICD-10-CM

## 2019-07-11 DIAGNOSIS — I2510 Atherosclerotic heart disease of native coronary artery without angina pectoris: Secondary | ICD-10-CM

## 2019-07-11 DIAGNOSIS — R079 Chest pain, unspecified: Secondary | ICD-10-CM | POA: Diagnosis not present

## 2019-07-11 DIAGNOSIS — R0602 Shortness of breath: Secondary | ICD-10-CM | POA: Diagnosis not present

## 2019-07-11 LAB — SURGICAL PATHOLOGY

## 2019-07-11 LAB — CBC
Hematocrit: 40.7 % (ref 37.5–51.0)
Hemoglobin: 13.4 g/dL (ref 13.0–17.7)
MCH: 27.7 pg (ref 26.6–33.0)
MCHC: 32.9 g/dL (ref 31.5–35.7)
MCV: 84 fL (ref 79–97)
Platelets: 228 10*3/uL (ref 150–450)
RBC: 4.84 x10E6/uL (ref 4.14–5.80)
RDW: 14.4 % (ref 11.6–15.4)
WBC: 7.4 10*3/uL (ref 3.4–10.8)

## 2019-07-11 LAB — BASIC METABOLIC PANEL
BUN/Creatinine Ratio: 15 (ref 10–24)
BUN: 17 mg/dL (ref 8–27)
CO2: 24 mmol/L (ref 20–29)
Calcium: 9.5 mg/dL (ref 8.6–10.2)
Chloride: 96 mmol/L (ref 96–106)
Creatinine, Ser: 1.16 mg/dL (ref 0.76–1.27)
GFR calc Af Amer: 68 mL/min/{1.73_m2} (ref >59)
GFR calc non Af Amer: 59 mL/min/{1.73_m2} — ABNORMAL LOW (ref >59)
Glucose: 149 mg/dL — ABNORMAL HIGH (ref 65–99)
Potassium: 4.4 mmol/L (ref 3.5–5.2)
Sodium: 139 mmol/L (ref 134–144)

## 2019-07-11 LAB — PRO B NATRIURETIC PEPTIDE: NT-Pro BNP: 445 pg/mL (ref 0–486)

## 2019-07-11 LAB — TROPONIN I (HIGH SENSITIVITY): Troponin I (High Sensitivity): 15 ng/L (ref <18)

## 2019-07-11 NOTE — Patient Instructions (Addendum)
Medication Instructions:  Your physician recommends that you continue on your current medications as directed. Please refer to the Current Medication list given to you today.  *If you need a refill on your cardiac medications before your next appointment, please call your pharmacy*   Lab Work: HS Troponin, BMET, CBC and Pro BNP  If you have labs (blood work) drawn today and your tests are completely normal, you will receive your results only by: Marland Kitchen MyChart Message (if you have MyChart) OR . A paper copy in the mail If you have any lab test that is abnormal or we need to change your treatment, we will call you to review the results.   Testing/Procedures: None   Follow-Up: At [facility], you and your health needs are our priority.  As part of our continuing mission to provide you with exceptional heart care, we have created designated Provider Care Teams.  These Care Teams include your primary Cardiologist (physician) and Advanced Practice Providers (APPs -  Physician Assistants and Nurse Practitioners) who all work together to provide you with the care you need, when you need it.  We recommend signing up for the patient portal called "MyChart".  Sign up information is provided on this After Visit Summary.  MyChart is used to connect with patients for Virtual Visits (Telemedicine).  Patients are able to view lab/test results, encounter notes, upcoming appointments, etc.  Non-urgent messages can be sent to your provider as well.   To learn more about what you can do with MyChart, go to [email or web address].    Your next appointment:   8 month(s)  The format for your next appointment:   In Person  Provider:   You may see Sinclair Grooms, MD or one of the following Advanced Practice Providers on your designated Care Team:    Truitt Merle, NP  Cecilie Kicks, NP  Kathyrn Drown, NP    Other Instructions

## 2019-07-13 ENCOUNTER — Encounter (HOSPITAL_COMMUNITY): Payer: Self-pay | Admitting: Gastroenterology

## 2019-07-17 ENCOUNTER — Other Ambulatory Visit: Payer: Self-pay

## 2019-07-17 ENCOUNTER — Ambulatory Visit: Payer: Medicare Other | Admitting: Podiatry

## 2019-07-17 ENCOUNTER — Encounter: Payer: Self-pay | Admitting: Podiatry

## 2019-07-17 DIAGNOSIS — M79675 Pain in left toe(s): Secondary | ICD-10-CM | POA: Diagnosis not present

## 2019-07-17 DIAGNOSIS — B351 Tinea unguium: Secondary | ICD-10-CM | POA: Diagnosis not present

## 2019-07-17 DIAGNOSIS — M79674 Pain in right toe(s): Secondary | ICD-10-CM | POA: Diagnosis not present

## 2019-07-17 DIAGNOSIS — L03031 Cellulitis of right toe: Secondary | ICD-10-CM

## 2019-07-17 DIAGNOSIS — L600 Ingrowing nail: Secondary | ICD-10-CM

## 2019-07-17 DIAGNOSIS — E1142 Type 2 diabetes mellitus with diabetic polyneuropathy: Secondary | ICD-10-CM

## 2019-07-17 NOTE — Patient Instructions (Addendum)
EPSOM SALT FOOT SOAK INSTRUCTIONS (Do not remove band-aid until after bath or shower. Perform soak after your bath/shower) *IF YOU HAVE BEEN PRESCRIBED ANTIBIOTICS, TAKE AS INSTRUCTED UNTIL ALL ARE GONE*  Shopping List:  A. Plain epsom salt (not scented) B. Neosporin Cream/Ointment or Bacitracin Cream/Ointment (or prescribed antiobiotic drops/cream/ointment) C. 1-inch fabric band-aids  1.  Place 1/4 cup of epsom salts in 2 quarts of warm tap water. IF YOU ARE DIABETIC, OR HAVE NEUROPATHY, CHECK THE TEMPERATURE OF THE WATER WITH YOUR ELBOW.  2.  Submerge your foot/feet in the solution and soak for 10-15 minutes.      3.  Next, remove your foot/feet from solution, blot dry the affected area.    4.  Apply light amount of Mupirocin ointment and cover with fabric band-aid .  5.  This soak should be done once a day for 14 days.   6.  Monitor for any signs/symptoms of infection such as redness, swelling, odor, drainage, increased pain, or non-healing of digit. Call office immediately.  7.  Please do not hesitate to call the office and speak to a Nurse or Doctor if you have questions.   8.  If you experience fever, chills, nightsweats, nausea or vomiting with worsening of digit, please go to the emergency room.    Ingrown Toenail An ingrown toenail occurs when the corner or sides of a toenail grow into the surrounding skin. This causes discomfort and pain. The big toe is most commonly affected, but any of the toes can be affected. If an ingrown toenail is not treated, it can become infected. What are the causes? This condition may be caused by:  Wearing shoes that are too small or tight.  An injury, such as stubbing your toe or having your toe stepped on.  Improper cutting or care of your toenails.  Having nail or foot abnormalities that were present from birth (congenital abnormalities), such as having a nail that is too big for your toe. What increases the risk? The following factors  may make you more likely to develop ingrown toenails:  Age. Nails tend to get thicker with age, so ingrown nails are more common among older people.  Cutting your toenails incorrectly, such as cutting them very short or cutting them unevenly. An ingrown toenail is more likely to get infected if you have:  Diabetes.  Blood flow (circulation) problems. What are the signs or symptoms? Symptoms of an ingrown toenail may include:  Pain, soreness, or tenderness.  Redness.  Swelling.  Hardening of the skin that surrounds the toenail. Signs that an ingrown toenail may be infected include:  Fluid or pus.  Symptoms that get worse instead of better. How is this diagnosed? An ingrown toenail may be diagnosed based on your medical history, your symptoms, and a physical exam. If you have fluid or blood coming from your toenail, a sample may be collected to test for the specific type of bacteria that is causing the infection. How is this treated? Treatment depends on how severe your ingrown toenail is. You may be able to care for your toenail at home.  If you have an infection, you may be prescribed antibiotic medicines.  If you have fluid or pus draining from your toenail, your health care provider may drain it.  If you have trouble walking, you may be given crutches to use.  If you have a severe or infected ingrown toenail, you may need a procedure to remove part or all of the nail.  Follow these instructions at home: Foot care   Do not pick at your toenail or try to remove it yourself.  Soak your foot in warm, soapy water. Do this for 20 minutes, 3 times a day, or as often as told by your health care provider. This helps to keep your toe clean and keep your skin soft.  Wear shoes that fit well and are not too tight. Your health care provider may recommend that you wear open-toed shoes while you heal.  Trim your toenails regularly and carefully. Cut your toenails straight across to  prevent injury to the skin at the corners of the toenail. Do not cut your nails in a curved shape.  Keep your feet clean and dry to help prevent infection. Medicines  Take over-the-counter and prescription medicines only as told by your health care provider.  If you were prescribed an antibiotic, take it as told by your health care provider. Do not stop taking the antibiotic even if you start to feel better. Activity  Return to your normal activities as told by your health care provider. Ask your health care provider what activities are safe for you.  Avoid activities that cause pain. General instructions  If your health care provider told you to use crutches to help you move around, use them as instructed.  Keep all follow-up visits as told by your health care provider. This is important. Contact a health care provider if:  You have more redness, swelling, pain, or other symptoms that do not improve with treatment.  You have fluid, blood, or pus coming from your toenail. Get help right away if:  You have a red streak on your skin that starts at your foot and spreads up your leg.  You have a fever. Summary  An ingrown toenail occurs when the corner or sides of a toenail grow into the surrounding skin. This causes discomfort and pain. The big toe is most commonly affected, but any of the toes can be affected.  If an ingrown toenail is not treated, it can become infected.  Fluid or pus draining from your toenail is a sign of infection. Your health care provider may need to drain it. You may be given antibiotics to treat the infection.  Trimming your toenails regularly and properly can help you prevent an ingrown toenail. This information is not intended to replace advice given to you by your health care provider. Make sure you discuss any questions you have with your health care provider. Document Revised: 04/14/2018 Document Reviewed: 09/08/2016 Elsevier Patient Education  [place].

## 2019-07-17 NOTE — Progress Notes (Signed)
Subjective: Wesley Winters presents today preventative diabetic foot care and painful thick toenails that are difficult to trim. Pain interferes with ambulation. Aggravating factors include wearing enclosed shoe gear. Pain is relieved with periodic professional debridement. Patient states right great toenail has grown out quite a bit asking if I put "Miracle Gro" on his toe.    He denies any pain to digit. Denies any fever, chills, nightsweats, nausea or vomiting.  [name], MD is patient's PCP.   Past Medical History:  Diagnosis Date  . Balanitis   . Bladder calculus   . Coronary artery disease    stents x3 by Dr Tamala Julian 1998  . Depression   . Diabetes mellitus without complication ([place])   . GERD (gastroesophageal reflux disease)   . Hypercholesterolemia   . Hypertension   . Phimosis   . Prostate cancer ([place])   . Sleep apnea    wears CPAP nightly     Patient Active Problem List   Diagnosis Date Noted  . CAD in native artery 02/22/2016  . Hyperlipidemia LDL goal <70 02/22/2016  . Malignant neoplasm of prostate ([place]) 11/04/2014  . Diabetes ([place]) 11/04/2014    Current Outpatient Medications on File Prior to Visit  Medication Sig Dispense Refill  . acetaminophen (TYLENOL) 500 MG tablet Take 500-1,000 mg by mouth 3 (three) times daily as needed for moderate pain.    Marland Kitchen amLODipine (NORVASC) 10 MG tablet Take 10 mg by mouth daily.   2  . aspirin 81 MG tablet Take 81 mg by mouth daily.    Marland Kitchen atorvastatin (LIPITOR) 40 MG tablet Take 40 mg by mouth daily.    Marland Kitchen azelastine (ASTELIN) 0.1 % nasal spray Place 2 sprays into both nostrils 2 (two) times daily as needed for allergies. Use in each nostril as directed     . benazepril (LOTENSIN) 20 MG tablet Take 20 mg by mouth daily.  3  . betamethasone dipropionate 0.05 % cream Apply 1 application topically 2 (two) times daily.    . cholecalciferol (VITAMIN D3) 25 MCG (1000 UT) tablet Take 1,000 Units by mouth every evening.     . fexofenadine  (ALLEGRA) 180 MG tablet Take 180 mg by mouth daily as needed for allergies.     . fluticasone (FLONASE) 50 MCG/ACT nasal spray Place 2 sprays into both nostrils daily as needed for allergies.     . hydrochlorothiazide (HYDRODIURIL) 25 MG tablet Take 25 mg by mouth daily.    . Insulin Glargine (TOUJEO MAX SOLOSTAR) 300 UNIT/ML SOPN Inject 25 Units into the skin every evening.     Marland Kitchen JANUVIA 100 MG tablet Take 100 mg by mouth daily.     . metFORMIN (GLUCOPHAGE-XR) 500 MG 24 hr tablet Take 1,000 mg by mouth 2 (two) times daily.   2  . mirabegron ER (MYRBETRIQ) 50 MG TB24 tablet Take 50 mg by mouth daily.    . nebivolol (BYSTOLIC) 10 MG tablet Take 5 mg by mouth daily.     . pantoprazole (PROTONIX) 40 MG tablet Take 40 mg by mouth 2 (two) times daily.    . polyethylene glycol powder (MIRALAX) powder Take 17 g by mouth every evening.     . potassium chloride (K-DUR,KLOR-CON) 10 MEQ tablet Take 10 mEq by mouth 2 (two) times daily.     . sucralfate (CARAFATE) 1 g tablet Take 1 g by mouth in the morning and at bedtime.    . triamcinolone cream (KENALOG) 0.1 % Apply 1 application topically  2 (two) times daily as needed (rash).     . vitamin B-12 (CYANOCOBALAMIN) 1000 MCG tablet Take 1,000 mcg by mouth daily.      No current facility-administered medications on file prior to visit.     Allergies  Allergen Reactions  . Amoxicillin Hives and Nausea Only    Objective: Wesley Winters is a pleasant 79 y.o. Caucasian male obese in NAD. AAO x 3.  There were no vitals filed for this visit.  Vascular Examination: Neurovascular status unchanged b/l lower extremities. Capillary refill time to digits immediate b/l. Palpable DP pulse(s) left lower extremity Faintly palpable DP pulse(s) right lower extremity. Faintly palpable PT pulse(s) b/l lower extremities. Pedal hair present. Lower extremity skin temperature gradient within normal limits. Trace edema noted b/l lower extremities.  Dermatological  Examination: Pedal skin with normal turgor, texture and tone bilaterally. No open wounds bilaterally. No interdigital macerations bilaterally. Toenails 1-5 b/l elongated, discolored, dystrophic, thickened, crumbly with subungual debris and tenderness to dorsal palpation. Incurvated nailplate lateral border(s) R hallux.  Nail border hypertrophy present. There is tenderness to palpation. Sign(s) of infection: no clinical signs of infection noted on examination today..  No purulence, no erythema, no edema.There is granulation tissue present.   Musculoskeletal: Normal muscle strength 5/5 to all lower extremity muscle groups bilaterally. No pain crepitus or joint limitation noted with ROM b/l. Hammertoes noted to the L 2nd toe and R 3rd toe. Patient ambulates independent of any assistive aids.  Neurological Examination: Protective sensation diminished with 10g monofilament b/l. Vibratory sensation diminished b/l. Clonus negative b/l. = Assessment: 1. Pain due to onychomycosis of toenails of both feet   2. Paronychia of great toe of right foot   3. Ingrown toenail without infection   4. Diabetic peripheral neuropathy associated with type 2 diabetes mellitus (HCC)    Plan: -Examined patient. -Toenails 1-5 b/l were debrided in length and girth with sterile nail nippers and dremel without iatrogenic bleeding.  -Offending nail border debrided and curretaged R hallux utilizing sterile nail nipper and currette. Border(s) cleansed with alcohol and triple antibiotic ointment applied. Dispensed written instructions for once daily epsom salt soaks for 14 days. Written instructions dispensed to patient. -Discussed chronicity of ingrown toenail of right hallux and possible need for permanent removal of affected border to prevent infection. Patient related understanding. We will continue to monitor.  -Patient to report any pedal injuries to medical professional immediately. -Patient to continue soft, supportive  shoe gear daily. -Patient/POA to call should there be question/concern in the interim.  Return in about 2 weeks (around 07/31/2019) for toe check right great toe.  Marzetta Board, DPM

## 2019-07-26 ENCOUNTER — Ambulatory Visit
Admission: RE | Admit: 2019-07-26 | Discharge: 2019-07-26 | Disposition: A | Discharge status: Home / Self Care | Payer: Medicare Other | Source: Ambulatory Visit | Attending: Internal Medicine | Admitting: Internal Medicine

## 2019-07-26 ENCOUNTER — Other Ambulatory Visit: Payer: Self-pay | Admitting: Internal Medicine

## 2019-07-26 DIAGNOSIS — R1084 Generalized abdominal pain: Secondary | ICD-10-CM | POA: Diagnosis not present

## 2019-07-26 DIAGNOSIS — R5383 Other fatigue: Secondary | ICD-10-CM | POA: Diagnosis not present

## 2019-07-26 DIAGNOSIS — I2510 Atherosclerotic heart disease of native coronary artery without angina pectoris: Secondary | ICD-10-CM | POA: Diagnosis not present

## 2019-07-26 DIAGNOSIS — E1142 Type 2 diabetes mellitus with diabetic polyneuropathy: Secondary | ICD-10-CM

## 2019-07-26 DIAGNOSIS — E782 Mixed hyperlipidemia: Secondary | ICD-10-CM | POA: Diagnosis not present

## 2019-07-26 DIAGNOSIS — R079 Chest pain, unspecified: Secondary | ICD-10-CM

## 2019-07-26 DIAGNOSIS — J986 Disorders of diaphragm: Secondary | ICD-10-CM | POA: Diagnosis not present

## 2019-07-26 DIAGNOSIS — I10 Essential (primary) hypertension: Secondary | ICD-10-CM | POA: Diagnosis not present

## 2019-07-26 DIAGNOSIS — Z0000 Encounter for general adult medical examination without abnormal findings: Secondary | ICD-10-CM | POA: Diagnosis not present

## 2019-07-26 DIAGNOSIS — Z1389 Encounter for screening for other disorder: Secondary | ICD-10-CM | POA: Diagnosis not present

## 2019-07-31 ENCOUNTER — Encounter: Payer: Self-pay | Admitting: Podiatry

## 2019-07-31 ENCOUNTER — Other Ambulatory Visit: Payer: Self-pay

## 2019-07-31 ENCOUNTER — Ambulatory Visit (INDEPENDENT_AMBULATORY_CARE_PROVIDER_SITE_OTHER): Payer: Medicare Other | Admitting: Podiatry

## 2019-07-31 DIAGNOSIS — M2042 Other hammer toe(s) (acquired), left foot: Secondary | ICD-10-CM | POA: Diagnosis not present

## 2019-07-31 DIAGNOSIS — E1142 Type 2 diabetes mellitus with diabetic polyneuropathy: Secondary | ICD-10-CM | POA: Diagnosis not present

## 2019-07-31 DIAGNOSIS — L03031 Cellulitis of right toe: Secondary | ICD-10-CM

## 2019-07-31 DIAGNOSIS — L600 Ingrowing nail: Secondary | ICD-10-CM | POA: Diagnosis not present

## 2019-07-31 DIAGNOSIS — M2041 Other hammer toe(s) (acquired), right foot: Secondary | ICD-10-CM | POA: Diagnosis not present

## 2019-08-03 NOTE — Progress Notes (Signed)
Subjective:  Wesley Winters presents today for nail check right hallux.  Patient states he has been following soaking instructions.   Objective: There were no vitals filed for this visit.  79 y.o. male, morbidly obese in NAD. AAO X 3.  Vascular Examination: Neurovascular status unchanged b/l lower extremities. Capillary refill time to digits immediate b/l. Palpable pedal pulses b/l LE. Palpable DP pulse(s) left lower extremity Faintly palpable DP pulse(s) right lower extremity. Faintly palpable PT pulse(s) b/l lower extremities. Pedal hair present. Lower extremity skin temperature gradient within normal limits. Trace edema noted b/l lower extremities.  Dermatological Examination: Pedal skin with normal turgor, texture and tone bilaterally. No open wounds bilaterally. No interdigital macerations bilaterally. Toenails 1-5 b/l elongated, discolored, dystrophic, thickened, crumbly with subungual debris and tenderness to dorsal palpation. Right hallux lateral border resolved. No nail border hypertrophy, no erythema, no drainage, no pain on palpation. Granulation tissue resolved.  Musculoskeletal Examination: Normal muscle strength 5/5 to all lower extremity muscle groups bilaterally. No pain crepitus or joint limitation noted with ROM b/l. Hammertoes noted to the L 2nd toe and R 3rd toe.  Neurological Examination: Protective sensation diminished with 10g monofilament b/l. Vibratory sensation diminished b/l.  Assessment: Resolved paronychia right hallux lateral border Ingrown toenail right hallux  Plan: -Examined patient. Informed Wesley Winters if condition recurs, there will be a need for permanent removal of affected border to prevent infection. He related understanding. -Continue diabetic foot care principles. -Patient to report any pedal injuries to medical professional immediately. -Patient to continue soft, supportive shoe gear daily. -Patient/POA to call should there be question/concern in  the interim.  Return in about 7 weeks (around 09/18/2019) for nail trim.

## 2019-08-16 DIAGNOSIS — E1149 Type 2 diabetes mellitus with other diabetic neurological complication: Secondary | ICD-10-CM | POA: Diagnosis not present

## 2019-08-16 DIAGNOSIS — R5383 Other fatigue: Secondary | ICD-10-CM | POA: Diagnosis not present

## 2019-08-16 DIAGNOSIS — J3489 Other specified disorders of nose and nasal sinuses: Secondary | ICD-10-CM | POA: Diagnosis not present

## 2019-08-21 DIAGNOSIS — H524 Presbyopia: Secondary | ICD-10-CM | POA: Diagnosis not present

## 2019-08-24 DIAGNOSIS — G4733 Obstructive sleep apnea (adult) (pediatric): Secondary | ICD-10-CM | POA: Diagnosis not present

## 2019-09-04 DIAGNOSIS — E782 Mixed hyperlipidemia: Secondary | ICD-10-CM | POA: Diagnosis not present

## 2019-09-04 DIAGNOSIS — E1149 Type 2 diabetes mellitus with other diabetic neurological complication: Secondary | ICD-10-CM | POA: Diagnosis not present

## 2019-09-04 DIAGNOSIS — I10 Essential (primary) hypertension: Secondary | ICD-10-CM | POA: Diagnosis not present

## 2019-09-04 DIAGNOSIS — E1142 Type 2 diabetes mellitus with diabetic polyneuropathy: Secondary | ICD-10-CM | POA: Diagnosis not present

## 2019-09-13 DIAGNOSIS — H25811 Combined forms of age-related cataract, right eye: Secondary | ICD-10-CM | POA: Diagnosis not present

## 2019-09-13 DIAGNOSIS — H52221 Regular astigmatism, right eye: Secondary | ICD-10-CM | POA: Diagnosis not present

## 2019-09-13 DIAGNOSIS — H2511 Age-related nuclear cataract, right eye: Secondary | ICD-10-CM | POA: Diagnosis not present

## 2019-09-13 DIAGNOSIS — H25011 Cortical age-related cataract, right eye: Secondary | ICD-10-CM | POA: Diagnosis not present

## 2019-09-13 DIAGNOSIS — H2181 Floppy iris syndrome: Secondary | ICD-10-CM | POA: Diagnosis not present

## 2019-09-18 ENCOUNTER — Ambulatory Visit: Payer: Medicare Other | Admitting: Podiatry

## 2019-09-18 ENCOUNTER — Encounter: Payer: Self-pay | Admitting: Podiatry

## 2019-09-18 ENCOUNTER — Other Ambulatory Visit: Payer: Self-pay

## 2019-09-18 DIAGNOSIS — M79675 Pain in left toe(s): Secondary | ICD-10-CM

## 2019-09-18 DIAGNOSIS — M79674 Pain in right toe(s): Secondary | ICD-10-CM | POA: Diagnosis not present

## 2019-09-18 DIAGNOSIS — E11610 Type 2 diabetes mellitus with diabetic neuropathic arthropathy: Secondary | ICD-10-CM

## 2019-09-18 DIAGNOSIS — B351 Tinea unguium: Secondary | ICD-10-CM | POA: Diagnosis not present

## 2019-09-18 DIAGNOSIS — S90414A Abrasion, right lesser toe(s), initial encounter: Secondary | ICD-10-CM

## 2019-09-18 DIAGNOSIS — E1142 Type 2 diabetes mellitus with diabetic polyneuropathy: Secondary | ICD-10-CM

## 2019-09-18 NOTE — Progress Notes (Signed)
Subjective: Wesley Winters presents today preventative diabetic foot care and painful thick toenails that are difficult to trim. Pain interferes with ambulation. Aggravating factors include wearing enclosed shoe gear. Pain is relieved with periodic professional debridement.   Mr. Wesley Winters states he hit his right 3rd toe on his bed wheel and it bled. He has been putting Mupirocin Ointment on his toe daily. He states he has enough ointment for now and will call his pharmacy if he needs a refill. He denies any pain to digit. Denies any fever, chills, nightsweats, nausea or vomiting.  Wesley Orn, MD is patient's PCP.   Past Medical History:  Diagnosis Date  . Balanitis   . Bladder calculus   . Coronary artery disease    stents x3 by Dr Tamala Julian 1998  . Depression   . Diabetes mellitus without complication ([place])   . GERD (gastroesophageal reflux disease)   . Hypercholesterolemia   . Hypertension   . Phimosis   . Prostate cancer ([place])   . Sleep apnea    wears CPAP nightly     Patient Active Problem List   Diagnosis Date Noted  . CAD in native artery 02/22/2016  . Hyperlipidemia LDL goal <70 02/22/2016  . Malignant neoplasm of prostate ([place]) 11/04/2014  . Diabetes ([place]) 11/04/2014    Current Outpatient Medications on File Prior to Visit  Medication Sig Dispense Refill  . acetaminophen (TYLENOL) 500 MG tablet Take 500-1,000 mg by mouth 3 (three) times daily as needed for moderate pain.    Marland Kitchen amLODipine (NORVASC) 10 MG tablet Take 10 mg by mouth daily.   2  . aspirin 81 MG tablet Take 81 mg by mouth daily.    Marland Kitchen atorvastatin (LIPITOR) 40 MG tablet Take 40 mg by mouth daily.    Marland Kitchen azelastine (ASTELIN) 0.1 % nasal spray Place 2 sprays into both nostrils 2 (two) times daily as needed for allergies. Use in each nostril as directed     . benazepril (LOTENSIN) 20 MG tablet Take 20 mg by mouth daily.  3  . betamethasone dipropionate 0.05 % cream Apply 1 application topically 2 (two) times daily.     . cholecalciferol (VITAMIN D3) 25 MCG (1000 UT) tablet Take 1,000 Units by mouth every evening.     . fexofenadine (ALLEGRA) 180 MG tablet Take 180 mg by mouth daily as needed for allergies.     . fluticasone (FLONASE) 50 MCG/ACT nasal spray Place 2 sprays into both nostrils daily as needed for allergies.     . hydrochlorothiazide (HYDRODIURIL) 25 MG tablet Take 25 mg by mouth daily.    . Insulin Glargine (TOUJEO MAX SOLOSTAR) 300 UNIT/ML SOPN Inject 25 Units into the skin every evening.     Marland Kitchen JANUVIA 100 MG tablet Take 100 mg by mouth daily.     . metFORMIN (GLUCOPHAGE-XR) 500 MG 24 hr tablet Take 1,000 mg by mouth 2 (two) times daily.   2  . mirabegron ER (MYRBETRIQ) 50 MG TB24 tablet Take 50 mg by mouth daily.    Marland Kitchen moxifloxacin (VIGAMOX) 0.5 % ophthalmic solution     . nebivolol (BYSTOLIC) 10 MG tablet Take 5 mg by mouth daily.     Glory Rosebush ULTRA test strip 1 each 3 (three) times daily.    . pantoprazole (PROTONIX) 40 MG tablet Take 40 mg by mouth 2 (two) times daily.    . polyethylene glycol powder (MIRALAX) powder Take 17 g by mouth every evening.     . potassium  chloride (K-DUR,KLOR-CON) 10 MEQ tablet Take 10 mEq by mouth 2 (two) times daily.     . prednisoLONE acetate (PRED FORTE) 1 % ophthalmic suspension 1 drop 4 (four) times daily.    Marland Kitchen PROLENSA 0.07 % SOLN Place 1 drop into the right eye daily.    . sucralfate (CARAFATE) 1 g tablet Take 1 g by mouth in the morning and at bedtime.    Nelva Nay SOLOSTAR 300 UNIT/ML Solostar Pen SMARTSIG:25 Unit(s) SUB-Q Every Evening    . triamcinolone cream (KENALOG) 0.1 % Apply 1 application topically 2 (two) times daily as needed (rash).     . vitamin B-12 (CYANOCOBALAMIN) 1000 MCG tablet Take 1,000 mcg by mouth daily.      No current facility-administered medications on file prior to visit.     Allergies  Allergen Reactions  . Amoxicillin Hives and Nausea Only    Objective: Wesley Winters is a pleasant 79 y.o. Caucasian male obese in NAD.  AAO x 3.  There were no vitals filed for this visit.  Vascular Examination: Neurovascular status unchanged b/l lower extremities. Capillary refill time to digits immediate b/l. Palpable DP pulse(s) left lower extremity Faintly palpable DP pulse(s) right lower extremity. Faintly palpable PT pulse(s) b/l lower extremities. Pedal hair present. Lower extremity skin temperature gradient within normal limits. Trace edema noted b/l lower extremities.  Dermatological Examination: Pedal skin with normal turgor, texture and tone bilaterally. No open wounds bilaterally. No interdigital macerations bilaterally. Toenails 1-5 b/l elongated, discolored, dystrophic, thickened, crumbly with subungual debris and tenderness to dorsal palpation. Incurvated nailplate lateral border border(s) R hallux.  Nail border hypertrophy absent. There is tenderness to palpation. Sign(s) of infection: no clinical signs of infection noted on examination today. Healing abrasion noted on dorsal aspect of right 3rd digit with scab formation. No erythema, no edema, no drainage, no fluctuance.    Musculoskeletal: Normal muscle strength 5/5 to all lower extremity muscle groups bilaterally. No pain crepitus or joint limitation noted with ROM b/l. Hammertoes noted to the L 2nd toe and R 3rd toe. Patient ambulates independent of any assistive aids. Charcot deformity right foot.  Neurological Examination: Protective sensation diminished with 10g monofilament b/l. Vibratory sensation diminished b/l. Clonus negative b/l.  Assessment: 1. Pain due to onychomycosis of toenails of both feet   2. Abrasion of lesser toe of right foot, initial encounter   3. 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 Diabetic peripheral neuropathy associated with type 2 diabetes mellitus (HCC)    Plan: -Examined patient. -Toenails 1-5 b/l were debrided in length and girth with sterile nail nippers and dremel without iatrogenic bleeding.  -For abrasion right 3rd toe,  apply Mupirocin Ointment once daily until healed. He will notify his pharmacy if he needs a refill. -Offending nail border debrided and curretaged R hallux utilizing sterile nail nipper and currette. No further treatment required by patient.  -Patient to report any pedal injuries to medical professional immediately. -Patient to continue soft, supportive shoe gear daily. -Patient/POA to call should there be question/concern in the interim.  Return in about 9 weeks (around 11/20/2019).  Marzetta Board, DPM

## 2019-09-19 DIAGNOSIS — K5909 Other constipation: Secondary | ICD-10-CM | POA: Diagnosis not present

## 2019-09-19 DIAGNOSIS — Z8679 Personal history of other diseases of the circulatory system: Secondary | ICD-10-CM | POA: Diagnosis not present

## 2019-09-19 DIAGNOSIS — K219 Gastro-esophageal reflux disease without esophagitis: Secondary | ICD-10-CM | POA: Diagnosis not present

## 2019-09-24 DIAGNOSIS — I10 Essential (primary) hypertension: Secondary | ICD-10-CM | POA: Diagnosis not present

## 2019-09-24 DIAGNOSIS — E1142 Type 2 diabetes mellitus with diabetic polyneuropathy: Secondary | ICD-10-CM | POA: Diagnosis not present

## 2019-09-24 DIAGNOSIS — E1149 Type 2 diabetes mellitus with other diabetic neurological complication: Secondary | ICD-10-CM | POA: Diagnosis not present

## 2019-09-24 DIAGNOSIS — E782 Mixed hyperlipidemia: Secondary | ICD-10-CM | POA: Diagnosis not present

## 2019-10-11 DIAGNOSIS — H2512 Age-related nuclear cataract, left eye: Secondary | ICD-10-CM | POA: Diagnosis not present

## 2019-10-11 DIAGNOSIS — H25012 Cortical age-related cataract, left eye: Secondary | ICD-10-CM | POA: Diagnosis not present

## 2019-10-11 DIAGNOSIS — H52222 Regular astigmatism, left eye: Secondary | ICD-10-CM | POA: Diagnosis not present

## 2019-10-11 DIAGNOSIS — H25812 Combined forms of age-related cataract, left eye: Secondary | ICD-10-CM | POA: Diagnosis not present

## 2019-11-11 ENCOUNTER — Other Ambulatory Visit: Payer: Self-pay | Admitting: Podiatry

## 2019-11-11 DIAGNOSIS — S91211A Laceration without foreign body of right great toe with damage to nail, initial encounter: Secondary | ICD-10-CM

## 2019-11-14 NOTE — Telephone Encounter (Signed)
Please advise 

## 2019-11-20 ENCOUNTER — Other Ambulatory Visit: Payer: Self-pay

## 2019-11-20 ENCOUNTER — Ambulatory Visit: Payer: Medicare Other | Admitting: Podiatry

## 2019-11-20 DIAGNOSIS — E1142 Type 2 diabetes mellitus with diabetic polyneuropathy: Secondary | ICD-10-CM

## 2019-11-20 DIAGNOSIS — E11610 Type 2 diabetes mellitus with diabetic neuropathic arthropathy: Secondary | ICD-10-CM

## 2019-11-20 DIAGNOSIS — B351 Tinea unguium: Secondary | ICD-10-CM | POA: Diagnosis not present

## 2019-11-20 DIAGNOSIS — M79674 Pain in right toe(s): Secondary | ICD-10-CM | POA: Diagnosis not present

## 2019-11-20 DIAGNOSIS — L600 Ingrowing nail: Secondary | ICD-10-CM | POA: Diagnosis not present

## 2019-11-20 DIAGNOSIS — M79675 Pain in left toe(s): Secondary | ICD-10-CM

## 2019-11-20 DIAGNOSIS — E119 Type 2 diabetes mellitus without complications: Secondary | ICD-10-CM

## 2019-11-20 NOTE — Progress Notes (Signed)
ANNUAL DIABETIC FOOT EXAM  Subjective: Wesley Winters presents today for for annual diabetic foot examination, at risk foot care with history of peripheral neuropathy and painful thick toenails that are difficult to trim. Pain interferes with ambulation. Aggravating factors include wearing enclosed shoe gear. Pain is relieved with periodic professional debridement..  Patient relates 21 year h/o diabetes.  Patient has h/o foot wound right hallux which healed.  Patient relates symptoms of foot numbness.  Patient relates symptoms of foot tingling.  Patient relates symptoms of burning in feet.  Patient's blood sugar was 118 mg/dl this morning.   [name], MD is patient's PCP. Last visit was July 26, 2019.  He states he did stub one of his toes.   Past Medical History:  Diagnosis Date  . Balanitis   . Bladder calculus   . Coronary artery disease    stents x3 by Dr Tamala Julian 1998  . Depression   . Diabetes mellitus without complication ([place])   . GERD (gastroesophageal reflux disease)   . Hypercholesterolemia   . Hypertension   . Phimosis   . Prostate cancer ([place])   . Sleep apnea    wears CPAP nightly   Patient Active Problem List   Diagnosis Date Noted  . CAD in native artery 02/22/2016  . Hyperlipidemia LDL goal <70 02/22/2016  . Malignant neoplasm of prostate ([place]) 11/04/2014  . Diabetes ([place]) 11/04/2014   Past Surgical History:  Procedure Laterality Date  . BIOPSY  07/10/2019   Procedure: BIOPSY;  Surgeon: Otis Brace, MD;  Location: [facility];  Service: Gastroenterology;;  . [place] Meek    . CYSTOSCOPY    . ESOPHAGOGASTRODUODENOSCOPY (EGD) WITH PROPOFOL N/A 07/10/2019   Procedure: ESOPHAGOGASTRODUODENOSCOPY (EGD) WITH PROPOFOL;  Surgeon: Otis Brace, MD;  Location: [facility];  Service: Gastroenterology;  Laterality: N/A;  . HERNIA REPAIR     umbilical  . PROSTATE BIOPSY    . PROSTATE BIOPSY    . PROSTATE BIOPSY    . three cardiac stents      Current Outpatient Medications on File Prior to Visit  Medication Sig Dispense Refill  . acetaminophen (TYLENOL) 500 MG tablet Take 500-1,000 mg by mouth 3 (three) times daily as needed for moderate pain.    Marland Kitchen amLODipine (NORVASC) 10 MG tablet Take 10 mg by mouth daily.   2  . aspirin 81 MG tablet Take 81 mg by mouth daily.    Marland Kitchen atorvastatin (LIPITOR) 40 MG tablet Take 40 mg by mouth daily.    Marland Kitchen augmented betamethasone dipropionate (DIPROLENE-AF) 0.05 % cream Apply topically.    Marland Kitchen azelastine (ASTELIN) 0.1 % nasal spray Place 2 sprays into both nostrils 2 (two) times daily as needed for allergies. Use in each nostril as directed     . B-D UF III MINI PEN NEEDLES 31G X 5 MM MISC SMARTSIG:1 SUB-Q Every Night    . benazepril (LOTENSIN) 20 MG tablet Take 20 mg by mouth daily.  3  . betamethasone dipropionate 0.05 % cream Apply 1 application topically 2 (two) times daily.    . cholecalciferol (VITAMIN D3) 25 MCG (1000 UT) tablet Take 1,000 Units by mouth every evening.     . fexofenadine (ALLEGRA) 180 MG tablet Take 180 mg by mouth daily as needed for allergies.     . fluticasone (FLONASE) 50 MCG/ACT nasal spray Place 2 sprays into both nostrils daily as needed for allergies.     . hydrochlorothiazide (HYDRODIURIL) 25 MG tablet Take 25 mg by mouth daily.    Marland Kitchen  Insulin Glargine (TOUJEO MAX SOLOSTAR) 300 UNIT/ML SOPN Inject 25 Units into the skin every evening.     Marland Kitchen JANUVIA 100 MG tablet Take 100 mg by mouth daily.     . metFORMIN (GLUCOPHAGE-XR) 500 MG 24 hr tablet Take 1,000 mg by mouth 2 (two) times daily.   2  . mirabegron ER (MYRBETRIQ) 50 MG TB24 tablet Take 50 mg by mouth daily.    Marland Kitchen moxifloxacin (VIGAMOX) 0.5 % ophthalmic solution     . mupirocin ointment (BACTROBAN) 2 % APPLY TO RIGHT GREAT TOE AND RIGHT 3RD TOE ONCE DAILY 22 g 0  . nebivolol (BYSTOLIC) 10 MG tablet Take 5 mg by mouth daily.     . NEBIVOLOL HCL PO     . ONETOUCH ULTRA test strip 1 each 3 (three) times daily.    .  pantoprazole (PROTONIX) 40 MG tablet Take 40 mg by mouth 2 (two) times daily.    . polyethylene glycol powder (MIRALAX) powder Take 17 g by mouth every evening.     . potassium chloride (K-DUR,KLOR-CON) 10 MEQ tablet Take 10 mEq by mouth 2 (two) times daily.     . prednisoLONE acetate (PRED FORTE) 1 % ophthalmic suspension 1 drop 4 (four) times daily.    Marland Kitchen PROLENSA 0.07 % SOLN Place 1 drop into the right eye daily.    . sucralfate (CARAFATE) 1 g tablet Take 1 g by mouth in the morning and at bedtime.    Nelva Nay SOLOSTAR 300 UNIT/ML Solostar Pen SMARTSIG:25 Unit(s) SUB-Q Every Evening    . triamcinolone cream (KENALOG) 0.1 % Apply 1 application topically 2 (two) times daily as needed (rash).     . vitamin B-12 (CYANOCOBALAMIN) 1000 MCG tablet Take 1,000 mcg by mouth daily.      No current facility-administered medications on file prior to visit.    Allergies  Allergen Reactions  . Amoxicillin Hives and Nausea Only   Social History   Occupational History  . Not on file  Tobacco Use  . Smoking status: Never Smoker  . Smokeless tobacco: Never Used  Substance and Sexual Activity  . Alcohol use: No  . Drug use: No  . Sexual activity: Yes   Family History  Problem Relation Age of Onset  . Cancer Brother        prostate  . Heart disease Brother   . Heart disease Mother   . Arrhythmia Mother   . Arrhythmia Brother     There is no immunization history on file for this patient.   Review of Systems: Negative except as noted in the HPI.  Objective: There were no vitals filed for this visit.  Wesley Winters is a pleasant 79 y.o. male in NAD. AAO X 3.  Vascular Examination: Capillary refill time to digits immediate b/l. Faintly palpable PT pulse(s) b/l lower extremities. DP pulse palpable left foot. DP pulse faintly palpable right foot. Lower extremity skin temperature gradient within normal limits. Trace edema noted b/l lower extremities.  Dermatological Examination: Pedal skin  with normal turgor, texture and tone bilaterally. No open wounds bilaterally. No interdigital macerations bilaterally. Toenails 1-5 b/l elongated, discolored, dystrophic, thickened, crumbly with subungual debris and tenderness to dorsal palpation. Incurvated nailplate lateral border(s) R hallux.  Nail border hypertrophy present. There is tenderness to palpation. Sign(s) of infection: no clinical signs of infection noted on examination today..  Musculoskeletal Examination: Normal muscle strength 5/5 to all lower extremity muscle groups bilaterally. Charcot deformity noted right foot.  Footwear  Assessment: Does the patient wear appropriate shoes? Yes. Does the patient need inserts/orthotics? Yes..  Neurological Examination: Pt has subjective symptoms of neuropathy. Protective sensation diminished with 10g monofilament b/l. Vibratory sensation diminished b/l.  Assessment: 1. Pain due to onychomycosis of toenails of both feet   2. Ingrown toenail without infection   3. 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 Diabetic peripheral neuropathy associated with type 2 diabetes mellitus ([place])   5. Encounter for diabetic foot exam ([place])      ADA Risk Categorization: High Risk  Patient has one or more of the following: Loss of protective sensation Absent pedal pulses Severe Foot deformity History of foot ulcer  Plan: -Examined patient. -Diabetic foot examination performed on today's visit. -Patient to continue soft, supportive shoe gear daily. -Toenails 1-5 b/l were debrided in length and girth with sterile nail nippers and dremel without iatrogenic bleeding.  -Offending nail border debrided and curretaged R hallux utilizing sterile nail nipper and currette. Light bleeding addressed with Lumicain Hemostatic solution. Border(s) cleansed with alcohol and triple antibiotic ointmetn applied. Patient instructed to apply Mupirocin Ointment to R hallux once daily for 7 days. -Patient to report any pedal  injuries to medical professional immediately. -Patient instructed to apply Mupirocin Ointment to affected digits once daily for one week. -Patient/POA to call should there be question/concern in the interim.  Return in about 3 months (around 02/20/2020) for diabetic foot care.  Marzetta Board, DPM

## 2019-11-22 ENCOUNTER — Encounter: Payer: Self-pay | Admitting: Podiatry

## 2019-11-22 DIAGNOSIS — Z85828 Personal history of other malignant neoplasm of skin: Secondary | ICD-10-CM | POA: Diagnosis not present

## 2019-11-22 DIAGNOSIS — L905 Scar conditions and fibrosis of skin: Secondary | ICD-10-CM | POA: Diagnosis not present

## 2019-11-22 DIAGNOSIS — C44529 Squamous cell carcinoma of skin of other part of trunk: Secondary | ICD-10-CM | POA: Diagnosis not present

## 2019-11-22 DIAGNOSIS — D1801 Hemangioma of skin and subcutaneous tissue: Secondary | ICD-10-CM | POA: Diagnosis not present

## 2019-11-22 DIAGNOSIS — L819 Disorder of pigmentation, unspecified: Secondary | ICD-10-CM | POA: Diagnosis not present

## 2019-11-26 DIAGNOSIS — G4733 Obstructive sleep apnea (adult) (pediatric): Secondary | ICD-10-CM | POA: Diagnosis not present

## 2019-12-03 DIAGNOSIS — E1122 Type 2 diabetes mellitus with diabetic chronic kidney disease: Secondary | ICD-10-CM | POA: Diagnosis not present

## 2019-12-03 DIAGNOSIS — N182 Chronic kidney disease, stage 2 (mild): Secondary | ICD-10-CM | POA: Diagnosis not present

## 2019-12-03 DIAGNOSIS — E1142 Type 2 diabetes mellitus with diabetic polyneuropathy: Secondary | ICD-10-CM | POA: Diagnosis not present

## 2019-12-03 DIAGNOSIS — Z794 Long term (current) use of insulin: Secondary | ICD-10-CM | POA: Diagnosis not present

## 2019-12-11 DIAGNOSIS — N401 Enlarged prostate with lower urinary tract symptoms: Secondary | ICD-10-CM | POA: Diagnosis not present

## 2019-12-14 DIAGNOSIS — I10 Essential (primary) hypertension: Secondary | ICD-10-CM | POA: Diagnosis not present

## 2019-12-14 DIAGNOSIS — E782 Mixed hyperlipidemia: Secondary | ICD-10-CM | POA: Diagnosis not present

## 2019-12-14 DIAGNOSIS — E1149 Type 2 diabetes mellitus with other diabetic neurological complication: Secondary | ICD-10-CM | POA: Diagnosis not present

## 2019-12-14 DIAGNOSIS — E1142 Type 2 diabetes mellitus with diabetic polyneuropathy: Secondary | ICD-10-CM | POA: Diagnosis not present

## 2019-12-18 DIAGNOSIS — Z8546 Personal history of malignant neoplasm of prostate: Secondary | ICD-10-CM | POA: Diagnosis not present

## 2019-12-18 DIAGNOSIS — H35351 Cystoid macular degeneration, right eye: Secondary | ICD-10-CM | POA: Diagnosis not present

## 2019-12-18 DIAGNOSIS — R350 Frequency of micturition: Secondary | ICD-10-CM | POA: Diagnosis not present

## 2020-01-06 DIAGNOSIS — G4733 Obstructive sleep apnea (adult) (pediatric): Secondary | ICD-10-CM | POA: Diagnosis not present

## 2020-01-15 DIAGNOSIS — H35351 Cystoid macular degeneration, right eye: Secondary | ICD-10-CM | POA: Diagnosis not present

## 2020-01-17 DIAGNOSIS — Z85828 Personal history of other malignant neoplasm of skin: Secondary | ICD-10-CM | POA: Diagnosis not present

## 2020-01-17 DIAGNOSIS — L905 Scar conditions and fibrosis of skin: Secondary | ICD-10-CM | POA: Diagnosis not present

## 2020-01-17 DIAGNOSIS — L819 Disorder of pigmentation, unspecified: Secondary | ICD-10-CM | POA: Diagnosis not present

## 2020-01-24 DIAGNOSIS — Z961 Presence of intraocular lens: Secondary | ICD-10-CM | POA: Diagnosis not present

## 2020-01-25 DIAGNOSIS — I10 Essential (primary) hypertension: Secondary | ICD-10-CM | POA: Diagnosis not present

## 2020-01-25 DIAGNOSIS — E1142 Type 2 diabetes mellitus with diabetic polyneuropathy: Secondary | ICD-10-CM | POA: Diagnosis not present

## 2020-01-25 DIAGNOSIS — I2510 Atherosclerotic heart disease of native coronary artery without angina pectoris: Secondary | ICD-10-CM | POA: Diagnosis not present

## 2020-01-25 DIAGNOSIS — E782 Mixed hyperlipidemia: Secondary | ICD-10-CM | POA: Diagnosis not present

## 2020-02-25 ENCOUNTER — Encounter: Payer: Self-pay | Admitting: Podiatry

## 2020-02-25 ENCOUNTER — Other Ambulatory Visit: Payer: Self-pay

## 2020-02-25 ENCOUNTER — Ambulatory Visit: Payer: Medicare Other | Admitting: Podiatry

## 2020-02-25 DIAGNOSIS — M79675 Pain in left toe(s): Secondary | ICD-10-CM

## 2020-02-25 DIAGNOSIS — L600 Ingrowing nail: Secondary | ICD-10-CM

## 2020-02-25 DIAGNOSIS — E1142 Type 2 diabetes mellitus with diabetic polyneuropathy: Secondary | ICD-10-CM

## 2020-02-25 DIAGNOSIS — E11610 Type 2 diabetes mellitus with diabetic neuropathic arthropathy: Secondary | ICD-10-CM

## 2020-02-25 DIAGNOSIS — B351 Tinea unguium: Secondary | ICD-10-CM

## 2020-02-25 DIAGNOSIS — M79674 Pain in right toe(s): Secondary | ICD-10-CM | POA: Diagnosis not present

## 2020-03-01 NOTE — Progress Notes (Signed)
Subjective: Wesley Winters presents today for at risk foot care with history of peripheral neuropathy and painful thick toenails that are difficult to trim. Pain interferes with ambulation. Aggravating factors include wearing enclosed shoe gear. Pain is relieved with periodic professional debridement.[name], MD is patient's PCP. Last visit was 02/12/2020.  Allergies  Allergen Reactions  . Amoxicillin Hives and Nausea Only  . Amoxicillin-Pot Clavulanate     Other reaction(s): rash  . Empagliflozin     Other reaction(s): jittery and nausea  . Metformin Hcl     Other reaction(s): diarrhea   Review of Systems: Negative except as noted in the HPI.  Objective: There were no vitals filed for this visit.  Wesley Winters is a pleasant 80 y.o. male in NAD. AAO X 3.  Vascular Examination: Capillary refill time to digits immediate b/l. Faintly palpable PT pulse(s) b/l lower extremities. DP pulse palpable left foot. DP pulse faintly palpable right foot. Lower extremity skin temperature gradient within normal limits. Trace edema noted b/l lower extremities.  Dermatological Examination: Pedal skin with normal turgor, texture and tone bilaterally. No open wounds bilaterally. No interdigital macerations bilaterally. Toenails 1-5 b/l elongated, discolored, dystrophic, thickened, crumbly with subungual debris and tenderness to dorsal palpation. Incurvated nailplate lateral border(s) R hallux and medial border left hallux.  Nail border hypertrophy present. There is tenderness to palpation. Sign(s) of infection: no clinical signs of infection noted on examination today. Pinpoint bleeding with debridement.  Musculoskeletal Examination: Normal muscle strength 5/5 to all lower extremity muscle groups bilaterally. Charcot deformity noted right foot.  Neurological Examination: Pt has subjective symptoms of neuropathy. Protective sensation diminished with 10g monofilament b/l. Vibratory sensation  diminished b/l.  Assessment: 1. Pain due to onychomycosis of toenails of both feet   2. Ingrown toenail without infection   3. Diabetic Charcot's foot ([place])   4. Diabetic peripheral neuropathy associated with type 2 diabetes mellitus ([place])     Plan: -Examined patient. -Continue diabetic foot care principles. -Patient to continue soft, supportive shoe gear daily. -Toenails 2-5 b/l were debrided in length and girth with sterile nail nippers and dremel without iatrogenic bleeding.  -Offending nail border debrided and curretaged R hallux and L hallux utilizing sterile nail nipper and currette. Light bleeding addressed with Lumicain Hemostatic solution. Border(s) cleansed with alcohol and triple antibiotic ointmetn applied. Patient instructed to apply Neosporin Ointment to both great toes once daily for 7 days. Call office if he has any problems. -Patient to report any pedal injuries to medical professional immediately. -Patient/POA to call should there be question/concern in the interim.  Return in about 3 months (around 05/24/2020).  Marzetta Board, DPM

## 2020-03-03 DIAGNOSIS — J302 Other seasonal allergic rhinitis: Secondary | ICD-10-CM | POA: Diagnosis not present

## 2020-03-03 DIAGNOSIS — E1142 Type 2 diabetes mellitus with diabetic polyneuropathy: Secondary | ICD-10-CM | POA: Diagnosis not present

## 2020-03-03 DIAGNOSIS — K219 Gastro-esophageal reflux disease without esophagitis: Secondary | ICD-10-CM | POA: Diagnosis not present

## 2020-03-03 DIAGNOSIS — I10 Essential (primary) hypertension: Secondary | ICD-10-CM | POA: Diagnosis not present

## 2020-03-07 DIAGNOSIS — K5909 Other constipation: Secondary | ICD-10-CM | POA: Diagnosis not present

## 2020-03-07 DIAGNOSIS — K219 Gastro-esophageal reflux disease without esophagitis: Secondary | ICD-10-CM | POA: Diagnosis not present

## 2020-03-07 DIAGNOSIS — Z8679 Personal history of other diseases of the circulatory system: Secondary | ICD-10-CM | POA: Diagnosis not present

## 2020-03-20 DIAGNOSIS — E782 Mixed hyperlipidemia: Secondary | ICD-10-CM | POA: Diagnosis not present

## 2020-03-20 DIAGNOSIS — I10 Essential (primary) hypertension: Secondary | ICD-10-CM | POA: Diagnosis not present

## 2020-03-20 DIAGNOSIS — N400 Enlarged prostate without lower urinary tract symptoms: Secondary | ICD-10-CM | POA: Diagnosis not present

## 2020-03-20 DIAGNOSIS — E1142 Type 2 diabetes mellitus with diabetic polyneuropathy: Secondary | ICD-10-CM | POA: Diagnosis not present

## 2020-03-24 DIAGNOSIS — H35351 Cystoid macular degeneration, right eye: Secondary | ICD-10-CM | POA: Diagnosis not present

## 2020-03-28 DIAGNOSIS — R21 Rash and other nonspecific skin eruption: Secondary | ICD-10-CM | POA: Diagnosis not present

## 2020-03-28 DIAGNOSIS — J300 Vasomotor rhinitis: Secondary | ICD-10-CM | POA: Diagnosis not present

## 2020-03-28 DIAGNOSIS — J309 Allergic rhinitis, unspecified: Secondary | ICD-10-CM | POA: Diagnosis not present

## 2020-03-28 DIAGNOSIS — K219 Gastro-esophageal reflux disease without esophagitis: Secondary | ICD-10-CM | POA: Diagnosis not present

## 2020-04-01 DIAGNOSIS — G4733 Obstructive sleep apnea (adult) (pediatric): Secondary | ICD-10-CM | POA: Diagnosis not present

## 2020-04-09 DIAGNOSIS — L905 Scar conditions and fibrosis of skin: Secondary | ICD-10-CM | POA: Diagnosis not present

## 2020-04-09 DIAGNOSIS — D229 Melanocytic nevi, unspecified: Secondary | ICD-10-CM | POA: Diagnosis not present

## 2020-04-09 DIAGNOSIS — L570 Actinic keratosis: Secondary | ICD-10-CM | POA: Diagnosis not present

## 2020-04-09 DIAGNOSIS — L821 Other seborrheic keratosis: Secondary | ICD-10-CM | POA: Diagnosis not present

## 2020-04-22 NOTE — Progress Notes (Signed)
Cardiology Office Note:    Date:  04/23/2020   ID:  Wesley Winters, DOB 1940-02-04, MRN [id number]  PCP:  [name], MD  Cardiologist:  Sinclair Grooms, MD   Referring MD: [name], MD   Chief Complaint  Patient presents with  . Coronary Artery Disease  . Hypertension  . Hyperlipidemia    History of Present Illness:    Wesley Winters is a 80 y.o. male with a hx of primary hypertension, hyperlipidemia, OSA, CAD with stent to LAD and RCA proximal and mid stents in 1998 and 1999 then cardiac cath in 2005 with patent coronary arteries. ZJ69 to 50% 2017.  He has noted shortness of breath and exertional fatigue recently.  No history of atrial fibrillation.  Denies angina.  Decreased energy has been noted.  Wife says this is been going on since February.  He just wants to sleep all the time.  Not much lower extremity swelling.  Difficulty with "allergies".  Not breathing as well as he is used to.  Past Medical History:  Diagnosis Date  . Balanitis   . Bladder calculus   . Coronary artery disease    stents x3 by Dr Tamala Julian 1998  . Depression   . Diabetes mellitus without complication ([place])   . GERD (gastroesophageal reflux disease)   . Hypercholesterolemia   . Hypertension   . Phimosis   . Prostate cancer ([place])   . Sleep apnea    wears CPAP nightly    Past Surgical History:  Procedure Laterality Date  . BIOPSY  07/10/2019   Procedure: BIOPSY;  Surgeon: Otis Brace, MD;  Location: [facility];  Service: Gastroenterology;;  . [place] Meek    . CYSTOSCOPY    . ESOPHAGOGASTRODUODENOSCOPY (EGD) WITH PROPOFOL N/A 07/10/2019   Procedure: ESOPHAGOGASTRODUODENOSCOPY (EGD) WITH PROPOFOL;  Surgeon: Otis Brace, MD;  Location: [facility];  Service: Gastroenterology;  Laterality: N/A;  . HERNIA REPAIR     umbilical  . PROSTATE BIOPSY    . PROSTATE BIOPSY    . PROSTATE BIOPSY    . three cardiac stents      Current Medications: Current Meds  Medication Sig  .  acetaminophen (TYLENOL) 500 MG tablet Take 500-1,000 mg by mouth 3 (three) times daily as needed for moderate pain.  Marland Kitchen amiodarone (PACERONE) 200 MG tablet Take 1 tablet (200 mg total) by mouth 2 (two) times daily.  Marland Kitchen amLODipine (NORVASC) 10 MG tablet Take 10 mg by mouth daily.   Marland Kitchen apixaban (ELIQUIS) 5 MG TABS tablet Take 1 tablet (5 mg total) by mouth 2 (two) times daily.  Marland Kitchen atorvastatin (LIPITOR) 40 MG tablet Take 40 mg by mouth daily.  Marland Kitchen augmented betamethasone dipropionate (DIPROLENE-AF) 0.05 % cream Apply topically.  Marland Kitchen azelastine (ASTELIN) 0.1 % nasal spray Place 2 sprays into both nostrils 2 (two) times daily as needed for allergies. Use in each nostril as directed  . B-D UF III MINI PEN NEEDLES 31G X 5 MM MISC SMARTSIG:1 SUB-Q Every Night  . benazepril (LOTENSIN) 20 MG tablet Take 20 mg by mouth daily.  . betamethasone dipropionate 0.05 % cream Apply 1 application topically 2 (two) times daily.  . cetirizine (ZYRTEC ALLERGY) 10 MG tablet 1 tablet  . cholecalciferol (VITAMIN D3) 25 MCG (1000 UT) tablet Take 1,000 Units by mouth every evening.   . Cyanocobalamin 1000 MCG/15ML LIQD 1 tablet  . fluticasone (FLONASE) 50 MCG/ACT nasal spray Place 2 sprays into both nostrils daily as needed for allergies.   Marland Kitchen  furosemide (LASIX) 20 MG tablet Take 1 tablet (20 mg total) by mouth daily.  . hydrochlorothiazide (HYDRODIURIL) 25 MG tablet Take 25 mg by mouth daily.  . Insulin Glargine 300 UNIT/ML SOPN Inject 25 Units into the skin every evening.   . metFORMIN (GLUCOPHAGE-XR) 500 MG 24 hr tablet Take 1,000 mg by mouth 2 (two) times daily.   . mupirocin ointment (BACTROBAN) 2 % APPLY TO RIGHT GREAT TOE AND RIGHT 3RD TOE ONCE DAILY  . nebivolol (BYSTOLIC) 10 MG tablet Take 10 mg by mouth daily. Per patient taking 1/2 tablet  . ONETOUCH ULTRA test strip 1 each 3 (three) times daily.  . pantoprazole (PROTONIX) 20 MG tablet Take 20 mg by mouth daily.  . polyethylene glycol (MIRALAX / GLYCOLAX) 17 g packet 1  packet mixed with 8 ounces of fluid  . potassium chloride (K-DUR,KLOR-CON) 10 MEQ tablet Take 10 mEq by mouth 2 (two) times daily.   Marland Kitchen Respiratory Therapy Supplies (CARETOUCH 2 CPAP HOSE HANGER) MISC 11cm  . sucralfate (CARAFATE) 1 g tablet Take 1 g by mouth in the morning and at bedtime.  Nelva Nay SOLOSTAR 300 UNIT/ML Solostar Pen SMARTSIG:25 Unit(s) SUB-Q Every Evening  . triamcinolone cream (KENALOG) 0.1 % Apply 1 application topically 2 (two) times daily as needed (rash).   . vitamin B-12 (CYANOCOBALAMIN) 1000 MCG tablet Take 1,000 mcg by mouth daily.   . [DISCONTINUED] aspirin 81 MG tablet Take 81 mg by mouth daily.     Allergies:   Amoxicillin, Amoxicillin-pot clavulanate, Empagliflozin, and Metformin hcl   Social History   Socioeconomic History  . Marital status: Married    Spouse name: Not on file  . Number of children: Not on file  . Years of education: Not on file  . Highest education level: Not on file  Occupational History  . Not on file  Tobacco Use  . Smoking status: Never Smoker  . Smokeless tobacco: Never Used  Substance and Sexual Activity  . Alcohol use: No  . Drug use: No  . Sexual activity: Yes  Other Topics Concern  . Not on file  Social History Narrative  . Not on file   Social Determinants of Health   Financial Resource Strain: Not on file  Food Insecurity: Not on file  Transportation Needs: Not on file  Physical Activity: Not on file  Stress: Not on file  Social Connections: Not on file     Family History: The patient's family history includes Arrhythmia in his brother and mother; Cancer in his brother; Heart disease in his brother and mother.  ROS:   Please see the history of present illness.    Fatigue and just feels that he is not doing well.  Cannot explain it.  All other systems reviewed and are negative.  EKGs/Labs/Other Studies Reviewed:    The following studies were reviewed today:  ECHOCARDIOGRAM 2020: IMPRESSIONS    1. The  left ventricle has mildly reduced systolic function, with an  ejection fraction of 45-50%. The cavity size was moderately dilated. There  is mild concentric left ventricular hypertrophy. Left ventricular  diastolic Doppler parameters are consistent  with impaired relaxation. Indeterminate filling pressures.  2. Mild hypokinesis of the inferolateral myocardium.  3. The right ventricle has normal systolic function. The cavity was  normal. There is no increase in right ventricular wall thickness.   EKG:  EKG demonstrates atrial fibrillation with poor rate control.  There is also interventricular conduction delay.  Recent Labs: 07/11/2019: BUN 17; Creatinine,  Ser 1.16; Hemoglobin 13.4; NT-Pro BNP 445; Platelets 228; Potassium 4.4; Sodium 139  Recent Lipid Panel No results found for: CHOL, TRIG, HDL, CHOLHDL, VLDL, LDLCALC, LDLDIRECT  Physical Exam:    VS:  BP 110/60   Pulse (!) 105   Ht 5\' 8"  (1.727 m)   Wt 223 lb 3.2 oz (101.2 kg)   SpO2 96%   BMI 33.94 kg/m     Wt Readings from Last 3 Encounters:  04/23/20 223 lb 3.2 oz (101.2 kg)  07/11/19 216 lb (98 kg)  07/05/19 240 lb (108.9 kg)     GEN: Obese. No acute distress HEENT: Normal NECK: Moderate jugular venous distention with patient lying at 45 degrees. LYMPHATICS: No lymphadenopathy CARDIAC; no murmur. Rapid IIRR no gallop, or edema. VASCULAR:  Normal Pulses. No bruits. RESPIRATORY:  Clear to auscultation without rales, wheezing or rhonchi  ABDOMEN: Soft, non-tender, non-distended, No pulsatile mass, MUSCULOSKELETAL: No deformity  SKIN: Warm and dry NEUROLOGIC:  Alert and oriented x 3 PSYCHIATRIC:  Normal affect   ASSESSMENT:    1. Atrial fibrillation, unspecified type ([place])   2. SOB (shortness of breath)   3. CAD in native artery   4. Hyperlipidemia LDL goal <70   5. Type 2 diabetes mellitus with other circulatory complication, without long-term current use of insulin ([place])   6. Essential hypertension   7.  Anticoagulation adequate    PLAN:    In order of problems listed above:   New onset atrial fibrillation.  Based upon the history from the patient and wife, he has been short of breath and having decreased energy since February.  Has not been able to sleep well.  Chads Vascular score is greater than       2.  Needs anticoagulation therapy which will be started at 5 mg twice daily unless the creatinine is elevated.  We             will start amiodarone to help slow his heart rate, 200 mg twice daily.  He will wear a 14-day monitor to determine              rate control and whether or not atrial fibrillation is continuous.  May ultimately need cardioversion. 1. Shortness of breath is related to diastolic heart failure.  2D Doppler echocardiogram to rule out systolic dysfunction related to poor rate control 2. No symptoms of angina currently. 3. Did not discuss lipids. 4. Did not discuss diabetes. 5. Blood pressure is adequate to tolerate addition of Lasix.  Will make decision about whether to continue both Lasix and HydroDIURIL after laboratory data is available. 6. CBC is obtained today  He needs to follow-up with [place] and 2 weeks.  EKG will be done at that time.  Decision concerning cardioversion will be made at that time.  The natural history of atrial fibrillation was discussed.  The inability to cure and the possibility of recurrences was clearly stated.  Management strategies including rhythm control with antiarrhythmic therapy and/or ablation,  rate control (beta-blocker therapy, digoxin, or AV node blocking CCB therapy), and  occasional need for pacemaker therapy were reviewed.  Stroke risk (as determined by CHADS VASC score >1). The requirement for and the duration/permanence of anti-coagulation therapy will be determined by context of individual risk factors and burden of AF.  Anticoagulation risks (vitamin K antagonists v DOAC therapy) were reviewed, highlighting the lower bleeding  ris@10RELATIVEDAYS @ k and improved safety with DOAC's. Shared decision making was stressed.  Medication Adjustments/Labs and Tests Ordered: Current medicines are reviewed at length with the patient today.  Concerns regarding medicines are outlined above.  Orders Placed This Encounter  Procedures  . Basic metabolic panel  . CBC  . Pro b natriuretic peptide  . TSH  . Hepatic function panel  . LONG TERM MONITOR (3-14 DAYS)  . EKG 12-Lead  . ECHOCARDIOGRAM COMPLETE   Meds ordered this encounter  Medications  . amiodarone (PACERONE) 200 MG tablet    Sig: Take 1 tablet (200 mg total) by mouth 2 (two) times daily.    Dispense:  180 tablet    Refill:  0  . apixaban (ELIQUIS) 5 MG TABS tablet    Sig: Take 1 tablet (5 mg total) by mouth 2 (two) times daily.    Dispense:  60 tablet    Refill:  11  . furosemide (LASIX) 20 MG tablet    Sig: Take 1 tablet (20 mg total) by mouth daily.    Dispense:  90 tablet    Refill:  3    Patient Instructions  Medication Instructions:  1) START Eliquis 5mg  twice daily.  If the cost of Eliquis is too high, please call [phone] and let them know you need assistance with cost.   2) START Amiodarone 200mg  twice daily  3) START Furosemide 20mg  once daily  4) DISCONTINUE Aspirin  *If you need a refill on your cardiac medications before your next appointment, please call your pharmacy*   Lab Work: BMET, CBC, TSH, Liver and Pro BNP today  If you have labs (blood work) drawn today and your tests are completely normal, you will receive your results only by: Marland Kitchen MyChart Message (if you have MyChart) OR . A paper copy in the mail If you have any lab test that is abnormal or we need to change your treatment, we will call you to review the results.   Testing/Procedures: Your physician has requested that you have an echocardiogram (would love to have before he sees Dr. Tamala Julian back if at all possible). Echocardiography is a painless test that  uses sound waves to create images of your heart. It provides your doctor with information about the size and shape of your heart and how well your heart's chambers and valves are working. This procedure takes approximately one hour. There are no restrictions for this procedure.  Your physician recommends that you wear a monitor for 2 weeks.   Follow-Up: At [facility], you and your health needs are our priority.  As part of our continuing mission to provide you with exceptional heart care, we have created designated Provider Care Teams.  These Care Teams include your primary Cardiologist (physician) and Advanced Practice Providers (APPs -  Physician Assistants and Nurse Practitioners) who all work together to provide you with the care you need, when you need it.  We recommend signing up for the patient portal called "MyChart".  Sign up information is provided on this After Visit Summary.  MyChart is used to connect with patients for Virtual Visits (Telemedicine).  Patients are able to view lab/test results, encounter notes, upcoming appointments, etc.  Non-urgent messages can be sent to your provider as well.   To learn more about what you can do with MyChart, go to [email or web address].    Your next appointment:   2 week(s)- can use May 9th at 1040am  The format for your next appointment:   In Person  Provider:   You may see Belva Crome III,  MD or one of the following Advanced Practice Providers on your designated Care Team:    Kathyrn Drown, NP    Other Instructions  [place] Monitor Instructions   Your physician has requested you wear a ZIO patch monitor for 14 days.  This is a single patch monitor.   [employer] supplies one patch monitor per enrollment. Additional stickers are not available. Please do not apply patch if you will be having a Nuclear Stress Test, Echocardiogram, Cardiac CT, MRI, or Chest Xray during the period you would be wearing the monitor. The patch  cannot be worn during these tests. You cannot remove and re-apply the ZIO XT patch monitor.  Your ZIO patch monitor will be sent Fed Ex from [employer] directly to your home address. It may take 3-5 days to receive your monitor after you have been enrolled.  Once you have received your monitor, please review the enclosed instructions. Your monitor has already been registered assigning a specific monitor serial # to you.  Billing and Patient Assistance Program Information   We have supplied [employer] with any of your insurance information on file for billing purposes. [employer] offers a sliding scale Patient Assistance Program for patients that do not have insurance, or whose insurance does not completely cover the cost of the ZIO monitor.   You must apply for the Patient Assistance Program to qualify for this discounted rate.     To apply, please call [employer] at [phone], select option 4, then select option 2, and ask to apply for Patient Assistance Program.  Theodore Demark will ask your household income, and how many people are in your household.  They will quote your out-of-pocket cost based on that information.  [employer] will also be able to set up a 61-month, interest-free payment plan if needed.  Applying the monitor   Shave hair from upper left chest.  Hold abrader disc by orange tab. Rub abrader in 40 strokes over the upper left chest as indicated in your monitor instructions.  Clean area with 4 enclosed alcohol pads. Let dry.  Apply patch as indicated in monitor instructions. Patch will be placed under collarbone on left side of chest with arrow pointing upward.  Rub patch adhesive wings for 2 minutes. Remove white label marked "1". Remove the white label marked "2". Rub patch adhesive wings for 2 additional minutes.  While looking in a mirror, press and release button in center of patch. A small green light will flash 3-4 times. This will be your only indicator that the monitor has been  turned on. ?  Do not shower for the first 24 hours. You may shower after the first 24 hours.  Press the button if you feel a symptom. You will hear a small click. Record Date, Time and Symptom in the Patient Logbook.  When you are ready to remove the patch, follow instructions on the last 2 pages of the Patient Logbook. Stick patch monitor onto the last page of Patient Logbook.  Place Patient Logbook in the blue and white box.  Use locking tab on box and tape box closed securely.  The blue and white box has prepaid postage on it. Please place it in the mailbox as soon as possible. Your physician should have your test results approximately 7 days after the monitor has been mailed back to [facility].  Call [place] at [phone] if you have questions regarding your ZIO XT patch monitor. Call them immediately if you see an  orange light blinking on your monitor.  If your monitor falls off in less than 4 days, contact our Monitor department at [phone]. ?If your monitor becomes loose or falls off after 4 days call [employer] at [phone] for suggestions on securing your monitor.?      Signed, Sinclair Grooms, MD  04/23/2020 2:22 PM    [facility]

## 2020-04-23 ENCOUNTER — Ambulatory Visit (INDEPENDENT_AMBULATORY_CARE_PROVIDER_SITE_OTHER): Payer: Medicare Other

## 2020-04-23 ENCOUNTER — Ambulatory Visit: Payer: Medicare Other | Admitting: Interventional Cardiology

## 2020-04-23 ENCOUNTER — Encounter (INDEPENDENT_AMBULATORY_CARE_PROVIDER_SITE_OTHER): Payer: Self-pay

## 2020-04-23 ENCOUNTER — Encounter: Payer: Self-pay | Admitting: Radiology

## 2020-04-23 ENCOUNTER — Other Ambulatory Visit: Payer: Self-pay

## 2020-04-23 ENCOUNTER — Encounter: Payer: Self-pay | Admitting: Interventional Cardiology

## 2020-04-23 VITALS — BP 110/60 | HR 105 | Ht 68.0 in | Wt 223.2 lb

## 2020-04-23 DIAGNOSIS — I4891 Unspecified atrial fibrillation: Secondary | ICD-10-CM

## 2020-04-23 DIAGNOSIS — R0602 Shortness of breath: Secondary | ICD-10-CM

## 2020-04-23 DIAGNOSIS — E785 Hyperlipidemia, unspecified: Secondary | ICD-10-CM

## 2020-04-23 DIAGNOSIS — I2510 Atherosclerotic heart disease of native coronary artery without angina pectoris: Secondary | ICD-10-CM | POA: Diagnosis not present

## 2020-04-23 DIAGNOSIS — Z7901 Long term (current) use of anticoagulants: Secondary | ICD-10-CM

## 2020-04-23 DIAGNOSIS — I10 Essential (primary) hypertension: Secondary | ICD-10-CM | POA: Diagnosis not present

## 2020-04-23 DIAGNOSIS — E1159 Type 2 diabetes mellitus with other circulatory complications: Secondary | ICD-10-CM

## 2020-04-23 MED ORDER — APIXABAN 5 MG PO TABS: 5.0000 mg | ORAL_TABLET | Freq: Two times a day (BID) | ORAL | 11 refills | Status: DC

## 2020-04-23 MED ORDER — AMIODARONE HCL 200 MG PO TABS: 200.0000 mg | ORAL_TABLET | Freq: Two times a day (BID) | ORAL | 0 refills | Status: DC

## 2020-04-23 MED ORDER — FUROSEMIDE 20 MG PO TABS: 20.0000 mg | ORAL_TABLET | Freq: Every day | ORAL | 3 refills | Status: DC

## 2020-04-23 NOTE — Progress Notes (Signed)
Enrolled pt for a Zio XT to be mailed to pt's home.

## 2020-04-23 NOTE — Patient Instructions (Addendum)
Medication Instructions:  1) START Eliquis 5mg  twice daily.  If the cost of Eliquis is too high, please call [phone] and let them know you need assistance with cost.   2) START Amiodarone 200mg  twice daily  3) START Furosemide 20mg  once daily  4) DISCONTINUE Aspirin  *If you need a refill on your cardiac medications before your next appointment, please call your pharmacy*   Lab Work: BMET, CBC, TSH, Liver and Pro BNP today  If you have labs (blood work) drawn today and your tests are completely normal, you will receive your results only by: Marland Kitchen MyChart Message (if you have MyChart) OR . A paper copy in the mail If you have any lab test that is abnormal or we need to change your treatment, we will call you to review the results.   Testing/Procedures: Your physician has requested that you have an echocardiogram (would love to have before he sees Dr. Tamala Julian back if at all possible). Echocardiography is a painless test that uses sound waves to create images of your heart. It provides your doctor with information about the size and shape of your heart and how well your heart's chambers and valves are working. This procedure takes approximately one hour. There are no restrictions for this procedure.  Your physician recommends that you wear a monitor for 2 weeks.   Follow-Up: At [facility], you and your health needs are our priority.  As part of our continuing mission to provide you with exceptional heart care, we have created designated Provider Care Teams.  These Care Teams include your primary Cardiologist (physician) and Advanced Practice Providers (APPs -  Physician Assistants and Nurse Practitioners) who all work together to provide you with the care you need, when you need it.  We recommend signing up for the patient portal called "MyChart".  Sign up information is provided on this After Visit Summary.  MyChart is used to connect with patients for Virtual Visits (Telemedicine).   Patients are able to view lab/test results, encounter notes, upcoming appointments, etc.  Non-urgent messages can be sent to your provider as well.   To learn more about what you can do with MyChart, go to [email or web address].    Your next appointment:   2 week(s)- can use May 9th at 1040am  The format for your next appointment:   In Person  Provider:   You may see Sinclair Grooms, MD or one of the following Advanced Practice Providers on your designated Care Team:    Kathyrn Drown, NP    Other Instructions  [place] Monitor Instructions   Your physician has requested you wear a ZIO patch monitor for 14 days.  This is a single patch monitor.   [employer] supplies one patch monitor per enrollment. Additional stickers are not available. Please do not apply patch if you will be having a Nuclear Stress Test, Echocardiogram, Cardiac CT, MRI, or Chest Xray during the period you would be wearing the monitor. The patch cannot be worn during these tests. You cannot remove and re-apply the ZIO XT patch monitor.  Your ZIO patch monitor will be sent Fed Ex from [employer] directly to your home address. It may take 3-5 days to receive your monitor after you have been enrolled.  Once you have received your monitor, please review the enclosed instructions. Your monitor has already been registered assigning a specific monitor serial # to you.  Billing and Patient Assistance Program Information   We have supplied  [employer] with any of your insurance information on file for billing purposes. [employer] offers a sliding scale Patient Assistance Program for patients that do not have insurance, or whose insurance does not completely cover the cost of the ZIO monitor.   You must apply for the Patient Assistance Program to qualify for this discounted rate.     To apply, please call [employer] at [phone], select option 4, then select option 2, and ask to apply for Patient Assistance Program.   Theodore Demark will ask your household income, and how many people are in your household.  They will quote your out-of-pocket cost based on that information.  [employer] will also be able to set up a 10-month, interest-free payment plan if needed.  Applying the monitor   Shave hair from upper left chest.  Hold abrader disc by orange tab. Rub abrader in 40 strokes over the upper left chest as indicated in your monitor instructions.  Clean area with 4 enclosed alcohol pads. Let dry.  Apply patch as indicated in monitor instructions. Patch will be placed under collarbone on left side of chest with arrow pointing upward.  Rub patch adhesive wings for 2 minutes. Remove white label marked "1". Remove the white label marked "2". Rub patch adhesive wings for 2 additional minutes.  While looking in a mirror, press and release button in center of patch. A small green light will flash 3-4 times. This will be your only indicator that the monitor has been turned on. ?  Do not shower for the first 24 hours. You may shower after the first 24 hours.  Press the button if you feel a symptom. You will hear a small click. Record Date, Time and Symptom in the Patient Logbook.  When you are ready to remove the patch, follow instructions on the last 2 pages of the Patient Logbook. Stick patch monitor onto the last page of Patient Logbook.  Place Patient Logbook in the blue and white box.  Use locking tab on box and tape box closed securely.  The blue and white box has prepaid postage on it. Please place it in the mailbox as soon as possible. Your physician should have your test results approximately 7 days after the monitor has been mailed back to [facility].  Call [place] at [phone] if you have questions regarding your ZIO XT patch monitor. Call them immediately if you see an orange light blinking on your monitor.  If your monitor falls off in less than 4 days, contact our Monitor department at  [phone]. ?If your monitor becomes loose or falls off after 4 days call [employer] at [phone] for suggestions on securing your monitor.?

## 2020-04-24 LAB — CBC
Hematocrit: 35.1 % — ABNORMAL LOW (ref 37.5–51.0)
Hemoglobin: 10.6 g/dL — ABNORMAL LOW (ref 13.0–17.7)
MCH: 24.5 pg — ABNORMAL LOW (ref 26.6–33.0)
MCHC: 30.2 g/dL — ABNORMAL LOW (ref 31.5–35.7)
MCV: 81 fL (ref 79–97)
Platelets: 301 10*3/uL (ref 150–450)
RBC: 4.32 x10E6/uL (ref 4.14–5.80)
RDW: 14.8 % (ref 11.6–15.4)
WBC: 7.3 10*3/uL (ref 3.4–10.8)

## 2020-04-24 LAB — BASIC METABOLIC PANEL
BUN/Creatinine Ratio: 24 (ref 10–24)
BUN: 27 mg/dL (ref 8–27)
CO2: 23 mmol/L (ref 20–29)
Calcium: 9.4 mg/dL (ref 8.6–10.2)
Chloride: 98 mmol/L (ref 96–106)
Creatinine, Ser: 1.13 mg/dL (ref 0.76–1.27)
Glucose: 139 mg/dL — ABNORMAL HIGH (ref 65–99)
Potassium: 4.7 mmol/L (ref 3.5–5.2)
Sodium: 139 mmol/L (ref 134–144)
eGFR: 65 mL/min/{1.73_m2} (ref >59)

## 2020-04-24 LAB — HEPATIC FUNCTION PANEL
ALT: 8 IU/L (ref 0–44)
AST: 15 IU/L (ref 0–40)
Albumin: 4 g/dL (ref 3.6–4.6)
Alkaline Phosphatase: 65 IU/L (ref 44–121)
Bilirubin Total: 0.8 mg/dL (ref 0.0–1.2)
Bilirubin, Direct: 0.31 mg/dL (ref 0.00–0.40)
Total Protein: 6.4 g/dL (ref 6.0–8.5)

## 2020-04-24 LAB — TSH: TSH: 2.11 u[IU]/mL (ref 0.450–4.500)

## 2020-04-24 LAB — PRO B NATRIURETIC PEPTIDE: NT-Pro BNP: 2488 pg/mL — ABNORMAL HIGH (ref 0–486)

## 2020-04-28 ENCOUNTER — Other Ambulatory Visit: Payer: Self-pay

## 2020-04-28 ENCOUNTER — Ambulatory Visit: Payer: Medicare Other | Admitting: Podiatry

## 2020-04-28 ENCOUNTER — Encounter: Payer: Self-pay | Admitting: Podiatry

## 2020-04-28 DIAGNOSIS — M79675 Pain in left toe(s): Secondary | ICD-10-CM | POA: Diagnosis not present

## 2020-04-28 DIAGNOSIS — B351 Tinea unguium: Secondary | ICD-10-CM | POA: Diagnosis not present

## 2020-04-28 DIAGNOSIS — M2141 Flat foot [pes planus] (acquired), right foot: Secondary | ICD-10-CM

## 2020-04-28 DIAGNOSIS — E1142 Type 2 diabetes mellitus with diabetic polyneuropathy: Secondary | ICD-10-CM

## 2020-04-28 DIAGNOSIS — M2142 Flat foot [pes planus] (acquired), left foot: Secondary | ICD-10-CM

## 2020-04-28 DIAGNOSIS — Z7901 Long term (current) use of anticoagulants: Secondary | ICD-10-CM

## 2020-04-28 DIAGNOSIS — L600 Ingrowing nail: Secondary | ICD-10-CM

## 2020-04-28 DIAGNOSIS — M79674 Pain in right toe(s): Secondary | ICD-10-CM | POA: Diagnosis not present

## 2020-04-28 NOTE — Progress Notes (Signed)
  Subjective:  Patient ID: Wesley Winters, male    DOB: 08/29/1940,  MRN: [id number]  Chief Complaint  Patient presents with  . Nail Problem    Thick painful toenails, 9 week follow up    80 y.o. male presents with the above complaint. History confirmed with patient.  Reports no new issues other than recent diagnosis of A. fib and is now taking Eliquis.  His A1c is 7.5%.  Toenails are elongated and thickened and painful physically growing faster.  Hallux nails particular painful and incurvated edges and corners  Objective:  Physical Exam: warm, good capillary refill, no trophic changes or ulcerative lesions, normal DP and PT pulses, light touch sensation intact.  Thickened elongated nail plates 1 through 5 bilateral with nail dystrophy and subungual debris, pes planus deformity Assessment:  No diagnosis found.   Plan:  Patient was evaluated and treated and all questions answered.   Patient educated on diabetes. Discussed proper diabetic foot care and discussed risks and complications of disease. Educated patient in depth on reasons to return to the office immediately should he/she discover anything concerning or new on the feet. All questions answered. Discussed proper shoes as well.   Discussed the etiology and treatment options for the condition in detail with the patient. Educated patient on the topical and oral treatment options for mycotic nails. Recommended debridement of the nails today. Sharp and mechanical debridement performed of all painful and mycotic nails today. Nails debrided in length and thickness using a nail nipper to level of comfort. Discussed treatment options including appropriate shoe gear. Follow up as needed for painful nails.    Return in about 9 weeks (around 06/30/2020) for nail trim.

## 2020-05-05 ENCOUNTER — Other Ambulatory Visit: Payer: Self-pay

## 2020-05-05 ENCOUNTER — Ambulatory Visit (HOSPITAL_COMMUNITY): Payer: Medicare Other | Attending: Cardiovascular Disease

## 2020-05-05 DIAGNOSIS — I4891 Unspecified atrial fibrillation: Secondary | ICD-10-CM

## 2020-05-05 LAB — ECHOCARDIOGRAM COMPLETE
Area-P 1/2: 4.05 cm2
MV M vel: 3.77 m/s
MV Peak grad: 56.9 mmHg
Radius: 0.53 cm
S' Lateral: 5.2 cm

## 2020-05-05 MED ORDER — PERFLUTREN LIPID MICROSPHERE
1.0000 mL | INTRAVENOUS | Status: AC | PRN
  Administered 2020-05-05: 1 mL via INTRAVENOUS

## 2020-05-06 DIAGNOSIS — E1149 Type 2 diabetes mellitus with other diabetic neurological complication: Secondary | ICD-10-CM | POA: Diagnosis not present

## 2020-05-06 DIAGNOSIS — E1142 Type 2 diabetes mellitus with diabetic polyneuropathy: Secondary | ICD-10-CM | POA: Diagnosis not present

## 2020-05-06 DIAGNOSIS — E782 Mixed hyperlipidemia: Secondary | ICD-10-CM | POA: Diagnosis not present

## 2020-05-06 DIAGNOSIS — I10 Essential (primary) hypertension: Secondary | ICD-10-CM | POA: Diagnosis not present

## 2020-05-11 NOTE — Progress Notes (Addendum)
Cardiology Office Note:    Date:  05/12/2020   ID:  Wesley Winters, DOB 09/25/1940, MRN [id number]  PCP:  [name], MD  Cardiologist:  Sinclair Grooms, MD   Referring MD: [name], MD   Chief Complaint  Patient presents with  . Congestive Heart Failure    History of Present Illness:    Wesley Winters is a 80 y.o. male with a hx of primary hypertension, hyperlipidemia, OSA, CAD with stent to LAD and RCA proximal and mid stents in 1998 and 1999 then cardiac cath in 2005 with patent coronary arteries. TL57 to 50% 2620 --> new systolic dysfunction EF 35% in setting of AF of unknown duration..  He has bilateral lower extremity edema.  No change in his breathing.  Overall feels about the same as on last visit.  Has not yet had furosemide today.  He denies orthopnea and PND.  He has not had syncope.  He denies chest pain.  Past Medical History:  Diagnosis Date  . Balanitis   . Bladder calculus   . Coronary artery disease    stents x3 by Dr Tamala Julian 1998  . Depression   . Diabetes mellitus without complication ([place])   . GERD (gastroesophageal reflux disease)   . Hypercholesterolemia   . Hypertension   . Phimosis   . Prostate cancer ([place])   . Sleep apnea    wears CPAP nightly    Past Surgical History:  Procedure Laterality Date  . BIOPSY  07/10/2019   Procedure: BIOPSY;  Surgeon: Otis Brace, MD;  Location: [facility];  Service: Gastroenterology;;  . [place] Meek    . CYSTOSCOPY    . ESOPHAGOGASTRODUODENOSCOPY (EGD) WITH PROPOFOL N/A 07/10/2019   Procedure: ESOPHAGOGASTRODUODENOSCOPY (EGD) WITH PROPOFOL;  Surgeon: Otis Brace, MD;  Location: [facility];  Service: Gastroenterology;  Laterality: N/A;  . HERNIA REPAIR     umbilical  . PROSTATE BIOPSY    . PROSTATE BIOPSY    . PROSTATE BIOPSY    . three cardiac stents      Current Medications: Current Meds  Medication Sig  . acetaminophen (TYLENOL) 500 MG tablet Take 500-1,000 mg by mouth 3 (three)  times daily as needed for moderate pain.  Marland Kitchen amiodarone (PACERONE) 200 MG tablet Take 1 tablet (200 mg total) by mouth 2 (two) times daily.  Marland Kitchen amLODipine (NORVASC) 10 MG tablet Take 10 mg by mouth daily.   Marland Kitchen apixaban (ELIQUIS) 5 MG TABS tablet Take 1 tablet (5 mg total) by mouth 2 (two) times daily.  Marland Kitchen atorvastatin (LIPITOR) 40 MG tablet Take 40 mg by mouth daily.  Marland Kitchen augmented betamethasone dipropionate (DIPROLENE-AF) 0.05 % cream Apply topically.  Marland Kitchen azelastine (ASTELIN) 0.1 % nasal spray Place 2 sprays into both nostrils 2 (two) times daily as needed for allergies. Use in each nostril as directed  . B-D UF III MINI PEN NEEDLES 31G X 5 MM MISC SMARTSIG:1 SUB-Q Every Night  . benazepril (LOTENSIN) 20 MG tablet Take 20 mg by mouth daily.  . betamethasone dipropionate 0.05 % cream Apply 1 application topically 2 (two) times daily.  . cetirizine (ZYRTEC) 10 MG tablet 1 tablet  . cholecalciferol (VITAMIN D3) 25 MCG (1000 UT) tablet Take 1,000 Units by mouth every evening.   . Cyanocobalamin 1000 MCG/15ML LIQD 1 tablet  . fluticasone (FLONASE) 50 MCG/ACT nasal spray Place 2 sprays into both nostrils daily as needed for allergies.   . furosemide (LASIX) 20 MG tablet Take 1 tablet (20 mg total)  by mouth daily.  . hydrochlorothiazide (HYDRODIURIL) 25 MG tablet Take 25 mg by mouth daily.  . Insulin Glargine 300 UNIT/ML SOPN Inject 25 Units into the skin every evening.   . metFORMIN (GLUCOPHAGE-XR) 500 MG 24 hr tablet Take 1,000 mg by mouth 2 (two) times daily.   . mupirocin ointment (BACTROBAN) 2 % APPLY TO RIGHT GREAT TOE AND RIGHT 3RD TOE ONCE DAILY  . nebivolol (BYSTOLIC) 10 MG tablet Take 10 mg by mouth daily. Per patient taking 1/2 tablet  . ONETOUCH ULTRA test strip 1 each 3 (three) times daily.  . pantoprazole (PROTONIX) 20 MG tablet Take 20 mg by mouth daily.  . polyethylene glycol (MIRALAX / GLYCOLAX) 17 g packet 1 packet mixed with 8 ounces of fluid  . potassium chloride (K-DUR,KLOR-CON) 10 MEQ  tablet Take 10 mEq by mouth 2 (two) times daily.   Marland Kitchen Respiratory Therapy Supplies (CARETOUCH 2 CPAP HOSE HANGER) MISC 11cm  . sucralfate (CARAFATE) 1 g tablet Take 1 g by mouth in the morning and at bedtime.  Nelva Nay SOLOSTAR 300 UNIT/ML Solostar Pen SMARTSIG:25 Unit(s) SUB-Q Every Evening  . triamcinolone cream (KENALOG) 0.1 % Apply 1 application topically 2 (two) times daily as needed (rash).   . vitamin B-12 (CYANOCOBALAMIN) 1000 MCG tablet Take 1,000 mcg by mouth daily.      Allergies:   Amoxicillin, Amoxicillin-pot clavulanate, Empagliflozin, and Metformin hcl   Social History   Socioeconomic History  . Marital status: Married    Spouse name: Not on file  . Number of children: Not on file  . Years of education: Not on file  . Highest education level: Not on file  Occupational History  . Not on file  Tobacco Use  . Smoking status: Never Smoker  . Smokeless tobacco: Never Used  Substance and Sexual Activity  . Alcohol use: No  . Drug use: No  . Sexual activity: Yes  Other Topics Concern  . Not on file  Social History Narrative  . Not on file   Social Determinants of Health   Financial Resource Strain: Not on file  Food Insecurity: Not on file  Transportation Needs: Not on file  Physical Activity: Not on file  Stress: Not on file  Social Connections: Not on file     Family History: The patient's family history includes Arrhythmia in his brother and mother; Cancer in his brother; Heart disease in his brother and mother.  ROS:   Please see the history of present illness.    Has abdominal discomfort.  This is been going on for months.  All other systems reviewed and are negative.  EKGs/Labs/Other Studies Reviewed:    The following studies were reviewed today:   2D Doppler ECHOCARDIOGRAM 05/05/2020: IMPRESSIONS    1. Left ventricular ejection fraction, by estimation, is 20 to 25%. The  left ventricle has severely decreased function. The left ventricle   demonstrates global hypokinesis. The left ventricular internal cavity size  was moderately dilated. Left  ventricular diastolic parameters are indeterminate. Elevated left  ventricular end-diastolic pressure.  2. Right ventricular systolic function is moderately reduced. The right  ventricular size is normal. There is mildly elevated pulmonary artery  systolic pressure.  3. Left atrial size was severely dilated.  4. Right atrial size was mildly dilated.  5. The mitral valve is normal in structure. Mild mitral valve  regurgitation. No evidence of mitral stenosis.  6. The aortic valve is normal in structure. Aortic valve regurgitation is  not visualized. No  aortic stenosis is present.  7. The inferior vena cava is normal in size with greater than 50%  respiratory variability, suggesting right atrial pressure of 3 mmHg.   EKG:  EKG EKG not done today  Recent Labs: 04/23/2020: ALT 8; BUN 27; Creatinine, Ser 1.13; Hemoglobin 10.6; NT-Pro BNP 2,488; Platelets 301; Potassium 4.7; Sodium 139; TSH 2.110  Recent Lipid Panel No results found for: CHOL, TRIG, HDL, CHOLHDL, VLDL, LDLCALC, LDLDIRECT  Physical Exam:    VS:  BP 98/62   Pulse (!) 55   Ht 5\' 8"  (1.727 m)   Wt 228 lb (103.4 kg)   SpO2 95%   BMI 34.67 kg/m     Wt Readings from Last 3 Encounters:  05/12/20 228 lb (103.4 kg)  04/23/20 223 lb 3.2 oz (101.2 kg)  07/11/19 216 lb (98 kg)     GEN: Obese with large abdomen. No acute distress HEENT: Normal NECK: No JVD. LYMPHATICS: No lymphadenopathy CARDIAC: No murmur. IIRR no gallop, but with 2+ bilateral ankle to mid shin edema. VASCULAR:  Normal Pulses. No bruits. RESPIRATORY:  Clear to auscultation without rales, wheezing or rhonchi  ABDOMEN: Soft, non-tender, non-distended, No pulsatile mass, MUSCULOSKELETAL: No deformity  SKIN: Warm and dry NEUROLOGIC:  Alert and oriented x 3 PSYCHIATRIC:  Normal affect   ASSESSMENT:    1. 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 Atrial fibrillation, unspecified type ([place])   3. Hyperlipidemia LDL goal <70   4. Type 2 diabetes mellitus with other circulatory complication, without long-term current use of insulin ([place])   5. Essential hypertension   6. Anticoagulation adequate    PLAN:    In order of problems listed above:  1. Discontinue nebivolol and start carvedilol 3.125 mg p.o. twice daily.  Continue benazepril.  Further optimization of heart failure therapy once rhythm is controlled.  Discontinue HCTZ.  Discontinue nebivolol. 2. Carvedilol plus amiodarone for rate control.  Follow-up in 2 weeks with EKG.  Arrange outpatient cardioversion if still in A. fib by that time. 3. Did not discuss lipids today. 4. Did not discuss diabetes today. 5. Relatively low blood pressure.  Will discontinue HCTZ, and amlodipine which may also be contributing to edema.\\ 6. Continue apixaban.  On return, determine rhythm, consider adding MRA to furosemide, increase furosemide dose prior to then if swelling in lower extremities does not resolve with discontinuation of amlodipine.  May need to add SGLT2 and switch benazepril to [place].  Bmet today and in 2 weeks upon return.  We decided to discontinue HCTZ, potassium came back 3.4.  Will increase K. Dur to 40 mEq each of the next 2 days then go back to 20 mEq/day.  Bmet will be obtained on the next office visit.   Medication Adjustments/Labs and Tests Ordered: Current medicines are reviewed at length with the patient today.  Concerns regarding medicines are outlined above.  No orders of the defined types were placed in this encounter.  No orders of the defined types were placed in this encounter.   There are no Patient Instructions on file for this visit.   Signed, Sinclair Grooms, MD  05/12/2020 11:03 AM    [place]

## 2020-05-12 ENCOUNTER — Ambulatory Visit: Payer: Medicare Other | Admitting: Interventional Cardiology

## 2020-05-12 ENCOUNTER — Encounter: Payer: Self-pay | Admitting: Interventional Cardiology

## 2020-05-12 ENCOUNTER — Other Ambulatory Visit: Payer: Self-pay

## 2020-05-12 VITALS — BP 98/62 | HR 55 | Ht 68.0 in | Wt 228.0 lb

## 2020-05-12 DIAGNOSIS — I5023 Acute on chronic systolic (congestive) heart failure: Secondary | ICD-10-CM | POA: Diagnosis not present

## 2020-05-12 DIAGNOSIS — I4891 Unspecified atrial fibrillation: Secondary | ICD-10-CM

## 2020-05-12 DIAGNOSIS — E785 Hyperlipidemia, unspecified: Secondary | ICD-10-CM

## 2020-05-12 DIAGNOSIS — Z7901 Long term (current) use of anticoagulants: Secondary | ICD-10-CM

## 2020-05-12 DIAGNOSIS — E1159 Type 2 diabetes mellitus with other circulatory complications: Secondary | ICD-10-CM

## 2020-05-12 DIAGNOSIS — I10 Essential (primary) hypertension: Secondary | ICD-10-CM

## 2020-05-12 LAB — BASIC METABOLIC PANEL
BUN/Creatinine Ratio: 25 — ABNORMAL HIGH (ref 10–24)
BUN: 28 mg/dL — ABNORMAL HIGH (ref 8–27)
CO2: 24 mmol/L (ref 20–29)
Calcium: 8.6 mg/dL (ref 8.6–10.2)
Chloride: 98 mmol/L (ref 96–106)
Creatinine, Ser: 1.13 mg/dL (ref 0.76–1.27)
Glucose: 119 mg/dL — ABNORMAL HIGH (ref 65–99)
Potassium: 3.4 mmol/L — ABNORMAL LOW (ref 3.5–5.2)
Sodium: 139 mmol/L (ref 134–144)
eGFR: 65 mL/min/{1.73_m2} (ref >59)

## 2020-05-12 MED ORDER — CARVEDILOL 3.125 MG PO TABS: 3.1250 mg | ORAL_TABLET | Freq: Two times a day (BID) | ORAL | 3 refills | Status: DC

## 2020-05-12 NOTE — Patient Instructions (Signed)
Medication Instructions:  STOP Amlodipine STOP HCTZ STOP Nebivolol START Carvedilol 3.125mg  twice a day. *If you need a refill on your cardiac medications before your next appointment, please call your pharmacy*   Lab Work: BMET today If you have labs (blood work) drawn today and your tests are completely normal, you will receive your results only by: Marland Kitchen MyChart Message (if you have MyChart) OR . A paper copy in the mail If you have any lab test that is abnormal or we need to change your treatment, we will call you to review the results.   Testing/Procedures: None   Follow-Up: At [facility], you and your health needs are our priority.  As part of our continuing mission to provide you with exceptional heart care, we have created designated Provider Care Teams.  These Care Teams include your primary Cardiologist (physician) and Advanced Practice Providers (APPs -  Physician Assistants and Nurse Practitioners) who all work together to provide you with the care you need, when you need it.  We recommend signing up for the patient portal called "MyChart".  Sign up information is provided on this After Visit Summary.  MyChart is used to connect with patients for Virtual Visits (Telemedicine).  Patients are able to view lab/test results, encounter notes, upcoming appointments, etc.  Non-urgent messages can be sent to your provider as well.   To learn more about what you can do with MyChart, go to [email or web address].    Your next appointment:   2 week(s)  The format for your next appointment:   In Person  Provider:   You may see Sinclair Grooms, MD or one of the following Advanced Practice Providers on your designated Care Team:    Kathyrn Drown, NP    Other Instructions

## 2020-05-13 DIAGNOSIS — G4733 Obstructive sleep apnea (adult) (pediatric): Secondary | ICD-10-CM | POA: Diagnosis not present

## 2020-05-23 ENCOUNTER — Telehealth: Payer: Self-pay | Admitting: Interventional Cardiology

## 2020-05-23 DIAGNOSIS — I4891 Unspecified atrial fibrillation: Secondary | ICD-10-CM | POA: Diagnosis not present

## 2020-05-23 NOTE — Telephone Encounter (Signed)
New message    Patient has an appt on Monday.  He states that he has a "head" cold.  He has a rapid COVID test yesterday at [facility] and it was neg.  Calling to see if it is ok to keep his appt on Monday or should he reschedule?

## 2020-05-23 NOTE — Telephone Encounter (Signed)
Returned call to patient and patient advised to come to his appointment on Monday. Patient states his rapid Covid test was negative and patient doesn't have a fever.  Patient advised to come to his appointment and patient appreciated the call.

## 2020-05-25 NOTE — Progress Notes (Addendum)
Cardiology Office Visit   Date:  05/26/2020   ID:  Wesley Winters, Wesley Winters 03/30/1940, MRN [id number]  PCP:  [name], MD  Cardiologist:  Sinclair Grooms, MD   Referring MD: [name], MD   Chief Complaint  Patient presents with  . Atrial Fibrillation  . Congestive Heart Failure    History of Present Illness:    Wesley Winters is a 80 y.o. male with a hx of primary hypertension, hyperlipidemia, OSA,CAD (stent to LAD and RCA in 1998 and 1999; patent coronary arteries 9326), acute systolic HF 71%, and AF unknown duration 04/23/2020.   Wesley Winters has recently identified new acute systolic heart failure of unknown duration.  Most current EF of 25% compared with prior greater than 50% EF.  Also has new atrial fibrillation first identified April 23, 2020.  We have embarked on a management strategy that was to include initial rate control, diuresis, anticoagulation for 3 to 4 weeks, and eventually electrical cardioversion.  Had previously been on combination of HCTZ and furosemide.  HCTZ was dropped.  Furosemide intensity was not increased because of relatively low blood pressures.  He returns today having continued orthopnea, cough, dyspnea on exertion, and 10 pound weight gain.  He is not having a good diuretic effect on low-dose furosemide, 20 mg/day.  He denies angina.  He denies PND.  He has persistent lower extremity swelling.  Abdominal girth appears to be increasing.  He wears CPAP.  He sleeps with his bed elevated at approximately 30 degrees.  He initially opposed to hospitalization.  He has not gotten any better over the past month and is now willing to consider hospitalization for more rapid improvement in clinical status.  Past Medical History:  Diagnosis Date  . Balanitis   . Bladder calculus   . Coronary artery disease    stents x3 by Dr Tamala Julian 1998  . Depression   . Diabetes mellitus without complication ([place])   . GERD (gastroesophageal reflux disease)   .  Hypercholesterolemia   . Hypertension   . Phimosis   . Prostate cancer ([place])   . Sleep apnea    wears CPAP nightly    Past Surgical History:  Procedure Laterality Date  . BIOPSY  07/10/2019   Procedure: BIOPSY;  Surgeon: Otis Brace, MD;  Location: [facility];  Service: Gastroenterology;;  . [place] Meek    . CYSTOSCOPY    . ESOPHAGOGASTRODUODENOSCOPY (EGD) WITH PROPOFOL N/A 07/10/2019   Procedure: ESOPHAGOGASTRODUODENOSCOPY (EGD) WITH PROPOFOL;  Surgeon: Otis Brace, MD;  Location: [facility];  Service: Gastroenterology;  Laterality: N/A;  . HERNIA REPAIR     umbilical  . PROSTATE BIOPSY    . PROSTATE BIOPSY    . PROSTATE BIOPSY    . three cardiac stents      Current Medications: Current Meds  Medication Sig  . acetaminophen (TYLENOL) 500 MG tablet Take 500-1,000 mg by mouth 3 (three) times daily as needed for moderate pain.  Marland Kitchen amiodarone (PACERONE) 200 MG tablet Take 1 tablet (200 mg total) by mouth 2 (two) times daily.  Marland Kitchen apixaban (ELIQUIS) 5 MG TABS tablet Take 1 tablet (5 mg total) by mouth 2 (two) times daily.  Marland Kitchen atorvastatin (LIPITOR) 40 MG tablet Take 40 mg by mouth daily.  Marland Kitchen augmented betamethasone dipropionate (DIPROLENE-AF) 0.05 % cream Apply topically.  Marland Kitchen azelastine (ASTELIN) 0.1 % nasal spray Place 2 sprays into both nostrils 2 (two) times daily as needed for allergies. Use in each nostril as directed  .  B-D UF III MINI PEN NEEDLES 31G X 5 MM MISC SMARTSIG:1 SUB-Q Every Night  . benazepril (LOTENSIN) 20 MG tablet Take 20 mg by mouth daily.  . betamethasone dipropionate 0.05 % cream Apply 1 application topically 2 (two) times daily.  . carvedilol (COREG) 3.125 MG tablet Take 1 tablet (3.125 mg total) by mouth 2 (two) times daily with a meal.  . cetirizine (ZYRTEC) 10 MG tablet 1 tablet  . cholecalciferol (VITAMIN D3) 25 MCG (1000 UT) tablet Take 1,000 Units by mouth every evening.   . Cyanocobalamin 1000 MCG/15ML LIQD 1 tablet  . fluticasone (FLONASE)  50 MCG/ACT nasal spray Place 2 sprays into both nostrils daily as needed for allergies.   . furosemide (LASIX) 20 MG tablet Take 1 tablet (20 mg total) by mouth daily.  . Insulin Glargine 300 UNIT/ML SOPN Inject 25 Units into the skin every evening.   . metFORMIN (GLUCOPHAGE-XR) 500 MG 24 hr tablet Take 1,000 mg by mouth 2 (two) times daily.   . mupirocin ointment (BACTROBAN) 2 % APPLY TO RIGHT GREAT TOE AND RIGHT 3RD TOE ONCE DAILY  . ONETOUCH ULTRA test strip 1 each 3 (three) times daily.  . pantoprazole (PROTONIX) 20 MG tablet Take 20 mg by mouth daily.  . polyethylene glycol (MIRALAX / GLYCOLAX) 17 g packet 1 packet mixed with 8 ounces of fluid  . potassium chloride (K-DUR,KLOR-CON) 10 MEQ tablet Take 10 mEq by mouth 2 (two) times daily.   Marland Kitchen Respiratory Therapy Supplies (CARETOUCH 2 CPAP HOSE HANGER) MISC 11cm  . sucralfate (CARAFATE) 1 g tablet Take 1 g by mouth in the morning and at bedtime.  Nelva Nay SOLOSTAR 300 UNIT/ML Solostar Pen SMARTSIG:25 Unit(s) SUB-Q Every Evening  . triamcinolone cream (KENALOG) 0.1 % Apply 1 application topically 2 (two) times daily as needed (rash).   . vitamin B-12 (CYANOCOBALAMIN) 1000 MCG tablet Take 1,000 mcg by mouth daily.      Allergies:   Amoxicillin, Amoxicillin-pot clavulanate, Empagliflozin, and Metformin hcl   Social History   Socioeconomic History  . Marital status: Married    Spouse name: Not on file  . Number of children: Not on file  . Years of education: Not on file  . Highest education level: Not on file  Occupational History  . Not on file  Tobacco Use  . Smoking status: Never Smoker  . Smokeless tobacco: Never Used  Substance and Sexual Activity  . Alcohol use: No  . Drug use: No  . Sexual activity: Yes  Other Topics Concern  . Not on file  Social History Narrative  . Not on file   Social Determinants of Health   Financial Resource Strain: Not on file  Food Insecurity: Not on file  Transportation Needs: Not on file   Physical Activity: Not on file  Stress: Not on file  Social Connections: Not on file     Family History: The patient's family history includes Arrhythmia in his brother and mother; Cancer in his brother; Heart disease in his brother and mother.  ROS:   Please see the history of present illness.    Not making much urine.  Bringing up yellow phlegm.  Appetite is decreasing.  All other systems reviewed and are negative.  EKGs/Labs/Other Studies Reviewed:    The following studies were reviewed today:  ECHOCARDIOGRAM 2022: IMPRESSIONS  1. Left ventricular ejection fraction, by estimation, is 20 to 25%. The  left ventricle has severely decreased function. The left ventricle  demonstrates global hypokinesis. The  left ventricular internal cavity size  was moderately dilated. Left  ventricular diastolic parameters are indeterminate. Elevated left  ventricular end-diastolic pressure.  2. Right ventricular systolic function is moderately reduced. The right  ventricular size is normal. There is mildly elevated pulmonary artery  systolic pressure.  3. Left atrial size was severely dilated.  4. Right atrial size was mildly dilated.  5. The mitral valve is normal in structure. Mild mitral valve  regurgitation. No evidence of mitral stenosis.  6. The aortic valve is normal in structure. Aortic valve regurgitation is  not visualized. No aortic stenosis is present.  7. The inferior vena cava is normal in size with greater than 50%  respiratory variability, suggesting right atrial pressure of 3 mmHg.   EKG:  EKG atrial fibrillation with moderate rate control, left bundle branch block, and when compared to the prior tracing performed on 04/23/2020, the heart rate is improved.  Recent Labs: 04/23/2020: ALT 8; Hemoglobin 10.6; NT-Pro BNP 2,488; Platelets 301; TSH 2.110 05/12/2020: BUN 28; Creatinine, Ser 1.13; Potassium 3.4; Sodium 139  Recent Lipid Panel No results found for: CHOL, TRIG,  HDL, CHOLHDL, VLDL, LDLCALC, LDLDIRECT  Physical Exam:    VS:  BP 104/62   Pulse (!) 101   Ht 5\' 8"  (1.727 m)   Wt 233 lb (105.7 kg)   SpO2 99%   BMI 35.43 kg/m     Wt Readings from Last 3 Encounters:  05/26/20 233 lb (105.7 kg)  05/12/20 228 lb (103.4 kg)  04/23/20 223 lb 3.2 oz (101.2 kg)     GEN: Elderly, obese,. No acute distress HEENT: Conjunctival erythema. NECK: CV wave noted in the mid anterior cervical region with the patient sitting upright. LYMPHATICS: No lymphadenopathy CARDIAC: Soft apical systolic murmur.  Irregularly irregular without obvious gallop, but with 2+ bilateral ankle to shin edema. VASCULAR: Difficult to palpate pedal pulses. No bruits. RESPIRATORY:  Clear to auscultation with basilar rales but no wheezing. ABDOMEN: Soft, non-tender, non-distended, No pulsatile mass, MUSCULOSKELETAL: No deformity  SKIN: Warm and dry NEUROLOGIC:  Alert and oriented x 3 PSYCHIATRIC:  Normal affect   ASSESSMENT:    1. Atrial fibrillation, unspecified type ([place])   2. Acute on chronic systolic HF (heart failure) ([employer])   3. Essential hypertension   4. Hyperlipidemia LDL goal <70   5. Type 2 diabetes mellitus with other circulatory complication, without long-term current use of insulin ([place])   6. Anticoagulation adequate   7. CAD in native artery    PLAN:    In order of problems listed above:  1. Better rate control than previously on amiodarone and low-dose carvedilol.  Will plan to admit to hospital, consider electrical cardioversion during the admission.  He has been continuously anticoagulated since April 24, 2020. 2. IV diuresis will be started with 40 mg of furosemide every 12 hours and then adjust dose based upon blood pressure and urine output. 3. Blood pressures run relatively low as an outpatient.  For the time being we will hold Lotensin/benazepril. 4. Continue Lipitor 40 mg/day. 5. Consider starting SGLT2.  He has been tried on empagliflozin but it  caused nausea and jittery feeling. 6. Continue Eliquis 5 mg twice daily.  May need adjustment based upon kidney function.  Has been on continuous therapy for 4 weeks. 7. No symptoms to suggest angina currently.  Planning admission today for IV Lasix, hold losartan, check electrolytes, get PA lateral chest x-ray, consider cardioversion later this week with continued diuresis to get patient back  to a more stable state.  SGLT2 therapy has been tried but is worth reconsideration.  Also MRA could be added.  Reinstitute ACE/ARB after diuresis as kidneys allow.   Medication Adjustments/Labs and Tests Ordered: Current medicines are reviewed at length with the patient today.  Concerns regarding medicines are outlined above.  Orders Placed This Encounter  Procedures  . EKG 12-Lead   No orders of the defined types were placed in this encounter.   Patient Instructions  Medication Instructions:  Your physician recommends that you continue on your current medications as directed. Please refer to the Current Medication list given to you today.  *If you need a refill on your cardiac medications before your next appointment, please call your pharmacy*   Lab Work: None If you have labs (blood work) drawn today and your tests are completely normal, you will receive your results only by: Marland Kitchen MyChart Message (if you have MyChart) OR . A paper copy in the mail If you have any lab test that is abnormal or we need to change your treatment, we will call you to review the results.   Testing/Procedures: None   Follow-Up: Will be based on hospital stay   Other Instructions  You are being admitted to [facility].  They will call you when there is a bed available.      Signed, Sinclair Grooms, MD  05/26/2020 12:39 PM    [facility]

## 2020-05-26 ENCOUNTER — Inpatient Hospital Stay (HOSPITAL_COMMUNITY)
Admission: AD | Admit: 2020-05-26 | Discharge: 2020-06-03 | DRG: 291 | Disposition: A | Discharge status: Home / Self Care | Payer: Medicare Other | Source: Ambulatory Visit | Attending: Interventional Cardiology | Admitting: Interventional Cardiology

## 2020-05-26 ENCOUNTER — Ambulatory Visit: Payer: Medicare Other | Admitting: Interventional Cardiology

## 2020-05-26 ENCOUNTER — Encounter: Payer: Self-pay | Admitting: Interventional Cardiology

## 2020-05-26 ENCOUNTER — Other Ambulatory Visit: Payer: Self-pay

## 2020-05-26 VITALS — BP 104/62 | HR 101 | Ht 68.0 in | Wt 233.0 lb

## 2020-05-26 DIAGNOSIS — G4733 Obstructive sleep apnea (adult) (pediatric): Secondary | ICD-10-CM | POA: Diagnosis present

## 2020-05-26 DIAGNOSIS — K219 Gastro-esophageal reflux disease without esophagitis: Secondary | ICD-10-CM | POA: Diagnosis present

## 2020-05-26 DIAGNOSIS — N179 Acute kidney failure, unspecified: Secondary | ICD-10-CM

## 2020-05-26 DIAGNOSIS — I5042 Chronic combined systolic (congestive) and diastolic (congestive) heart failure: Secondary | ICD-10-CM

## 2020-05-26 DIAGNOSIS — Z7901 Long term (current) use of anticoagulants: Secondary | ICD-10-CM

## 2020-05-26 DIAGNOSIS — E11649 Type 2 diabetes mellitus with hypoglycemia without coma: Secondary | ICD-10-CM | POA: Diagnosis present

## 2020-05-26 DIAGNOSIS — N182 Chronic kidney disease, stage 2 (mild): Secondary | ICD-10-CM | POA: Diagnosis present

## 2020-05-26 DIAGNOSIS — I429 Cardiomyopathy, unspecified: Secondary | ICD-10-CM | POA: Diagnosis present

## 2020-05-26 DIAGNOSIS — Z7984 Long term (current) use of oral hypoglycemic drugs: Secondary | ICD-10-CM

## 2020-05-26 DIAGNOSIS — I5023 Acute on chronic systolic (congestive) heart failure: Secondary | ICD-10-CM | POA: Diagnosis present

## 2020-05-26 DIAGNOSIS — Z888 Allergy status to other drugs, medicaments and biological substances status: Secondary | ICD-10-CM | POA: Diagnosis not present

## 2020-05-26 DIAGNOSIS — J40 Bronchitis, not specified as acute or chronic: Secondary | ICD-10-CM | POA: Diagnosis present

## 2020-05-26 DIAGNOSIS — I4891 Unspecified atrial fibrillation: Secondary | ICD-10-CM | POA: Diagnosis not present

## 2020-05-26 DIAGNOSIS — I10 Essential (primary) hypertension: Secondary | ICD-10-CM

## 2020-05-26 DIAGNOSIS — Z20822 Contact with and (suspected) exposure to covid-19: Secondary | ICD-10-CM | POA: Diagnosis present

## 2020-05-26 DIAGNOSIS — E7800 Pure hypercholesterolemia, unspecified: Secondary | ICD-10-CM | POA: Diagnosis present

## 2020-05-26 DIAGNOSIS — Z79899 Other long term (current) drug therapy: Secondary | ICD-10-CM | POA: Diagnosis not present

## 2020-05-26 DIAGNOSIS — E0810 Diabetes mellitus due to underlying condition with ketoacidosis without coma: Secondary | ICD-10-CM | POA: Diagnosis not present

## 2020-05-26 DIAGNOSIS — I50811 Acute right heart failure: Secondary | ICD-10-CM

## 2020-05-26 DIAGNOSIS — E785 Hyperlipidemia, unspecified: Secondary | ICD-10-CM | POA: Diagnosis present

## 2020-05-26 DIAGNOSIS — I472 Ventricular tachycardia: Secondary | ICD-10-CM | POA: Diagnosis present

## 2020-05-26 DIAGNOSIS — I493 Ventricular premature depolarization: Secondary | ICD-10-CM | POA: Diagnosis present

## 2020-05-26 DIAGNOSIS — I5021 Acute systolic (congestive) heart failure: Secondary | ICD-10-CM | POA: Diagnosis present

## 2020-05-26 DIAGNOSIS — E1159 Type 2 diabetes mellitus with other circulatory complications: Secondary | ICD-10-CM

## 2020-05-26 DIAGNOSIS — Z880 Allergy status to penicillin: Secondary | ICD-10-CM | POA: Diagnosis not present

## 2020-05-26 DIAGNOSIS — Z91048 Other nonmedicinal substance allergy status: Secondary | ICD-10-CM

## 2020-05-26 DIAGNOSIS — I130 Hypertensive heart and chronic kidney disease with heart failure and stage 1 through stage 4 chronic kidney disease, or unspecified chronic kidney disease: Secondary | ICD-10-CM | POA: Diagnosis present

## 2020-05-26 DIAGNOSIS — Z794 Long term (current) use of insulin: Secondary | ICD-10-CM

## 2020-05-26 DIAGNOSIS — Z8249 Family history of ischemic heart disease and other diseases of the circulatory system: Secondary | ICD-10-CM

## 2020-05-26 DIAGNOSIS — I4819 Other persistent atrial fibrillation: Secondary | ICD-10-CM | POA: Diagnosis present

## 2020-05-26 DIAGNOSIS — F32A Depression, unspecified: Secondary | ICD-10-CM | POA: Diagnosis present

## 2020-05-26 DIAGNOSIS — I2510 Atherosclerotic heart disease of native coronary artery without angina pectoris: Secondary | ICD-10-CM | POA: Diagnosis present

## 2020-05-26 DIAGNOSIS — E119 Type 2 diabetes mellitus without complications: Secondary | ICD-10-CM

## 2020-05-26 DIAGNOSIS — Z955 Presence of coronary angioplasty implant and graft: Secondary | ICD-10-CM

## 2020-05-26 DIAGNOSIS — I509 Heart failure, unspecified: Secondary | ICD-10-CM

## 2020-05-26 DIAGNOSIS — Z8546 Personal history of malignant neoplasm of prostate: Secondary | ICD-10-CM

## 2020-05-26 DIAGNOSIS — E1122 Type 2 diabetes mellitus with diabetic chronic kidney disease: Secondary | ICD-10-CM | POA: Diagnosis present

## 2020-05-26 LAB — CBC WITH DIFFERENTIAL/PLATELET
Abs Immature Granulocytes: 0.03 10*3/uL (ref 0.00–0.07)
Basophils Absolute: 0 10*3/uL (ref 0.0–0.1)
Basophils Relative: 0 %
Eosinophils Absolute: 0 10*3/uL (ref 0.0–0.5)
Eosinophils Relative: 0 %
HCT: 35.1 % — ABNORMAL LOW (ref 39.0–52.0)
Hemoglobin: 10.5 g/dL — ABNORMAL LOW (ref 13.0–17.0)
Immature Granulocytes: 0 %
Lymphocytes Relative: 15 %
Lymphs Abs: 1 10*3/uL (ref 0.7–4.0)
MCH: 24.4 pg — ABNORMAL LOW (ref 26.0–34.0)
MCHC: 29.9 g/dL — ABNORMAL LOW (ref 30.0–36.0)
MCV: 81.4 fL (ref 80.0–100.0)
Monocytes Absolute: 0.8 10*3/uL (ref 0.1–1.0)
Monocytes Relative: 12 %
Neutro Abs: 4.9 10*3/uL (ref 1.7–7.7)
Neutrophils Relative %: 73 %
Platelets: 276 10*3/uL (ref 150–400)
RBC: 4.31 MIL/uL (ref 4.22–5.81)
RDW: 18.4 % — ABNORMAL HIGH (ref 11.5–15.5)
WBC: 6.8 10*3/uL (ref 4.0–10.5)
nRBC: 0.6 % — ABNORMAL HIGH (ref 0.0–0.2)

## 2020-05-26 LAB — GLUCOSE, CAPILLARY
Glucose-Capillary: 61 mg/dL — ABNORMAL LOW (ref 70–99)
Glucose-Capillary: 71 mg/dL (ref 70–99)
Glucose-Capillary: 70 mg/dL (ref 70–99)
Glucose-Capillary: 107 mg/dL — ABNORMAL HIGH (ref 70–99)

## 2020-05-26 LAB — COMPREHENSIVE METABOLIC PANEL
ALT: 27 U/L (ref 0–44)
AST: 29 U/L (ref 15–41)
Albumin: 3.2 g/dL — ABNORMAL LOW (ref 3.5–5.0)
Alkaline Phosphatase: 97 U/L (ref 38–126)
Anion gap: 12 (ref 5–15)
BUN: 39 mg/dL — ABNORMAL HIGH (ref 8–23)
CO2: 23 mmol/L (ref 22–32)
Calcium: 8.9 mg/dL (ref 8.9–10.3)
Chloride: 103 mmol/L (ref 98–111)
Creatinine, Ser: 1.41 mg/dL — ABNORMAL HIGH (ref 0.61–1.24)
GFR, Estimated: 50 mL/min — ABNORMAL LOW (ref >60)
Glucose, Bld: 75 mg/dL (ref 70–99)
Potassium: 4.6 mmol/L (ref 3.5–5.1)
Sodium: 138 mmol/L (ref 135–145)
Total Bilirubin: 1 mg/dL (ref 0.3–1.2)
Total Protein: 7 g/dL (ref 6.5–8.1)

## 2020-05-26 LAB — MAGNESIUM: Magnesium: 1.4 mg/dL — ABNORMAL LOW (ref 1.7–2.4)

## 2020-05-26 LAB — TSH: TSH: 4.049 u[IU]/mL (ref 0.350–4.500)

## 2020-05-26 LAB — HEMOGLOBIN A1C
Hgb A1c MFr Bld: 7.5 % — ABNORMAL HIGH (ref 4.8–5.6)
Mean Plasma Glucose: 168.55 mg/dL

## 2020-05-26 LAB — BRAIN NATRIURETIC PEPTIDE: B Natriuretic Peptide: 1609.8 pg/mL — ABNORMAL HIGH (ref 0.0–100.0)

## 2020-05-26 MED ORDER — ACETAMINOPHEN 325 MG PO TABS
650.0000 mg | ORAL_TABLET | ORAL | Status: DC | PRN
  Administered 2020-05-27 – 2020-06-02 (×9): 650 mg via ORAL
  Filled 2020-05-26 (×9): qty 2

## 2020-05-26 MED ORDER — INSULIN GLARGINE 100 UNIT/ML ~~LOC~~ SOLN
12.5000 [IU] | Freq: Every evening | SUBCUTANEOUS | Status: DC
  Administered 2020-05-27 – 2020-05-28 (×2): 13 [IU] via SUBCUTANEOUS
  Filled 2020-05-26 (×5): qty 0.13

## 2020-05-26 MED ORDER — GUAIFENESIN-DM 100-10 MG/5ML PO SYRP
5.0000 mL | ORAL_SOLUTION | ORAL | Status: DC | PRN
  Administered 2020-05-26 – 2020-06-02 (×16): 5 mL via ORAL
  Filled 2020-05-26 (×16): qty 5

## 2020-05-26 MED ORDER — APIXABAN 5 MG PO TABS
5.0000 mg | ORAL_TABLET | Freq: Two times a day (BID) | ORAL | Status: DC
  Administered 2020-05-26 – 2020-05-27 (×2): 5 mg via ORAL
  Filled 2020-05-26 (×2): qty 1

## 2020-05-26 MED ORDER — POTASSIUM CHLORIDE CRYS ER 10 MEQ PO TBCR
10.0000 meq | EXTENDED_RELEASE_TABLET | Freq: Two times a day (BID) | ORAL | Status: DC
  Administered 2020-05-26 – 2020-06-03 (×16): 10 meq via ORAL
  Filled 2020-05-26 (×16): qty 1

## 2020-05-26 MED ORDER — FLUTICASONE PROPIONATE 50 MCG/ACT NA SUSP
2.0000 | Freq: Every day | NASAL | Status: DC | PRN
  Filled 2020-05-26: qty 16

## 2020-05-26 MED ORDER — METFORMIN HCL ER 500 MG PO TB24
1000.0000 mg | ORAL_TABLET | Freq: Two times a day (BID) | ORAL | Status: DC
  Filled 2020-05-26 (×2): qty 2

## 2020-05-26 MED ORDER — AMIODARONE HCL 200 MG PO TABS
200.0000 mg | ORAL_TABLET | Freq: Two times a day (BID) | ORAL | Status: DC
  Administered 2020-05-26 – 2020-06-01 (×13): 200 mg via ORAL
  Filled 2020-05-26 (×13): qty 1

## 2020-05-26 MED ORDER — AZELASTINE HCL 0.1 % NA SOLN
2.0000 | Freq: Two times a day (BID) | NASAL | Status: DC | PRN
  Filled 2020-05-26: qty 30

## 2020-05-26 MED ORDER — PANTOPRAZOLE SODIUM 20 MG PO TBEC
20.0000 mg | DELAYED_RELEASE_TABLET | Freq: Every day | ORAL | Status: DC
  Administered 2020-05-27 – 2020-06-03 (×8): 20 mg via ORAL
  Filled 2020-05-26 (×8): qty 1

## 2020-05-26 MED ORDER — INSULIN ASPART 100 UNIT/ML IJ SOLN
0.0000 [IU] | Freq: Three times a day (TID) | INTRAMUSCULAR | Status: DC
  Administered 2020-05-27: 2 [IU] via SUBCUTANEOUS
  Administered 2020-05-27: 1 [IU] via SUBCUTANEOUS
  Administered 2020-05-28: 2 [IU] via SUBCUTANEOUS
  Administered 2020-05-28: 1 [IU] via SUBCUTANEOUS
  Administered 2020-05-29: 7 [IU] via SUBCUTANEOUS
  Administered 2020-05-29: 1 [IU] via SUBCUTANEOUS
  Administered 2020-05-30 (×2): 2 [IU] via SUBCUTANEOUS
  Administered 2020-05-31 – 2020-06-01 (×2): 5 [IU] via SUBCUTANEOUS
  Administered 2020-06-02 (×2): 1 [IU] via SUBCUTANEOUS
  Administered 2020-06-02: 2 [IU] via SUBCUTANEOUS
  Administered 2020-06-03: 3 [IU] via SUBCUTANEOUS
  Administered 2020-06-03: 1 [IU] via SUBCUTANEOUS

## 2020-05-26 MED ORDER — ONDANSETRON HCL 4 MG/2ML IJ SOLN: 4.0000 mg | Freq: Four times a day (QID) | INTRAMUSCULAR | Status: DC | PRN

## 2020-05-26 MED ORDER — ATORVASTATIN CALCIUM 40 MG PO TABS
40.0000 mg | ORAL_TABLET | Freq: Every day | ORAL | Status: DC
  Administered 2020-05-27 – 2020-06-03 (×8): 40 mg via ORAL
  Filled 2020-05-26 (×8): qty 1

## 2020-05-26 MED ORDER — FUROSEMIDE 10 MG/ML IJ SOLN
40.0000 mg | Freq: Three times a day (TID) | INTRAMUSCULAR | Status: DC
  Administered 2020-05-26: 40 mg via INTRAVENOUS
  Filled 2020-05-26: qty 4

## 2020-05-26 MED ORDER — APIXABAN 5 MG PO TABS: 5.0000 mg | ORAL_TABLET | Freq: Two times a day (BID) | ORAL | Status: DC

## 2020-05-26 MED ORDER — VITAMIN B-12 1000 MCG PO TABS
1000.0000 ug | ORAL_TABLET | Freq: Every day | ORAL | Status: DC
  Administered 2020-05-27 – 2020-06-03 (×8): 1000 ug via ORAL
  Filled 2020-05-26 (×8): qty 1

## 2020-05-26 MED ORDER — CARVEDILOL 3.125 MG PO TABS
3.1250 mg | ORAL_TABLET | Freq: Two times a day (BID) | ORAL | Status: DC
  Administered 2020-05-26 – 2020-06-03 (×16): 3.125 mg via ORAL
  Filled 2020-05-26 (×16): qty 1

## 2020-05-26 MED ORDER — INSULIN GLARGINE 100 UNIT/ML ~~LOC~~ SOLN
25.0000 [IU] | Freq: Every evening | SUBCUTANEOUS | Status: DC
  Filled 2020-05-26: qty 0.25

## 2020-05-26 MED ORDER — VITAMIN D 25 MCG (1000 UNIT) PO TABS
1000.0000 [IU] | ORAL_TABLET | Freq: Every evening | ORAL | Status: DC
  Administered 2020-05-26 – 2020-06-02 (×8): 1000 [IU] via ORAL
  Filled 2020-05-26 (×8): qty 1

## 2020-05-26 MED ORDER — LORATADINE 10 MG PO TABS
10.0000 mg | ORAL_TABLET | Freq: Every day | ORAL | Status: DC
  Administered 2020-05-27 – 2020-06-03 (×8): 10 mg via ORAL
  Filled 2020-05-26 (×8): qty 1

## 2020-05-26 MED ORDER — SODIUM CHLORIDE 0.9 % IV SOLN: 250.0000 mL | INTRAVENOUS | Status: DC | PRN

## 2020-05-26 MED ORDER — INSULIN GLARGINE (1 UNIT DIAL) 300 UNIT/ML ~~LOC~~ SOPN: 25.0000 [IU] | PEN_INJECTOR | SUBCUTANEOUS | Status: DC

## 2020-05-26 MED ORDER — SODIUM CHLORIDE 0.9% FLUSH: 3.0000 mL | INTRAVENOUS | Status: DC | PRN

## 2020-05-26 MED ORDER — SODIUM CHLORIDE 0.9% FLUSH
3.0000 mL | Freq: Two times a day (BID) | INTRAVENOUS | Status: DC
  Administered 2020-05-26 – 2020-06-03 (×17): 3 mL via INTRAVENOUS

## 2020-05-26 NOTE — Plan of Care (Signed)
  Problem: Education: Goal: Ability to demonstrate management of disease process will improve 05/26/2020 1820 by Stephan Minister, RN Outcome: Progressing 05/26/2020 1818 by Stephan Minister, RN Outcome: Progressing Goal: Ability to verbalize understanding of medication therapies will improve 05/26/2020 1820 by Stephan Minister, RN Outcome: Progressing 05/26/2020 1818 by Stephan Minister, RN Outcome: Progressing Goal: Individualized Educational Video(s) 05/26/2020 1820 by Stephan Minister, RN Outcome: Progressing 05/26/2020 1818 by Stephan Minister, RN Outcome: Progressing   Problem: Activity: Goal: Capacity to carry out activities will improve 05/26/2020 1820 by Stephan Minister, RN Outcome: Progressing 05/26/2020 1818 by Stephan Minister, RN Outcome: Progressing   Problem: Cardiac: Goal: Ability to achieve and maintain adequate cardiopulmonary perfusion will improve 05/26/2020 1820 by Stephan Minister, RN Outcome: Progressing 05/26/2020 1818 by Stephan Minister, RN Outcome: Progressing   Problem: Education: Goal: Knowledge of General Education information will improve Description: Including pain rating scale, medication(s)/side effects and non-pharmacologic comfort measures Outcome: Progressing   Problem: Health Behavior/Discharge Planning: Goal: Ability to manage health-related needs will improve Outcome: Progressing   Problem: Clinical Measurements: Goal: Ability to maintain clinical measurements within normal limits will improve Outcome: Progressing Goal: Will remain free from infection Outcome: Progressing Goal: Diagnostic test results will improve Outcome: Progressing Goal: Respiratory complications will improve Outcome: Progressing Goal: Cardiovascular complication will be avoided Outcome: Progressing   Problem: Activity: Goal: Risk for activity intolerance will decrease Outcome: Progressing   Problem: Nutrition: Goal: Adequate nutrition will be maintained Outcome: Progressing   Problem: Coping: Goal: Level  of anxiety will decrease Outcome: Progressing   Problem: Elimination: Goal: Will not experience complications related to bowel motility Outcome: Progressing Goal: Will not experience complications related to urinary retention Outcome: Progressing   Problem: Pain Managment: Goal: General experience of comfort will improve Outcome: Progressing   Problem: Safety: Goal: Ability to remain free from injury will improve Outcome: Progressing   Problem: Skin Integrity: Goal: Risk for impaired skin integrity will decrease Outcome: Progressing

## 2020-05-26 NOTE — Progress Notes (Signed)
RT noe. Patient placed on cpap 11, patients home setting. VSS, RT will continue to monitor.

## 2020-05-26 NOTE — H&P (Signed)
Cardiology Admission    Date:  05/26/2020   ID:  Wesley Winters, DOB [birth year], MRN [id number]  PCP:  [name], MD  Cardiologist:  Sinclair Grooms, MD   Referring MD: [name], MD   Chief Complaint  Patient presents with  . Atrial Fibrillation  . Congestive Heart Failure    History of Present Illness:    Wesley Winters is a 80 y.o. male with a hx of primary hypertension, hyperlipidemia, OSA,CAD (stent to LAD and RCA in 1998 and 1999; patent coronary arteries 9767), acute systolic HF 34%, and AF unknown duration 04/23/2020.   Wesley Winters has recently identified new acute systolic heart failure of unknown duration.  Most current EF of 25% compared with prior greater than 50% EF.  Also has new atrial fibrillation first identified April 23, 2020.  We have embarked on a management strategy that was to include initial rate control, diuresis, anticoagulation for 3 to 4 weeks, and eventually electrical cardioversion.  Had previously been on combination of HCTZ and furosemide.  HCTZ was dropped.  Furosemide intensity was not increased because of relatively low blood pressures.  He returns today having continued orthopnea, cough, dyspnea on exertion, and 10 pound weight gain.  He is not having a good diuretic effect on low-dose furosemide, 20 mg/day.  He denies angina.  He denies PND.  He has persistent lower extremity swelling.  Abdominal girth appears to be increasing.  He wears CPAP.  He sleeps with his bed elevated at approximately 30 degrees.  He initially opposed to hospitalization.  He has not gotten any better over the past month and is now willing to consider hospitalization for more rapid improvement in clinical status.  Past Medical History:  Diagnosis Date  . Balanitis   . Bladder calculus   . Coronary artery disease    stents x3 by Dr Tamala Julian 1998  . Depression   . Diabetes mellitus without complication ([place])   . GERD (gastroesophageal reflux disease)   .  Hypercholesterolemia   . Hypertension   . Phimosis   . Prostate cancer ([place])   . Sleep apnea    wears CPAP nightly    Past Surgical History:  Procedure Laterality Date  . BIOPSY  07/10/2019   Procedure: BIOPSY;  Surgeon: Otis Brace, MD;  Location: [facility];  Service: Gastroenterology;;  . [place] Meek    . CYSTOSCOPY    . ESOPHAGOGASTRODUODENOSCOPY (EGD) WITH PROPOFOL N/A 07/10/2019   Procedure: ESOPHAGOGASTRODUODENOSCOPY (EGD) WITH PROPOFOL;  Surgeon: Otis Brace, MD;  Location: [facility];  Service: Gastroenterology;  Laterality: N/A;  . HERNIA REPAIR     umbilical  . PROSTATE BIOPSY    . PROSTATE BIOPSY    . PROSTATE BIOPSY    . three cardiac stents      Current Medications: Current Meds  Medication Sig  . acetaminophen (TYLENOL) 500 MG tablet Take 500-1,000 mg by mouth 3 (three) times daily as needed for moderate pain.  Marland Kitchen amiodarone (PACERONE) 200 MG tablet Take 1 tablet (200 mg total) by mouth 2 (two) times daily.  Marland Kitchen apixaban (ELIQUIS) 5 MG TABS tablet Take 1 tablet (5 mg total) by mouth 2 (two) times daily.  Marland Kitchen atorvastatin (LIPITOR) 40 MG tablet Take 40 mg by mouth daily.  Marland Kitchen augmented betamethasone dipropionate (DIPROLENE-AF) 0.05 % cream Apply topically.  Marland Kitchen azelastine (ASTELIN) 0.1 % nasal spray Place 2 sprays into both nostrils 2 (two) times daily as needed for allergies. Use in each nostril as directed  .  B-D UF III MINI PEN NEEDLES 31G X 5 MM MISC SMARTSIG:1 SUB-Q Every Night  . benazepril (LOTENSIN) 20 MG tablet Take 20 mg by mouth daily.  . betamethasone dipropionate 0.05 % cream Apply 1 application topically 2 (two) times daily.  . carvedilol (COREG) 3.125 MG tablet Take 1 tablet (3.125 mg total) by mouth 2 (two) times daily with a meal.  . cetirizine (ZYRTEC) 10 MG tablet 1 tablet  . cholecalciferol (VITAMIN D3) 25 MCG (1000 UT) tablet Take 1,000 Units by mouth every evening.   . Cyanocobalamin 1000 MCG/15ML LIQD 1 tablet  . fluticasone (FLONASE)  50 MCG/ACT nasal spray Place 2 sprays into both nostrils daily as needed for allergies.   . furosemide (LASIX) 20 MG tablet Take 1 tablet (20 mg total) by mouth daily.  . Insulin Glargine 300 UNIT/ML SOPN Inject 25 Units into the skin every evening.   . metFORMIN (GLUCOPHAGE-XR) 500 MG 24 hr tablet Take 1,000 mg by mouth 2 (two) times daily.   . mupirocin ointment (BACTROBAN) 2 % APPLY TO RIGHT GREAT TOE AND RIGHT 3RD TOE ONCE DAILY  . ONETOUCH ULTRA test strip 1 each 3 (three) times daily.  . pantoprazole (PROTONIX) 20 MG tablet Take 20 mg by mouth daily.  . polyethylene glycol (MIRALAX / GLYCOLAX) 17 g packet 1 packet mixed with 8 ounces of fluid  . potassium chloride (K-DUR,KLOR-CON) 10 MEQ tablet Take 10 mEq by mouth 2 (two) times daily.   Marland Kitchen Respiratory Therapy Supplies (CARETOUCH 2 CPAP HOSE HANGER) MISC 11cm  . sucralfate (CARAFATE) 1 g tablet Take 1 g by mouth in the morning and at bedtime.  Nelva Nay SOLOSTAR 300 UNIT/ML Solostar Pen SMARTSIG:25 Unit(s) SUB-Q Every Evening  . triamcinolone cream (KENALOG) 0.1 % Apply 1 application topically 2 (two) times daily as needed (rash).   . vitamin B-12 (CYANOCOBALAMIN) 1000 MCG tablet Take 1,000 mcg by mouth daily.      Allergies:   Amoxicillin, Amoxicillin-pot clavulanate, Empagliflozin, and Metformin hcl   Social History   Socioeconomic History  . Marital status: Married    Spouse name: Not on file  . Number of children: Not on file  . Years of education: Not on file  . Highest education level: Not on file  Occupational History  . Not on file  Tobacco Use  . Smoking status: Never Smoker  . Smokeless tobacco: Never Used  Substance and Sexual Activity  . Alcohol use: No  . Drug use: No  . Sexual activity: Yes  Other Topics Concern  . Not on file  Social History Narrative  . Not on file   Social Determinants of Health   Financial Resource Strain: Not on file  Food Insecurity: Not on file  Transportation Needs: Not on file   Physical Activity: Not on file  Stress: Not on file  Social Connections: Not on file     Family History: The patient's family history includes Arrhythmia in his brother and mother; Cancer in his brother; Heart disease in his brother and mother.  ROS:   Please see the history of present illness.    Not making much urine.  Bringing up yellow phlegm.  Appetite is decreasing.  All other systems reviewed and are negative.  EKGs/Labs/Other Studies Reviewed:    The following studies were reviewed today:  ECHOCARDIOGRAM 2022: IMPRESSIONS  1. Left ventricular ejection fraction, by estimation, is 20 to 25%. The  left ventricle has severely decreased function. The left ventricle  demonstrates global hypokinesis. The  left ventricular internal cavity size  was moderately dilated. Left  ventricular diastolic parameters are indeterminate. Elevated left  ventricular end-diastolic pressure.  2. Right ventricular systolic function is moderately reduced. The right  ventricular size is normal. There is mildly elevated pulmonary artery  systolic pressure.  3. Left atrial size was severely dilated.  4. Right atrial size was mildly dilated.  5. The mitral valve is normal in structure. Mild mitral valve  regurgitation. No evidence of mitral stenosis.  6. The aortic valve is normal in structure. Aortic valve regurgitation is  not visualized. No aortic stenosis is present.  7. The inferior vena cava is normal in size with greater than 50%  respiratory variability, suggesting right atrial pressure of 3 mmHg.   EKG:  EKG atrial fibrillation with moderate rate control, left bundle branch block, and when compared to the prior tracing performed on 04/23/2020, the heart rate is improved.  Recent Labs: 04/23/2020: ALT 8; Hemoglobin 10.6; NT-Pro BNP 2,488; Platelets 301; TSH 2.110 05/12/2020: BUN 28; Creatinine, Ser 1.13; Potassium 3.4; Sodium 139  Recent Lipid Panel No results found for: CHOL, TRIG,  HDL, CHOLHDL, VLDL, LDLCALC, LDLDIRECT  Physical Exam:    VS:  BP 104/62   Pulse (!) 101   Ht 5\' 8"  (1.727 m)   Wt 233 lb (105.7 kg)   SpO2 99%   BMI 35.43 kg/m     Wt Readings from Last 3 Encounters:  05/26/20 233 lb (105.7 kg)  05/12/20 228 lb (103.4 kg)  04/23/20 223 lb 3.2 oz (101.2 kg)     GEN: Elderly, obese,. No acute distress HEENT: Conjunctival erythema. NECK: CV wave noted in the mid anterior cervical region with the patient sitting upright. LYMPHATICS: No lymphadenopathy CARDIAC: Soft apical systolic murmur.  Irregularly irregular without obvious gallop, but with 2+ bilateral ankle to shin edema. VASCULAR: Difficult to palpate pedal pulses. No bruits. RESPIRATORY:  Clear to auscultation with basilar rales but no wheezing. ABDOMEN: Soft, non-tender, non-distended, No pulsatile mass, MUSCULOSKELETAL: No deformity  SKIN: Warm and dry NEUROLOGIC:  Alert and oriented x 3 PSYCHIATRIC:  Normal affect   ASSESSMENT:    1. Atrial fibrillation, unspecified type ([place])   2. Acute on chronic systolic HF (heart failure) ([place])   3. Essential hypertension   4. Hyperlipidemia LDL goal <70   5. Type 2 diabetes mellitus with other circulatory complication, without long-term current use of insulin ([place])   6. Anticoagulation adequate   7. CAD in native artery    PLAN:    In order of problems listed above:  1. Better rate control than previously on amiodarone and low-dose carvedilol.  Will plan to admit to hospital, consider electrical cardioversion during the admission.  He has been continuously anticoagulated since April 24, 2020. 2. IV diuresis will be started with 40 mg of furosemide every 12 hours and then adjust dose based upon blood pressure and urine output. 3. Blood pressures run relatively low as an outpatient.  For the time being we will hold Lotensin/benazepril. 4. Continue Lipitor 40 mg/day. 5. Consider starting SGLT2.  He has been tried on empagliflozin but it  caused nausea and jittery feeling. 6. Continue Eliquis 5 mg twice daily.  May need adjustment based upon kidney function.  Has been on continuous therapy for 4 weeks. 7. No symptoms to suggest angina currently.  Planning admission today for IV Lasix, hold losartan, check electrolytes, get PA lateral chest x-ray, consider cardioversion later this week with continued diuresis to get patient back  to a more stable state.  SGLT2 therapy has been tried but is worth reconsideration.  Also MRA could be added.  Reinstitute ACE/ARB after diuresis as kidneys allow.

## 2020-05-26 NOTE — Progress Notes (Signed)
   After reviewing the labs, have decided not to give additional doses of furosemide.  A single 40 mg dose was given at 445 this afternoon.  We will see what the basic metabolic panel looks like in the a.m.

## 2020-05-26 NOTE — Patient Instructions (Signed)
Medication Instructions:  Your physician recommends that you continue on your current medications as directed. Please refer to the Current Medication list given to you today.  *If you need a refill on your cardiac medications before your next appointment, please call your pharmacy*   Lab Work: None If you have labs (blood work) drawn today and your tests are completely normal, you will receive your results only by: Marland Kitchen MyChart Message (if you have MyChart) OR . A paper copy in the mail If you have any lab test that is abnormal or we need to change your treatment, we will call you to review the results.   Testing/Procedures: None   Follow-Up: Will be based on hospital stay   Other Instructions  You are being admitted to [facility].  They will call you when there is a bed available.

## 2020-05-27 ENCOUNTER — Inpatient Hospital Stay (HOSPITAL_COMMUNITY): Payer: Medicare Other

## 2020-05-27 ENCOUNTER — Other Ambulatory Visit (HOSPITAL_COMMUNITY): Payer: Self-pay

## 2020-05-27 DIAGNOSIS — E785 Hyperlipidemia, unspecified: Secondary | ICD-10-CM | POA: Diagnosis not present

## 2020-05-27 DIAGNOSIS — I2510 Atherosclerotic heart disease of native coronary artery without angina pectoris: Secondary | ICD-10-CM

## 2020-05-27 DIAGNOSIS — E0810 Diabetes mellitus due to underlying condition with ketoacidosis without coma: Secondary | ICD-10-CM | POA: Diagnosis not present

## 2020-05-27 DIAGNOSIS — I5021 Acute systolic (congestive) heart failure: Secondary | ICD-10-CM | POA: Diagnosis not present

## 2020-05-27 LAB — GLUCOSE, CAPILLARY
Glucose-Capillary: 94 mg/dL (ref 70–99)
Glucose-Capillary: 147 mg/dL — ABNORMAL HIGH (ref 70–99)
Glucose-Capillary: 156 mg/dL — ABNORMAL HIGH (ref 70–99)
Glucose-Capillary: 155 mg/dL — ABNORMAL HIGH (ref 70–99)

## 2020-05-27 LAB — SARS CORONAVIRUS 2 (TAT 6-24 HRS): SARS Coronavirus 2: NEGATIVE

## 2020-05-27 LAB — BASIC METABOLIC PANEL
Anion gap: 10 (ref 5–15)
BUN: 39 mg/dL — ABNORMAL HIGH (ref 8–23)
CO2: 26 mmol/L (ref 22–32)
Calcium: 8.7 mg/dL — ABNORMAL LOW (ref 8.9–10.3)
Chloride: 101 mmol/L (ref 98–111)
Creatinine, Ser: 1.43 mg/dL — ABNORMAL HIGH (ref 0.61–1.24)
GFR, Estimated: 49 mL/min — ABNORMAL LOW (ref >60)
Glucose, Bld: 99 mg/dL (ref 70–99)
Potassium: 4.1 mmol/L (ref 3.5–5.1)
Sodium: 137 mmol/L (ref 135–145)

## 2020-05-27 LAB — PROTIME-INR
INR: 2.3 — ABNORMAL HIGH (ref 0.8–1.2)
Prothrombin Time: 25.3 seconds — ABNORMAL HIGH (ref 11.4–15.2)

## 2020-05-27 MED ORDER — EMPAGLIFLOZIN 10 MG PO TABS
10.0000 mg | ORAL_TABLET | Freq: Every day | ORAL | Status: DC
  Administered 2020-05-27 – 2020-06-03 (×8): 10 mg via ORAL
  Filled 2020-05-27 (×8): qty 1

## 2020-05-27 MED ORDER — DAPAGLIFLOZIN PROPANEDIOL 10 MG PO TABS
10.0000 mg | ORAL_TABLET | Freq: Every day | ORAL | Status: DC
  Administered 2020-05-27: 10 mg via ORAL
  Filled 2020-05-27: qty 1

## 2020-05-27 MED ORDER — APIXABAN 5 MG PO TABS
5.0000 mg | ORAL_TABLET | Freq: Two times a day (BID) | ORAL | Status: DC
  Administered 2020-05-27 – 2020-05-30 (×6): 5 mg via ORAL
  Filled 2020-05-27 (×6): qty 1

## 2020-05-27 MED ORDER — FUROSEMIDE 10 MG/ML IJ SOLN
40.0000 mg | Freq: Two times a day (BID) | INTRAMUSCULAR | Status: DC
  Administered 2020-05-27 – 2020-06-03 (×14): 40 mg via INTRAVENOUS
  Filled 2020-05-27 (×15): qty 4

## 2020-05-27 NOTE — Progress Notes (Signed)
Patient placed on cpap

## 2020-05-27 NOTE — TOC Benefit Eligibility Note (Signed)
Patient Teacher, English as a foreign language completed.    The patient is currently admitted and upon discharge could be taking Farxiga 10 mg.  Non Formulary.   The patient is insured through [employer] [place] Medicare Part D    Lyndel Safe, [place] Patient Advocate Specialist [place] Team Direct Number: [phone]  Fax: [phone]

## 2020-05-27 NOTE — Progress Notes (Signed)
   05/27/20 1034  Mobility  Activity Contraindicated/medical hold (Hold per RN)

## 2020-05-27 NOTE — Progress Notes (Signed)
Heart Failure Navigation Team Progress Note  PCP: [name], MD Primary Cardiologist: Daneen Schick, MD Admitted from: home  Past Medical History:  Diagnosis Date  . Balanitis   . Bladder calculus   . Coronary artery disease    stents x3 by Dr Tamala Julian 1998  . Depression   . Diabetes mellitus without complication ([place])   . GERD (gastroesophageal reflux disease)   . Hypercholesterolemia   . Hypertension   . Phimosis   . Prostate cancer ([place])   . Sleep apnea    wears CPAP nightly    Social History   Socioeconomic History  . Marital status: Married    Spouse name: Not on file  . Number of children: Not on file  . Years of education: Not on file  . Highest education level: Not on file  Occupational History  . Not on file  Tobacco Use  . Smoking status: Never Smoker  . Smokeless tobacco: Never Used  Substance and Sexual Activity  . Alcohol use: No  . Drug use: No  . Sexual activity: Yes  Other Topics Concern  . Not on file  Social History Narrative  . Not on file   Social Determinants of Health   Financial Resource Strain: Low Risk   . Difficulty of Paying Living Expenses: Not very hard  Food Insecurity: No Food Insecurity  . Worried About Charity fundraiser in the Last Year: Never true  . Ran Out of Food in the Last Year: Never true  Transportation Needs: No Transportation Needs  . Lack of Transportation (Medical): No  . Lack of Transportation (Non-Medical): No  Physical Activity: Not on file  Stress: Not on file  Social Connections: Not on file     Heart & Vascular Transition of Care Clinic follow-up: Scheduled for 06/05/2020 Thursday at 11.  Confirmed patient has transportation.  Immediate social needs: Medication assistance program to be addressed at [facility] outpatient clinic appointment.  Thom Ollinger, MSW, [place] Heart Failure Social Worker

## 2020-05-27 NOTE — Progress Notes (Signed)
DAILY PROGRESS NOTE   Patient Name: Wesley Winters Date of Encounter: 05/27/2020 Cardiologist: Sinclair Grooms, MD  Chief Complaint   Breathing mildly improved.  Patient Profile   Wesley Winters is a 80 y.o. male with a hx of primary hypertension, hyperlipidemia, OSA,CAD (stent to LAD and RCA in 1998 and 1999; patent coronary arteries 7902), acute systolic HF 40%, and AF unknown duration 04/23/2020.  Subjective   Net positive recorded overnight - however, weight down 1 Kg. Creatinine is stable with diuresis. BNP very high at 1609. CXR shows small effusions. COVID negative. Cool extremities - probably in low output heart failure.  Objective   Vitals:   05/27/20 0200 05/27/20 0300 05/27/20 0356 05/27/20 0708  BP:   115/72 104/70  Pulse:   (!) 120   Resp: 18 20 18 20   Temp:   98 F (36.7 C) 98.1 F (36.7 C)  TempSrc:   Oral Oral  SpO2:   97% 97%  Weight:   102.7 kg     Intake/Output Summary (Last 24 hours) at 05/27/2020 0955 Last data filed at 05/27/2020 9735 Gross per 24 hour  Intake 1140 ml  Output 600 ml  Net 540 ml   Filed Weights   05/26/20 1346 05/27/20 0356  Weight: 103.4 kg 102.7 kg    Physical Exam   General appearance: alert, no distress, morbidly obese and pale Neck: JVD - 5 cm above sternal notch, no carotid bruit, supple, symmetrical, trachea midline and thyroid not enlarged, symmetric, no tenderness/mass/nodules Lungs: diminished breath sounds bilaterally Heart: irregularly irregular rhythm Abdomen: soft, non-tender; bowel sounds normal; no masses,  no organomegaly and obese Extremities: edema 2+ bilateral LE Edema Pulses: 1+ pulses Skin: pale, cool extremities, dry Neurologic: Grossly normal Psych: Pleasant  Inpatient Medications    Scheduled Meds: . amiodarone  200 mg Oral BID  . apixaban  5 mg Oral BID  . atorvastatin  40 mg Oral Daily  . carvedilol  3.125 mg Oral BID WC  . cholecalciferol  1,000 Units Oral QPM  . insulin aspart  0-9  Units Subcutaneous TID WC  . insulin glargine  13 Units Subcutaneous QPM  . loratadine  10 mg Oral Daily  . pantoprazole  20 mg Oral Daily  . potassium chloride  10 mEq Oral BID  . sodium chloride flush  3 mL Intravenous Q12H  . vitamin B-12  1,000 mcg Oral Daily    Continuous Infusions: . sodium chloride      PRN Meds: sodium chloride, acetaminophen, azelastine, fluticasone, guaiFENesin-dextromethorphan, ondansetron (ZOFRAN) IV, sodium chloride flush   Labs   Results for orders placed or performed during the hospital encounter of 05/26/20 (from the past 48 hour(s))  Glucose, capillary     Status: Abnormal   Collection Time: 05/26/20  2:09 PM  Result Value Ref Range   Glucose-Capillary 61 (L) 70 - 99 mg/dL    Comment: Glucose reference range applies only to samples taken after fasting for at least 8 hours.  Comprehensive metabolic panel     Status: Abnormal   Collection Time: 05/26/20  2:29 PM  Result Value Ref Range   Sodium 138 135 - 145 mmol/L   Potassium 4.6 3.5 - 5.1 mmol/L   Chloride 103 98 - 111 mmol/L   CO2 23 22 - 32 mmol/L   Glucose, Bld 75 70 - 99 mg/dL    Comment: Glucose reference range applies only to samples taken after fasting for at least 8 hours.  BUN 39 (H) 8 - 23 mg/dL   Creatinine, Ser 1.41 (H) 0.61 - 1.24 mg/dL   Calcium 8.9 8.9 - 10.3 mg/dL   Total Protein 7.0 6.5 - 8.1 g/dL   Albumin 3.2 (L) 3.5 - 5.0 g/dL   AST 29 15 - 41 U/L   ALT 27 0 - 44 U/L   Alkaline Phosphatase 97 38 - 126 U/L   Total Bilirubin 1.0 0.3 - 1.2 mg/dL   GFR, Estimated 50 (L) >60 mL/min    Comment: (NOTE) Calculated using the CKD-EPI Creatinine Equation (2021)    Anion gap 12 5 - 15    Comment: Performed at [facility] [address], [place], [place] [address]  CBC WITH DIFFERENTIAL     Status: Abnormal   Collection Time: 05/26/20  2:29 PM  Result Value Ref Range   WBC 6.8 4.0 - 10.5 K/uL   RBC 4.31 4.22 - 5.81 MIL/uL   Hemoglobin 10.5 (L) 13.0 - 17.0 g/dL    HCT 35.1 (L) 39.0 - 52.0 %   MCV 81.4 80.0 - 100.0 fL   MCH 24.4 (L) 26.0 - 34.0 pg   MCHC 29.9 (L) 30.0 - 36.0 g/dL   RDW 18.4 (H) 11.5 - 15.5 %   Platelets 276 150 - 400 K/uL   nRBC 0.6 (H) 0.0 - 0.2 %   Neutrophils Relative % 73 %   Neutro Abs 4.9 1.7 - 7.7 K/uL   Lymphocytes Relative 15 %   Lymphs Abs 1.0 0.7 - 4.0 K/uL   Monocytes Relative 12 %   Monocytes Absolute 0.8 0.1 - 1.0 K/uL   Eosinophils Relative 0 %   Eosinophils Absolute 0.0 0.0 - 0.5 K/uL   Basophils Relative 0 %   Basophils Absolute 0.0 0.0 - 0.1 K/uL   Immature Granulocytes 0 %   Abs Immature Granulocytes 0.03 0.00 - 0.07 K/uL    Comment: Performed at [facility], [place] [address] [place], [place] [address]  Magnesium     Status: Abnormal   Collection Time: 05/26/20  2:29 PM  Result Value Ref Range   Magnesium 1.4 (L) 1.7 - 2.4 mg/dL    Comment: Performed at [facility] [address], [place], [place] [address]  Brain natriuretic peptide     Status: Abnormal   Collection Time: 05/26/20  2:29 PM  Result Value Ref Range   B Natriuretic Peptide 1,609.8 (H) 0.0 - 100.0 pg/mL    Comment: Performed at [facility] [address], [place], [place] [address]  TSH     Status: None   Collection Time: 05/26/20  2:29 PM  Result Value Ref Range   TSH 4.049 0.350 - 4.500 uIU/mL    Comment: Performed by a 3rd Generation assay with a functional sensitivity of <=0.01 uIU/mL. Performed at [facility], [place] [address], [place], [place] [address]   Hemoglobin A1c     Status: Abnormal   Collection Time: 05/26/20  2:29 PM  Result Value Ref Range   Hgb A1c MFr Bld 7.5 (H) 4.8 - 5.6 %    Comment: (NOTE) Pre diabetes:          5.7%-6.4%  Diabetes:              >6.4%  Glycemic control for   <7.0% adults with diabetes    Mean Plasma Glucose 168.55 mg/dL    Comment: Performed at [facility] [place],  [place] [address]  Glucose, capillary     Status: None   Collection Time:  05/26/20  4:04 PM  Result Value Ref Range   Glucose-Capillary 71 70 - 99 mg/dL    Comment: Glucose reference range applies only to samples taken after fasting for at least 8 hours.  Glucose, capillary     Status: None   Collection Time: 05/26/20  8:24 PM  Result Value Ref Range   Glucose-Capillary 70 70 - 99 mg/dL    Comment: Glucose reference range applies only to samples taken after fasting for at least 8 hours.  Glucose, capillary     Status: Abnormal   Collection Time: 05/26/20 10:03 PM  Result Value Ref Range   Glucose-Capillary 107 (H) 70 - 99 mg/dL    Comment: Glucose reference range applies only to samples taken after fasting for at least 8 hours.  SARS CORONAVIRUS 2 (TAT 6-24 HRS) Nasopharyngeal Nasopharyngeal Swab     Status: None   Collection Time: 05/26/20 10:19 PM   Specimen: Nasopharyngeal Swab  Result Value Ref Range   SARS Coronavirus 2 NEGATIVE NEGATIVE    Comment: (NOTE) SARS-CoV-2 target nucleic acids are NOT DETECTED.  The SARS-CoV-2 RNA is generally detectable in upper and lower respiratory specimens during the acute phase of infection. Negative results do not preclude SARS-CoV-2 infection, do not rule out co-infections with other pathogens, and should not be used as the sole basis for treatment or other patient management decisions. Negative results must be combined with clinical observations, patient history, and epidemiological information. The expected result is Negative.  Fact Sheet for Patients: SugarRoll.be  Fact Sheet for Healthcare Providers: https://www.woods-mathews.com/  This test is not yet approved or cleared by the [place] FDA and  has been authorized for detection and/or diagnosis of SARS-CoV-2 by FDA under an Emergency Use Authorization (EUA). This EUA will remain  in effect (meaning this test can be used) for the duration of the COVID-19 declaration under Se ction 564(b)(1) of the Act, 21  U.S.C. section 360bbb-3(b)(1), unless the authorization is terminated or revoked sooner.  Performed at [facility], [place] [address] [place], [place] [address]   Basic metabolic panel     Status: Abnormal   Collection Time: 05/27/20  3:01 AM  Result Value Ref Range   Sodium 137 135 - 145 mmol/L   Potassium 4.1 3.5 - 5.1 mmol/L   Chloride 101 98 - 111 mmol/L   CO2 26 22 - 32 mmol/L   Glucose, Bld 99 70 - 99 mg/dL    Comment: Glucose reference range applies only to samples taken after fasting for at least 8 hours.   BUN 39 (H) 8 - 23 mg/dL   Creatinine, Ser 1.43 (H) 0.61 - 1.24 mg/dL   Calcium 8.7 (L) 8.9 - 10.3 mg/dL   GFR, Estimated 49 (L) >60 mL/min    Comment: (NOTE) Calculated using the CKD-EPI Creatinine Equation (2021)    Anion gap 10 5 - 15    Comment: Performed at [facility] [address] [place], [place] [address]  Glucose, capillary     Status: None   Collection Time: 05/27/20  5:52 AM  Result Value Ref Range   Glucose-Capillary 94 70 - 99 mg/dL    Comment: Glucose reference range applies only to samples taken after fasting for at least 8 hours.    ECG   N/A  Telemetry   Afib with RVR - Personally Reviewed  Radiology    DG  Chest 2 View  Result Date: 05/27/2020 CLINICAL DATA:  Shortness of breath EXAM: CHEST - 2 VIEW COMPARISON:  07/26/2019 FINDINGS: Cardiac shadow is prominent but accentuated by the portable technique. Aortic calcifications are again noted and stable. The lungs are well aerated bilaterally. Small posterior effusions are noted best seen on the lateral projection. No bony abnormality is seen. IMPRESSION: Small effusions without focal infiltrate. No other focal abnormality is noted. Electronically Signed   By: Inez Catalina M.D.   On: 05/27/2020 08:18    Cardiac Studies   N/A  Assessment   1. Active Problems: 2.   Diabetes ([place]) 3.   CAD in native artery 4.   Hyperlipidemia LDL goal <70 5.   Acute systolic heart failure  (HCC) 6.   Persistent atrial fibrillation ([place]) 7.   Chronic anticoagulation 8.   Plan   1. Acute decompensated systolic congestive heart failure -Wesley Winters appears to be in low output heart failure with cool extremities and elevated creatinine with minimal response to diuretics.  We will plan to increase his diuretics today.  I think he would greatly benefit from achieving sinus rhythm given his low LVEF.  He has been anticoagulated for more than 3 weeks without interruption on Eliquis and has been on amiodarone.  We have arranged a DCCV tomorrow at 7:30 am with Dr. Oval Linsey.  Will consider d/c carvedilol due to low output failure after cardioversion.  Dr. Tamala Julian is currently held his benazepril. 2. Persistent atrial fibrillation -rates are little fast, on p.o. amiodarone and carvedilol.  We will plan to proceed with DCCV hopefully tomorrow.  Please keep n.p.o. after midnight. 3. CAD -no active anginal symptoms. 4. Dyslipidemia -continue atorvastatin. 5. DM2 -on insulin and metformin.  A1C 7.5.  Would recommend starting Farxiga 10 mg daily - he previously was tried on Jardiance but had nausea/jittery feeling - monitor for similar symptoms.  Time Spent Directly with Patient:  I have spent a total of 35 minutes with the patient reviewing hospital notes, telemetry, EKGs, labs and examining the patient as well as establishing an assessment and plan that was discussed personally with the patient.  > 50% of time was spent in direct patient care.  Length of Stay:  LOS: 1 day   Pixie Casino, MD, [facility], [place] Director of the Advanced Lipid Disorders &  Cardiovascular Risk Reduction Clinic Diplomate of the American Board of Clinical Lipidology Attending Cardiologist  Direct Dial: [phone]  Fax: [phone]  Website:  [email or web address] Plater 05/27/2020, 9:55 AM

## 2020-05-27 NOTE — Progress Notes (Signed)
Magnesium was 1.4 yesterday, then Lasix 40 IV was given. MD paged.

## 2020-05-27 NOTE — Progress Notes (Signed)
Heart Failure Stewardship Pharmacist Progress Note   PCP: [name], MD PCP-Cardiologist: Sinclair Grooms, MD    HPI:  80 yo male with PMH HTN, HLD, OSA, CAD s/p PCI to LAD and RCA in 1998 and 1540, acute systolic HF, and AF of unknown duration on 04/23/20. Recently diagnosed with new systolic HF with most recent ECHO on 05/05/20 with LVEF 20-25% with global hypokinesis which is down from 45-50% in 2020. Admitted with orthopnea, cough, dyspnea on exertion, and 10 lb weight gain. BNP elevated on admit. Started on IV diuresis. He reports that he has an intolerance to Jardiance (jittery, anxious) in the past but is willing to retry medication while in the hospital. He thinks it might not be medication related. For DCCV tomorrow morning.  Current HF Medications: Carvedilol 3.125 mg BID  Prior to admission HF Medications: PO furosemide 20 mg daily Carvedilol 3.125 mg BID Benazepril 20 mg daily KCl 10 mEq BID   Pertinent Lab Values: . Serum creatinine 1.43, BUN 39, Potassium 4.1, Sodium 137 . 05/26/20: BNP 1609, Magnesium 1.4  Vital Signs: . Weight: 226.4 lbs (admission weight: 227.9 lbs) . Blood pressure: 100/70s  . Heart rate: 110s   Medication Assistance / Insurance Benefits Check: Does the patient have prescription insurance?  Yes Type of insurance plan: BCBS Medicare  Does the patient qualify for medication assistance through manufacturers or grants?   Pending . Eligible grants and/or patient assistance programs: Pending . Medication assistance applications in progress: None  . Medication assistance applications approved: None Approved medication assistance renewals will be completed by: Pending  Outpatient Pharmacy:  Prior to admission outpatient pharmacy: [facility] Is the patient willing to use [facility] at discharge? Yes Is the patient willing to transition their outpatient pharmacy to utilize a [facility] outpatient pharmacy?   Pending    Assessment: 1. 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 NYHA class II symptoms. - Volume still up, 500 cc uop and weight down 1 lb after 1 dose of IV furosemide 40 mg. Breathing is improved, renal function stable - restart IV furosemide 40 mg BID - Continue carvedilol 3.125 mg BID - BP too soft for ARB or ARNI at this time - Consider starting spironolactone 12.5 mg daily prior to discharge pending BP  - Consider adding Jardiance 10 mg daily. Patient is willing to retry medication while inpatient.   Plan: 1) Medication changes recommended at this time: - Restart IV furosemide 40 mg BID - Start Jardiance 10 mg daily  2) Patient assistance: - Jardiance copay $10/month - Farxiga not on formulary  3)  Education  - To be completed prior to discharge  Richardine Service, PharmD, BCPS Kerby Nora, PharmD, BCPS Heart Failure Cytogeneticist Phone [phone]

## 2020-05-28 ENCOUNTER — Inpatient Hospital Stay (HOSPITAL_COMMUNITY): Payer: Medicare Other | Admitting: Certified Registered Nurse Anesthetist

## 2020-05-28 ENCOUNTER — Encounter (HOSPITAL_COMMUNITY)
Admission: AD | Disposition: A | Discharge status: Home / Self Care | Payer: Self-pay | Source: Ambulatory Visit | Attending: Interventional Cardiology

## 2020-05-28 ENCOUNTER — Encounter (HOSPITAL_COMMUNITY): Payer: Self-pay | Admitting: Interventional Cardiology

## 2020-05-28 ENCOUNTER — Other Ambulatory Visit: Payer: Self-pay | Admitting: Cardiovascular Disease

## 2020-05-28 DIAGNOSIS — I5021 Acute systolic (congestive) heart failure: Secondary | ICD-10-CM | POA: Diagnosis not present

## 2020-05-28 DIAGNOSIS — I4819 Other persistent atrial fibrillation: Secondary | ICD-10-CM | POA: Diagnosis not present

## 2020-05-28 HISTORY — PX: CARDIOVERSION: SHX1299

## 2020-05-28 LAB — BASIC METABOLIC PANEL
Anion gap: 12 (ref 5–15)
BUN: 41 mg/dL — ABNORMAL HIGH (ref 8–23)
CO2: 26 mmol/L (ref 22–32)
Calcium: 8.9 mg/dL (ref 8.9–10.3)
Chloride: 99 mmol/L (ref 98–111)
Creatinine, Ser: 1.53 mg/dL — ABNORMAL HIGH (ref 0.61–1.24)
GFR, Estimated: 45 mL/min — ABNORMAL LOW (ref >60)
Glucose, Bld: 75 mg/dL (ref 70–99)
Potassium: 4.2 mmol/L (ref 3.5–5.1)
Sodium: 137 mmol/L (ref 135–145)

## 2020-05-28 LAB — GLUCOSE, CAPILLARY
Glucose-Capillary: 71 mg/dL (ref 70–99)
Glucose-Capillary: 148 mg/dL — ABNORMAL HIGH (ref 70–99)
Glucose-Capillary: 171 mg/dL — ABNORMAL HIGH (ref 70–99)
Glucose-Capillary: 105 mg/dL — ABNORMAL HIGH (ref 70–99)

## 2020-05-28 LAB — MAGNESIUM: Magnesium: 1.6 mg/dL — ABNORMAL LOW (ref 1.7–2.4)

## 2020-05-28 SURGERY — CARDIOVERSION
Anesthesia: General

## 2020-05-28 MED ORDER — LIDOCAINE 2% (20 MG/ML) 5 ML SYRINGE
INTRAMUSCULAR | Status: DC | PRN
  Administered 2020-05-28: 60 mg via INTRAVENOUS

## 2020-05-28 MED ORDER — ERYTHROMYCIN 5 MG/GM OP OINT
TOPICAL_OINTMENT | Freq: Four times a day (QID) | OPHTHALMIC | Status: DC
  Administered 2020-05-31 – 2020-06-02 (×4): 1 via OPHTHALMIC
  Filled 2020-05-28: qty 3.5

## 2020-05-28 MED ORDER — PHENYLEPHRINE HCL (PRESSORS) 10 MG/ML IV SOLN
INTRAVENOUS | Status: DC | PRN
  Administered 2020-05-28: 160 ug via INTRAVENOUS

## 2020-05-28 MED ORDER — MAGNESIUM SULFATE 2 GM/50ML IV SOLN
2.0000 g | Freq: Once | INTRAVENOUS | Status: AC
  Administered 2020-05-28: 2 g via INTRAVENOUS

## 2020-05-28 MED ORDER — SODIUM CHLORIDE 0.9 % IV SOLN: INTRAVENOUS | Status: DC | PRN

## 2020-05-28 MED ORDER — PROPOFOL 10 MG/ML IV BOLUS
INTRAVENOUS | Status: DC | PRN
  Administered 2020-05-28: 50 mg via INTRAVENOUS

## 2020-05-28 NOTE — Progress Notes (Signed)
Notified by pharmD re: Mg level 1.4 5/23. Will add repeat level to this AM's labs and replete if needed.

## 2020-05-28 NOTE — Interval H&P Note (Signed)
History and Physical Interval Note:  05/28/2020 7:50 AM  Wesley Winters  has presented today for surgery, with the diagnosis of Afib.  The various methods of treatment have been discussed with the patient and family. After consideration of risks, benefits and other options for treatment, the patient has consented to  Procedure(s): CARDIOVERSION (N/A) as a surgical intervention.  The patient's history has been reviewed, patient examined, no change in status, stable for surgery.  I have reviewed the patient's chart and labs.  Questions were answered to the patient's satisfaction.     [name], MD

## 2020-05-28 NOTE — Progress Notes (Signed)
DAILY PROGRESS NOTE   Patient Name: Wesley Winters Date of Encounter: 05/28/2020 Cardiologist: Sinclair Grooms, MD  Chief Complaint   Breathing mildly improved.  Patient Profile   Wesley Winters is a 80 y.o. male with a hx of primary hypertension, hyperlipidemia, OSA,CAD (stent to LAD and RCA in 1998 and 1999; patent coronary arteries 7867), acute systolic HF 67%, and AF unknown duration 04/23/2020.  Subjective   Net positive recorded overnight - however, weight down 1 Kg. Creatinine is stable with diuresis. BNP very high at 1609. CXR shows small effusions. COVID negative. Cool extremities - probably in low output heart failure.  Objective   Vitals:   05/28/20 0900 05/28/20 0915 05/28/20 0930 05/28/20 1015  BP: 96/61 (!) 77/61  100/63  Pulse: 83 81 80 70  Resp: (!) 21 18 17 18   Temp:      TempSrc:      SpO2:   96%   Weight:      Height:        Intake/Output Summary (Last 24 hours) at 05/28/2020 1115 Last data filed at 05/28/2020 0802 Gross per 24 hour  Intake 865 ml  Output 1875 ml  Net -1010 ml   Filed Weights   05/27/20 0356 05/28/20 0108 05/28/20 0700  Weight: 102.7 kg 101.8 kg 101.6 kg    Physical Exam   General appearance: alert, no distress, morbidly obese and pale Neck: JVD - 5 cm above sternal notch, no carotid bruit, supple, symmetrical, trachea midline and thyroid not enlarged, symmetric, no tenderness/mass/nodules Lungs: diminished breath sounds bilaterally Heart: irregularly irregular rhythm Abdomen: soft, non-tender; bowel sounds normal; no masses,  no organomegaly and obese Extremities: edema 2+ bilateral LE Edema Pulses: 1+ pulses Skin: pale, cool extremities, dry Neurologic: Grossly normal Psych: Pleasant  Inpatient Medications    Scheduled Meds: . amiodarone  200 mg Oral BID  . apixaban  5 mg Oral BID  . atorvastatin  40 mg Oral Daily  . carvedilol  3.125 mg Oral BID WC  . cholecalciferol  1,000 Units Oral QPM  . empagliflozin  10 mg  Oral Daily  . erythromycin   Both Eyes Q6H  . furosemide  40 mg Intravenous BID  . insulin aspart  0-9 Units Subcutaneous TID WC  . insulin glargine  13 Units Subcutaneous QPM  . loratadine  10 mg Oral Daily  . pantoprazole  20 mg Oral Daily  . potassium chloride  10 mEq Oral BID  . sodium chloride flush  3 mL Intravenous Q12H  . vitamin B-12  1,000 mcg Oral Daily    Continuous Infusions: . sodium chloride      PRN Meds: sodium chloride, acetaminophen, azelastine, fluticasone, guaiFENesin-dextromethorphan, ondansetron (ZOFRAN) IV, sodium chloride flush   Labs   Results for orders placed or performed during the hospital encounter of 05/26/20 (from the past 48 hour(s))  Glucose, capillary     Status: Abnormal   Collection Time: 05/26/20  2:09 PM  Result Value Ref Range   Glucose-Capillary 61 (L) 70 - 99 mg/dL    Comment: Glucose reference range applies only to samples taken after fasting for at least 8 hours.  Comprehensive metabolic panel     Status: Abnormal   Collection Time: 05/26/20  2:29 PM  Result Value Ref Range   Sodium 138 135 - 145 mmol/L   Potassium 4.6 3.5 - 5.1 mmol/L   Chloride 103 98 - 111 mmol/L   CO2 23 22 - 32 mmol/L  Glucose, Bld 75 70 - 99 mg/dL    Comment: Glucose reference range applies only to samples taken after fasting for at least 8 hours.   BUN 39 (H) 8 - 23 mg/dL   Creatinine, Ser 1.41 (H) 0.61 - 1.24 mg/dL   Calcium 8.9 8.9 - 10.3 mg/dL   Total Protein 7.0 6.5 - 8.1 g/dL   Albumin 3.2 (L) 3.5 - 5.0 g/dL   AST 29 15 - 41 U/L   ALT 27 0 - 44 U/L   Alkaline Phosphatase 97 38 - 126 U/L   Total Bilirubin 1.0 0.3 - 1.2 mg/dL   GFR, Estimated 50 (L) >60 mL/min    Comment: (NOTE) Calculated using the CKD-EPI Creatinine Equation (2021)    Anion gap 12 5 - 15    Comment: Performed at [facility] [address] [place], [place] [address]  CBC WITH DIFFERENTIAL     Status: Abnormal   Collection Time: 05/26/20  2:29 PM  Result Value Ref  Range   WBC 6.8 4.0 - 10.5 K/uL   RBC 4.31 4.22 - 5.81 MIL/uL   Hemoglobin 10.5 (L) 13.0 - 17.0 g/dL   HCT 35.1 (L) 39.0 - 52.0 %   MCV 81.4 80.0 - 100.0 fL   MCH 24.4 (L) 26.0 - 34.0 pg   MCHC 29.9 (L) 30.0 - 36.0 g/dL   RDW 18.4 (H) 11.5 - 15.5 %   Platelets 276 150 - 400 K/uL   nRBC 0.6 (H) 0.0 - 0.2 %   Neutrophils Relative % 73 %   Neutro Abs 4.9 1.7 - 7.7 K/uL   Lymphocytes Relative 15 %   Lymphs Abs 1.0 0.7 - 4.0 K/uL   Monocytes Relative 12 %   Monocytes Absolute 0.8 0.1 - 1.0 K/uL   Eosinophils Relative 0 %   Eosinophils Absolute 0.0 0.0 - 0.5 K/uL   Basophils Relative 0 %   Basophils Absolute 0.0 0.0 - 0.1 K/uL   Immature Granulocytes 0 %   Abs Immature Granulocytes 0.03 0.00 - 0.07 K/uL    Comment: Performed at [facility], [place] [address], [place], [place] [address]  Magnesium     Status: Abnormal   Collection Time: 05/26/20  2:29 PM  Result Value Ref Range   Magnesium 1.4 (L) 1.7 - 2.4 mg/dL    Comment: Performed at [facility] [address] [place], [place] [address]  Brain natriuretic peptide     Status: Abnormal   Collection Time: 05/26/20  2:29 PM  Result Value Ref Range   B Natriuretic Peptide 1,609.8 (H) 0.0 - 100.0 pg/mL    Comment: Performed at [facility] [address], [place], [place] [address]  TSH     Status: None   Collection Time: 05/26/20  2:29 PM  Result Value Ref Range   TSH 4.049 0.350 - 4.500 uIU/mL    Comment: Performed by a 3rd Generation assay with a functional sensitivity of <=0.01 uIU/mL. Performed at [facility], [place] [address] [place], [place] [address]   Hemoglobin A1c     Status: Abnormal   Collection Time: 05/26/20  2:29 PM  Result Value Ref Range   Hgb A1c MFr Bld 7.5 (H) 4.8 - 5.6 %    Comment: (NOTE) Pre diabetes:          5.7%-6.4%  Diabetes:              >6.4%  Glycemic control for   <  7.0% adults with diabetes    Mean Plasma Glucose 168.55 mg/dL    Comment: Performed at [facility], [place] [address] [place], [place] [address]  Glucose, capillary     Status: None   Collection Time: 05/26/20  4:04 PM  Result Value Ref Range   Glucose-Capillary 71 70 - 99 mg/dL    Comment: Glucose reference range applies only to samples taken after fasting for at least 8 hours.  Glucose, capillary     Status: None   Collection Time: 05/26/20  8:24 PM  Result Value Ref Range   Glucose-Capillary 70 70 - 99 mg/dL    Comment: Glucose reference range applies only to samples taken after fasting for at least 8 hours.  Glucose, capillary     Status: Abnormal   Collection Time: 05/26/20 10:03 PM  Result Value Ref Range   Glucose-Capillary 107 (H) 70 - 99 mg/dL    Comment: Glucose reference range applies only to samples taken after fasting for at least 8 hours.  SARS CORONAVIRUS 2 (TAT 6-24 HRS) Nasopharyngeal Nasopharyngeal Swab     Status: None   Collection Time: 05/26/20 10:19 PM   Specimen: Nasopharyngeal Swab  Result Value Ref Range   SARS Coronavirus 2 NEGATIVE NEGATIVE    Comment: (NOTE) SARS-CoV-2 target nucleic acids are NOT DETECTED.  The SARS-CoV-2 RNA is generally detectable in upper and lower respiratory specimens during the acute phase of infection. Negative results do not preclude SARS-CoV-2 infection, do not rule out co-infections with other pathogens, and should not be used as the sole basis for treatment or other patient management decisions. Negative results must be combined with clinical observations, patient history, and epidemiological information. The expected result is Negative.  Fact Sheet for Patients: SugarRoll.be  Fact Sheet for Healthcare Providers: https://www.woods-mathews.com/  This test is not yet approved or cleared by the [place] FDA and  has been authorized for detection and/or diagnosis of SARS-CoV-2 by FDA under an Emergency Use Authorization (EUA). This EUA will remain  in effect (meaning  this test can be used) for the duration of the COVID-19 declaration under Se ction 564(b)(1) of the Act, 21 U.S.C. section 360bbb-3(b)(1), unless the authorization is terminated or revoked sooner.  Performed at [facility], [place] [address] [place], [place] [address]   Basic metabolic panel     Status: Abnormal   Collection Time: 05/27/20  3:01 AM  Result Value Ref Range   Sodium 137 135 - 145 mmol/L   Potassium 4.1 3.5 - 5.1 mmol/L   Chloride 101 98 - 111 mmol/L   CO2 26 22 - 32 mmol/L   Glucose, Bld 99 70 - 99 mg/dL    Comment: Glucose reference range applies only to samples taken after fasting for at least 8 hours.   BUN 39 (H) 8 - 23 mg/dL   Creatinine, Ser 1.43 (H) 0.61 - 1.24 mg/dL   Calcium 8.7 (L) 8.9 - 10.3 mg/dL   GFR, Estimated 49 (L) >60 mL/min    Comment: (NOTE) Calculated using the CKD-EPI Creatinine Equation (2021)    Anion gap 10 5 - 15    Comment: Performed at [facility] [address], [place], [place] [address]  Glucose, capillary     Status: None   Collection Time: 05/27/20  5:52 AM  Result Value Ref Range   Glucose-Capillary 94 70 - 99 mg/dL    Comment: Glucose reference range applies only to samples taken after fasting for  at least 8 hours.  Glucose, capillary     Status: Abnormal   Collection Time: 05/27/20 10:54 AM  Result Value Ref Range   Glucose-Capillary 147 (H) 70 - 99 mg/dL    Comment: Glucose reference range applies only to samples taken after fasting for at least 8 hours.  Glucose, capillary     Status: Abnormal   Collection Time: 05/27/20  4:57 PM  Result Value Ref Range   Glucose-Capillary 156 (H) 70 - 99 mg/dL    Comment: Glucose reference range applies only to samples taken after fasting for at least 8 hours.  Protime-INR     Status: Abnormal   Collection Time: 05/27/20  8:45 PM  Result Value Ref Range   Prothrombin Time 25.3 (H) 11.4 - 15.2 seconds   INR 2.3 (H) 0.8 - 1.2    Comment: (NOTE) INR goal varies based on  device and disease states. Performed at [facility], [place] [address] [place], [place] [address]   Glucose, capillary     Status: Abnormal   Collection Time: 05/27/20  9:06 PM  Result Value Ref Range   Glucose-Capillary 155 (H) 70 - 99 mg/dL    Comment: Glucose reference range applies only to samples taken after fasting for at least 8 hours.  Basic metabolic panel     Status: Abnormal   Collection Time: 05/28/20  2:05 AM  Result Value Ref Range   Sodium 137 135 - 145 mmol/L   Potassium 4.2 3.5 - 5.1 mmol/L   Chloride 99 98 - 111 mmol/L   CO2 26 22 - 32 mmol/L   Glucose, Bld 75 70 - 99 mg/dL    Comment: Glucose reference range applies only to samples taken after fasting for at least 8 hours.   BUN 41 (H) 8 - 23 mg/dL   Creatinine, Ser 1.53 (H) 0.61 - 1.24 mg/dL   Calcium 8.9 8.9 - 10.3 mg/dL   GFR, Estimated 45 (L) >60 mL/min    Comment: (NOTE) Calculated using the CKD-EPI Creatinine Equation (2021)    Anion gap 12 5 - 15    Comment: Performed at [facility] [address], [place], [place] [address]  Glucose, capillary     Status: None   Collection Time: 05/28/20  5:58 AM  Result Value Ref Range   Glucose-Capillary 71 70 - 99 mg/dL    Comment: Glucose reference range applies only to samples taken after fasting for at least 8 hours.    ECG   N/A  Telemetry   Afib with RVR - Personally Reviewed  Radiology    DG Chest 2 View  Result Date: 05/27/2020 CLINICAL DATA:  Shortness of breath EXAM: CHEST - 2 VIEW COMPARISON:  07/26/2019 FINDINGS: Cardiac shadow is prominent but accentuated by the portable technique. Aortic calcifications are again noted and stable. The lungs are well aerated bilaterally. Small posterior effusions are noted best seen on the lateral projection. No bony abnormality is seen. IMPRESSION: Small effusions without focal infiltrate. No other focal abnormality is noted. Electronically Signed   By: Inez Catalina M.D.   On: 05/27/2020 08:18     Cardiac Studies   N/A  Assessment   Active Problems:   Diabetes ([facility])   CAD in native artery   Hyperlipidemia LDL goal <09   Acute systolic heart failure (HCC)   Persistent atrial fibrillation (HCC)   Chronic anticoagulation   Plan   1. Acute decompensated systolic congestive heart failure -Mr. Bannister appears to  be in low output heart failure with cool extremities and elevated creatinine with minimal response to diuretics.  Has now put out 1L overnight - small increase in creatinine from 1.43 to 1.53. Still volume overloaded. Continue BID lasix IV. 2. Persistent atrial fibrillation -successful DCCV this morning to sinus - maintaining. Continue anticoagulation on Eliquis - INR 1.5, ?metabolic. 3. CAD -no active anginal symptoms. 4. Dyslipidemia -continue atorvastatin. 5. DM2 -on insulin and metformin.  A1C 7.5.  Started Jardiance (minimal side effects in the past, this is covered by her insurance). 6. OSA on CPAP - wearing home mask 7. Cough - suspect CHF, may be a viral bronchitis - CXR does not suggest pneumonia, but he has had some sputum - on Robitussin, no leukocytosis or fever  Time Spent Directly with Patient:  I have spent a total of 25 minutes with the patient reviewing hospital notes, telemetry, EKGs, labs and examining the patient as well as establishing an assessment and plan that was discussed personally with the patient.  > 50% of time was spent in direct patient care.  Length of Stay:  LOS: 2 days   Pixie Casino, MD, [facility], [place] Director of the Advanced Lipid Disorders &  Cardiovascular Risk Reduction Clinic Diplomate of the American Board of Clinical Lipidology Attending Cardiologist  Direct Dial: [phone]  Fax: [phone]  Website:  [email or web address] Osgood Kimley Apsey 05/28/2020, 11:15 AM

## 2020-05-28 NOTE — Transfer of Care (Signed)
Immediate Anesthesia Transfer of Care Note  Patient: Wesley Winters  Procedure(s) Performed: CARDIOVERSION (N/A )  Patient Location: Endoscopy Unit  Anesthesia Type:General  Level of Consciousness: awake, patient cooperative and responds to stimulation  Airway & Oxygen Therapy: Patient Spontanous Breathing and Patient connected to nasal cannula oxygen  Post-op Assessment: Report given to RN and Post -op Vital signs reviewed and stable  Post vital signs: Reviewed and stable  Last Vitals:  Vitals Value Taken Time  BP    Temp    Pulse    Resp    SpO2      Last Pain:  Vitals:   05/28/20 0700  TempSrc: Temporal  PainSc: 0-No pain      Patients Stated Pain Goal: 0 (89/02/28 4069)  Complications: No complications documented.

## 2020-05-28 NOTE — CV Procedure (Signed)
Electrical Cardioversion Procedure Note Wesley Winters [id number] [birth year]  Procedure: Electrical Cardioversion Indications:  Atrial Fibrillation  Procedure Details Consent: Risks of procedure as well as the alternatives and risks of each were explained to the (patient/caregiver).  Consent for procedure obtained. Time Out: Verified patient identification, verified procedure, site/side was marked, verified correct patient position, special equipment/implants available, medications/allergies/relevent history reviewed, required imaging and test results available.  Performed  Patient placed on cardiac monitor, pulse oximetry, supplemental oxygen as necessary.  Sedation given: propofol Pacer pads placed anterior and posterior chest.  Cardioverted 1 time(s).  Cardioverted at [place].  Evaluation Findings: Post procedure EKG shows: sinus rhythm with PVCs Complications: None Patient did tolerate procedure well.   Wesley Latch, MD 05/28/2020, 7:57 AM

## 2020-05-28 NOTE — Anesthesia Preprocedure Evaluation (Addendum)
Anesthesia Evaluation  Patient identified by MRN, date of birth, ID band Patient awake    Reviewed: Allergy & Precautions, NPO status , Patient's Chart, lab work & pertinent test results, reviewed documented beta blocker date and time   Airway Mallampati: III  TM Distance: >3 FB Neck ROM: Full    Dental  (+) Teeth Intact, Dental Advisory Given   Pulmonary sleep apnea and Continuous Positive Airway Pressure Ventilation ,    Pulmonary exam normal breath sounds clear to auscultation       Cardiovascular hypertension, Pt. on home beta blockers + CAD and + Cardiac Stents  + dysrhythmias Atrial Fibrillation  Rhythm:Irregular Rate:Abnormal     Neuro/Psych PSYCHIATRIC DISORDERS Depression negative neurological ROS     GI/Hepatic Neg liver ROS, GERD  Medicated,  Endo/Other  diabetes, Type 2, Insulin Dependent, Oral Hypoglycemic AgentsObesity   Renal/GU negative Renal ROS   Prostate cancer     Musculoskeletal negative musculoskeletal ROS (+)   Abdominal   Peds  Hematology  (+) Blood dyscrasia (Eliquis), ,   Anesthesia Other Findings Day of surgery medications reviewed with the patient.  Reproductive/Obstetrics                            Anesthesia Physical Anesthesia Plan  ASA: III  Anesthesia Plan: General   Post-op Pain Management:    Induction: Intravenous  PONV Risk Score and Plan: 2 and Propofol infusion and Treatment may vary due to age or medical condition  Airway Management Planned: Natural Airway and Mask  Additional Equipment:   Intra-op Plan:   Post-operative Plan:   Informed Consent: I have reviewed the patients History and Physical, chart, labs and discussed the procedure including the risks, benefits and alternatives for the proposed anesthesia with the patient or authorized representative who has indicated his/her understanding and acceptance.     Dental advisory  given  Plan Discussed with: CRNA  Anesthesia Plan Comments:        Anesthesia Quick Evaluation

## 2020-05-28 NOTE — Progress Notes (Signed)
Heart Failure Stewardship Pharmacist Progress Note   PCP: [name], MD PCP-Cardiologist: Sinclair Grooms, MD    HPI:  80 yo male with PMH HTN, HLD, OSA, CAD s/p PCI to LAD and RCA in 1998 and 1607, acute systolic HF, and AF of unknown duration on 04/23/20. Recently diagnosed with new systolic HF with most recent ECHO on 05/05/20 with LVEF 20-25% with global hypokinesis which is down from 45-50% in 2020. Admitted with orthopnea, cough, dyspnea on exertion, and 10 lb weight gain. BNP elevated on admit. Started on IV diuresis. He reports that he has an intolerance to Jardiance (jittery, anxious) in the past but is willing to retry medication while in the hospital. He thinks it might not be medication related. Now s/p successful DCCV to NSR this morning.  Current HF Medications: IV furosemide 40 mg BID Carvedilol 3.125 mg BID Jardiance 10 mg daily KCl 10 mEq BID  Prior to admission HF Medications: PO furosemide 20 mg daily Carvedilol 3.125 mg BID Benazepril 20 mg daily KCl 10 mEq BID   Pertinent Lab Values: . Serum creatinine 1.43>1.53, BUN 41, Potassium 4.2, Sodium 137 . 05/26/20: BNP 1609, Magnesium 1.4  Vital Signs: . Weight: 224 lbs (admission weight: 227.9 lbs) . Blood pressure: 100/70s  . Heart rate: 60s in NSR post-DCCV  Medication Assistance / Insurance Benefits Check: Does the patient have prescription insurance?  Yes Type of insurance plan: BCBS Medicare  Does the patient qualify for medication assistance through manufacturers or grants?   Pending . Eligible grants and/or patient assistance programs: Pending . Medication assistance applications in progress: None  . Medication assistance applications approved: None Approved medication assistance renewals will be completed by: Pending  Outpatient Pharmacy:  Prior to admission outpatient pharmacy: [facility] Is the patient willing to use [facility] at discharge? Yes Is the patient willing to transition their  outpatient pharmacy to utilize a [facility] outpatient pharmacy?   Pending    Assessment: 1. Acute on chronic systolic CHF (EF 37-10%), due to ICM. NYHA class II symptoms. - Adequate response to diuretics with 2L UOP/24 hrs, weight down 2 lbs, slight transient bump in Scr - agree with continuing IV furosemide 40 mg BID for now - Continue carvedilol 3.125 mg BID - Continue Jardiance 10 mg daily - BP too soft for ARB or ARNI at this time - Consider starting spironolactone 12.5 mg daily prior to discharge pending BP    Plan: 1) Medication changes recommended at this time: - Continue current medication regimen  2) Patient assistance: - Jardiance copay $10/month - Farxiga not on formulary  3)  Education  - To be completed prior to discharge  Richardine Service, PharmD, BCPS Kerby Nora, PharmD, BCPS Heart Failure Cytogeneticist Phone [phone]

## 2020-05-29 ENCOUNTER — Encounter (HOSPITAL_COMMUNITY): Payer: Self-pay | Admitting: Cardiovascular Disease

## 2020-05-29 DIAGNOSIS — I4819 Other persistent atrial fibrillation: Secondary | ICD-10-CM | POA: Diagnosis not present

## 2020-05-29 DIAGNOSIS — I5021 Acute systolic (congestive) heart failure: Secondary | ICD-10-CM | POA: Diagnosis not present

## 2020-05-29 LAB — BASIC METABOLIC PANEL
Anion gap: 12 (ref 5–15)
BUN: 40 mg/dL — ABNORMAL HIGH (ref 8–23)
CO2: 25 mmol/L (ref 22–32)
Calcium: 8.6 mg/dL — ABNORMAL LOW (ref 8.9–10.3)
Chloride: 99 mmol/L (ref 98–111)
Creatinine, Ser: 1.48 mg/dL — ABNORMAL HIGH (ref 0.61–1.24)
GFR, Estimated: 47 mL/min — ABNORMAL LOW (ref >60)
Glucose, Bld: 57 mg/dL — ABNORMAL LOW (ref 70–99)
Potassium: 3.6 mmol/L (ref 3.5–5.1)
Sodium: 136 mmol/L (ref 135–145)

## 2020-05-29 LAB — GLUCOSE, CAPILLARY
Glucose-Capillary: 60 mg/dL — ABNORMAL LOW (ref 70–99)
Glucose-Capillary: 63 mg/dL — ABNORMAL LOW (ref 70–99)
Glucose-Capillary: 252 mg/dL — ABNORMAL HIGH (ref 70–99)
Glucose-Capillary: 319 mg/dL — ABNORMAL HIGH (ref 70–99)
Glucose-Capillary: 131 mg/dL — ABNORMAL HIGH (ref 70–99)
Glucose-Capillary: 117 mg/dL — ABNORMAL HIGH (ref 70–99)

## 2020-05-29 LAB — MAGNESIUM: Magnesium: 2 mg/dL (ref 1.7–2.4)

## 2020-05-29 MED ORDER — SPIRONOLACTONE 12.5 MG HALF TABLET
12.5000 mg | ORAL_TABLET | Freq: Every day | ORAL | Status: DC
  Administered 2020-05-29 – 2020-06-03 (×6): 12.5 mg via ORAL
  Filled 2020-05-29 (×6): qty 1

## 2020-05-29 MED ORDER — INSULIN GLARGINE 100 UNIT/ML ~~LOC~~ SOLN
10.0000 [IU] | Freq: Every evening | SUBCUTANEOUS | Status: DC
  Administered 2020-05-29 – 2020-06-02 (×5): 10 [IU] via SUBCUTANEOUS
  Filled 2020-05-29 (×6): qty 0.1

## 2020-05-29 NOTE — Progress Notes (Addendum)
Progress Note  Patient Name: Wesley Winters Date of Encounter: 05/29/2020  [facility] Cardiologist: Sinclair Grooms, MD   Subjective   Patient is wearing the CPAP currently. He states his leg swelling is improved somewhat. He is urinating a little more than usual. He continue to feel SOB with exertion. He denied any chest pain, endorses burning sensation consistent with his GERD.   Inpatient Medications    Scheduled Meds: . amiodarone  200 mg Oral BID  . apixaban  5 mg Oral BID  . atorvastatin  40 mg Oral Daily  . carvedilol  3.125 mg Oral BID WC  . cholecalciferol  1,000 Units Oral QPM  . empagliflozin  10 mg Oral Daily  . erythromycin   Both Eyes Q6H  . furosemide  40 mg Intravenous BID  . insulin aspart  0-9 Units Subcutaneous TID WC  . insulin glargine  13 Units Subcutaneous QPM  . loratadine  10 mg Oral Daily  . pantoprazole  20 mg Oral Daily  . potassium chloride  10 mEq Oral BID  . sodium chloride flush  3 mL Intravenous Q12H  . vitamin B-12  1,000 mcg Oral Daily   Continuous Infusions: . sodium chloride     PRN Meds: sodium chloride, acetaminophen, azelastine, fluticasone, guaiFENesin-dextromethorphan, ondansetron (ZOFRAN) IV, sodium chloride flush   Vital Signs    Vitals:   05/28/20 2316 05/28/20 2344 05/29/20 0436 05/29/20 0728  BP:  109/75 116/69 122/80  Pulse: 72 65 62 72  Resp: 19 18 12 18   Temp:  98 F (36.7 C) 97.8 F (36.6 C) 97.6 F (36.4 C)  TempSrc:  Oral Axillary Oral  SpO2: 97% 92% 97% 97%  Weight:   99.2 kg   Height:        Intake/Output Summary (Last 24 hours) at 05/29/2020 0854 Last data filed at 05/29/2020 0815 Gross per 24 hour  Intake 610 ml  Output 850 ml  Net -240 ml   Last 3 Weights 05/29/2020 05/28/2020 05/28/2020  Weight (lbs) 218 lb 12.8 oz 224 lb 224 lb 8 oz  Weight (kg) 99.247 kg 101.606 kg 101.833 kg      Telemetry    Sinus rhythm with rate of 70-80s, PACs and PVCs, 6 beats NSVT noted overnight, artifacts -  Personally Reviewed  ECG    EKG from 05/28/20 with SR with rate of 69 bpm, PACs, IVCD   - Personally Reviewed  Physical Exam   GEN: No acute distress.   Neck: JVD low neck with HOB 45 degree  Cardiac: RRR, no murmurs, rubs, or gallops.  Respiratory: Clear to auscultation bilaterally.Wearing CPAP. Speaks full sentence  GI: Abdomen obese. Soft, nontender, non-distended  MS: BLE 1- edema; No deformity. Neuro:  Nonfocal  Psych: Normal affect   Labs    High Sensitivity Troponin:  No results for input(s): TROPONINIHS in the last 720 hours.    Chemistry Recent Labs  Lab 05/26/20 1429 05/27/20 0301 05/28/20 0205 05/29/20 0251  NA 138 137 137 136  K 4.6 4.1 4.2 3.6  CL 103 101 99 99  CO2 23 26 26 25   GLUCOSE 75 99 75 57*  BUN 39* 39* 41* 40*  CREATININE 1.41* 1.43* 1.53* 1.48*  CALCIUM 8.9 8.7* 8.9 8.6*  PROT 7.0  --   --   --   ALBUMIN 3.2*  --   --   --   AST 29  --   --   --   ALT 27  --   --   --  ALKPHOS 97  --   --   --   BILITOT 1.0  --   --   --   GFRNONAA 50* 49* 45* 47*  ANIONGAP 12 10 12 12      Hematology Recent Labs  Lab 05/26/20 1429  WBC 6.8  RBC 4.31  HGB 10.5*  HCT 35.1*  MCV 81.4  MCH 24.4*  MCHC 29.9*  RDW 18.4*  PLT 276    BNP Recent Labs  Lab 05/26/20 1429  BNP 1,609.8*     DDimer No results for input(s): DDIMER in the last 168 hours.   Radiology    No results found.  Cardiac Studies   Long term monitor 05/26/20:  4 Ventricular Tachycardia runs occurred, the run with the fastest interval lasting 5 beats with a max rate of 182 bpm, the longest lasting 15 beats with an avg rate of 140 bpm. Atrial Fibrillation occurred continuously (100% burden), ranging from 46-172  bpm (avg of 84 bpm). QRS morphology changes were present throughout recording. Isolated VEs were occasional (4.0%, 42504), VE Couplets were rare (<1.0%, 4936), and VE Triplets were rare (<1.0%, 1425). Ventricular Bigeminy and Trigeminy were present.  Echo from  05/05/20:  1. Left ventricular ejection fraction, by estimation, is 20 to 25%. The  left ventricle has severely decreased function. The left ventricle  demonstrates global hypokinesis. The left ventricular internal cavity size  was moderately dilated. Left  ventricular diastolic parameters are indeterminate. Elevated left  ventricular end-diastolic pressure.  2. Right ventricular systolic function is moderately reduced. The right  ventricular size is normal. There is mildly elevated pulmonary artery  systolic pressure.  3. Left atrial size was severely dilated.  4. Right atrial size was mildly dilated.  5. The mitral valve is normal in structure. Mild mitral valve  regurgitation. No evidence of mitral stenosis.  6. The aortic valve is normal in structure. Aortic valve regurgitation is  not visualized. No aortic stenosis is present.  7. The inferior vena cava is normal in size with greater than 50%  respiratory variability, suggesting right atrial pressure of 3 mmHg.    Patient Profile     80 y.o. male with PMH of hypertension, hyperlipidemia, OSA, CAD ( stent to LAD and RCA in 1998 and 1999, cardiac cath 2005 with patent coronaries), who is admitted for acute decompensated systolic heart failure. He was diagnosed with A fib on 04/23/20 outpatient.   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 accuracy) since admission - renal index elevated with diuresis , Cr 1.41 >1.43 >1.53 >1.48 - clinically remains hypervolemic  - BP stable, will continue IV Lasix 40mg  BID  - continue GDMT with  Coreg, jardiance, may add Entresto before DC if renal function stable/improving (home benazepril has been held)  Persistent A fib  NSVT - recently found in new A fib on 04/23/20  - rate 100s at admission while on amiodarone 200mg  BID and coreg 3.125mg  BID  - s/p DCCV with successful conversion to SR on 05/28/20, remains in SR currently, continue coreg and amiodarone until office  follow up  - CHA2DS2VASc score is 5 due to age, CHF, DM, CAD; continue anticoagulation with Eliquis, not yet meeting criteria for reduced Eliquis dosing, monitor Cr daily   AKI on CKD stage II - Cr baseline around 1.1 - 1.41 POA, up to 1.53 and now 1.48 with IV diuresis, ? new baseline  - renal index appear stable, continue IV diuresis until euvolemic  - monitor renal index and UOP daily   CAD  - denied chest pain - cardiac cath 2005 with patent coronary  - continue statin, BB, no longer on ASA while on Eliquis   HTN - BP normotensive  - continue Coreg, lasix as above, home benazepril has been held  HLD - continue statin , no recent lipid, will add   Type 2 DM with hypoglycemia  - A1C 7.5%, fair control  - glucose 63 early morning today, will reduce Lantus from 13 to 10 unit QHS, continue Novolog SSI and jardiance - home regimen Lantus 25 unit QHS, metformin 1000mg  BID, and Toujeo 25 unit qHS held )  [facility] diet   OSA - CPAP at night     For questions or updates, please contact [facility] Please consult [email or web address] for contact info under        Signed, Margie Billet, NP  05/29/2020, 8:54 AM

## 2020-05-29 NOTE — Progress Notes (Addendum)
Heart Failure Stewardship Pharmacist Progress Note   PCP: [name], MD PCP-Cardiologist: Sinclair Grooms, MD    HPI:  80 yo male with PMH HTN, HLD, OSA, CAD s/p PCI to LAD and RCA in 1998 and 0762, acute systolic HF, and AF of unknown duration on 04/23/20. Recently diagnosed with new systolic HF with most recent ECHO on 05/05/20 with LVEF 20-25% with global hypokinesis which is down from 45-50% in 2020. Admitted with orthopnea, cough, dyspnea on exertion, and 10 lb weight gain. BNP elevated on admit. Started on IV diuresis. He reports that he has an intolerance to Jardiance (jittery, anxious) in the past but is willing to retry medication while in the hospital. He thinks it might not be medication related. S/p successful DCCV to NSR on 05/28/20.  Current HF Medications: IV furosemide 40 mg BID Carvedilol 3.125 mg BID Jardiance 10 mg daily KCl 10 mEq BID  Prior to admission HF Medications: PO furosemide 20 mg daily Carvedilol 3.125 mg BID Benazepril 20 mg daily KCl 10 mEq BID   Pertinent Lab Values: . Serum creatinine 1.48, BUN 40, Potassium 3.6, Sodium 136, Magnesium 2.0 . 05/26/20: BNP 1609  Vital Signs: . Weight: 224 lbs (admission weight: 227.9 lbs) . Blood pressure: 100-120s/70s  . Heart rate: 60s   Medication Assistance / Insurance Benefits Check: Does the patient have prescription insurance?  Yes Type of insurance plan: BCBS Medicare  Does the patient qualify for medication assistance through manufacturers or grants?   Pending . Eligible grants and/or patient assistance programs: Pending . Medication assistance applications in progress: None  . Medication assistance applications approved: None Approved medication assistance renewals will be completed by: Pending  Outpatient Pharmacy:  Prior to admission outpatient pharmacy: [facility] Is the patient willing to use [place] at discharge? Yes Is the patient willing to transition their outpatient pharmacy to  utilize a [facility] outpatient pharmacy?   Pending    Assessment: 1. Acute on chronic systolic CHF (EF 26-33%), due to ICM. NYHA class II symptoms. - Adequate response to diuretics (I/Os inaccurate), weight down 6 lbs - continue IV furosemide 40 mg BID for now and consider transition to oral diuretic tomorrow. Potassium replaced. - Continue carvedilol 3.125 mg BID - Continue Jardiance 10 mg daily - BP stable and starting to improve. Can consider starting Entresto 24/26 mg BID at discharge - Consider starting spironolactone 12.5 mg daily    Plan: 1) Medication changes recommended at this time: - Add spironolactone 12.5 mg daily  2) Patient assistance: - Jardiance copay $10/month - Farxiga not on formulary  3)  Education  - To be completed prior to discharge  Richardine Service, PharmD, BCPS Kerby Nora, PharmD, BCPS Heart Failure Cytogeneticist Phone [phone]

## 2020-05-29 NOTE — Plan of Care (Signed)

## 2020-05-29 NOTE — Anesthesia Postprocedure Evaluation (Signed)
Anesthesia Post Note  Patient: SATVIK PARCO  Procedure(s) Performed: CARDIOVERSION (N/A )     Patient location during evaluation: Endoscopy Anesthesia Type: General Level of consciousness: awake and alert Pain management: pain level controlled Vital Signs Assessment: post-procedure vital signs reviewed and stable Respiratory status: spontaneous breathing, nonlabored ventilation, respiratory function stable and patient connected to nasal cannula oxygen Cardiovascular status: blood pressure returned to baseline and stable Postop Assessment: no apparent nausea or vomiting Anesthetic complications: no   No complications documented.  Last Vitals:  Vitals:   05/29/20 0436 05/29/20 0728  BP: 116/69 122/80  Pulse: 62 72  Resp: 12 18  Temp: 36.6 C 36.4 C  SpO2: 97% 97%    Last Pain:  Vitals:   05/29/20 0824  TempSrc:   PainSc: 5    Pain Goal: Patients Stated Pain Goal: 0 (05/27/20 0854)                 Catalina Gravel

## 2020-05-29 NOTE — Progress Notes (Signed)
   05/29/20 1443  Mobility  Activity Refused mobility (Pt declined for unspecified reasons)   Attempt x2; will check back as time permits

## 2020-05-30 ENCOUNTER — Inpatient Hospital Stay (HOSPITAL_COMMUNITY): Payer: Medicare Other

## 2020-05-30 DIAGNOSIS — N179 Acute kidney failure, unspecified: Secondary | ICD-10-CM

## 2020-05-30 DIAGNOSIS — I5021 Acute systolic (congestive) heart failure: Secondary | ICD-10-CM | POA: Diagnosis not present

## 2020-05-30 DIAGNOSIS — Z7901 Long term (current) use of anticoagulants: Secondary | ICD-10-CM | POA: Diagnosis not present

## 2020-05-30 DIAGNOSIS — I4819 Other persistent atrial fibrillation: Secondary | ICD-10-CM | POA: Diagnosis not present

## 2020-05-30 LAB — GLUCOSE, CAPILLARY
Glucose-Capillary: 101 mg/dL — ABNORMAL HIGH (ref 70–99)
Glucose-Capillary: 112 mg/dL — ABNORMAL HIGH (ref 70–99)
Glucose-Capillary: 192 mg/dL — ABNORMAL HIGH (ref 70–99)
Glucose-Capillary: 152 mg/dL — ABNORMAL HIGH (ref 70–99)
Glucose-Capillary: 144 mg/dL — ABNORMAL HIGH (ref 70–99)

## 2020-05-30 LAB — BASIC METABOLIC PANEL
Anion gap: 9 (ref 5–15)
BUN: 40 mg/dL — ABNORMAL HIGH (ref 8–23)
CO2: 28 mmol/L (ref 22–32)
Calcium: 8.3 mg/dL — ABNORMAL LOW (ref 8.9–10.3)
Chloride: 98 mmol/L (ref 98–111)
Creatinine, Ser: 1.61 mg/dL — ABNORMAL HIGH (ref 0.61–1.24)
GFR, Estimated: 42 mL/min — ABNORMAL LOW (ref >60)
Glucose, Bld: 104 mg/dL — ABNORMAL HIGH (ref 70–99)
Potassium: 4.2 mmol/L (ref 3.5–5.1)
Sodium: 135 mmol/L (ref 135–145)

## 2020-05-30 LAB — MAGNESIUM: Magnesium: 1.9 mg/dL (ref 1.7–2.4)

## 2020-05-30 MED ORDER — APIXABAN 5 MG PO TABS
5.0000 mg | ORAL_TABLET | Freq: Two times a day (BID) | ORAL | Status: DC
  Administered 2020-05-30 – 2020-06-03 (×8): 5 mg via ORAL
  Filled 2020-05-30 (×8): qty 1

## 2020-05-30 MED ORDER — APIXABAN 2.5 MG PO TABS: 2.5000 mg | ORAL_TABLET | Freq: Two times a day (BID) | ORAL | Status: DC

## 2020-05-30 NOTE — Discharge Instructions (Addendum)
  Heart Healthy Diabetic Diet low salt.  Weigh daily and record on paper to bring with you to office to see pattern.   Weight every morning with just underwear or pajamas. Call our office (Dr. Thompson Caul office) for weight increase of 3 pounds in a day or 5 pounds in a week.    Follow meds on your discharge instructions. Thanks.    Information on my medicine - ELIQUIS (apixaban)  Why was Eliquis prescribed for you? Eliquis was prescribed for you to reduce the risk of a blood clot forming that can cause a stroke if you have a medical condition called atrial fibrillation (a type of irregular heartbeat).  What do You need to know about Eliquis ? Take your Eliquis TWICE DAILY - one tablet in the morning and one tablet in the evening with or without food. If you have difficulty swallowing the tablet whole please discuss with your pharmacist how to take the medication safely.  Take Eliquis exactly as prescribed by your doctor and DO NOT stop taking Eliquis without talking to the doctor who prescribed the medication.  Stopping may increase your risk of developing a stroke.  Refill your prescription before you run out.  After discharge, you should have regular check-up appointments with your healthcare provider that is prescribing your Eliquis.  In the future your dose may need to be changed if your kidney function or weight changes by a significant amount or as you get older.  What do you do if you miss a dose? If you miss a dose, take it as soon as you remember on the same day and resume taking twice daily.  Do not take more than one dose of ELIQUIS at the same time to make up a missed dose.  Important Safety Information A possible side effect of Eliquis is bleeding. You should call your healthcare provider right away if you experience any of the following: ? Bleeding from an injury or your nose that does not stop. ? Unusual colored urine (red or dark brown) or unusual colored stools (red  or black). ? Unusual bruising for unknown reasons. ? A serious fall or if you hit your head (even if there is no bleeding).  Some medicines may interact with Eliquis and might increase your risk of bleeding or clotting while on Eliquis. To help avoid this, consult your healthcare provider or pharmacist prior to using any new prescription or non-prescription medications, including herbals, vitamins, non-steroidal anti-inflammatory drugs (NSAIDs) and supplements.  This website has more information on Eliquis (apixaban): http://www.eliquis.com/eliquis/home

## 2020-05-30 NOTE — Progress Notes (Signed)
Placed patient on CPAP for the night.  

## 2020-05-30 NOTE — Progress Notes (Signed)
Heart Failure Stewardship Pharmacist Progress Note   PCP: [name], MD PCP-Cardiologist: Sinclair Grooms, MD    HPI:  80 yo male with PMH HTN, HLD, OSA, CAD s/p PCI to LAD and RCA in 1998 and 4680, acute systolic HF, and AF of unknown duration on 04/23/20. Recently diagnosed with new systolic HF with most recent ECHO on 05/05/20 with LVEF 20-25% with global hypokinesis which is down from 45-50% in 2020. Admitted with orthopnea, cough, dyspnea on exertion, and 10 lb weight gain. BNP elevated on admit. Started on IV diuresis. He reports that he has an intolerance to Jardiance (jittery, anxious) in the past but is willing to retry medication while in the hospital. He thinks it might not be medication related. S/p successful DCCV to NSR on 05/28/20.  Current HF Medications: IV furosemide 40 mg BID Carvedilol 3.125 mg BID Spironolactone 12.5 mg daily Jardiance 10 mg daily KCl 10 mEq BID  Prior to admission HF Medications: PO furosemide 20 mg daily Carvedilol 3.125 mg BID Benazepril 20 mg daily KCl 10 mEq BID   Pertinent Lab Values: . Serum creatinine 1.61, BUN 40, Potassium 4.2, Sodium 135, Magnesium 1.9 . 05/26/20: BNP 1609  Vital Signs: . Weight: 223 lbs (admission weight: 227.9 lbs) . Blood pressure: 120s/60s  . Heart rate: 60s   Medication Assistance / Insurance Benefits Check: Does the patient have prescription insurance?  Yes Type of insurance plan: BCBS Medicare  Does the patient qualify for medication assistance through manufacturers or grants?   Pending . Eligible grants and/or patient assistance programs: Pending . Medication assistance applications in progress: None  . Medication assistance applications approved: None Approved medication assistance renewals will be completed by: Pending  Outpatient Pharmacy:  Prior to admission outpatient pharmacy: [facility] Is the patient willing to use [place] at discharge? Yes Is the patient willing to transition their  outpatient pharmacy to utilize a [facility] outpatient pharmacy?   Pending    Assessment: 1. Acute on chronic systolic CHF (EF 32-12%), due to ICM. NYHA class II symptoms. - Adequate response to diuretics (I/Os inaccurate), but weight up 5 lbs and 1+ bilateral LEE per NP exam - continue IV furosemide 40 mg BID for now - Continue carvedilol 3.125 mg BID - Continue Jardiance 10 mg daily - BP stable, Scr bumped slightly. Can consider starting Entresto 24/26 mg BID at discharge - Continue spironolactone 12.5 mg daily    Plan: 1) Medication changes recommended at this time: - Continue current medications  2) Patient assistance: - Jardiance copay $10/month - Farxiga not on formulary  3)  Education  - To be completed prior to discharge  Richardine Service, PharmD, BCPS Kerby Nora, PharmD, BCPS Heart Failure Cytogeneticist Phone [phone]

## 2020-05-30 NOTE — Progress Notes (Addendum)
Progress Note  Patient Name: Wesley Winters Date of Encounter: 05/30/2020  [facility] Cardiologist: Sinclair Grooms, MD   Subjective   Patient states he is feeling slightly better, just went to the bathroom, feels a little SOB. He states his leg are still swollen, maybe slightly improved.   Inpatient Medications    Scheduled Meds: . amiodarone  200 mg Oral BID  . apixaban  5 mg Oral BID  . atorvastatin  40 mg Oral Daily  . carvedilol  3.125 mg Oral BID WC  . cholecalciferol  1,000 Units Oral QPM  . empagliflozin  10 mg Oral Daily  . erythromycin   Both Eyes Q6H  . furosemide  40 mg Intravenous BID  . insulin aspart  0-9 Units Subcutaneous TID WC  . insulin glargine  10 Units Subcutaneous QPM  . loratadine  10 mg Oral Daily  . pantoprazole  20 mg Oral Daily  . potassium chloride  10 mEq Oral BID  . sodium chloride flush  3 mL Intravenous Q12H  . spironolactone  12.5 mg Oral Daily  . vitamin B-12  1,000 mcg Oral Daily   Continuous Infusions: . sodium chloride     PRN Meds: sodium chloride, acetaminophen, azelastine, fluticasone, guaiFENesin-dextromethorphan, ondansetron (ZOFRAN) IV, sodium chloride flush   Vital Signs    Vitals:   05/30/20 0010 05/30/20 0356 05/30/20 0700 05/30/20 0922  BP: 110/70 129/69 121/67 101/67  Pulse:  72 78 74  Resp: 20 20 16    Temp: 98 F (36.7 C) 97.6 F (36.4 C) 98 F (36.7 C)   TempSrc: Oral Oral Oral   SpO2: 98% 93%    Weight:  101.3 kg    Height:        Intake/Output Summary (Last 24 hours) at 05/30/2020 0935 Last data filed at 05/30/2020 7858 Gross per 24 hour  Intake 1200 ml  Output 1100 ml  Net 100 ml   Last 3 Weights 05/30/2020 05/29/2020 05/28/2020  Weight (lbs) 223 lb 6.4 oz 218 lb 12.8 oz 224 lb  Weight (kg) 101.334 kg 99.247 kg 101.606 kg      Telemetry    Sinus rhythm with rate of 70-80s, PACs and PVCs, brief NSVTs, artifacts - Personally Reviewed  ECG    No new tracing today   - Personally  Reviewed  Physical Exam   GEN: No acute distress.   Neck: JVD low neck with HOB 45 degree  Cardiac: RRR, no murmurs, rubs, or gallops.  Respiratory: Clear to auscultation bilaterally. Speaks full sentence, DOE. On room air.  GI: Abdomen obese. Soft, nontender, non-distended  MS: BLE 1+ edema; No deformity. Neuro:  Nonfocal  Psych: Normal affect   Labs    High Sensitivity Troponin:  No results for input(s): TROPONINIHS in the last 720 hours.    Chemistry Recent Labs  Lab 05/26/20 1429 05/27/20 0301 05/28/20 0205 05/29/20 0251 05/30/20 0217  NA 138   < > 137 136 135  K 4.6   < > 4.2 3.6 4.2  CL 103   < > 99 99 98  CO2 23   < > 26 25 28   GLUCOSE 75   < > 75 57* 104*  BUN 39*   < > 41* 40* 40*  CREATININE 1.41*   < > 1.53* 1.48* 1.61*  CALCIUM 8.9   < > 8.9 8.6* 8.3*  PROT 7.0  --   --   --   --   ALBUMIN 3.2*  --   --   --   --  AST 29  --   --   --   --   ALT 27  --   --   --   --   ALKPHOS 97  --   --   --   --   BILITOT 1.0  --   --   --   --   GFRNONAA 50*   < > 45* 47* 42*  ANIONGAP 12   < > 12 12 9    < > = values in this interval not displayed.     Hematology Recent Labs  Lab 05/26/20 1429  WBC 6.8  RBC 4.31  HGB 10.5*  HCT 35.1*  MCV 81.4  MCH 24.4*  MCHC 29.9*  RDW 18.4*  PLT 276    BNP Recent Labs  Lab 05/26/20 1429  BNP 1,609.8*     DDimer No results for input(s): DDIMER in the last 168 hours.   Radiology    No results found.  Cardiac Studies   Long term monitor 05/26/20:  4 Ventricular Tachycardia runs occurred, the run with the fastest interval lasting 5 beats with a max rate of 182 bpm, the longest lasting 15 beats with an avg rate of 140 bpm. Atrial Fibrillation occurred continuously (100% burden), ranging from 46-172  bpm (avg of 84 bpm). QRS morphology changes were present throughout recording. Isolated VEs were occasional (4.0%, 42504), VE Couplets were rare (<1.0%, 4936), and VE Triplets were rare (<1.0%, 1425). Ventricular  Bigeminy and Trigeminy were present.  Echo from 05/05/20:  1. Left ventricular ejection fraction, by estimation, is 20 to 25%. The  left ventricle has severely decreased function. The left ventricle  demonstrates global hypokinesis. The left ventricular internal cavity size  was moderately dilated. Left  ventricular diastolic parameters are indeterminate. Elevated left  ventricular end-diastolic pressure.  2. Right ventricular systolic function is moderately reduced. The right  ventricular size is normal. There is mildly elevated pulmonary artery  systolic pressure.  3. Left atrial size was severely dilated.  4. Right atrial size was mildly dilated.  5. The mitral valve is normal in structure. Mild mitral valve  regurgitation. No evidence of mitral stenosis.  6. The aortic valve is normal in structure. Aortic valve regurgitation is  not visualized. No aortic stenosis is present.  7. The inferior vena cava is normal in size with greater than 50%  respiratory variability, suggesting right atrial pressure of 3 mmHg.    Patient Profile     80 y.o. male with PMH of hypertension, hyperlipidemia, OSA, CAD ( stent to LAD and RCA in 1998 and 1999, cardiac cath 2005 with patent coronaries), who is admitted for acute decompensated systolic heart failure. He was diagnosed with A fib on 04/23/20 outpatient.   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 accuracy) since admission - renal index elevated with diuresis , Cr 1.41 >1.43 >1.53 >1.48>1.61 - clinically remains hypervolemic  - BP normal,  may need increasing Lasix dosing given renal insufficiency and volume overload  - continue GDMT with Coreg, jardiance, may add Entresto before DC if  renal function stable/improving (home benazepril has been held)  Persistent A fib  NSVT - recently found in new A fib on 04/23/20  - rate 100s at admission while on amiodarone 200mg  BID and coreg 3.125mg  BID  - s/p DCCV with successful conversion to SR on 05/28/20, remains in SR currently, continue coreg and amiodarone until office follow up  - CHA2DS2VASc score is 5 due to age, CHF, DM, CAD; continue anticoagulation with Eliquis, will reduce to 2.5mg  BID today meeting criteria for renal dosing   AKI on CKD stage II - Cr baseline appears around 1.1 - Cr 1.41 >1.43 >1.53 >1.48>1.61 - renal index rising with diuresis, will hold Lasix  - monitor renal index and UOP daily  - will check renal U/S today   CAD  - denied chest pain - cardiac cath 2005 with patent coronary  - continue statin, BB, no longer on ASA while on Eliquis   HTN - BP normotensive  - continue Coreg, lasix as above, home benazepril has been held  HLD - continue statin , no recent lipid, will add   Type 2 DM with resolved hypoglycemia  - A1C 7.5%, fair control  - Lantus reduced to 10 unit QHS 05/29/20 due to hypoglycemia, continue Novolog SSI and jardiance - home regimen Lantus 25 unit QHS, metformin 1000mg  BID, and Toujeo 25 unit qHS held )  [facility] diet   OSA - CPAP at night     For questions or updates, please contact [facility] Please consult [email or web address] for contact info under        Signed, Margie Billet, NP  05/30/2020, 9:35 AM    Pt. Seen and examined. Agree with the Resident/NP/PA-C note as written. Continues to diurese, however, remains hypervolemic. Maintaining sinus rhythm. Would continue Eliquis 5 mg BID in the setting of recent DCCV - creatinine is hovering around 1.5 and may be less than that tomorrow - do not want to underdose patient given  increased risk of stroke after cardioversion. He was noted to have renal calculi by CT in 2020 - suspect he has medical renal disease, but not unreasonable to consider renal ultrasound.  Creatinine probably worse d/t recent addition of HF meds - hold off on adding Entresto until creatinine stabilizes.  Time Spent with Patient: I have spent a total of 25 minutes with patient reviewing hospital notes, telemetry, EKGs, labs and examining the patient as well as establishing an assessment and plan that was discussed with the patient. > 50% of time was spent in direct patient care.  Pixie Casino, MD, [facility], [place] Director of the [facility] Lipid [facility]  Card[facility]ovascular [facility] Diplomate of the American Board of Clinical Lipidology Attending Cardiologist  Direct Dial: [phone]  Fax: [phone]  Website:  [email or web address]

## 2020-05-31 DIAGNOSIS — I5021 Acute systolic (congestive) heart failure: Secondary | ICD-10-CM | POA: Diagnosis not present

## 2020-05-31 LAB — BASIC METABOLIC PANEL
Anion gap: 12 (ref 5–15)
BUN: 37 mg/dL — ABNORMAL HIGH (ref 8–23)
CO2: 26 mmol/L (ref 22–32)
Calcium: 8.2 mg/dL — ABNORMAL LOW (ref 8.9–10.3)
Chloride: 96 mmol/L — ABNORMAL LOW (ref 98–111)
Creatinine, Ser: 1.49 mg/dL — ABNORMAL HIGH (ref 0.61–1.24)
GFR, Estimated: 47 mL/min — ABNORMAL LOW (ref >60)
Glucose, Bld: 113 mg/dL — ABNORMAL HIGH (ref 70–99)
Potassium: 3.6 mmol/L (ref 3.5–5.1)
Sodium: 134 mmol/L — ABNORMAL LOW (ref 135–145)

## 2020-05-31 LAB — GLUCOSE, CAPILLARY
Glucose-Capillary: 94 mg/dL (ref 70–99)
Glucose-Capillary: 277 mg/dL — ABNORMAL HIGH (ref 70–99)
Glucose-Capillary: 96 mg/dL (ref 70–99)
Glucose-Capillary: 157 mg/dL — ABNORMAL HIGH (ref 70–99)

## 2020-05-31 NOTE — Progress Notes (Signed)
Progress Note  Patient Name: Wesley Winters Date of Encounter: 05/31/2020  [facility] Cardiologist: Sinclair Grooms, MD   Subjective   Denies CP; dyspnea improving  Inpatient Medications    Scheduled Meds: . amiodarone  200 mg Oral BID  . apixaban  5 mg Oral BID  . atorvastatin  40 mg Oral Daily  . carvedilol  3.125 mg Oral BID WC  . cholecalciferol  1,000 Units Oral QPM  . empagliflozin  10 mg Oral Daily  . erythromycin   Both Eyes Q6H  . furosemide  40 mg Intravenous BID  . insulin aspart  0-9 Units Subcutaneous TID WC  . insulin glargine  10 Units Subcutaneous QPM  . loratadine  10 mg Oral Daily  . pantoprazole  20 mg Oral Daily  . potassium chloride  10 mEq Oral BID  . sodium chloride flush  3 mL Intravenous Q12H  . spironolactone  12.5 mg Oral Daily  . vitamin B-12  1,000 mcg Oral Daily   Continuous Infusions: . sodium chloride     PRN Meds: sodium chloride, acetaminophen, azelastine, fluticasone, guaiFENesin-dextromethorphan, ondansetron (ZOFRAN) IV, sodium chloride flush   Vital Signs    Vitals:   05/30/20 2352 05/31/20 0329 05/31/20 0615 05/31/20 0721  BP: 115/81 110/76  119/82  Pulse: 69 67  99  Resp: 17 19  19   Temp: (!) 97.5 F (36.4 C) 97.7 F (36.5 C)  97.8 F (36.6 C)  TempSrc: Oral Oral  Oral  SpO2: 98% 100%  99%  Weight:   101.1 kg   Height:        Intake/Output Summary (Last 24 hours) at 05/31/2020 0756 Last data filed at 05/30/2020 2055 Gross per 24 hour  Intake 778 ml  Output --  Net 778 ml   Last 3 Weights 05/31/2020 05/30/2020 05/29/2020  Weight (lbs) 222 lb 12.8 oz 223 lb 6.4 oz 218 lb 12.8 oz  Weight (kg) 101.061 kg 101.334 kg 99.247 kg      Telemetry    Sinus with PVCs - Personally Reviewed  Physical Exam   GEN: No acute distress.   Neck: No JVD Cardiac: irregular Respiratory: Clear to auscultation bilaterally. GI: Soft, nontender, non-distended  MS: trace edema Neuro:  Nonfocal  Psych: Normal affect   Labs      Chemistry Recent Labs  Lab 05/26/20 1429 05/27/20 0301 05/29/20 0251 05/30/20 0217 05/31/20 0228  NA 138   < > 136 135 134*  K 4.6   < > 3.6 4.2 3.6  CL 103   < > 99 98 96*  CO2 23   < > 25 28 26   GLUCOSE 75   < > 57* 104* 113*  BUN 39*   < > 40* 40* 37*  CREATININE 1.41*   < > 1.48* 1.61* 1.49*  CALCIUM 8.9   < > 8.6* 8.3* 8.2*  PROT 7.0  --   --   --   --   ALBUMIN 3.2*  --   --   --   --   AST 29  --   --   --   --   ALT 27  --   --   --   --   ALKPHOS 97  --   --   --   --   BILITOT 1.0  --   --   --   --   GFRNONAA 50*   < > 47* 42* 47*  ANIONGAP 12   < >  12 9 12    < > = values in this interval not displayed.     Hematology Recent Labs  Lab 05/26/20 1429  WBC 6.8  RBC 4.31  HGB 10.5*  HCT 35.1*  MCV 81.4  MCH 24.4*  MCHC 29.9*  RDW 18.4*  PLT 276    BNP Recent Labs  Lab 05/26/20 1429  BNP 1,609.8*      Radiology    US RENAL  Result Date: 05/30/2020 CLINICAL DATA:  Acute renal injury EXAM: RENAL / URINARY TRACT ULTRASOUND COMPLETE COMPARISON:  02/17/2018 FINDINGS: Right Kidney: Renal measurements: 11.2 x 6.3 x 5.6 cm. = volume: 209 mL. Large 6.6 cm simple appearing cyst is noted in the midportion of the right kidney. No mass lesion or hydronephrosis is noted. 3.7 cm mildly septated cyst is noted in the upper pole of the right kidney. These changes are similar to that seen on prior CT examination. 11 mm nonobstructing stone is noted in the lower pole of the left kidney. Left Kidney: Renal measurements: 10.9 x 5.7 x 6.7 cm. = volume: 221 mL. 3.7 cm cyst is noted in the upper pole of the left kidney. It does not fulfill all the criteria for simple cyst although is stable in appearance from the prior CT examination. Previously seen renal calculus is not well appreciated on this exam. No mass lesion or hydronephrosis is noted. Bladder: Partially distended. Prostate indents upon the inferior aspect of the bladder. Other: Mild ascites is noted IMPRESSION:  Nonobstructing lower pole right renal stone measuring 11 mm. Cystic changes are noted bilaterally. Some of these are simple in nature and some are mildly complex but stable from prior exam in 2020. No obstructive changes are seen. Electronically Signed   By: Inez Catalina M.D.   On: 05/30/2020 23:28    Patient Profile     80 y.o. male with PMH of hypertension, hyperlipidemia, OSA, CAD ( stent to LAD and RCA in 1998 and 1999, cardiac cath 2005 with patent coronaries), who is admitted for acute decompensated systolic heart failure. He was diagnosed with A fib on 04/23/20 outpatient.  Echocardiogram this admission shows ejection fraction 20 to 25%, moderate left ventricular enlargement, moderate RV dysfunction, severe left atrial enlargement, mild right atrial enlargement, mild mitral regurgitation.  Underwent cardioversion on May 25.  Assessment & Plan    1 acute on chronic systolic congestive heart failure-symptoms are improving.  Continue Lasix at present dose.  Continue spironolactone.  Follow renal function.  2  cardiomyopathy-question tachycardia mediated related to atrial fibrillation.  Will need to reassess LV function in approximately 3 months to see if LV function has improved following reestablishment of sinus rhythm.  Continue carvedilol.  Blood pressure has been borderline and creatinine mildly increased.  We will begin ARB later if blood pressure and renal function allow.  I do not think his blood pressure will tolerate Entresto.  3 paroxysmal atrial fibrillation-patient remains in sinus rhythm status post cardioversion.  We will continue apixaban and amiodarone.  CHF should improve with reestablishment of sinus rhythm.  4 acute on chronic stage II kidney disease-renal function stable today.  We will continue to follow.  5 coronary artery disease-continue statin.  He is not on aspirin given need for apixaban.  6 hyperlipidemia-continue statin.  For questions or updates, please contact  [place] Please consult [email or web address] for contact info under        Signed, Kirk Ruths, MD  05/31/2020, 7:56 AM

## 2020-06-01 DIAGNOSIS — I5021 Acute systolic (congestive) heart failure: Secondary | ICD-10-CM | POA: Diagnosis not present

## 2020-06-01 LAB — GLUCOSE, CAPILLARY
Glucose-Capillary: 103 mg/dL — ABNORMAL HIGH (ref 70–99)
Glucose-Capillary: 305 mg/dL — ABNORMAL HIGH (ref 70–99)
Glucose-Capillary: 105 mg/dL — ABNORMAL HIGH (ref 70–99)
Glucose-Capillary: 185 mg/dL — ABNORMAL HIGH (ref 70–99)

## 2020-06-01 LAB — BASIC METABOLIC PANEL
Anion gap: 9 (ref 5–15)
BUN: 33 mg/dL — ABNORMAL HIGH (ref 8–23)
CO2: 32 mmol/L (ref 22–32)
Calcium: 8.1 mg/dL — ABNORMAL LOW (ref 8.9–10.3)
Chloride: 94 mmol/L — ABNORMAL LOW (ref 98–111)
Creatinine, Ser: 1.46 mg/dL — ABNORMAL HIGH (ref 0.61–1.24)
GFR, Estimated: 48 mL/min — ABNORMAL LOW (ref >60)
Glucose, Bld: 102 mg/dL — ABNORMAL HIGH (ref 70–99)
Potassium: 4.8 mmol/L (ref 3.5–5.1)
Sodium: 135 mmol/L (ref 135–145)

## 2020-06-01 MED ORDER — LOSARTAN POTASSIUM 25 MG PO TABS
25.0000 mg | ORAL_TABLET | Freq: Every day | ORAL | Status: DC
  Administered 2020-06-01 – 2020-06-03 (×3): 25 mg via ORAL
  Filled 2020-06-01 (×3): qty 1

## 2020-06-01 NOTE — Progress Notes (Signed)
Pt placed self on CPAP dream station w/out difficulty. Pt on home setting of 11 cmH2O w/no oxygen bled in. Pt respiratory status stable w/no distress noted at this time. RT will continue to monitor.

## 2020-06-01 NOTE — Progress Notes (Signed)
Progress Note  Patient Name: Wesley Winters Date of Encounter: 06/01/2020  [facility] Cardiologist: Sinclair Grooms, MD   Subjective   Denies CP; dyspnea continues to improve  Inpatient Medications    Scheduled Meds: . amiodarone  200 mg Oral BID  . apixaban  5 mg Oral BID  . atorvastatin  40 mg Oral Daily  . carvedilol  3.125 mg Oral BID WC  . cholecalciferol  1,000 Units Oral QPM  . empagliflozin  10 mg Oral Daily  . erythromycin   Both Eyes Q6H  . furosemide  40 mg Intravenous BID  . insulin aspart  0-9 Units Subcutaneous TID WC  . insulin glargine  10 Units Subcutaneous QPM  . loratadine  10 mg Oral Daily  . pantoprazole  20 mg Oral Daily  . potassium chloride  10 mEq Oral BID  . sodium chloride flush  3 mL Intravenous Q12H  . spironolactone  12.5 mg Oral Daily  . vitamin B-12  1,000 mcg Oral Daily   Continuous Infusions: . sodium chloride     PRN Meds: sodium chloride, acetaminophen, azelastine, fluticasone, guaiFENesin-dextromethorphan, ondansetron (ZOFRAN) IV, sodium chloride flush   Vital Signs    Vitals:   05/31/20 2228 05/31/20 2332 06/01/20 0359 06/01/20 0732  BP:  113/82 108/71 114/71  Pulse:  71 76 66  Resp:  17 16 17   Temp:  98 F (36.7 C) 98.1 F (36.7 C) 98.1 F (36.7 C)  TempSrc:  Oral Oral Oral  SpO2: 100% 98% 98% 100%  Weight:   100.2 kg   Height:        Intake/Output Summary (Last 24 hours) at 06/01/2020 0928 Last data filed at 06/01/2020 0900 Gross per 24 hour  Intake 540 ml  Output 300 ml  Net 240 ml   Last 3 Weights 06/01/2020 05/31/2020 05/30/2020  Weight (lbs) 221 lb 222 lb 12.8 oz 223 lb 6.4 oz  Weight (kg) 100.245 kg 101.061 kg 101.334 kg      Telemetry    Sinus with PVCs - Personally Reviewed  Physical Exam   GEN: NAD Neck: No JVD, supple Cardiac: irregular Respiratory: CTA GI: Soft, NT ND; positive presacral edema MS: trace edema Neuro:  Grossly intact Psych: Normal affect   Labs     Chemistry Recent  Labs  Lab 05/26/20 1429 05/27/20 0301 05/30/20 0217 05/31/20 0228 06/01/20 0514  NA 138   < > 135 134* 135  K 4.6   < > 4.2 3.6 4.8  CL 103   < > 98 96* 94*  CO2 23   < > 28 26 32  GLUCOSE 75   < > 104* 113* 102*  BUN 39*   < > 40* 37* 33*  CREATININE 1.41*   < > 1.61* 1.49* 1.46*  CALCIUM 8.9   < > 8.3* 8.2* 8.1*  PROT 7.0  --   --   --   --   ALBUMIN 3.2*  --   --   --   --   AST 29  --   --   --   --   ALT 27  --   --   --   --   ALKPHOS 97  --   --   --   --   BILITOT 1.0  --   --   --   --   GFRNONAA 50*   < > 42* 47* 48*  ANIONGAP 12   < > 9 12 9    < > =  values in this interval not displayed.     Hematology Recent Labs  Lab 05/26/20 1429  WBC 6.8  RBC 4.31  HGB 10.5*  HCT 35.1*  MCV 81.4  MCH 24.4*  MCHC 29.9*  RDW 18.4*  PLT 276    BNP Recent Labs  Lab 05/26/20 1429  BNP 1,609.8*      Radiology    US RENAL  Result Date: 05/30/2020 CLINICAL DATA:  Acute renal injury EXAM: RENAL / URINARY TRACT ULTRASOUND COMPLETE COMPARISON:  02/17/2018 FINDINGS: Right Kidney: Renal measurements: 11.2 x 6.3 x 5.6 cm. = volume: 209 mL. Large 6.6 cm simple appearing cyst is noted in the midportion of the right kidney. No mass lesion or hydronephrosis is noted. 3.7 cm mildly septated cyst is noted in the upper pole of the right kidney. These changes are similar to that seen on prior CT examination. 11 mm nonobstructing stone is noted in the lower pole of the left kidney. Left Kidney: Renal measurements: 10.9 x 5.7 x 6.7 cm. = volume: 221 mL. 3.7 cm cyst is noted in the upper pole of the left kidney. It does not fulfill all the criteria for simple cyst although is stable in appearance from the prior CT examination. Previously seen renal calculus is not well appreciated on this exam. No mass lesion or hydronephrosis is noted. Bladder: Partially distended. Prostate indents upon the inferior aspect of the bladder. Other: Mild ascites is noted IMPRESSION: Nonobstructing lower pole  right renal stone measuring 11 mm. Cystic changes are noted bilaterally. Some of these are simple in nature and some are mildly complex but stable from prior exam in 2020. No obstructive changes are seen. Electronically Signed   By: Inez Catalina M.D.   On: 05/30/2020 23:28    Patient Profile     80 y.o. male with PMH of hypertension, hyperlipidemia, OSA, CAD ( stent to LAD and RCA in 1998 and 1999, cardiac cath 2005 with patent coronaries), who is admitted for acute decompensated systolic heart failure. He was diagnosed with A fib on 04/23/20 outpatient.  Echocardiogram this admission shows ejection fraction 20 to 25%, moderate left ventricular enlargement, moderate RV dysfunction, severe left atrial enlargement, mild right atrial enlargement, mild mitral regurgitation.  Underwent cardioversion on May 25.  Assessment & Plan    1 acute on chronic systolic congestive heart failure-symptoms continue to improve.  Continue Lasix and spironolactone at present dose.  Follow renal function.  Symptoms hopefully should also improve now that sinus has been reestablished.  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  Blood pressure mildly improved.  We will try low-dose losartan (25 mg daily) and follow.  I do not think his blood pressure will tolerate Entresto.  Continue low-dose carvedilol.  Titrate medications as tolerated.  Will need to reassess LV function in approximately 3 months to see if LV function has improved following reestablishment of sinus rhythm.    3 paroxysmal atrial fibrillation-patient remains in sinus rhythm status post cardioversion.  We will continue apixaban and amiodarone (continue 200 mg twice daily for 1 week and then decrease to 200 mg daily thereafter).  CHF should improve with reestablishment of sinus rhythm.  4 acute on chronic stage II kidney disease-renal function unchanged today.  We will continue to follow.  5 coronary artery  disease-continue statin.  He is not on aspirin given need for apixaban.  6 hyperlipidemia-continue statin.  For questions or updates, please contact [place] Please consult [email or web address] for contact info  under        Signed, Kirk Ruths, MD  06/01/2020, 9:28 AM

## 2020-06-02 DIAGNOSIS — I5021 Acute systolic (congestive) heart failure: Secondary | ICD-10-CM | POA: Diagnosis not present

## 2020-06-02 DIAGNOSIS — I4819 Other persistent atrial fibrillation: Secondary | ICD-10-CM | POA: Diagnosis not present

## 2020-06-02 LAB — BASIC METABOLIC PANEL
Anion gap: 9 (ref 5–15)
BUN: 31 mg/dL — ABNORMAL HIGH (ref 8–23)
CO2: 30 mmol/L (ref 22–32)
Calcium: 8.2 mg/dL — ABNORMAL LOW (ref 8.9–10.3)
Chloride: 95 mmol/L — ABNORMAL LOW (ref 98–111)
Creatinine, Ser: 1.44 mg/dL — ABNORMAL HIGH (ref 0.61–1.24)
GFR, Estimated: 49 mL/min — ABNORMAL LOW (ref >60)
Glucose, Bld: 182 mg/dL — ABNORMAL HIGH (ref 70–99)
Potassium: 3.5 mmol/L (ref 3.5–5.1)
Sodium: 134 mmol/L — ABNORMAL LOW (ref 135–145)

## 2020-06-02 LAB — GLUCOSE, CAPILLARY
Glucose-Capillary: 127 mg/dL — ABNORMAL HIGH (ref 70–99)
Glucose-Capillary: 138 mg/dL — ABNORMAL HIGH (ref 70–99)
Glucose-Capillary: 172 mg/dL — ABNORMAL HIGH (ref 70–99)
Glucose-Capillary: 129 mg/dL — ABNORMAL HIGH (ref 70–99)

## 2020-06-02 MED ORDER — AMIODARONE HCL 200 MG PO TABS
200.0000 mg | ORAL_TABLET | Freq: Every day | ORAL | Status: DC
  Administered 2020-06-02 – 2020-06-03 (×2): 200 mg via ORAL
  Filled 2020-06-02 (×2): qty 1

## 2020-06-02 NOTE — Plan of Care (Signed)

## 2020-06-02 NOTE — Progress Notes (Signed)
Patient has already placed himself on CPAP for the night.

## 2020-06-02 NOTE — Progress Notes (Signed)
Progress Note  Patient Name: Wesley Winters Date of Encounter: 06/02/2020  Primary Cardiologist: Sinclair Grooms, MD  Subjective   No palpitations or chest pain.  Feels less breathless moving around the room but still not at baseline.  Thinks his home weight is "217 pounds."  Inpatient Medications    Scheduled Meds: . amiodarone  200 mg Oral BID  . apixaban  5 mg Oral BID  . atorvastatin  40 mg Oral Daily  . carvedilol  3.125 mg Oral BID WC  . cholecalciferol  1,000 Units Oral QPM  . empagliflozin  10 mg Oral Daily  . erythromycin   Both Eyes Q6H  . furosemide  40 mg Intravenous BID  . insulin aspart  0-9 Units Subcutaneous TID WC  . insulin glargine  10 Units Subcutaneous QPM  . loratadine  10 mg Oral Daily  . losartan  25 mg Oral Daily  . pantoprazole  20 mg Oral Daily  . potassium chloride  10 mEq Oral BID  . sodium chloride flush  3 mL Intravenous Q12H  . spironolactone  12.5 mg Oral Daily  . vitamin B-12  1,000 mcg Oral Daily   Continuous Infusions: . sodium chloride     PRN Meds: sodium chloride, acetaminophen, azelastine, fluticasone, guaiFENesin-dextromethorphan, ondansetron (ZOFRAN) IV, sodium chloride flush   Vital Signs    Vitals:   06/01/20 2336 06/02/20 0314 06/02/20 0340 06/02/20 0722  BP: 113/70  110/70 111/65  Pulse: 65  67 61  Resp: 19  17 16   Temp: 98.1 F (36.7 C)  98.3 F (36.8 C) 97.9 F (36.6 C)  TempSrc: Oral  Oral Oral  SpO2: 99%  99% 97%  Weight:  99.7 kg    Height:        Intake/Output Summary (Last 24 hours) at 06/02/2020 0733 Last data filed at 06/01/2020 2132 Gross per 24 hour  Intake 1020 ml  Output 1600 ml  Net -580 ml   Filed Weights   05/31/20 0615 06/01/20 0359 06/02/20 0314  Weight: 101.1 kg 100.2 kg 99.7 kg    Telemetry    Sinus rhythm.  Personally reviewed.  ECG    An ECG dated 05/28/2020 was personally reviewed today and demonstrated:  Sinus rhythm with left anterior fascicular block, poor R wave  progression/IVCD.  Physical Exam   GEN:  Elderly male.  No acute distress.   Neck: No JVD. Cardiac: RRR, no murmur, no gallop.  Respiratory: Nonlabored. Clear to auscultation bilaterally. GI: Soft, nontender, bowel sounds present. MS:  Mild leg edema; No deformity. Neuro:  Nonfocal. Psych: Alert and oriented x 3. Normal affect.  Labs    Chemistry Recent Labs  Lab 05/26/20 1429 05/27/20 0301 05/31/20 0228 06/01/20 0514 06/02/20 0214  NA 138   < > 134* 135 134*  K 4.6   < > 3.6 4.8 3.5  CL 103   < > 96* 94* 95*  CO2 23   < > 26 32 30  GLUCOSE 75   < > 113* 102* 182*  BUN 39*   < > 37* 33* 31*  CREATININE 1.41*   < > 1.49* 1.46* 1.44*  CALCIUM 8.9   < > 8.2* 8.1* 8.2*  PROT 7.0  --   --   --   --   ALBUMIN 3.2*  --   --   --   --   AST 29  --   --   --   --   ALT 27  --   --   --   --  ALKPHOS 97  --   --   --   --   BILITOT 1.0  --   --   --   --   GFRNONAA 50*   < > 47* 48* 49*  ANIONGAP 12   < > 12 9 9    < > = values in this interval not displayed.     Hematology Recent Labs  Lab 05/26/20 1429  WBC 6.8  RBC 4.31  HGB 10.5*  HCT 35.1*  MCV 81.4  MCH 24.4*  MCHC 29.9*  RDW 18.4*  PLT 276    BNP Recent Labs  Lab 05/26/20 1429  BNP 1,609.8*     Radiology    No results found.  Cardiac Studies   Echocardiogram 05/05/2020: 1. Left ventricular ejection fraction, by estimation, is 20 to 25%. The  left ventricle has severely decreased function. The left ventricle  demonstrates global hypokinesis. The left ventricular internal cavity size  was moderately dilated. Left  ventricular diastolic parameters are indeterminate. Elevated left  ventricular end-diastolic pressure.  2. Right ventricular systolic function is moderately reduced. The right  ventricular size is normal. There is mildly elevated pulmonary artery  systolic pressure.  3. Left atrial size was severely dilated.  4. Right atrial size was mildly dilated.  5. The mitral valve is  normal in structure. Mild mitral valve  regurgitation. No evidence of mitral stenosis.  6. The aortic valve is normal in structure. Aortic valve regurgitation is  not visualized. No aortic stenosis is present.  7. The inferior vena cava is normal in size with greater than 50%  respiratory variability, suggesting right atrial pressure of 3 mmHg.  Patient Profile     80 y.o. male with a history of hypertension, hyperlipidemia, OSA, CAD ( stent to LAD and RCA in 1998 and 1999, cardiac cath 2005 with patent coronaries), who is admitted for acute decompensated systolic heart failure. He was diagnosed with A fib on 04/23/20 outpatient.  Echocardiogram this admission shows ejection fraction 20 to 25%, moderate left ventricular enlargement, moderate RV dysfunction, severe left atrial enlargement, mild right atrial enlargement, mild mitral regurgitation.  Underwent cardioversion on May 25.  Assessment & Plan    1.  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  LVEF 20 to 25% with associated moderate RV dysfunction.  Fluid status improving, he is approaching reported outpatient baseline weight on IV Lasix.  Currently on Coreg, Jardiance, Cozaar, Aldactone, and Lasix 40 mg IV twice daily with potassium supplement.  2.  Paroxysmal to persistent atrial fibrillation, CHA2DS2-VASc score is 5.  He is status post successful cardioversion on May 25, continues on Eliquis for stroke prophylaxis.  Low-dose amiodarone load of 200 mg twice daily.  3.  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  4.  CAD with previous LAD and RCA stent interventions, patent in 2005.  No active angina at this time.  Not on aspirin given use of Eliquis.  Continue statin therapy.  Decrease amiodarone to 200 mg once daily, has completed 1 week load.  Continue IV Lasix today  and then convert to oral regimen tomorrow for discharge most likely.  Check BMET in AM.  Otherwise continue Coreg, Cozaar, Jardiance, and Aldactone.  Signed, Rozann Lesches, MD  06/02/2020, 7:33 AM

## 2020-06-02 NOTE — Care Management Important Message (Signed)
Important Message  Patient Details  Name: BENJIMAN SEDGWICK MRN: [id number] Date of Birth: 1940/07/14   Medicare Important Message Given:  Yes     Shelda Altes 06/02/2020, 8:38 AM

## 2020-06-03 ENCOUNTER — Encounter (HOSPITAL_COMMUNITY): Payer: Self-pay | Admitting: Interventional Cardiology

## 2020-06-03 HISTORY — DX: Hypomagnesemia: E83.42

## 2020-06-03 LAB — CBC
HCT: 37.2 % — ABNORMAL LOW (ref 39.0–52.0)
Hemoglobin: 10.9 g/dL — ABNORMAL LOW (ref 13.0–17.0)
MCH: 23.7 pg — ABNORMAL LOW (ref 26.0–34.0)
MCHC: 29.3 g/dL — ABNORMAL LOW (ref 30.0–36.0)
MCV: 80.9 fL (ref 80.0–100.0)
Platelets: 255 10*3/uL (ref 150–400)
RBC: 4.6 MIL/uL (ref 4.22–5.81)
RDW: 18.8 % — ABNORMAL HIGH (ref 11.5–15.5)
WBC: 9.6 10*3/uL (ref 4.0–10.5)
nRBC: 0 % (ref 0.0–0.2)

## 2020-06-03 LAB — GLUCOSE, CAPILLARY
Glucose-Capillary: 128 mg/dL — ABNORMAL HIGH (ref 70–99)
Glucose-Capillary: 242 mg/dL — ABNORMAL HIGH (ref 70–99)

## 2020-06-03 LAB — BASIC METABOLIC PANEL
Anion gap: 13 (ref 5–15)
BUN: 27 mg/dL — ABNORMAL HIGH (ref 8–23)
CO2: 29 mmol/L (ref 22–32)
Calcium: 8.5 mg/dL — ABNORMAL LOW (ref 8.9–10.3)
Chloride: 92 mmol/L — ABNORMAL LOW (ref 98–111)
Creatinine, Ser: 1.33 mg/dL — ABNORMAL HIGH (ref 0.61–1.24)
GFR, Estimated: 53 mL/min — ABNORMAL LOW (ref >60)
Glucose, Bld: 161 mg/dL — ABNORMAL HIGH (ref 70–99)
Potassium: 4.1 mmol/L (ref 3.5–5.1)
Sodium: 134 mmol/L — ABNORMAL LOW (ref 135–145)

## 2020-06-03 MED ORDER — EMPAGLIFLOZIN 10 MG PO TABS: 10.0000 mg | ORAL_TABLET | Freq: Every day | ORAL | 6 refills | Status: DC

## 2020-06-03 MED ORDER — AMIODARONE HCL 200 MG PO TABS: 200.0000 mg | ORAL_TABLET | Freq: Every day | ORAL | Status: DC

## 2020-06-03 MED ORDER — SPIRONOLACTONE 25 MG PO TABS: 12.5000 mg | ORAL_TABLET | Freq: Every day | ORAL | 6 refills | Status: DC

## 2020-06-03 MED ORDER — LOSARTAN POTASSIUM 25 MG PO TABS: 25.0000 mg | ORAL_TABLET | Freq: Every day | ORAL | 6 refills | Status: DC

## 2020-06-03 MED ORDER — ERYTHROMYCIN 5 MG/GM OP OINT: TOPICAL_OINTMENT | Freq: Four times a day (QID) | OPHTHALMIC | 0 refills | Status: DC

## 2020-06-03 MED ORDER — FUROSEMIDE 40 MG PO TABS: 40.0000 mg | ORAL_TABLET | Freq: Two times a day (BID) | ORAL | 6 refills | Status: DC

## 2020-06-03 MED ORDER — FUROSEMIDE 40 MG PO TABS: 40.0000 mg | ORAL_TABLET | Freq: Two times a day (BID) | ORAL | Status: DC

## 2020-06-03 NOTE — Progress Notes (Signed)
Progress Note  Patient Name: Wesley Winters Date of Encounter: 06/03/2020  [facility] Cardiologist: Sinclair Grooms, MD   Subjective   No CP or dyspnea this AM  Inpatient Medications    Scheduled Meds: . amiodarone  200 mg Oral Daily  . apixaban  5 mg Oral BID  . atorvastatin  40 mg Oral Daily  . carvedilol  3.125 mg Oral BID WC  . cholecalciferol  1,000 Units Oral QPM  . empagliflozin  10 mg Oral Daily  . erythromycin   Both Eyes Q6H  . furosemide  40 mg Intravenous BID  . insulin aspart  0-9 Units Subcutaneous TID WC  . insulin glargine  10 Units Subcutaneous QPM  . loratadine  10 mg Oral Daily  . losartan  25 mg Oral Daily  . pantoprazole  20 mg Oral Daily  . potassium chloride  10 mEq Oral BID  . sodium chloride flush  3 mL Intravenous Q12H  . spironolactone  12.5 mg Oral Daily  . vitamin B-12  1,000 mcg Oral Daily   Continuous Infusions: . sodium chloride     PRN Meds: sodium chloride, acetaminophen, azelastine, fluticasone, guaiFENesin-dextromethorphan, ondansetron (ZOFRAN) IV, sodium chloride flush   Vital Signs    Vitals:   06/02/20 2035 06/02/20 2303 06/03/20 0436 06/03/20 0758  BP: 111/61 110/66 102/66 116/63  Pulse:   61 68  Resp: 20 17 17 18   Temp: 97.9 F (36.6 C) 97.7 F (36.5 C) 97.8 F (36.6 C) 97.8 F (36.6 C)  TempSrc: Oral Oral Oral Oral  SpO2: 99% 100% 100% 100%  Weight:   98.2 kg   Height:        Intake/Output Summary (Last 24 hours) at 06/03/2020 7096 Last data filed at 06/03/2020 0440 Gross per 24 hour  Intake 780 ml  Output 1550 ml  Net -770 ml   Last 3 Weights 06/03/2020 06/02/2020 06/01/2020  Weight (lbs) 216 lb 9.6 oz 219 lb 12.8 oz 221 lb  Weight (kg) 98.249 kg 99.701 kg 100.245 kg      Telemetry    Sinus with PVCs - Personally Reviewed  Physical Exam   GEN: NAD, WD/WN Neck: supple Cardiac: irregular, no gallop Respiratory: CTA; no wheeze GI: Soft, NT ND, no masses MS: trace edema Neuro:  No focal  findings Psych: Normal affect   Labs     Chemistry Recent Labs  Lab 06/01/20 0514 06/02/20 0214 06/03/20 0402  NA 135 134* 134*  K 4.8 3.5 4.1  CL 94* 95* 92*  CO2 32 30 29  GLUCOSE 102* 182* 161*  BUN 33* 31* 27*  CREATININE 1.46* 1.44* 1.33*  CALCIUM 8.1* 8.2* 8.5*  GFRNONAA 48* 49* 53*  ANIONGAP 9 9 13      Hematology Recent Labs  Lab 06/03/20 0402  WBC 9.6  RBC 4.60  HGB 10.9*  HCT 37.2*  MCV 80.9  MCH 23.7*  MCHC 29.3*  RDW 18.8*  PLT 255    Patient Profile     80 y.o. male with PMH of hypertension, hyperlipidemia, OSA, CAD ( stent to LAD and RCA in 1998 and 1999, cardiac cath 2005 with patent coronaries), who is admitted for acute decompensated systolic heart failure. He was diagnosed with A fib on 04/23/20 outpatient.  Echocardiogram this admission shows ejection fraction 20 to 25%, moderate left ventricular enlargement, moderate RV dysfunction, severe left atrial enlargement, mild right atrial enlargement, mild mitral regurgitation.  Underwent cardioversion on May 25.  Assessment & Plan  1 acute on chronic systolic congestive heart failure-much improved.  Continue Lasix 40 mg by mouth twice daily at discharge.  Continue present dose of spironolactone.  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  Continue low-dose losartan and carvedilol.  Can titrate as an outpatient based on pulse, blood pressure and renal function.  Will need to reassess LV function in approximately 3 months to see if LV function has improved following reestablishment of sinus rhythm.    3 paroxysmal atrial fibrillation-patient remains in sinus rhythm status post cardioversion.  Continue amiodarone 200 mg daily and apixaban.  4 acute on chronic stage II kidney disease-renal function unchanged.  5 coronary artery disease-continue statin.  He is not on aspirin given need for apixaban.  6 hyperlipidemia-continue statin.  Discharge today.   Continue medications as listed in MAR.  Check potassium and renal function in 1 week.  Schedule follow-up APP visit in 1 week.  Follow-up Dr. Tamala Julian 3 months.  Greater than 30 minutes PA and physician time. D2  For questions or updates, please contact [place] Please consult [email or web address] for contact info under        Signed, Kirk Ruths, MD  06/03/2020, 9:22 AM

## 2020-06-03 NOTE — Discharge Summary (Signed)
Discharge Summary    Patient ID: Wesley Winters MRN: [id number]; DOB: Jul 07, 1940  Admit date: 05/26/2020 Discharge date: 06/03/2020  PCP:  Wesley Orn, MD   [facility] Providers Cardiologist:  Wesley Grooms, MD        Discharge Diagnoses    Principal Problem:   Acute systolic heart failure [facility]) Active Problems:   Diabetes ([place])   CAD in native artery   Hyperlipidemia LDL goal <70   Persistent atrial fibrillation [facility])   Chronic anticoagulation   Hypomagnesemia    Diagnostic Studies/Procedures    Monitor 05/26/20  Patch Wear Time:  14 days and 0 hours (2022-05-02T12:49:05-398 to 2022-05-16T12:49:09-398)  4 Ventricular Tachycardia runs occurred, the run with the fastest interval lasting 5 beats with a max rate of 182 bpm, the longest lasting 15 beats with an avg rate of 140 bpm. Atrial Fibrillation occurred continuously (100% burden), ranging from 46-172  bpm (avg of 84 bpm). QRS morphology changes were present throughout recording. Isolated VEs were occasional (4.0%, 42504), VE Couplets were rare (<1.0%, 4936), and VE Triplets were rare (<1.0%, 1425). Ventricular Bigeminy and Trigeminy were present.       DCCV 05/28/20   Procedure: Electrical Cardioversion Indications:  Atrial Fibrillation  Procedure Details Consent: Risks of procedure as well as the alternatives and risks of each were explained to the (patient/caregiver).  Consent for procedure obtained. Time Out: Verified patient identification, verified procedure, site/side was marked, verified correct patient position, special equipment/implants available, medications/allergies/relevent history reviewed, required imaging and test results available.  Performed  Patient placed on cardiac monitor, pulse oximetry, supplemental oxygen as necessary.  Sedation given: propofol Pacer pads placed anterior and posterior chest.  Cardioverted 1 time(s).  Cardioverted at [place].  Evaluation Findings: Post  procedure EKG shows: sinus rhythm with PVCs Complications: None Patient did tolerate procedure well.  _____________   History of Present Illness     Wesley Winters is a 80 y.o. male with a hx of primary hypertension, hyperlipidemia, OSA,CAD (stent to LAD and RCA in 1998 and 1999; patent coronary arteries 4235), acute systolic HF 36%, and AF unknown duration 04/23/2020.  He was seen in Dr. Thompson Winters office 05/26/20 for new acute systolic heart failure, unknown duration. Most recent EF 25% down from prior at 50%.  New atrial fib identified 04/23/20.  Plan was for rate control, diuresis, anticoagulation for 3-4 weeks then DCCV.  On visit 05/26/20 he had 10 lb wt gain.  And BP was soft.  He had increasing abdominal girth.  Continued lower ext edema.  He does wear CPAP at night and keeps his HOB elevated 30 degrees.  Pt had wanted to try home therapy but with no improvement he agreed to admit and diuresing.   Pt admitted and placed on IV diuretics.  eliquis was continued for his atrial fib. Due to soft BP Lotensin/benazepril.  Labs with K+ 4.6 BUN 39, Cr 1.41  hgb 10.5 WBC 6.8 and plts 276.  Mg+ 1.4  BNP 1609  TSH 4.049 A1C  7.5  covid neg.    Hospital Course     Consultants: none   Pt was diuresed. He is neg 2062 at discharge and wt down from 102.7 Kg to 98.2 Kg. Plan lasix 40 mg BID at discharge. continue spironolactone.  Did not feel entresto would be tolerated with his soft BP.  Check BMP in 1 week at specialty clinic appt.   He underwent DCCV on 05/28/20 that was successful.  Continue eliquis. His amiodarone  decreased after a week to 200 mg once dialyu.   He did have a cough and bronchitis was suspected as well.    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  Plan recheck echo in 3 months.    OSA he wore CPAP every night . His CAD was stable no angina and not on ASA due to eliquis.  HLD continue statin.    Did the patient have an acute coronary syndrome (MI, NSTEMI, STEMI,  etc) this admission?:  No                               Did the patient have a percutaneous coronary intervention (stent / angioplasty)?:  No.       _____________  Discharge Vitals Blood pressure 102/62, pulse 68, temperature (!) 97.5 F (36.4 C), temperature source Oral, resp. rate 18, height 5\' 8"  (1.727 m), weight 98.2 kg, SpO2 98 %.  Filed Weights   06/01/20 0359 06/02/20 0314 06/03/20 0436  Weight: 100.2 kg 99.7 kg 98.2 kg    Labs & Radiologic Studies    CBC Recent Labs    06/03/20 0402  WBC 9.6  HGB 10.9*  HCT 37.2*  MCV 80.9  PLT 703   Basic Metabolic Panel Recent Labs    06/02/20 0214 06/03/20 0402  NA 134* 134*  K 3.5 4.1  CL 95* 92*  CO2 30 29  GLUCOSE 182* 161*  BUN 31* 27*  CREATININE 1.44* 1.33*  CALCIUM 8.2* 8.5*   Liver Function Tests No results for input(s): AST, ALT, ALKPHOS, BILITOT, PROT, ALBUMIN in the last 72 hours. No results for input(s): LIPASE, AMYLASE in the last 72 hours. High Sensitivity Troponin:   No results for input(s): TROPONINIHS in the last 720 hours.  BNP Invalid input(s): POCBNP D-Dimer No results for input(s): DDIMER in the last 72 hours. Hemoglobin A1C No results for input(s): HGBA1C in the last 72 hours. Fasting Lipid Panel No results for input(s): CHOL, HDL, LDLCALC, TRIG, CHOLHDL, LDLDIRECT in the last 72 hours. Thyroid Function Tests No results for input(s): TSH, T4TOTAL, T3FREE, THYROIDAB in the last 72 hours.  Invalid input(s): FREET3 _____________  DG Chest 2 View  Result Date: 05/27/2020 CLINICAL DATA:  Shortness of breath EXAM: CHEST - 2 VIEW COMPARISON:  07/26/2019 FINDINGS: Cardiac shadow is prominent but accentuated by the portable technique. Aortic calcifications are again noted and stable. The lungs are well aerated bilaterally. Small posterior effusions are noted best seen on the lateral projection. No bony abnormality is seen. IMPRESSION: Small effusions without focal infiltrate. No other focal  abnormality is noted. Electronically Signed   By: Wesley Winters M.D.   On: 05/27/2020 08:18   US RENAL  Result Date: 05/30/2020 CLINICAL DATA:  Acute renal injury EXAM: RENAL / URINARY TRACT ULTRASOUND COMPLETE COMPARISON:  02/17/2018 FINDINGS: Right Kidney: Renal measurements: 11.2 x 6.3 x 5.6 cm. = volume: 209 mL. Large 6.6 cm simple appearing cyst is noted in the midportion of the right kidney. No mass lesion or hydronephrosis is noted. 3.7 cm mildly septated cyst is noted in the upper pole of the right kidney. These changes are similar to that seen on prior CT examination. 11 mm nonobstructing stone is noted in the lower pole of the left kidney. Left Kidney: Renal measurements: 10.9 x 5.7 x 6.7 cm. = volume: 221 mL. 3.7 cm cyst is noted in the upper pole of the left kidney.  It does not fulfill all the criteria for simple cyst although is stable in appearance from the prior CT examination. Previously seen renal calculus is not well appreciated on this exam. No mass lesion or hydronephrosis is noted. Bladder: Partially distended. Prostate indents upon the inferior aspect of the bladder. Other: Mild ascites is noted IMPRESSION: Nonobstructing lower pole right renal stone measuring 11 mm. Cystic changes are noted bilaterally. Some of these are simple in nature and some are mildly complex but stable from prior exam in 2020. No obstructive changes are seen. Electronically Signed   By: Wesley Winters M.D.   On: 05/30/2020 23:28   ECHOCARDIOGRAM COMPLETE  Result Date: 05/05/2020    ECHOCARDIOGRAM REPORT   Patient Name:   QUANTAVIOUS EGGERT  Date of Exam: 05/05/2020 Medical Rec #:  [id number]     Height:       68.0 in Accession #:    [id number]    Weight:       223.2 lb Date of Birth:  11/28/1940     BSA:          2.141 m Patient Age:    19 years      BP:           98/71 mmHg Patient Gender: M             HR:           80 bpm. Exam Location:  [place] Procedure: 2D Echo, Cardiac Doppler, Color Doppler and Intracardiac             Opacification Agent Indications:    I48.91 Atrial Fibrillations  History:        Patient has prior history of Echocardiogram examinations, most                 recent 03/15/2018. CAD; Risk Factors:Hypertension, Diabetes,                 Sleep Apnea and HLD.  Sonographer:    Marygrace Drought RCS Referring Phys: [place]  1. Left ventricular ejection fraction, by estimation, is 20 to 25%. The left ventricle has severely decreased function. The left ventricle demonstrates global hypokinesis. The left ventricular internal cavity size was moderately dilated. Left ventricular diastolic parameters are indeterminate. Elevated left ventricular end-diastolic pressure.  2. Right ventricular systolic function is moderately reduced. The right ventricular size is normal. There is mildly elevated pulmonary artery systolic pressure.  3. Left atrial size was severely dilated.  4. Right atrial size was mildly dilated.  5. The mitral valve is normal in structure. Mild mitral valve regurgitation. No evidence of mitral stenosis.  6. The aortic valve is normal in structure. Aortic valve regurgitation is not visualized. No aortic stenosis is present.  7. The inferior vena cava is normal in size with greater than 50% respiratory variability, suggesting right atrial pressure of 3 mmHg. FINDINGS  Left Ventricle: Left ventricular ejection fraction, by estimation, is 20 to 25%. The left ventricle has severely decreased function. The left ventricle demonstrates global hypokinesis. The left ventricular internal cavity size was moderately dilated. There is no left ventricular hypertrophy. Left ventricular diastolic parameters are indeterminate. Elevated left ventricular end-diastolic pressure. Right Ventricle: The right ventricular size is normal. No increase in right ventricular wall thickness. Right ventricular systolic function is moderately reduced. There is mildly elevated pulmonary artery systolic pressure. The  tricuspid regurgitant velocity is 3.19 m/s, and with an assumed right atrial pressure of 3 mmHg,  the estimated right ventricular systolic pressure is 78.2 mmHg. Left Atrium: Left atrial size was severely dilated. Right Atrium: Right atrial size was mildly dilated. Pericardium: There is no evidence of pericardial effusion. Mitral Valve: The mitral valve is normal in structure. Mild mitral valve regurgitation. No evidence of mitral valve stenosis. Tricuspid Valve: The tricuspid valve is normal in structure. Tricuspid valve regurgitation is mild . No evidence of tricuspid stenosis. Aortic Valve: The aortic valve is normal in structure. Aortic valve regurgitation is not visualized. No aortic stenosis is present. Pulmonic Valve: The pulmonic valve was normal in structure. Pulmonic valve regurgitation is not visualized. No evidence of pulmonic stenosis. Aorta: The aortic root is normal in size and structure. Venous: The inferior vena cava is normal in size with greater than 50% respiratory variability, suggesting right atrial pressure of 3 mmHg. IAS/Shunts: No atrial level shunt detected by color flow Doppler.  LEFT VENTRICLE PLAX 2D LVIDd:         6.00 cm  Diastology LVIDs:         5.20 cm  LV e' medial:    4.24 cm/s LV PW:         1.10 cm  LV E/e' medial:  28.3 LV IVS:        1.00 cm  LV e' lateral:   7.51 cm/s LVOT diam:     2.10 cm  LV E/e' lateral: 16.0 LV SV:         58 LV SV Index:   27 LVOT Area:     3.46 cm  RIGHT VENTRICLE RV Basal diam:  3.50 cm RV S prime:     10.20 cm/s TAPSE (M-mode): 1.4 cm RVSP:           43.7 mmHg LEFT ATRIUM              Index       RIGHT ATRIUM           Index LA diam:        4.60 cm  2.15 cm/m  RA Pressure: 3.00 mmHg LA Vol (A2C):   72.0 ml  33.62 ml/m RA Area:     21.30 cm LA Vol (A4C):   103.0 ml 48.10 ml/m RA Volume:   68.30 ml  31.90 ml/m LA Biplane Vol: 85.4 ml  39.88 ml/m  AORTIC VALVE LVOT Vmax:   77.00 cm/s LVOT Vmean:  51.700 cm/s LVOT VTI:    0.168 m  AORTA Ao Root  diam: 3.00 cm Ao Asc diam:  3.10 cm MITRAL VALVE                 TRICUSPID VALVE MV Area (PHT):               TR Peak grad:   40.7 mmHg MV Decel Time:               TR Vmax:        319.00 cm/s MR Peak grad:    56.9 mmHg   Estimated RAP:  3.00 mmHg MR Mean grad:    40.0 mmHg   RVSP:           43.7 mmHg MR Vmax:         377.00 cm/s MR Vmean:        300.0 cm/s  SHUNTS MR PISA:         1.79 cm    Systemic VTI:  0.17 m MR PISA Eff ROA: 15 mm  Systemic Diam: 2.10 cm MR PISA Radius:  0.53 cm MV E velocity: 120.00 cm/s [name] MD Electronically signed by [name] MD Signature Date/Time: 05/05/2020/3:49:08 PM    Final    Disposition   Pt is being discharged home today in good condition.  Follow-up Plans & Appointments    Heart Healthy Diabetic Diet low salt.  Weigh daily and record on paper to bring with you to office to see pattern.   Weight every morning with just underwear or pajamas. Call our office (Dr. Thompson Winters office) for weight increase of 3 pounds in a day or 5 pounds in a week.    Follow meds on your discharge instructions. Thanks.     Follow-up Information    [facility] Follow up on 06/09/2020.   Specialty: Cardiology Why: at 3:00 PM this office is in the hospital,  call the day before or that morning to get code for parking.    Contact information: [address] [id number] [place]       Almyra Deforest, [place] Follow up on 06/17/2020.   Specialties: Cardiology, Radiology Why: at 2:15 PM  at the [facility] information: [address] [place] [address] [phone]        Belva Crome, MD Follow up.   Specialty: Cardiology Why: the office will call with date and time. Contact information: [address] [place] [address] [phone]                Discharge Medications   Allergies as of 06/03/2020       Reactions   Metformin Hcl Diarrhea   Other Other (See Comments), Cough   Pollen- Chest congestion, also   Amoxicillin Hives, Nausea Only, Rash   Amoxicillin-pot Clavulanate Rash   Empagliflozin Nausea Only, Anxiety, Other (See Comments)   Made the patient jittery and nauseous   Januvia [sitagliptin] Anxiety      Medication List    STOP taking these medications   benazepril 20 MG tablet Commonly known as: LOTENSIN     TAKE these medications   acetaminophen 500 MG tablet Commonly known as: TYLENOL Take 500-1,000 mg by mouth 3 (three) times daily as needed for moderate pain, mild pain or headache.   amiodarone 200 MG tablet Commonly known as: PACERONE Take 1 tablet (200 mg total) by mouth daily. Start taking on: June 04, 2020 What changed: when to take this   apixaban 5 MG Tabs tablet Commonly known as: ELIQUIS Take 1 tablet (5 mg total) by mouth 2 (two) times daily.   atorvastatin 40 MG tablet Commonly known as: LIPITOR Take 40 mg by mouth daily.   augmented betamethasone dipropionate 0.05 % cream Commonly known as: DIPROLENE-AF Apply 1 application topically daily as needed (for rashes).   azelastine 0.1 % nasal spray Commonly known as: ASTELIN Place 2 sprays into both nostrils 2 (two) times daily as needed for allergies. Use in each nostril as directed   B-D UF III MINI PEN NEEDLES 31G X 5 MM Misc Generic drug: Insulin Pen Needle SMARTSIG:1 SUB-Q Every Night   CareTouch 2 CPAP Hose [employer] Misc 11cm   carvedilol 3.125 MG tablet Commonly known as: Coreg Take 1 tablet (3.125 mg total) by mouth 2 (two) times daily with a meal.   cetirizine 10 MG tablet Commonly known as: ZYRTEC Take 10 mg by mouth in the morning.   cholecalciferol 25  MCG (1000 UNIT) tablet Commonly known as: VITAMIN D3 Take 1,000 Units by mouth at bedtime.   empagliflozin 10 MG Tabs tablet Commonly known as: JARDIANCE Take 1 tablet (10 mg total) by mouth daily. Start taking on: June 04, 2020   erythromycin ophthalmic ointment Place into both eyes every 6 (six) hours.   fluticasone 50 MCG/ACT nasal spray Commonly known as: FLONASE Place 2 sprays into both nostrils daily as needed for allergies.   furosemide 40 MG tablet Commonly known as: LASIX Take 1 tablet (40 mg total) by mouth 2 (two) times daily. What changed:   medication strength  how much to take  when to take this   guaiFENesin-dextromethorphan 100-10 MG/5ML syrup Commonly known as: ROBITUSSIN DM Take 10 mLs by mouth every 4 (four) hours as needed for cough.   losartan 25 MG tablet Commonly known as: COZAAR Take 1 tablet (25 mg total) by mouth daily. Start taking on: June 04, 2020   metFORMIN 500 MG 24 hr tablet Commonly known as: GLUCOPHAGE-XR Take 1,000 mg by mouth 2 (two) times daily.   mupirocin ointment 2 % Commonly known as: BACTROBAN APPLY TO RIGHT GREAT TOE AND RIGHT 3RD TOE ONCE DAILY What changed: See the new instructions.   OneTouch Ultra test strip Generic drug: glucose blood 1 each 3 (three) times daily.   pantoprazole 40 MG tablet Commonly known as: PROTONIX Take 40 mg by mouth See admin instructions. Take 40 mg by mouth one to two times a day as needed for reflux What changed: Another medication with the same name was removed. Continue taking this medication, and follow the directions you see here.   polyethylene glycol 17 g packet Commonly known as: MIRALAX / GLYCOLAX Take 17 g by mouth daily as needed for mild constipation (MIX AND DRINK).   potassium chloride 10 MEQ tablet Commonly known as: KLOR-CON Take 10 mEq by mouth 2 (two) times daily.   PRESCRIPTION MEDICATION CPAP- At bedtime and during any naps   spironolactone 25 MG tablet Commonly known as: ALDACTONE Take 0.5 tablets (12.5 mg total) by mouth daily. Start taking on: June 04, 2020   sucralfate 1 g tablet Commonly known as: CARAFATE Take 1 g by mouth 2 (two) times daily as needed ("for stomach  issues").   Toujeo Max SoloStar 300 UNIT/ML Solostar Pen Generic drug: insulin glargine (2 Unit Dial) Inject 25 Units into the skin at bedtime. What changed: Another medication with the same name was removed. Continue taking this medication, and follow the directions you see here.   triamcinolone cream 0.1 % Commonly known as: KENALOG Apply 1 application topically 2 (two) times daily as needed (for rashes).   vitamin B-12 1000 MCG tablet Commonly known as: CYANOCOBALAMIN Take 1,000 mcg by mouth at bedtime.          Outstanding Labs/Studies   BMP in 1 week.  In specialty clinic.   Echo in 3 months. Follow up with Dr. Tamala Julian in 3 months.    Duration of Discharge Encounter   Greater than 30 minutes including physician time.  Signed, Cecilie Kicks, NP 06/03/2020, 12:44 PM

## 2020-06-03 NOTE — Progress Notes (Signed)
  Mobility Specialist Criteria Algorithm Info.  SATURATION QUALIFICATIONS: (This note is used to comply with regulatory documentation for home oxygen)  Patient Saturations on Room Air at Rest = 100%  Patient Saturations on Room Air while Ambulating = 95%  Patient Saturations on 0 Liters of oxygen while Ambulating = n/a%  Please briefly explain why patient needs home oxygen:  Mobility Team: [facility] elevated:Self regulated Activity: Ambulated in hall (in chair before and after ambulation) Range of motion: Active; All extremities Level of assistance: Standby assist, set-up cues, supervision of patient - no hands on Assistive device: None Minutes sitting in chair:  Minutes stood: 4 minutes Minutes ambulated: 4 minutes Distance ambulated (ft): 240 ft Mobility response: Tolerated well Bed Position: Chair  Patient agreed to participate in mobility this morning. Per pt, he's independent with ADL's, transfers and ambulation. Ambulated in hallway 240 feet at supervision with steady gait. Mentioned having some instability due to neuropathy but no unsteadiness was observed. Tolerated ambulation well without complaint or incident and was left sitting in chair with all needs met.  06/03/2020 10:19 AM

## 2020-06-03 NOTE — Progress Notes (Signed)
D/C instructions given and reviewed. Tele and IV removed, tolerated well. Dressing with spouse and instructed to call when ready for transport OOF.

## 2020-06-03 NOTE — Plan of Care (Signed)

## 2020-06-03 NOTE — TOC Benefit Eligibility Note (Signed)
Transition of Care [facility]) Benefit Eligibility Note    Patient Details  Name: Wesley Winters MRN: [id number] Date of Birth: [birth year]   Medication/Dose: Wesley Winters 10mg .daily for 30  Covered?: Yes  Tier: 3 Drug  Prescription Coverage Preferred Pharmacy: [facility] with Person/Company/Phone Number:: Per Mylinda Latina [employer]) [phone]  Co-Pay: $37.00  Prior Approval: No  Deductible:  (no deductible)       Shelda Altes Phone Number: 06/03/2020, 1:31 PM

## 2020-06-03 NOTE — Progress Notes (Addendum)
Heart Failure Stewardship Pharmacist Progress Note   PCP: [name], MD PCP-Cardiologist: Sinclair Grooms, MD    HPI:  80 yo male with PMH HTN, HLD, OSA, CAD s/p PCI to LAD and RCA in 1998 and 9470, acute systolic HF, and AF of unknown duration on 04/23/20. Recently diagnosed with new systolic HF with most recent ECHO on 05/05/20 with LVEF 20-25% with global hypokinesis which is down from 45-50% in 2020. Admitted with orthopnea, cough, dyspnea on exertion, and 10 lb weight gain. BNP elevated on admit. Started on IV diuresis. He reports that he has an intolerance to Jardiance (jittery, anxious) in the past but is willing to retry medication while in the hospital. He thinks it might not be medication related. S/p successful DCCV to NSR on 05/28/20. Discharging home today.  Current HF Medications: PO furosemide 40 mg BID Carvedilol 3.125 mg BID Losartan 25 mg daily Spironolactone 12.5 mg daily Jardiance 10 mg daily KCl 10 mEq BID  Prior to admission HF Medications: PO furosemide 20 mg daily Carvedilol 3.125 mg BID Benazepril 20 mg daily KCl 10 mEq BID   Pertinent Lab Values: . Serum creatinine 1.33, BUN 27, Potassium 4.1, Sodium 134, Magnesium 1.9 . 05/26/20: BNP 1609  Vital Signs: . Weight: 216 lbs (admission weight: 227.9 lbs) . Blood pressure: 100-110s/60s  . Heart rate: 60s   Medication Assistance / Insurance Benefits Check: Does the patient have prescription insurance?  Yes Type of insurance plan: BCBS Medicare  Does the patient qualify for medication assistance through manufacturers or grants?   Pending . Eligible grants and/or patient assistance programs: Pending . Medication assistance applications in progress: None  . Medication assistance applications approved: None Approved medication assistance renewals will be completed by: Pending  Outpatient Pharmacy:  Prior to admission outpatient pharmacy: [facility] Is the patient willing to use [facility] at discharge?  Yes Is the patient willing to transition their outpatient pharmacy to utilize a [facility] outpatient pharmacy?   Pending    Assessment: 1. Acute on chronic systolic CHF (EF 96-28%), due to ICM. NYHA class II symptoms. - Adequate response to diuretics with 2.2L uop/24 hrs. Weight down 3 more lbs, below dry weight 217 lbs per patient - agree with switching IV furosemide to PO furosemide 40 mg BID - Continue carvedilol 3.125 mg BID - Continue losartan 25 mg daily - Continue Jardiance 10 mg daily - Continue spironolactone 12.5 mg daily    Plan: 1) Medication changes recommended at this time: - Agree with switching IV furosemide to 40 mg PO BID  2) Patient assistance: - Jardiance copay $10/month - Farxiga not on formulary  3)  Education  - To be completed prior to discharge - HF [facility] appointment rescheduled for 06/09/20  Richardine Service, PharmD, BCPS Kerby Nora, PharmD, BCPS Heart Failure Stewardship Pharmacist Phone [phone]

## 2020-06-05 ENCOUNTER — Encounter (HOSPITAL_COMMUNITY): Payer: Medicare Other

## 2020-06-09 ENCOUNTER — Encounter (HOSPITAL_COMMUNITY): Payer: Medicare Other

## 2020-06-09 ENCOUNTER — Telehealth: Payer: Self-pay | Admitting: Interventional Cardiology

## 2020-06-09 DIAGNOSIS — I5023 Acute on chronic systolic (congestive) heart failure: Secondary | ICD-10-CM

## 2020-06-09 MED ORDER — FUROSEMIDE 20 MG PO TABS: 20.0000 mg | ORAL_TABLET | Freq: Every day | ORAL | 3 refills | Status: DC

## 2020-06-09 NOTE — Telephone Encounter (Signed)
Hold potassium supplement, Jardiance, furosemide, and losartan.  Come for basic metabolic panel within the next 48 to 72 hours.  These medications will be reinstituted so do not get rid of them.  Likely outcome will be a much lower dose of diuretic therapy.  For now we will stop furosemide for 48 hours and then resume at 20 mg daily.  That recommendation may change based on the blood test.

## 2020-06-09 NOTE — Telephone Encounter (Signed)
Spoke with wife and she was calling due to hypotension the last several days.  Pt gets very weak after exerting from house to car, legs start to give out and he feels like he may pass out at times.  Has not actually passed out though.  Occasionally gets dizzy.  Denies SOB or CP.  Weight in the hospital on 5/23 was 233lbs.  Wife states weight this morning was 199lbs and daughter verified this was accurate.  Most recent BPs are:  06/01: 110/72 06/02: 91/61 06/03: 100/64 06/04: 111/93 06/05: 113/78 06/06: 86/63  Current BP effecting medications are:  Coreg 3.125 BID Jardiance 10mg  QD Furosemide 40mg  BID Losartan 25mg  QD Spironolactone 12.5mg  QD  Will route to Dr. Tamala Julian for review and advisement.

## 2020-06-09 NOTE — Telephone Encounter (Signed)
Spoke with wife and made her aware of recommendations per Dr. Tamala Julian.  She will bring the pt by tomorrow for quick lab work.  Wife verbalized understanding of instructions and was appreciative for call.

## 2020-06-09 NOTE — Telephone Encounter (Signed)
Pt c/o BP issue: STAT if pt c/o blurred vision, one-sided weakness or slurred speech  1. What are your last 5 BP readings?  06/01: 110/72 06/02: 91/61 06/03: 100/64 06/04: 111/93 06/05: 113/78 06/06: 86/63  2. Are you having any other symptoms (ex. Dizziness, headache, blurred vision, passed out)?  Weakness. Patient's wife states when the patient walks short distances he becomes very weak.  3. What is your BP issue?  BP has been low.

## 2020-06-10 ENCOUNTER — Other Ambulatory Visit: Payer: Self-pay

## 2020-06-10 ENCOUNTER — Other Ambulatory Visit: Payer: Medicare Other

## 2020-06-10 DIAGNOSIS — I5020 Unspecified systolic (congestive) heart failure: Secondary | ICD-10-CM | POA: Diagnosis not present

## 2020-06-10 DIAGNOSIS — I4891 Unspecified atrial fibrillation: Secondary | ICD-10-CM | POA: Diagnosis not present

## 2020-06-10 DIAGNOSIS — I5023 Acute on chronic systolic (congestive) heart failure: Secondary | ICD-10-CM | POA: Diagnosis not present

## 2020-06-11 ENCOUNTER — Telehealth: Payer: Self-pay | Admitting: Physician Assistant

## 2020-06-11 LAB — BASIC METABOLIC PANEL
BUN/Creatinine Ratio: 25 — ABNORMAL HIGH (ref 10–24)
BUN: 31 mg/dL — ABNORMAL HIGH (ref 8–27)
CO2: 26 mmol/L (ref 20–29)
Calcium: 9.2 mg/dL (ref 8.6–10.2)
Chloride: 94 mmol/L — ABNORMAL LOW (ref 96–106)
Creatinine, Ser: 1.23 mg/dL (ref 0.76–1.27)
Glucose: 165 mg/dL — ABNORMAL HIGH (ref 65–99)
Potassium: 4.7 mmol/L (ref 3.5–5.2)
Sodium: 135 mmol/L (ref 134–144)
eGFR: 59 mL/min/{1.73_m2} — ABNORMAL LOW (ref >59)

## 2020-06-11 NOTE — Telephone Encounter (Signed)
Spoke with pt's wife and got him scheduled to come in Friday to see DOD, Dr. Curt Bears.  Wife agreeable to plan.

## 2020-06-11 NOTE — Telephone Encounter (Signed)
Received phone call from Dr. Laurann Montana this morning, he saw the patient yesterday. After recent discharge, patient had hypotension and significant weight loss, in order to avoid dehydration, Dr. Tamala Julian stopped his discharge lasix (40mg  BID) for 2 days then switched to 20mg  daily (starting tomorrow). When he saw Dr. Laurann Montana, his BP was still soft. It was also noted in the office, he has went back into atrial fibrillation despite the recent cardioversion. HR apparently was in the 100-110s range.   I called the patient, instructed the patient to temporarily increase amiodarone to 200mg  BID instead of daily for better rate control (unable to increase coreg due to SBP is the high 90s yesterday). That will hopefully buy [place] enough time to avoid having him back in heart failure.   He will need a earlier visit with either APP or placed on DOD schedule. He is currently scheduled to see me on 6/14, however would prefer not to wait that long. Will need EKG in the office. Instructed wife to bring BP and weight diary.   Triage, please arrange earlier visit and call the patient. Thank you.

## 2020-06-13 ENCOUNTER — Other Ambulatory Visit: Payer: Self-pay

## 2020-06-13 ENCOUNTER — Ambulatory Visit: Payer: Medicare Other | Admitting: Cardiology

## 2020-06-13 ENCOUNTER — Encounter: Payer: Self-pay | Admitting: Cardiology

## 2020-06-13 VITALS — BP 110/74 | HR 113 | Ht 68.0 in | Wt 206.4 lb

## 2020-06-13 DIAGNOSIS — I4819 Other persistent atrial fibrillation: Secondary | ICD-10-CM

## 2020-06-13 MED ORDER — AMIODARONE HCL 200 MG PO TABS: 400.0000 mg | ORAL_TABLET | Freq: Two times a day (BID) | ORAL | 3 refills | Status: DC

## 2020-06-13 NOTE — Progress Notes (Signed)
Electrophysiology Office Note   Date:  06/13/2020   ID:  Wesley Winters, DOB 1940/01/11, MRN [id number]  PCP:  [name], MD  Cardiologist:  Tamala Julian Primary Electrophysiologist:  Sherida Dobkins Meredith Leeds, MD    Chief Complaint: hypotension   History of Present Illness: Wesley Winters is a 80 y.o. male who is being seen today for the evaluation of hypotension at the request of [name], MD. Presenting today for electrophysiology evaluation.  He has a history significant for diabetes, coronary artery disease, hypertension, hyperlipidemia.  He was discharged from the hospital 06/03/2020 after an admission for heart failure.  He was also found to be in atrial fibrillation.  His amiodarone was increased and he was cardioverted.  Since his admission, his blood pressure has been quite low at times.  His heart failure medications were held and he had a BNP that showed stable creatinine.  His potassium was also normal.  Today, he denies symptoms of palpitations, chest pain, shortness of breath, orthopnea, PND, lower extremity edema, claudication, dizziness, presyncope, syncope, bleeding, or neurologic sequela. The patient is tolerating medications without difficulties.  Difficult for weakness, fatigue, and shortness of breath.  He has been weak and fatigued since leaving the hospital.  His amiodarone was increased to 200 mg at twice a day approximately 1 week ago.  He is remained in atrial fibrillation.  He wore a cardiac monitor that showed 100% atrial fibrillation.  He is having difficulty with ambulation around his house due to his level of fatigue.   Past Medical History:  Diagnosis Date   Balanitis    Bladder calculus    Coronary artery disease    stents x3 by Dr Tamala Julian 1998   Depression    Diabetes mellitus without complication ([place])    GERD (gastroesophageal reflux disease)    Hypercholesterolemia    Hypertension    Hypomagnesemia 06/03/2020   Phimosis    Prostate cancer ([place])    Sleep  apnea    wears CPAP nightly   Past Surgical History:  Procedure Laterality Date   BIOPSY  07/10/2019   Procedure: BIOPSY;  Surgeon: Otis Brace, MD;  Location: [facility];  Service: Gastroenterology;;   CARDIOVERSION N/A 05/28/2020   Procedure: CARDIOVERSION;  Surgeon: [name], MD;  Location: [facility];  Service: Cardiovascular;  Laterality: N/A;   CIRCUMCISION     CYSTOSCOPY     ESOPHAGOGASTRODUODENOSCOPY (EGD) WITH PROPOFOL N/A 07/10/2019   Procedure: ESOPHAGOGASTRODUODENOSCOPY (EGD) WITH PROPOFOL;  Surgeon: Otis Brace, MD;  Location: [facility];  Service: Gastroenterology;  Laterality: N/A;   HERNIA REPAIR     umbilical   PROSTATE BIOPSY     PROSTATE BIOPSY     PROSTATE BIOPSY     three cardiac stents       Current Outpatient Medications  Medication Sig Dispense Refill   acetaminophen (TYLENOL) 500 MG tablet Take 500-1,000 mg by mouth 3 (three) times daily as needed for moderate pain, mild pain or headache.     amiodarone (PACERONE) 200 MG tablet Take 2 tablets (400 mg total) by mouth 2 (two) times daily. 120 tablet 3   apixaban (ELIQUIS) 5 MG TABS tablet Take 1 tablet (5 mg total) by mouth 2 (two) times daily. 60 tablet 11   atorvastatin (LIPITOR) 40 MG tablet Take 40 mg by mouth daily.     augmented betamethasone dipropionate (DIPROLENE-AF) 0.05 % cream Apply 1 application topically daily as needed (for rashes).     azelastine (ASTELIN) 0.1 % nasal spray  Place 2 sprays into both nostrils 2 (two) times daily as needed for allergies. Use in each nostril as directed     B-D UF III MINI PEN NEEDLES 31G X 5 MM MISC SMARTSIG:1 SUB-Q Every Night     carvedilol (COREG) 3.125 MG tablet Take 1 tablet (3.125 mg total) by mouth 2 (two) times daily with a meal. 180 tablet 3   cetirizine (ZYRTEC) 10 MG tablet Take 10 mg by mouth in the morning.     cholecalciferol (VITAMIN D3) 25 MCG (1000 UT) tablet Take 1,000 Units by mouth at bedtime.     fluticasone (FLONASE) 50  MCG/ACT nasal spray Place 2 sprays into both nostrils daily as needed for allergies.      furosemide (LASIX) 20 MG tablet Take 1 tablet (20 mg total) by mouth daily. 90 tablet 3   guaiFENesin-dextromethorphan (ROBITUSSIN DM) 100-10 MG/5ML syrup Take 10 mLs by mouth every 4 (four) hours as needed for cough.     metFORMIN (GLUCOPHAGE-XR) 500 MG 24 hr tablet Take 1,000 mg by mouth 2 (two) times daily.   2   mupirocin ointment (BACTROBAN) 2 % APPLY TO RIGHT GREAT TOE AND RIGHT 3RD TOE ONCE DAILY 22 g 0   ONETOUCH ULTRA test strip 1 each 3 (three) times daily.     PRESCRIPTION MEDICATION CPAP- At bedtime and during any naps     Respiratory Therapy Supplies (CARETOUCH 2 CPAP HOSE HANGER) MISC 11cm     sucralfate (CARAFATE) 1 g tablet Take 1 g by mouth 2 (two) times daily as needed ("for stomach issues").     TOUJEO MAX SOLOSTAR 300 UNIT/ML Solostar Pen Inject 25 Units into the skin at bedtime.     triamcinolone cream (KENALOG) 0.1 % Apply 1 application topically 2 (two) times daily as needed (for rashes).     vitamin B-12 (CYANOCOBALAMIN) 1000 MCG tablet Take 1,000 mcg by mouth at bedtime.     No current facility-administered medications for this visit.    Allergies:   Metformin hcl, Other, Amoxicillin, Amoxicillin-pot clavulanate, Empagliflozin, and Januvia [sitagliptin]   Social History:  The patient  reports that he has never smoked. He has never used smokeless tobacco. He reports that he does not drink alcohol and does not use drugs.   Family History:  The patient's family history includes Arrhythmia in his brother and mother; Cancer in his brother; Heart disease in his brother and mother.    ROS:  Please see the history of present illness.   Otherwise, review of systems is positive for none.   All other systems are reviewed and negative.    PHYSICAL EXAM: VS:  BP 110/74   Pulse (!) 113   Ht 5\' 8"  (1.727 m)   Wt 206 lb 6.4 oz (93.6 kg)   SpO2 98%   BMI 31.38 kg/m  , BMI Body mass index  is 31.38 kg/m. GEN: Well nourished, well developed, in no acute distress  HEENT: normal  Neck: no JVD, carotid bruits, or masses Cardiac: Irregular, tachycardic; no murmurs, rubs, or gallops,no edema  Respiratory:  clear to auscultation bilaterally, normal work of breathing GI: soft, nontender, nondistended, + BS MS: no deformity or atrophy  Skin: warm and dry Neuro:  Strength and sensation are intact Psych: euthymic mood, full affect  EKG:  EKG is ordered today. Personal review of the ekg ordered shows AF, rate 113   Recent Labs: 04/23/2020: NT-Pro BNP 2,488 05/26/2020: ALT 27; B Natriuretic Peptide 1,609.8; TSH 4.049 05/30/2020: Magnesium 1.9  06/03/2020: Hemoglobin 10.9; Platelets 255 06/10/2020: BUN 31; Creatinine, Ser 1.23; Potassium 4.7; Sodium 135    Lipid Panel  No results found for: CHOL, TRIG, HDL, CHOLHDL, VLDL, LDLCALC, LDLDIRECT   Wt Readings from Last 3 Encounters:  06/13/20 206 lb 6.4 oz (93.6 kg)  06/03/20 216 lb 9.6 oz (98.2 kg)  05/26/20 233 lb (105.7 kg)      Other studies Reviewed: Additional studies/ records that were reviewed today include: TTE 05/05/20  Review of the above records today demonstrates:   1. Left ventricular ejection fraction, by estimation, is 20 to 25%. The  left ventricle has severely decreased function. The left ventricle  demonstrates global hypokinesis. The left ventricular internal cavity size  was moderately dilated. Left  ventricular diastolic parameters are indeterminate. Elevated left  ventricular end-diastolic pressure.   2. Right ventricular systolic function is moderately reduced. The right  ventricular size is normal. There is mildly elevated pulmonary artery  systolic pressure.   3. Left atrial size was severely dilated.   4. Right atrial size was mildly dilated.   5. The mitral valve is normal in structure. Mild mitral valve  regurgitation. No evidence of mitral stenosis.   6. The aortic valve is normal in structure.  Aortic valve regurgitation is  not visualized. No aortic stenosis is present.   7. The inferior vena cava is normal in size with greater than 50%  respiratory variability, suggesting right atrial pressure of 3 mmHg.   Cardiac monitor 06/08/2020 personally reviewed Continuous Atrial Fibrillation with ave HR 84 bpm PVC burden 4.3% VT occured on 4 occasions with the longest 15 beats at 140 bpm; Fastest 182 bom   ASSESSMENT AND PLAN:  1.  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  Is currently on amiodarone.  Heart failure medications have been held as he was quite hypotensive.  He does not appear to be in heart failure today.  2.  Paroxysmal atrial fibrillation: Continue amiodarone and Eliquis.  High risk medication monitoring.  I do think that he would benefit from getting back into normal rhythm which could get him to feel much better with less fatigue.  We Wesley Winters plan for repeat cardioversion.  In prior to cardioversion, we Wesley Winters increase amiodarone to 400 mg twice daily.  He Wesley Winters follow-up with cardiology after cardioversion.  Amiodarone can be reduced back to 200 mg a day at follow-up.  3.  Coronary artery disease: Continue statin and Eliquis.  No current chest pain.  Case discussed with primary cardiology  Current medicines are reviewed at length with the patient today.   The patient does not have concerns regarding his medicines.  The following changes were made today:  none  Labs/ tests ordered today include:  Orders Placed This Encounter  Procedures   EKG 12-Lead     Disposition:   FU with Wesley Winters 3 months  Signed, Wesley Winters Meredith Leeds, MD  06/13/2020 12:55 PM     [facility] [address] [place] [address] [phone] (office) [phone] (fax) [

## 2020-06-13 NOTE — Patient Instructions (Signed)
Medication Instructions:  Your physician has recommended you make the following change in your medication:  INCREASE Amiodarone to 400 mg TWICE daily  *If you need a refill on your cardiac medications before your next appointment, please call your pharmacy*   Lab Work: None ordered   Testing/Procedures: None ordered   Follow-Up: At [facility], you and your health needs are our priority.  As part of our continuing mission to provide you with exceptional heart care, we have created designated Provider Care Teams.  These Care Teams include your primary Cardiologist (physician) and Advanced Practice Providers (APPs -  Physician Assistants and Nurse Practitioners) who all work together to provide you with the care you need, when you need it.  We recommend signing up for the patient portal called "MyChart".  Sign up information is provided on this After Visit Summary.  MyChart is used to connect with patients for Virtual Visits (Telemedicine).  Patients are able to view lab/test results, encounter notes, upcoming appointments, etc.  Non-urgent messages can be sent to your provider as well.   To learn more about what you can do with MyChart, go to [email or web address].     Your physician recommends that you schedule a follow-up appointment with general cardiology app after 6/28 cardioversion   Your next appointment:   3 month(s)  The format for your next appointment:   In Person  Provider:   Allegra Lai, MD    Thank you for choosing [facility]!!   Trinidad Curet, RN [phone]   Other Instructions   You are scheduled for a Cardioversion on 07/01/2020 with Dr. Acie Fredrickson.  Please arrive at the [facility] (Main Entrance A) at [facility]: [address] [place], [place] [address] at 9:00 am  DIET: Nothing to eat or drink after midnight except a sip of water with medications (see medication instructions below)  FYI: For your safety, and to allow [place] to monitor  your vital signs accurately during the surgery/procedure we request that   if you have artificial nails, gel coating, SNS etc. Please have those removed prior to your surgery/procedure. Not having the nail coverings /polish removed may result in cancellation or delay of your surgery/procedure.  Medication Instructions:  Take 1/2 your usual dose of Toujeo at bedtime the night before this Hold all medications the morning of this procedure, you may take your Eliquis.  Continue your anticoagulant: Eliquis You will need to continue your anticoagulant after your procedure until you are told by your provider that it is safe to stop   Labs: not needed  You must have a responsible person to drive you home and stay in the waiting area during your procedure. Failure to do so could result in cancellation.  Bring your insurance cards.  *Special Note: Every effort is made to have your procedure done on time. Occasionally there are emergencies that occur at the hospital that may cause delays. Please be patient if a delay does occur.

## 2020-06-14 ENCOUNTER — Emergency Department (HOSPITAL_COMMUNITY): Payer: Medicare Other

## 2020-06-14 ENCOUNTER — Encounter (HOSPITAL_COMMUNITY): Payer: Self-pay | Admitting: Internal Medicine

## 2020-06-14 ENCOUNTER — Inpatient Hospital Stay (HOSPITAL_COMMUNITY)
Admission: EM | Admit: 2020-06-14 | Discharge: 2020-07-09 | DRG: 291 | Disposition: A | Discharge status: Skilled Nursing Facility | Payer: Medicare Other | Attending: Internal Medicine | Admitting: Internal Medicine

## 2020-06-14 ENCOUNTER — Telehealth: Payer: Self-pay | Admitting: Cardiology

## 2020-06-14 DIAGNOSIS — L97529 Non-pressure chronic ulcer of other part of left foot with unspecified severity: Secondary | ICD-10-CM | POA: Diagnosis present

## 2020-06-14 DIAGNOSIS — I2510 Atherosclerotic heart disease of native coronary artery without angina pectoris: Secondary | ICD-10-CM | POA: Diagnosis not present

## 2020-06-14 DIAGNOSIS — E871 Hypo-osmolality and hyponatremia: Secondary | ICD-10-CM | POA: Diagnosis present

## 2020-06-14 DIAGNOSIS — Z7901 Long term (current) use of anticoagulants: Secondary | ICD-10-CM | POA: Diagnosis not present

## 2020-06-14 DIAGNOSIS — E669 Obesity, unspecified: Secondary | ICD-10-CM | POA: Diagnosis present

## 2020-06-14 DIAGNOSIS — R32 Unspecified urinary incontinence: Secondary | ICD-10-CM | POA: Diagnosis not present

## 2020-06-14 DIAGNOSIS — L89312 Pressure ulcer of right buttock, stage 2: Secondary | ICD-10-CM | POA: Diagnosis not present

## 2020-06-14 DIAGNOSIS — I083 Combined rheumatic disorders of mitral, aortic and tricuspid valves: Secondary | ICD-10-CM | POA: Diagnosis present

## 2020-06-14 DIAGNOSIS — E11621 Type 2 diabetes mellitus with foot ulcer: Secondary | ICD-10-CM | POA: Diagnosis present

## 2020-06-14 DIAGNOSIS — R233 Spontaneous ecchymoses: Secondary | ICD-10-CM | POA: Diagnosis not present

## 2020-06-14 DIAGNOSIS — I700 Atherosclerosis of aorta: Secondary | ICD-10-CM | POA: Diagnosis not present

## 2020-06-14 DIAGNOSIS — W19XXXA Unspecified fall, initial encounter: Secondary | ICD-10-CM | POA: Diagnosis present

## 2020-06-14 DIAGNOSIS — R531 Weakness: Secondary | ICD-10-CM

## 2020-06-14 DIAGNOSIS — E1122 Type 2 diabetes mellitus with diabetic chronic kidney disease: Secondary | ICD-10-CM | POA: Diagnosis present

## 2020-06-14 DIAGNOSIS — I480 Paroxysmal atrial fibrillation: Secondary | ICD-10-CM

## 2020-06-14 DIAGNOSIS — Z95828 Presence of other vascular implants and grafts: Secondary | ICD-10-CM

## 2020-06-14 DIAGNOSIS — R918 Other nonspecific abnormal finding of lung field: Secondary | ICD-10-CM | POA: Diagnosis not present

## 2020-06-14 DIAGNOSIS — E1165 Type 2 diabetes mellitus with hyperglycemia: Secondary | ICD-10-CM | POA: Diagnosis not present

## 2020-06-14 DIAGNOSIS — I5022 Chronic systolic (congestive) heart failure: Secondary | ICD-10-CM | POA: Diagnosis not present

## 2020-06-14 DIAGNOSIS — E119 Type 2 diabetes mellitus without complications: Secondary | ICD-10-CM

## 2020-06-14 DIAGNOSIS — E7800 Pure hypercholesterolemia, unspecified: Secondary | ICD-10-CM | POA: Diagnosis present

## 2020-06-14 DIAGNOSIS — D509 Iron deficiency anemia, unspecified: Secondary | ICD-10-CM | POA: Diagnosis present

## 2020-06-14 DIAGNOSIS — K761 Chronic passive congestion of liver: Secondary | ICD-10-CM | POA: Diagnosis present

## 2020-06-14 DIAGNOSIS — Z809 Family history of malignant neoplasm, unspecified: Secondary | ICD-10-CM

## 2020-06-14 DIAGNOSIS — N179 Acute kidney failure, unspecified: Secondary | ICD-10-CM | POA: Diagnosis not present

## 2020-06-14 DIAGNOSIS — I5021 Acute systolic (congestive) heart failure: Secondary | ICD-10-CM | POA: Diagnosis not present

## 2020-06-14 DIAGNOSIS — F32A Depression, unspecified: Secondary | ICD-10-CM | POA: Diagnosis present

## 2020-06-14 DIAGNOSIS — I5043 Acute on chronic combined systolic (congestive) and diastolic (congestive) heart failure: Secondary | ICD-10-CM | POA: Diagnosis not present

## 2020-06-14 DIAGNOSIS — N170 Acute kidney failure with tubular necrosis: Secondary | ICD-10-CM | POA: Diagnosis present

## 2020-06-14 DIAGNOSIS — I4891 Unspecified atrial fibrillation: Secondary | ICD-10-CM | POA: Diagnosis not present

## 2020-06-14 DIAGNOSIS — R0602 Shortness of breath: Secondary | ICD-10-CM

## 2020-06-14 DIAGNOSIS — I509 Heart failure, unspecified: Secondary | ICD-10-CM | POA: Diagnosis not present

## 2020-06-14 DIAGNOSIS — D631 Anemia in chronic kidney disease: Secondary | ICD-10-CM | POA: Diagnosis present

## 2020-06-14 DIAGNOSIS — I361 Nonrheumatic tricuspid (valve) insufficiency: Secondary | ICD-10-CM | POA: Diagnosis not present

## 2020-06-14 DIAGNOSIS — R570 Cardiogenic shock: Secondary | ICD-10-CM | POA: Diagnosis not present

## 2020-06-14 DIAGNOSIS — E875 Hyperkalemia: Secondary | ICD-10-CM | POA: Diagnosis present

## 2020-06-14 DIAGNOSIS — Z7984 Long term (current) use of oral hypoglycemic drugs: Secondary | ICD-10-CM

## 2020-06-14 DIAGNOSIS — Z66 Do not resuscitate: Secondary | ICD-10-CM | POA: Diagnosis present

## 2020-06-14 DIAGNOSIS — I5023 Acute on chronic systolic (congestive) heart failure: Secondary | ICD-10-CM | POA: Diagnosis not present

## 2020-06-14 DIAGNOSIS — L97519 Non-pressure chronic ulcer of other part of right foot with unspecified severity: Secondary | ICD-10-CM | POA: Diagnosis not present

## 2020-06-14 DIAGNOSIS — D72829 Elevated white blood cell count, unspecified: Secondary | ICD-10-CM

## 2020-06-14 DIAGNOSIS — N390 Urinary tract infection, site not specified: Secondary | ICD-10-CM | POA: Diagnosis present

## 2020-06-14 DIAGNOSIS — Z452 Encounter for adjustment and management of vascular access device: Secondary | ICD-10-CM

## 2020-06-14 DIAGNOSIS — A4159 Other Gram-negative sepsis: Secondary | ICD-10-CM | POA: Diagnosis not present

## 2020-06-14 DIAGNOSIS — Y92009 Unspecified place in unspecified non-institutional (private) residence as the place of occurrence of the external cause: Secondary | ICD-10-CM | POA: Diagnosis not present

## 2020-06-14 DIAGNOSIS — I130 Hypertensive heart and chronic kidney disease with heart failure and stage 1 through stage 4 chronic kidney disease, or unspecified chronic kidney disease: Secondary | ICD-10-CM | POA: Diagnosis not present

## 2020-06-14 DIAGNOSIS — S80212A Abrasion, left knee, initial encounter: Secondary | ICD-10-CM | POA: Diagnosis present

## 2020-06-14 DIAGNOSIS — Z515 Encounter for palliative care: Secondary | ICD-10-CM | POA: Diagnosis not present

## 2020-06-14 DIAGNOSIS — Z955 Presence of coronary angioplasty implant and graft: Secondary | ICD-10-CM

## 2020-06-14 DIAGNOSIS — I4819 Other persistent atrial fibrillation: Secondary | ICD-10-CM | POA: Diagnosis not present

## 2020-06-14 DIAGNOSIS — E0810 Diabetes mellitus due to underlying condition with ketoacidosis without coma: Secondary | ICD-10-CM | POA: Diagnosis not present

## 2020-06-14 DIAGNOSIS — G4733 Obstructive sleep apnea (adult) (pediatric): Secondary | ICD-10-CM | POA: Diagnosis present

## 2020-06-14 DIAGNOSIS — R509 Fever, unspecified: Secondary | ICD-10-CM | POA: Diagnosis not present

## 2020-06-14 DIAGNOSIS — Z20822 Contact with and (suspected) exposure to covid-19: Secondary | ICD-10-CM | POA: Diagnosis present

## 2020-06-14 DIAGNOSIS — R310 Gross hematuria: Secondary | ICD-10-CM | POA: Diagnosis not present

## 2020-06-14 DIAGNOSIS — L8990 Pressure ulcer of unspecified site, unspecified stage: Secondary | ICD-10-CM | POA: Insufficient documentation

## 2020-06-14 DIAGNOSIS — K7200 Acute and subacute hepatic failure without coma: Secondary | ICD-10-CM | POA: Diagnosis present

## 2020-06-14 DIAGNOSIS — Z8249 Family history of ischemic heart disease and other diseases of the circulatory system: Secondary | ICD-10-CM

## 2020-06-14 DIAGNOSIS — N200 Calculus of kidney: Secondary | ICD-10-CM | POA: Diagnosis not present

## 2020-06-14 DIAGNOSIS — Z794 Long term (current) use of insulin: Secondary | ICD-10-CM

## 2020-06-14 DIAGNOSIS — N184 Chronic kidney disease, stage 4 (severe): Secondary | ICD-10-CM | POA: Diagnosis present

## 2020-06-14 DIAGNOSIS — E860 Dehydration: Secondary | ICD-10-CM | POA: Diagnosis present

## 2020-06-14 DIAGNOSIS — R21 Rash and other nonspecific skin eruption: Secondary | ICD-10-CM | POA: Diagnosis present

## 2020-06-14 DIAGNOSIS — Z9989 Dependence on other enabling machines and devices: Secondary | ICD-10-CM | POA: Diagnosis not present

## 2020-06-14 DIAGNOSIS — E876 Hypokalemia: Secondary | ICD-10-CM | POA: Diagnosis not present

## 2020-06-14 DIAGNOSIS — J90 Pleural effusion, not elsewhere classified: Secondary | ICD-10-CM | POA: Diagnosis not present

## 2020-06-14 DIAGNOSIS — Z6835 Body mass index (BMI) 35.0-35.9, adult: Secondary | ICD-10-CM | POA: Diagnosis not present

## 2020-06-14 DIAGNOSIS — L89322 Pressure ulcer of left buttock, stage 2: Secondary | ICD-10-CM | POA: Diagnosis not present

## 2020-06-14 DIAGNOSIS — I459 Conduction disorder, unspecified: Secondary | ICD-10-CM | POA: Diagnosis present

## 2020-06-14 DIAGNOSIS — E11610 Type 2 diabetes mellitus with diabetic neuropathic arthropathy: Secondary | ICD-10-CM | POA: Diagnosis not present

## 2020-06-14 DIAGNOSIS — F419 Anxiety disorder, unspecified: Secondary | ICD-10-CM | POA: Diagnosis present

## 2020-06-14 DIAGNOSIS — N400 Enlarged prostate without lower urinary tract symptoms: Secondary | ICD-10-CM | POA: Diagnosis not present

## 2020-06-14 DIAGNOSIS — Z880 Allergy status to penicillin: Secondary | ICD-10-CM

## 2020-06-14 DIAGNOSIS — K219 Gastro-esophageal reflux disease without esophagitis: Secondary | ICD-10-CM | POA: Diagnosis present

## 2020-06-14 DIAGNOSIS — Z888 Allergy status to other drugs, medicaments and biological substances status: Secondary | ICD-10-CM

## 2020-06-14 DIAGNOSIS — R296 Repeated falls: Secondary | ICD-10-CM | POA: Diagnosis present

## 2020-06-14 DIAGNOSIS — E1142 Type 2 diabetes mellitus with diabetic polyneuropathy: Secondary | ICD-10-CM | POA: Diagnosis present

## 2020-06-14 DIAGNOSIS — K141 Geographic tongue: Secondary | ICD-10-CM | POA: Diagnosis present

## 2020-06-14 DIAGNOSIS — M47814 Spondylosis without myelopathy or radiculopathy, thoracic region: Secondary | ICD-10-CM | POA: Diagnosis not present

## 2020-06-14 DIAGNOSIS — Z7189 Other specified counseling: Secondary | ICD-10-CM | POA: Diagnosis not present

## 2020-06-14 DIAGNOSIS — E872 Acidosis: Secondary | ICD-10-CM | POA: Diagnosis not present

## 2020-06-14 DIAGNOSIS — I517 Cardiomegaly: Secondary | ICD-10-CM | POA: Diagnosis not present

## 2020-06-14 DIAGNOSIS — I5042 Chronic combined systolic (congestive) and diastolic (congestive) heart failure: Secondary | ICD-10-CM | POA: Diagnosis not present

## 2020-06-14 DIAGNOSIS — Z6831 Body mass index (BMI) 31.0-31.9, adult: Secondary | ICD-10-CM

## 2020-06-14 DIAGNOSIS — Z8546 Personal history of malignant neoplasm of prostate: Secondary | ICD-10-CM

## 2020-06-14 DIAGNOSIS — G4730 Sleep apnea, unspecified: Secondary | ICD-10-CM | POA: Diagnosis not present

## 2020-06-14 DIAGNOSIS — E878 Other disorders of electrolyte and fluid balance, not elsewhere classified: Secondary | ICD-10-CM | POA: Diagnosis not present

## 2020-06-14 DIAGNOSIS — Z79899 Other long term (current) drug therapy: Secondary | ICD-10-CM

## 2020-06-14 DIAGNOSIS — I428 Other cardiomyopathies: Secondary | ICD-10-CM | POA: Diagnosis present

## 2020-06-14 DIAGNOSIS — I340 Nonrheumatic mitral (valve) insufficiency: Secondary | ICD-10-CM | POA: Diagnosis not present

## 2020-06-14 DIAGNOSIS — Z043 Encounter for examination and observation following other accident: Secondary | ICD-10-CM | POA: Diagnosis not present

## 2020-06-14 DIAGNOSIS — B964 Proteus (mirabilis) (morganii) as the cause of diseases classified elsewhere: Secondary | ICD-10-CM | POA: Diagnosis not present

## 2020-06-14 LAB — URINALYSIS, ROUTINE W REFLEX MICROSCOPIC
Bacteria, UA: NONE SEEN
Bilirubin Urine: NEGATIVE
Glucose, UA: NEGATIVE mg/dL
Ketones, ur: NEGATIVE mg/dL
Nitrite: NEGATIVE
Protein, ur: 100 mg/dL — AB
RBC / HPF: >50 RBC/hpf — ABNORMAL HIGH (ref 0–5)
Specific Gravity, Urine: 1.016 (ref 1.005–1.030)
pH: 5 (ref 5.0–8.0)

## 2020-06-14 LAB — CBC WITH DIFFERENTIAL/PLATELET
Abs Immature Granulocytes: 0.03 10*3/uL (ref 0.00–0.07)
Basophils Absolute: 0 10*3/uL (ref 0.0–0.1)
Basophils Relative: 0 %
Eosinophils Absolute: 0 10*3/uL (ref 0.0–0.5)
Eosinophils Relative: 0 %
HCT: 36.6 % — ABNORMAL LOW (ref 39.0–52.0)
Hemoglobin: 10.8 g/dL — ABNORMAL LOW (ref 13.0–17.0)
Immature Granulocytes: 0 %
Lymphocytes Relative: 11 %
Lymphs Abs: 0.9 10*3/uL (ref 0.7–4.0)
MCH: 24.2 pg — ABNORMAL LOW (ref 26.0–34.0)
MCHC: 29.5 g/dL — ABNORMAL LOW (ref 30.0–36.0)
MCV: 81.9 fL (ref 80.0–100.0)
Monocytes Absolute: 0.7 10*3/uL (ref 0.1–1.0)
Monocytes Relative: 9 %
Neutro Abs: 6.4 10*3/uL (ref 1.7–7.7)
Neutrophils Relative %: 80 %
Platelets: 329 10*3/uL (ref 150–400)
RBC: 4.47 MIL/uL (ref 4.22–5.81)
RDW: 19 % — ABNORMAL HIGH (ref 11.5–15.5)
WBC: 8 10*3/uL (ref 4.0–10.5)
nRBC: 0 % (ref 0.0–0.2)

## 2020-06-14 LAB — COMPREHENSIVE METABOLIC PANEL
ALT: 87 U/L — ABNORMAL HIGH (ref 0–44)
AST: 110 U/L — ABNORMAL HIGH (ref 15–41)
Albumin: 3 g/dL — ABNORMAL LOW (ref 3.5–5.0)
Alkaline Phosphatase: 80 U/L (ref 38–126)
Anion gap: 17 — ABNORMAL HIGH (ref 5–15)
BUN: 68 mg/dL — ABNORMAL HIGH (ref 8–23)
CO2: 19 mmol/L — ABNORMAL LOW (ref 22–32)
Calcium: 8.7 mg/dL — ABNORMAL LOW (ref 8.9–10.3)
Chloride: 95 mmol/L — ABNORMAL LOW (ref 98–111)
Creatinine, Ser: 2.64 mg/dL — ABNORMAL HIGH (ref 0.61–1.24)
GFR, Estimated: 23 mL/min — ABNORMAL LOW (ref >60)
Glucose, Bld: 186 mg/dL — ABNORMAL HIGH (ref 70–99)
Potassium: 5.2 mmol/L — ABNORMAL HIGH (ref 3.5–5.1)
Sodium: 131 mmol/L — ABNORMAL LOW (ref 135–145)
Total Bilirubin: 1.2 mg/dL (ref 0.3–1.2)
Total Protein: 6.7 g/dL (ref 6.5–8.1)

## 2020-06-14 LAB — TSH: TSH: 7.588 u[IU]/mL — ABNORMAL HIGH (ref 0.350–4.500)

## 2020-06-14 LAB — POTASSIUM: Potassium: 5.5 mmol/L — ABNORMAL HIGH (ref 3.5–5.1)

## 2020-06-14 LAB — MAGNESIUM: Magnesium: 2.1 mg/dL (ref 1.7–2.4)

## 2020-06-14 LAB — LACTIC ACID, PLASMA: Lactic Acid, Venous: 8.5 mmol/L (ref 0.5–1.9)

## 2020-06-14 LAB — RESP PANEL BY RT-PCR (FLU A&B, COVID) ARPGX2
Influenza A by PCR: NEGATIVE
Influenza B by PCR: NEGATIVE
SARS Coronavirus 2 by RT PCR: NEGATIVE

## 2020-06-14 LAB — TROPONIN I (HIGH SENSITIVITY): Troponin I (High Sensitivity): 65 ng/L — ABNORMAL HIGH (ref <18)

## 2020-06-14 LAB — CBG MONITORING, ED: Glucose-Capillary: 130 mg/dL — ABNORMAL HIGH (ref 70–99)

## 2020-06-14 MED ORDER — ATORVASTATIN CALCIUM 40 MG PO TABS: 40.0000 mg | ORAL_TABLET | Freq: Every day | ORAL | Status: DC

## 2020-06-14 MED ORDER — INSULIN GLARGINE 100 UNIT/ML ~~LOC~~ SOLN
10.0000 [IU] | Freq: Every day | SUBCUTANEOUS | Status: DC
  Administered 2020-06-14: 10 [IU] via SUBCUTANEOUS
  Filled 2020-06-14: qty 0.1

## 2020-06-14 MED ORDER — SODIUM CHLORIDE 0.9 % IV SOLN
250.0000 mL | INTRAVENOUS | Status: DC
  Administered 2020-06-15: 20 mL via INTRAVENOUS
  Administered 2020-06-18 – 2020-06-20 (×2): 250 mL via INTRAVENOUS

## 2020-06-14 MED ORDER — AMIODARONE HCL 200 MG PO TABS
400.0000 mg | ORAL_TABLET | Freq: Two times a day (BID) | ORAL | Status: DC
  Administered 2020-06-14 – 2020-06-15 (×2): 400 mg via ORAL
  Filled 2020-06-14 (×2): qty 2

## 2020-06-14 MED ORDER — CARVEDILOL 3.125 MG PO TABS: 3.1250 mg | ORAL_TABLET | Freq: Two times a day (BID) | ORAL | Status: DC

## 2020-06-14 MED ORDER — FUROSEMIDE 10 MG/ML IJ SOLN
15.0000 mg/h | INTRAVENOUS | Status: DC
  Administered 2020-06-15: 10 mg/h via INTRAVENOUS
  Filled 2020-06-14 (×3): qty 20

## 2020-06-14 MED ORDER — INSULIN ASPART 100 UNIT/ML IJ SOLN
0.0000 [IU] | Freq: Three times a day (TID) | INTRAMUSCULAR | Status: DC
  Administered 2020-06-15: 1 [IU] via SUBCUTANEOUS
  Administered 2020-06-15: 3 [IU] via SUBCUTANEOUS
  Administered 2020-06-15 – 2020-06-16 (×2): 2 [IU] via SUBCUTANEOUS
  Administered 2020-06-16: 1 [IU] via SUBCUTANEOUS
  Administered 2020-06-17 (×2): 2 [IU] via SUBCUTANEOUS
  Administered 2020-06-17: 1 [IU] via SUBCUTANEOUS
  Administered 2020-06-18: 5 [IU] via SUBCUTANEOUS
  Administered 2020-06-18: 3 [IU] via SUBCUTANEOUS
  Administered 2020-06-18: 1 [IU] via SUBCUTANEOUS
  Administered 2020-06-19: 3 [IU] via SUBCUTANEOUS
  Administered 2020-06-19: 5 [IU] via SUBCUTANEOUS
  Administered 2020-06-19: 9 [IU] via SUBCUTANEOUS

## 2020-06-14 MED ORDER — PHENYLEPHRINE HCL-NACL 10-0.9 MG/250ML-% IV SOLN
25.0000 ug/min | INTRAVENOUS | Status: DC
  Filled 2020-06-14: qty 250

## 2020-06-14 MED ORDER — FUROSEMIDE 10 MG/ML IJ SOLN
120.0000 mg | Freq: Once | INTRAVENOUS | Status: AC
  Administered 2020-06-15: 120 mg via INTRAVENOUS
  Filled 2020-06-14: qty 12

## 2020-06-14 MED ORDER — PHENYLEPHRINE HCL-NACL 10-0.9 MG/250ML-% IV SOLN: 0.0000 ug/min | INTRAVENOUS | Status: DC

## 2020-06-14 MED ORDER — VITAMIN B-12 1000 MCG PO TABS
1000.0000 ug | ORAL_TABLET | Freq: Every day | ORAL | Status: DC
  Administered 2020-06-14 – 2020-07-09 (×26): 1000 ug via ORAL
  Filled 2020-06-14 (×26): qty 1

## 2020-06-14 MED ORDER — SODIUM CHLORIDE 0.9 % IV BOLUS
250.0000 mL | Freq: Once | INTRAVENOUS | Status: AC
  Administered 2020-06-14: 250 mL via INTRAVENOUS

## 2020-06-14 MED ORDER — APIXABAN 5 MG PO TABS
5.0000 mg | ORAL_TABLET | Freq: Two times a day (BID) | ORAL | Status: DC
  Administered 2020-06-14 – 2020-06-15 (×2): 5 mg via ORAL
  Filled 2020-06-14 (×2): qty 1

## 2020-06-14 NOTE — ED Triage Notes (Signed)
Pt reports recent hospitalization for afib. Reports he fell last night in the bathroom and today in his room. Weakness to bilateral legs. Abrasion to left knee.

## 2020-06-14 NOTE — ED Notes (Signed)
Pt just discharged   Pt reports very weak  Especially in his legs  He fell yesterday and 2 times  Today  The pts wife at the bedside reports that  She  Had to call ems out earlier this am  But this afternoon she was able to get the pt up with the help of a neighbor

## 2020-06-14 NOTE — ED Provider Notes (Signed)
[facility] EMERGENCY DEPARTMENT Provider Note   CSN: [id number] Arrival date & time: 06/14/20  1319     History Chief Complaint  Patient presents with   Weakness    Wesley Winters is a 80 y.o. male.  The history is provided by the patient and the spouse.      Wesley Winters is a 80 y.o. male, with a history of CAD, DM, GERD, hypercholesterolemia, HTN, A. fib, heart failure with EF 20 to 25%, presenting to the ED with generalized weakness worsening over the last week. He endorses 2 falls over the last 24 hours, one last night and one this morning.  He thinks he may have hit his head when he fell last night.  He also notes an abrasion to the left knee. He thinks he is falling because he just feels weak and his legs cannot support him.  He thinks perhaps his A. fib or change in his medications for his A. fib have caused him to be weak. Patient's amiodarone was increased to 200 mg twice daily (previously 200 mg once daily) on June 8. He endorses poor appetite and poor oral intake. He describes a vague sensation of discomfort in his chest that he thinks may feel like burning, but he is not sure. He also describes a vague sensation of fullness and pressure in the abdomen. He last took all his medications this morning. Denies fever/chills, syncope, dizziness, shortness of breath, orthopnea, cough, diarrhea, hematochezia/melena, dysuria, hematuria, neck/back pain, other extremity injuries, or any other complaints.   Past Medical History:  Diagnosis Date   Balanitis    Bladder calculus    Coronary artery disease    stents x3 by Dr Tamala Julian 1998   Depression    Diabetes mellitus without complication ([place])    GERD (gastroesophageal reflux disease)    Hypercholesterolemia    Hypertension    Hypomagnesemia 06/03/2020   Phimosis    Prostate cancer ([place])    Sleep apnea    wears CPAP nightly    Patient Active Problem List   Diagnosis Date Noted   ARF (acute renal  failure) ([place]) 06/14/2020   Hypomagnesemia 79/02/4095   Acute systolic heart failure ([place]) 05/26/2020   Persistent atrial fibrillation ([place]) 05/26/2020   Chronic anticoagulation 05/26/2020   CAD in native artery 02/22/2016   Hyperlipidemia LDL goal <70 02/22/2016   Malignant neoplasm of prostate ([place]) 11/04/2014   Diabetes ([place]) 11/04/2014    Past Surgical History:  Procedure Laterality Date   BIOPSY  07/10/2019   Procedure: BIOPSY;  Surgeon: Otis Brace, MD;  Location: [facility];  Service: Gastroenterology;;   CARDIOVERSION N/A 05/28/2020   Procedure: CARDIOVERSION;  Surgeon: [name], MD;  Location: [place];  Service: Cardiovascular;  Laterality: N/A;   CIRCUMCISION     CYSTOSCOPY     ESOPHAGOGASTRODUODENOSCOPY (EGD) WITH PROPOFOL N/A 07/10/2019   Procedure: ESOPHAGOGASTRODUODENOSCOPY (EGD) WITH PROPOFOL;  Surgeon: Otis Brace, MD;  Location: [facility];  Service: Gastroenterology;  Laterality: N/A;   HERNIA REPAIR     umbilical   PROSTATE BIOPSY     PROSTATE BIOPSY     PROSTATE BIOPSY     three cardiac stents         Family History  Problem Relation Age of Onset   Cancer Brother        prostate   Heart disease Brother    Heart disease Mother    Arrhythmia Mother    Arrhythmia Brother  Social History   Tobacco Use   Smoking status: Never   Smokeless tobacco: Never  Substance Use Topics   Alcohol use: No   Drug use: No    Home Medications Prior to Admission medications   Medication Sig Start Date End Date Taking? Authorizing Provider  acetaminophen (TYLENOL) 500 MG tablet Take 500-1,000 mg by mouth 3 (three) times daily as needed for moderate pain, mild pain or headache.   Yes [provider]  amiodarone (PACERONE) 200 MG tablet Take 2 tablets (400 mg total) by mouth 2 (two) times daily. 06/13/20  Yes Camnitz, Ocie Doyne, MD  apixaban (ELIQUIS) 5 MG TABS tablet Take 1 tablet (5 mg total) by mouth 2 (two) times daily.  04/23/20  Yes Belva Crome, MD  atorvastatin (LIPITOR) 40 MG tablet Take 40 mg by mouth daily.   Yes [provider]  azelastine (ASTELIN) 0.1 % nasal spray Place 2 sprays into both nostrils 2 (two) times daily as needed for allergies. Use in each nostril as directed   Yes [provider]  B-D UF III MINI PEN NEEDLES 31G X 5 MM MISC 1 each by Other route every evening. 10/01/19  Yes [provider]  carvedilol (COREG) 3.125 MG tablet Take 1 tablet (3.125 mg total) by mouth 2 (two) times daily with a meal. 05/12/20  Yes Belva Crome, MD  cetirizine (ZYRTEC) 10 MG tablet Take 10 mg by mouth in the morning. 03/28/20  Yes [provider]  cholecalciferol (VITAMIN D3) 25 MCG (1000 UT) tablet Take 1,000 Units by mouth at bedtime.   Yes [provider]  fluticasone (FLONASE) 50 MCG/ACT nasal spray Place 2 sprays into both nostrils daily as needed for allergies.    Yes [provider]  furosemide (LASIX) 20 MG tablet Take 1 tablet (20 mg total) by mouth daily. 06/09/20  Yes Belva Crome, MD  metFORMIN (GLUCOPHAGE-XR) 500 MG 24 hr tablet Take 1,000 mg by mouth 2 (two) times daily.  11/11/14  Yes [provider]  mupirocin ointment (BACTROBAN) 2 % APPLY TO RIGHT GREAT TOE AND RIGHT 3RD TOE ONCE DAILY Patient taking differently: Apply 1 application topically See admin instructions. Apply to right great toe and right 3rd toe once daily 11/14/19  Yes [place], Stephani Police, DPM  ONETOUCH ULTRA test strip 1 each 3 (three) times daily. 08/15/19  Yes [provider]  PRESCRIPTION MEDICATION CPAP- At bedtime and during any naps   Yes [provider]  TOUJEO MAX SOLOSTAR 300 UNIT/ML Solostar Pen Inject 25 Units into the skin at bedtime.   Yes [provider]  vitamin B-12 (CYANOCOBALAMIN) 1000 MCG tablet Take 1,000 mcg by mouth at bedtime.   Yes [provider]    Allergies    Metformin hcl, Other, Amoxicillin,  Amoxicillin-pot clavulanate, Empagliflozin, and Januvia [sitagliptin]  Review of Systems   Review of Systems  Constitutional:  Positive for fatigue. Negative for diaphoresis and fever.  Respiratory:  Negative for cough and shortness of breath.   Cardiovascular:  Negative for chest pain.  Gastrointestinal:  Negative for abdominal pain, blood in stool, diarrhea, nausea and vomiting.  Genitourinary:  Negative for difficulty urinating, dysuria, flank pain, frequency and hematuria.  Musculoskeletal:  Negative for arthralgias, back pain and neck pain.  Neurological:  Positive for weakness. Negative for dizziness, seizures, syncope, light-headedness, numbness and headaches.  All other systems reviewed and are negative.  Physical Exam Updated Vital Signs BP (!) 123/111   Pulse 65  Temp (!) 97.5 F (36.4 C) (Oral)   Resp 18   Ht 5\' 8"  (1.727 m)   Wt 93.6 kg   SpO2 100%   BMI 31.38 kg/m   Physical Exam Vitals and nursing note reviewed.  Constitutional:      General: He is not in acute distress.    Appearance: He is well-developed. He is not diaphoretic.  HENT:     Head: Normocephalic and atraumatic.     Mouth/Throat:     Mouth: Mucous membranes are moist.     Pharynx: Oropharynx is clear.  Eyes:     Conjunctiva/sclera: Conjunctivae normal.  Cardiovascular:     Rate and Rhythm: Normal rate. Rhythm irregularly irregular.     Pulses: Normal pulses.          Radial pulses are 2+ on the right side and 2+ on the left side.       Posterior tibial pulses are 2+ on the right side and 2+ on the left side.     Heart sounds: Normal heart sounds.     Comments: Tactile temperature in the extremities appropriate and equal bilaterally. Pulmonary:     Effort: Pulmonary effort is normal. No respiratory distress.     Breath sounds: Normal breath sounds.  Abdominal:     Palpations: Abdomen is soft.     Tenderness: There is no abdominal tenderness. There is no guarding.  Musculoskeletal:      Cervical back: Normal range of motion and neck supple. No tenderness.     Right lower leg: 1+ Edema present.     Left lower leg: 1+ Edema present.     Comments: No midline spinal tenderness. Superficial abrasions to the left anterior knee. Each of the patient's extremities was inspected, palpated, and joints were ranged without additional evidence of injury, pain, deformity, instability.  Lymphadenopathy:     Cervical: No cervical adenopathy.  Skin:    General: Skin is warm and dry.  Neurological:     Mental Status: He is alert and oriented to person, place, and time.     Comments: No noted acute cognitive deficit. Sensation grossly intact to light touch in the extremities.   Grip strengths equal bilaterally.   Strength 4/5 in all extremities.  Coordination intact.  Cranial nerves III-XII grossly intact.  Handles oral secretions without noted difficulty.  No noted phonation or speech deficit. No facial droop.   Psychiatric:        Mood and Affect: Mood and affect normal.        Speech: Speech normal.        Behavior: Behavior normal.    ED Results / Procedures / Treatments   Labs (all labs ordered are listed, but only abnormal results are displayed) Labs Reviewed  CBC WITH DIFFERENTIAL/PLATELET - Abnormal; Notable for the following components:      Result Value   Hemoglobin 10.8 (*)    HCT 36.6 (*)    MCH 24.2 (*)    MCHC 29.5 (*)    RDW 19.0 (*)    All other components within normal limits  COMPREHENSIVE METABOLIC PANEL - Abnormal; Notable for the following components:   Sodium 131 (*)    Potassium 5.2 (*)    Chloride 95 (*)    CO2 19 (*)    Glucose, Bld 186 (*)    BUN 68 (*)    Creatinine, Ser 2.64 (*)    Calcium 8.7 (*)    Albumin 3.0 (*)    AST  110 (*)    ALT 87 (*)    GFR, Estimated 23 (*)    Anion gap 17 (*)    All other components within normal limits  URINALYSIS, ROUTINE W REFLEX MICROSCOPIC - Abnormal; Notable for the following components:   Color, Urine  AMBER (*)    APPearance CLOUDY (*)    Hgb urine dipstick LARGE (*)    Protein, ur 100 (*)    Leukocytes,Ua SMALL (*)    RBC / HPF >50 (*)    All other components within normal limits  POTASSIUM - Abnormal; Notable for the following components:   Potassium 5.5 (*)    All other components within normal limits  RESP PANEL BY RT-PCR (FLU A&B, COVID) ARPGX2  MAGNESIUM    EKG EKG Interpretation  Date/Time:  Saturday June 14 2020 13:24:36 EDT Ventricular Rate:  96 PR Interval:    QRS Duration: 118 QT Interval:  378 QTC Calculation: 477 R Axis:   270 Text Interpretation: Atrial fibrillation Low voltage QRS Non-specific intra-ventricular conduction delay Confirmed by Lajean Saver [phone]) on 06/14/2020 3:45:50 PM  Radiology CT ABDOMEN PELVIS WO CONTRAST  Result Date: 06/14/2020 CLINICAL DATA:  Abdominal pain and distension. Recent hospitalization for atrial fibrillation. Fell last night and today. Bilateral leg weakness. EXAM: CT CHEST, ABDOMEN AND PELVIS WITHOUT CONTRAST TECHNIQUE: Multidetector CT imaging of the chest, abdomen and pelvis was performed following the standard protocol without IV contrast. COMPARISON:  Pelvis CT dated 10/15/2014. Chest radiographs obtained earlier today. FINDINGS: CT CHEST FINDINGS Cardiovascular: Enlarged heart. Atheromatous calcifications, including the coronary arteries and aorta. Mediastinum/Nodes: No enlarged mediastinal, hilar, or axillary lymph nodes. Thyroid gland, trachea, and esophagus demonstrate no significant findings. Lungs/Pleura: Small left upper lobe calcified granuloma. Normal vascularity. No airspace consolidation or pleural fluid. Musculoskeletal: Thoracic and lower cervical spine degenerative changes. No fractures. CT ABDOMEN PELVIS FINDINGS Hepatobiliary: Mildly heterogeneous, medium density the in the gallbladder. No gallbladder wall thickening or pericholecystic fluid seen. Unremarkable liver. Pancreas: Moderate pancreatic atrophy. Spleen:  Normal in size and shape. Small probable cyst or hemangioma. Small calcified granuloma. Adrenals/Urinary Tract: Bilateral renal cysts. The largest is in the lower pole of the right kidney and measures 7 cm in maximum diameter with a small amount of thin wall calcification. Adjacent calculi in the upper pole of the right kidney, the largest measuring 1.4 cm. 1.0 cm left renal pelvis calculus. No hydronephrosis. Normal appearing ureters. Poorly distended urinary bladder. Stomach/Bowel: Unremarkable stomach, small bowel and colon. No evidence of appendicitis. Vascular/Lymphatic: Mildly prominent porta hepatis and portacaval lymph nodes with no pathologically enlarged nodes. Atheromatous arterial calcifications. Reproductive: Markedly enlarged prostate gland containing several small surgical clips or radioactive seeds. Other: Small amount of free peritoneal fluid. Mild subcutaneous edema. Musculoskeletal: Lumbar spine degenerative changes. IMPRESSION: 1. No acute abnormality. 2. Bilateral nonobstructing renal calculi. 3. Probable borderline reactive adenopathy in the right upper abdomen. 4.  Calcific coronary artery and aortic atherosclerosis. 5. Cardiomegaly. 6. Markedly enlarged prostate gland. Electronically Signed   By: Claudie Revering M.D.   On: 06/14/2020 17:50   DG Chest 2 View  Result Date: 06/14/2020 CLINICAL DATA:  Shortness of breath and weakness.  Falling. EXAM: CHEST - 2 VIEW COMPARISON:  05/27/2020 FINDINGS: Lungs are adequately inflated with resolution of previously seen small left effusion. No acute airspace process. Mild stable cardiomegaly. Remainder of the exam is unchanged. IMPRESSION: 1. No acute cardiopulmonary disease. 2. Mild stable cardiomegaly. Electronically Signed   By: Marin Olp M.D.   On: 06/14/2020  14:01   CT Head Wo Contrast  Result Date: 06/14/2020 CLINICAL DATA:  Fall. EXAM: CT HEAD WITHOUT CONTRAST TECHNIQUE: Contiguous axial images were obtained from the base of the skull  through the vertex without intravenous contrast. COMPARISON:  None. FINDINGS: Brain: No evidence of acute infarction, hemorrhage, hydrocephalus, extra-axial collection or mass lesion/mass effect. Mild generalized cerebral atrophy, within normal limits for age. Scattered mild periventricular and subcortical white matter hypodensities are nonspecific, but favored to reflect chronic microvascular ischemic changes. Vascular: Calcified atherosclerosis at the skullbase. No hyperdense vessel. Skull: Normal. Negative for fracture or focal lesion. Sinuses/Orbits: No acute finding. Other: None. IMPRESSION: 1. No acute intracranial abnormality. 2. Mild chronic microvascular ischemic changes. Electronically Signed   By: Titus Dubin M.D.   On: 06/14/2020 17:35   CT Chest Wo Contrast  Result Date: 06/14/2020 CLINICAL DATA:  Abdominal pain and distension. Recent hospitalization for atrial fibrillation. Fell last night and today. Bilateral leg weakness. EXAM: CT CHEST, ABDOMEN AND PELVIS WITHOUT CONTRAST TECHNIQUE: Multidetector CT imaging of the chest, abdomen and pelvis was performed following the standard protocol without IV contrast. COMPARISON:  Pelvis CT dated 10/15/2014. Chest radiographs obtained earlier today. FINDINGS: CT CHEST FINDINGS Cardiovascular: Enlarged heart. Atheromatous calcifications, including the coronary arteries and aorta. Mediastinum/Nodes: No enlarged mediastinal, hilar, or axillary lymph nodes. Thyroid gland, trachea, and esophagus demonstrate no significant findings. Lungs/Pleura: Small left upper lobe calcified granuloma. Normal vascularity. No airspace consolidation or pleural fluid. Musculoskeletal: Thoracic and lower cervical spine degenerative changes. No fractures. CT ABDOMEN PELVIS FINDINGS Hepatobiliary: Mildly heterogeneous, medium density the in the gallbladder. No gallbladder wall thickening or pericholecystic fluid seen. Unremarkable liver. Pancreas: Moderate pancreatic atrophy.  Spleen: Normal in size and shape. Small probable cyst or hemangioma. Small calcified granuloma. Adrenals/Urinary Tract: Bilateral renal cysts. The largest is in the lower pole of the right kidney and measures 7 cm in maximum diameter with a small amount of thin wall calcification. Adjacent calculi in the upper pole of the right kidney, the largest measuring 1.4 cm. 1.0 cm left renal pelvis calculus. No hydronephrosis. Normal appearing ureters. Poorly distended urinary bladder. Stomach/Bowel: Unremarkable stomach, small bowel and colon. No evidence of appendicitis. Vascular/Lymphatic: Mildly prominent porta hepatis and portacaval lymph nodes with no pathologically enlarged nodes. Atheromatous arterial calcifications. Reproductive: Markedly enlarged prostate gland containing several small surgical clips or radioactive seeds. Other: Small amount of free peritoneal fluid. Mild subcutaneous edema. Musculoskeletal: Lumbar spine degenerative changes. IMPRESSION: 1. No acute abnormality. 2. Bilateral nonobstructing renal calculi. 3. Probable borderline reactive adenopathy in the right upper abdomen. 4.  Calcific coronary artery and aortic atherosclerosis. 5. Cardiomegaly. 6. Markedly enlarged prostate gland. Electronically Signed   By: Claudie Revering M.D.   On: 06/14/2020 17:50    Procedures Procedures   Medications Ordered in ED Medications  sodium chloride 0.9 % bolus 250 mL (has no administration in time range)    ED Course  I have reviewed the triage vital signs and the nursing notes.  Pertinent labs & imaging results that were available during my care of the patient were reviewed by me and considered in my medical decision making (see chart for details).  Clinical Course as of 06/14/20 1953  Sat Jun 14, 2020  1615 Spoke with Dr. Johney Frame, cardiology.  Recommends medicine admission.  Cardiology can follow. [SJ]  [place] with Dr. Hal Hope, hospitalist. Agrees to admit the patient.  [SJ]     Clinical Course User Index [SJ] Babette Stum, Helane Gunther, PA-C   MDM Rules/Calculators/A&P  Patient presents with generalized weakness over the past several days, resulting in 2 falls over the last 24 hours. Patient is nontoxic appearing, afebrile, not tachycardic, not tachypneic, not hypotensive, maintains excellent SPO2 on room air, and is in no apparent distress.   I have reviewed the patient's chart to obtain more information.   I reviewed and interpreted the patient's labs and radiological studies. Patient has Hyponatremia, hyperchloremia, Decreased CO2, mildly elevated anion gap, as well as evidence of AKI with elevated creatinine and BUN.  Suspect patient is dehydrated due to his poor oral intake.  Hyperkalemia also noted. Patient had to be rehydrated cautiously due to his EF of 20 to 25%. Imaging studies without acute, pertinent abnormalities. There was a great deal of variation in the patient's heart rate during his ED course. There were delays in nursing staff carrying out orders due to the number of critical and unstable patients in the department at the same time as this patient.  Patient admitted for further management.  Findings and plan of care discussed with attending physician, Lajean Saver, MD.    Final Clinical Impression(s) / ED Diagnoses Final diagnoses:  Weakness  AKI (acute kidney injury) [facility])    Rx / [place] Orders ED Discharge Orders     None        Layla Maw 06/14/20 1953    Lajean Saver, MD 06/14/20 2015

## 2020-06-14 NOTE — ED Notes (Signed)
Frequent calls from the pt about everything.

## 2020-06-14 NOTE — ED Provider Notes (Signed)
Emergency Medicine Provider Triage Evaluation Note  Wesley Winters , a 80 y.o. male  was evaluated in triage.  Pt complains of weakness in both legs when he tried to stand and walk. 2 falls at home. Hit back of head. On Eliquis for afib. Seen yesterday by cardiology.   Review of Systems  Positive: Leg weakness SOB (chronic) cough  Negative: CP  Physical Exam  BP (!) 123/111   Pulse 65   Temp (!) 97.5 F (36.4 C) (Oral)   Resp 18   Ht 5\' 8"  (1.727 m)   Wt 93.6 kg   SpO2 100%   BMI 31.38 kg/m  Gen:   Awake, no distress   Resp:  Normal effort  MSK:   Moves extremities without difficulty  Other:  Equal strength upper/lower extremities, no facial droop  Medical Decision Making  Medically screening exam initiated at 1:32 PM.  Appropriate orders placed.    Patient made aware this encounter is a triage and screening encounter and no beds are immediately available at this time in the ER.  Patient was informed that the remainder of the evaluation will be completed by another provider.  Patient made aware triage orders have been placed and patient will be placed in the waiting room while work up is initiated and until a room becomes available. Patient encouraged to await a formal ER encounter with a clinician.  Patient made aware that exiting the department prior to formal encounter with an ER clinician and completion of the work-up is considered leaving against medical advice.  At that time there is no guarantee that there are no emergency medical conditions present and patient assumes risks of leaving including worsening condition, permanent disability and death. Patient verbalizes understanding.     Kinnie Feil, PA-C 06/14/20 1333    Davonna Belling, MD 06/14/20 1943

## 2020-06-14 NOTE — ED Notes (Signed)
To ct

## 2020-06-14 NOTE — Progress Notes (Signed)
   06/14/20 2306  Assess: MEWS Score  Temp (!) 96.7 F (35.9 C)  BP (!) 83/69  Pulse Rate 97  ECG Heart Rate (!) 102  Resp (!) 27  SpO2 97 %  O2 Device Room Air  Assess: MEWS Score  MEWS Temp 1  MEWS Systolic 1  MEWS Pulse 1  MEWS RR 2  MEWS LOC 0  MEWS Score 5  MEWS Score Color Red  Assess: if the MEWS score is Yellow or Red  Were vital signs taken at a resting state? Yes  Focused Assessment Change from prior assessment (see assessment flowsheet)  Early Detection of Sepsis Score *See Row Information* High  MEWS guidelines implemented *See Row Information* Yes  Treat  MEWS Interventions Escalated (See documentation below)  Take Vital Signs  Increase Vital Sign Frequency  Red: Q 1hr X 4 then Q 4hr X 4, if remains red, continue Q 4hrs  Escalate  MEWS: Escalate Red: discuss with charge nurse/RN and provider, consider discussing with RRT  Notify: Charge Nurse/RN  Name of Charge Nurse/RN Notified Jill, RN  Date Charge Nurse/RN Notified 06/14/20  Time Charge Nurse/RN Notified 2310  Notify: Provider  Provider Name/Title [place] [place]  Date Provider Notified 06/14/20  Time Provider Notified 2315  Notification Type Face-to-face  Notification Reason Change in status;Critical result  Provider response At bedside;See new orders  Date of Provider Response 06/14/20  Time of Provider Response 2316  Notify: Rapid Response  Name of Rapid Response RN Notified Mindy, RN  Date Rapid Response Notified 06/14/20  Time Rapid Response Notified 2315  Document  Patient Outcome Transferred/level of care increased  Progress note created (see row info) Yes

## 2020-06-14 NOTE — Telephone Encounter (Signed)
Patient wife called in reporting he had a fall yesterday and then again this morning. Was seen in the office yesterday with Dr. Curt Bears with recommendations to increase amiodarone. Other BP meds have been scaled back. Continues on coreg 3.125mg  BID. Wife denies any injury with the falls but has had trouble getting him up off the floor. Reports BP has been low in the 709 range systolic. He is very weak as well. With concern regarding symptoms and medications, I advised would be best that he be evaluated to determine what to do with medications, whether he needs cardioversion sooner. Rule out other causes? She voiced understanding and thanked me for callback.

## 2020-06-14 NOTE — H&P (Addendum)
History and Physical    Wesley Winters YCX:[id number] DOB: March 20, 1940 DOA: 06/14/2020  PCP: [name], MD  Patient coming from: Home.  Chief Complaint: Weakness fatigue.  HPI: Wesley Winters is a 80 y.o. male with history of cardiomyopathy with EF of 25% likely tachycardia related with history of A. fib, CAD status post stenting, diabetes mellitus was recently admitted for CHF and also underwent cardioversion discharged on Jun 03, 2020 about 10 days ago has been having poor appetite and weakness also had a fall yesterday.  Denies losing consciousness.  Has been having some chest pressure off and on.  Complaining of the weakness patient was brought to the ER.  Patient states he has been having poor appetite at times nausea but denies any abdominal pain.  Denies any diarrhea.  ED Course: In the ER patient was found to have intermittent episodes of A. fib with RVR labs are significant for creatinine worsening from 1.2 about 4 days ago and it is around 2.6.  LFTs are elevated at AST of 110 ALT of 87 total bilirubin 1.2.  CBC is around baseline.  Patient was given to 50 cc normal saline bolus given that patient had acute renal failure.  Abdomen appears benign.  COVID test was negative.  Patient underwent CT head and CT chest abdomen pelvis which does not show any acute.  Review of Systems: As per HPI, rest all negative.   Past Medical History:  Diagnosis Date   Balanitis    Bladder calculus    Coronary artery disease    stents x3 by Dr Tamala Julian 1998   Depression    Diabetes mellitus without complication ([place])    GERD (gastroesophageal reflux disease)    Hypercholesterolemia    Hypertension    Hypomagnesemia 06/03/2020   Phimosis    Prostate cancer ([place])    Sleep apnea    wears CPAP nightly    Past Surgical History:  Procedure Laterality Date   BIOPSY  07/10/2019   Procedure: BIOPSY;  Surgeon: Otis Brace, MD;  Location: [facility];  Service: Gastroenterology;;   CARDIOVERSION N/A  05/28/2020   Procedure: CARDIOVERSION;  Surgeon: [name], MD;  Location: [place];  Service: Cardiovascular;  Laterality: N/A;   CIRCUMCISION     CYSTOSCOPY     ESOPHAGOGASTRODUODENOSCOPY (EGD) WITH PROPOFOL N/A 07/10/2019   Procedure: ESOPHAGOGASTRODUODENOSCOPY (EGD) WITH PROPOFOL;  Surgeon: Otis Brace, MD;  Location: [facility];  Service: Gastroenterology;  Laterality: N/A;   HERNIA REPAIR     umbilical   PROSTATE BIOPSY     PROSTATE BIOPSY     PROSTATE BIOPSY     three cardiac stents       reports that he has never smoked. He has never used smokeless tobacco. He reports that he does not drink alcohol and does not use drugs.  Allergies  Allergen Reactions   Metformin Hcl Diarrhea   Other Other (See Comments) and Cough    Pollen- Chest congestion, also   Amoxicillin Hives, Nausea Only and Rash   Amoxicillin-Pot Clavulanate Rash   Empagliflozin Nausea Only, Anxiety and Other (See Comments)    Made the patient jittery and nauseous   Januvia [Sitagliptin] Anxiety    Family History  Problem Relation Age of Onset   Cancer Brother        prostate   Heart disease Brother    Heart disease Mother    Arrhythmia Mother    Arrhythmia Brother     Prior to Admission medications  Medication Sig Start Date End Date Taking? Authorizing Provider  acetaminophen (TYLENOL) 500 MG tablet Take 500-1,000 mg by mouth 3 (three) times daily as needed for moderate pain, mild pain or headache.   Yes [provider]  amiodarone (PACERONE) 200 MG tablet Take 2 tablets (400 mg total) by mouth 2 (two) times daily. 06/13/20  Yes Camnitz, Ocie Doyne, MD  apixaban (ELIQUIS) 5 MG TABS tablet Take 1 tablet (5 mg total) by mouth 2 (two) times daily. 04/23/20  Yes Belva Crome, MD  atorvastatin (LIPITOR) 40 MG tablet Take 40 mg by mouth daily.   Yes [provider]  azelastine (ASTELIN) 0.1 % nasal spray Place 2 sprays into both nostrils 2 (two) times daily as needed for  allergies. Use in each nostril as directed   Yes [provider]  B-D UF III MINI PEN NEEDLES 31G X 5 MM MISC 1 each by Other route every evening. 10/01/19  Yes [provider]  carvedilol (COREG) 3.125 MG tablet Take 1 tablet (3.125 mg total) by mouth 2 (two) times daily with a meal. 05/12/20  Yes Belva Crome, MD  cetirizine (ZYRTEC) 10 MG tablet Take 10 mg by mouth in the morning. 03/28/20  Yes [provider]  cholecalciferol (VITAMIN D3) 25 MCG (1000 UT) tablet Take 1,000 Units by mouth at bedtime.   Yes [provider]  fluticasone (FLONASE) 50 MCG/ACT nasal spray Place 2 sprays into both nostrils daily as needed for allergies.    Yes [provider]  furosemide (LASIX) 20 MG tablet Take 1 tablet (20 mg total) by mouth daily. 06/09/20  Yes Belva Crome, MD  metFORMIN (GLUCOPHAGE-XR) 500 MG 24 hr tablet Take 1,000 mg by mouth 2 (two) times daily.  11/11/14  Yes [provider]  mupirocin ointment (BACTROBAN) 2 % APPLY TO RIGHT GREAT TOE AND RIGHT 3RD TOE ONCE DAILY Patient taking differently: Apply 1 application topically See admin instructions. Apply to right great toe and right 3rd toe once daily 11/14/19  Yes [place], Stephani Police, DPM  ONETOUCH ULTRA test strip 1 each 3 (three) times daily. 08/15/19  Yes [provider]  PRESCRIPTION MEDICATION CPAP- At bedtime and during any naps   Yes [provider]  TOUJEO MAX SOLOSTAR 300 UNIT/ML Solostar Pen Inject 25 Units into the skin at bedtime.   Yes [provider]  vitamin B-12 (CYANOCOBALAMIN) 1000 MCG tablet Take 1,000 mcg by mouth at bedtime.   Yes [provider]    Physical Exam: Constitutional: Moderately built and nourished. Vitals:   06/14/20 1328 06/14/20 1700 06/14/20 1730 06/14/20 1745  BP:  118/84 113/81 101/80  Pulse:  (!) 125 (!) 124 (!) 130  Resp:  (!) 22 20 (!) 25  Temp:      TempSrc:      SpO2:  98% 98% 99%  Weight: 93.6 kg     Height:  5\' 8"  (1.727 m)      Eyes: Anicteric no pallor. ENMT: No discharge from the ears eyes nose and mouth. Neck: No mass felt.  No neck rigidity. Respiratory: No rhonchi or crepitations. Cardiovascular: S1-S2 heard. Abdomen: Soft nontender bowel sounds present. Musculoskeletal: No edema. Skin: No rash. Neurologic: Alert awake oriented time place and person.  Moves all extremities. Psychiatric: Appears normal.  Normal affect.   Labs on Admission: I have personally reviewed following labs and imaging studies  CBC: Recent Labs  Lab 06/14/20 1330  WBC 8.0  NEUTROABS 6.4  HGB 10.8*  HCT 36.6*  MCV 81.9  PLT 622   Basic Metabolic Panel: Recent Labs  Lab 06/10/20 0000 06/14/20 1330 06/14/20 1543  NA 135 131*  --   K 4.7 5.2* 5.5*  CL 94* 95*  --   CO2 26 19*  --   GLUCOSE 165* 186*  --   BUN 31* 68*  --   CREATININE 1.23 2.64*  --   CALCIUM 9.2 8.7*  --   MG  --  2.1  --    GFR: Estimated Creatinine Clearance: 24 mL/min (A) (by C-G formula based on SCr of 2.64 mg/dL (H)). Liver Function Tests: Recent Labs  Lab 06/14/20 1330  AST 110*  ALT 87*  ALKPHOS 80  BILITOT 1.2  PROT 6.7  ALBUMIN 3.0*   No results for input(s): LIPASE, AMYLASE in the last 168 hours. No results for input(s): AMMONIA in the last 168 hours. Coagulation Profile: No results for input(s): INR, PROTIME in the last 168 hours. Cardiac Enzymes: No results for input(s): CKTOTAL, CKMB, CKMBINDEX, TROPONINI in the last 168 hours. BNP (last 3 results) Recent Labs    07/11/19 1107 04/23/20 1446  PROBNP 445 2,488*   HbA1C: No results for input(s): HGBA1C in the last 72 hours. CBG: No results for input(s): GLUCAP in the last 168 hours. Lipid Profile: No results for input(s): CHOL, HDL, LDLCALC, TRIG, CHOLHDL, LDLDIRECT in the last 72 hours. Thyroid Function Tests: No results for input(s): TSH, T4TOTAL, FREET4, T3FREE, THYROIDAB in the last 72 hours. Anemia Panel: No results for input(s):  VITAMINB12, FOLATE, FERRITIN, TIBC, IRON, RETICCTPCT in the last 72 hours. Urine analysis:    Component Value Date/Time   COLORURINE AMBER (A) 06/14/2020 1529   APPEARANCEUR CLOUDY (A) 06/14/2020 1529   LABSPEC 1.016 06/14/2020 1529   PHURINE 5.0 06/14/2020 1529   GLUCOSEU NEGATIVE 06/14/2020 1529   HGBUR LARGE (A) 06/14/2020 1529   BILIRUBINUR NEGATIVE 06/14/2020 1529   KETONESUR NEGATIVE 06/14/2020 1529   PROTEINUR 100 (A) 06/14/2020 1529   NITRITE NEGATIVE 06/14/2020 1529   LEUKOCYTESUR SMALL (A) 06/14/2020 1529   Sepsis Labs: @LABRCNTIP (procalcitonin:4,lacticidven:4) ) Recent Results (from the past 240 hour(s))  Resp Panel by RT-PCR (Flu A&B, Covid) Nasopharyngeal Swab     Status: None   Collection Time: 06/14/20  3:31 PM   Specimen: Nasopharyngeal Swab; Nasopharyngeal(NP) swabs in vial transport medium  Result Value Ref Range Status   SARS Coronavirus 2 by RT PCR NEGATIVE NEGATIVE Final    Comment: (NOTE) SARS-CoV-2 target nucleic acids are NOT DETECTED.  The SARS-CoV-2 RNA is generally detectable in upper respiratory specimens during the acute phase of infection. The lowest concentration of SARS-CoV-2 viral copies this assay can detect is 138 copies/mL. A negative result does not preclude SARS-Cov-2 infection and should not be used as the sole basis for treatment or other patient management decisions. A negative result may occur with  improper specimen collection/handling, submission of specimen other than nasopharyngeal swab, presence of viral mutation(s) within the areas targeted by this assay, and inadequate number of viral copies(<138 copies/mL). A negative result must be combined with clinical observations, patient history, and epidemiological information. The expected result is Negative.  Fact Sheet for Patients:  EntrepreneurPulse.com.au  Fact Sheet for Healthcare Providers:  IncredibleEmployment.be  This test is no t yet  approved or cleared by the [place] FDA and  has been authorized for detection and/or diagnosis of SARS-CoV-2 by FDA under an Emergency Use Authorization (EUA). This EUA will remain  in effect (meaning this  test can be used) for the duration of the COVID-19 declaration under Section 564(b)(1) of the Act, 21 U.S.C.section 360bbb-3(b)(1), unless the authorization is terminated  or revoked sooner.       Influenza A by PCR NEGATIVE NEGATIVE Final   Influenza B by PCR NEGATIVE NEGATIVE Final    Comment: (NOTE) The Xpert Xpress SARS-CoV-2/FLU/RSV plus assay is intended as an aid in the diagnosis of influenza from Nasopharyngeal swab specimens and should not be used as a sole basis for treatment. Nasal washings and aspirates are unacceptable for Xpert Xpress SARS-CoV-2/FLU/RSV testing.  Fact Sheet for Patients: EntrepreneurPulse.com.au  Fact Sheet for Healthcare Providers: IncredibleEmployment.be  This test is not yet approved or cleared by the [place] FDA and has been authorized for detection and/or diagnosis of SARS-CoV-2 by FDA under an Emergency Use Authorization (EUA). This EUA will remain in effect (meaning this test can be used) for the duration of the COVID-19 declaration under Section 564(b)(1) of the Act, 21 U.S.C. section 360bbb-3(b)(1), unless the authorization is terminated or revoked.  Performed at [facility], [place] [address] [place], [place] [address]      Radiological Exams on Admission: CT ABDOMEN PELVIS WO CONTRAST  Result Date: 06/14/2020 CLINICAL DATA:  Abdominal pain and distension. Recent hospitalization for atrial fibrillation. Fell last night and today. Bilateral leg weakness. EXAM: CT CHEST, ABDOMEN AND PELVIS WITHOUT CONTRAST TECHNIQUE: Multidetector CT imaging of the chest, abdomen and pelvis was performed following the standard protocol without IV contrast. COMPARISON:  Pelvis CT dated 10/15/2014.  Chest radiographs obtained earlier today. FINDINGS: CT CHEST FINDINGS Cardiovascular: Enlarged heart. Atheromatous calcifications, including the coronary arteries and aorta. Mediastinum/Nodes: No enlarged mediastinal, hilar, or axillary lymph nodes. Thyroid gland, trachea, and esophagus demonstrate no significant findings. Lungs/Pleura: Small left upper lobe calcified granuloma. Normal vascularity. No airspace consolidation or pleural fluid. Musculoskeletal: Thoracic and lower cervical spine degenerative changes. No fractures. CT ABDOMEN PELVIS FINDINGS Hepatobiliary: Mildly heterogeneous, medium density the in the gallbladder. No gallbladder wall thickening or pericholecystic fluid seen. Unremarkable liver. Pancreas: Moderate pancreatic atrophy. Spleen: Normal in size and shape. Small probable cyst or hemangioma. Small calcified granuloma. Adrenals/Urinary Tract: Bilateral renal cysts. The largest is in the lower pole of the right kidney and measures 7 cm in maximum diameter with a small amount of thin wall calcification. Adjacent calculi in the upper pole of the right kidney, the largest measuring 1.4 cm. 1.0 cm left renal pelvis calculus. No hydronephrosis. Normal appearing ureters. Poorly distended urinary bladder. Stomach/Bowel: Unremarkable stomach, small bowel and colon. No evidence of appendicitis. Vascular/Lymphatic: Mildly prominent porta hepatis and portacaval lymph nodes with no pathologically enlarged nodes. Atheromatous arterial calcifications. Reproductive: Markedly enlarged prostate gland containing several small surgical clips or radioactive seeds. Other: Small amount of free peritoneal fluid. Mild subcutaneous edema. Musculoskeletal: Lumbar spine degenerative changes. IMPRESSION: 1. No acute abnormality. 2. Bilateral nonobstructing renal calculi. 3. Probable borderline reactive adenopathy in the right upper abdomen. 4.  Calcific coronary artery and aortic atherosclerosis. 5. Cardiomegaly. 6.  Markedly enlarged prostate gland. Electronically Signed   By: Claudie Revering M.D.   On: 06/14/2020 17:50   DG Chest 2 View  Result Date: 06/14/2020 CLINICAL DATA:  Shortness of breath and weakness.  Falling. EXAM: CHEST - 2 VIEW COMPARISON:  05/27/2020 FINDINGS: Lungs are adequately inflated with resolution of previously seen small left effusion. No acute airspace process. Mild stable cardiomegaly. Remainder of the exam is unchanged. IMPRESSION: 1. No acute cardiopulmonary disease. 2. Mild stable cardiomegaly.  Electronically Signed   By: Marin Olp M.D.   On: 06/14/2020 14:01   CT Head Wo Contrast  Result Date: 06/14/2020 CLINICAL DATA:  Fall. EXAM: CT HEAD WITHOUT CONTRAST TECHNIQUE: Contiguous axial images were obtained from the base of the skull through the vertex without intravenous contrast. COMPARISON:  None. FINDINGS: Brain: No evidence of acute infarction, hemorrhage, hydrocephalus, extra-axial collection or mass lesion/mass effect. Mild generalized cerebral atrophy, within normal limits for age. Scattered mild periventricular and subcortical white matter hypodensities are nonspecific, but favored to reflect chronic microvascular ischemic changes. Vascular: Calcified atherosclerosis at the skullbase. No hyperdense vessel. Skull: Normal. Negative for fracture or focal lesion. Sinuses/Orbits: No acute finding. Other: None. IMPRESSION: 1. No acute intracranial abnormality. 2. Mild chronic microvascular ischemic changes. Electronically Signed   By: Titus Dubin M.D.   On: 06/14/2020 17:35   CT Chest Wo Contrast  Result Date: 06/14/2020 CLINICAL DATA:  Abdominal pain and distension. Recent hospitalization for atrial fibrillation. Fell last night and today. Bilateral leg weakness. EXAM: CT CHEST, ABDOMEN AND PELVIS WITHOUT CONTRAST TECHNIQUE: Multidetector CT imaging of the chest, abdomen and pelvis was performed following the standard protocol without IV contrast. COMPARISON:  Pelvis CT dated  10/15/2014. Chest radiographs obtained earlier today. FINDINGS: CT CHEST FINDINGS Cardiovascular: Enlarged heart. Atheromatous calcifications, including the coronary arteries and aorta. Mediastinum/Nodes: No enlarged mediastinal, hilar, or axillary lymph nodes. Thyroid gland, trachea, and esophagus demonstrate no significant findings. Lungs/Pleura: Small left upper lobe calcified granuloma. Normal vascularity. No airspace consolidation or pleural fluid. Musculoskeletal: Thoracic and lower cervical spine degenerative changes. No fractures. CT ABDOMEN PELVIS FINDINGS Hepatobiliary: Mildly heterogeneous, medium density the in the gallbladder. No gallbladder wall thickening or pericholecystic fluid seen. Unremarkable liver. Pancreas: Moderate pancreatic atrophy. Spleen: Normal in size and shape. Small probable cyst or hemangioma. Small calcified granuloma. Adrenals/Urinary Tract: Bilateral renal cysts. The largest is in the lower pole of the right kidney and measures 7 cm in maximum diameter with a small amount of thin wall calcification. Adjacent calculi in the upper pole of the right kidney, the largest measuring 1.4 cm. 1.0 cm left renal pelvis calculus. No hydronephrosis. Normal appearing ureters. Poorly distended urinary bladder. Stomach/Bowel: Unremarkable stomach, small bowel and colon. No evidence of appendicitis. Vascular/Lymphatic: Mildly prominent porta hepatis and portacaval lymph nodes with no pathologically enlarged nodes. Atheromatous arterial calcifications. Reproductive: Markedly enlarged prostate gland containing several small surgical clips or radioactive seeds. Other: Small amount of free peritoneal fluid. Mild subcutaneous edema. Musculoskeletal: Lumbar spine degenerative changes. IMPRESSION: 1. No acute abnormality. 2. Bilateral nonobstructing renal calculi. 3. Probable borderline reactive adenopathy in the right upper abdomen. 4.  Calcific coronary artery and aortic atherosclerosis. 5.  Cardiomegaly. 6. Markedly enlarged prostate gland. Electronically Signed   By: Claudie Revering M.D.   On: 06/14/2020 17:50    EKG: Independently reviewed.  A. fib with RVR.  Assessment/Plan Principal Problem:   ARF (acute renal failure) (HCC) Active Problems:   Diabetes (HCC)   CAD in native artery   Persistent atrial fibrillation (HCC)   Chronic anticoagulation    Acute on chronic kidney disease stage IV likely worsened because of poor oral intake and patient in addition has been taking diuretics.  Patient did receive to 50 cc normal saline bolus in the ER.  We will hold off patient's diuretics.  Closely follow intake output metabolic panel. A. fib with RVR patient going in and out of A. fib with RVR.  We will continue patient's amiodarone and Coreg.  Patient  is also on Eliquis.  Check TSH.  We will closely monitor. Fatigue and weakness likely from dehydration.  Closely monitor.  Holding diuretics for now.  Check lactic acid levels. Elevated LFTs with abdominal pain benign CT scan abdomen does not show any acute.  We will hold off Lipitor.  If LFTs worsen.  Hold amiodarone.  We will continue to trend LFTs.  Angiographically hepatitis panel.  No definite evidence of any cardiogenic shock at this time. History of cardiomyopathy last EF was 25% likely tachycardia induced.  Holding diuretics due to renal failure and fatigue and weakness.  Monitor respiratory status. History of CAD status post stenting does complain of some substernal chest pressure off and on last few months.  Will trend cardiac markers. Diabetes mellitus type 2 we will decrease patient's Toujeo dose from 25 to 20 units because of renal failure and poor oral intake. Anemia follow CBC.   Given that patient has acute renal failure.  Fatigue weakness A. fib with RVR will need close monitoring.  Further worsening in inpatient status.  Addendum -patient's blood pressure dropped to the 77A systolic lactic acid of 8 at this point  patient's symptoms are concerning for cardiogenic shock and have discussed with pulmonary critical care who will be seeing patient in consult we will start Neo-Synephrine for now.  Further recommendations per pulmonary critical care.  DVT prophylaxis: Eliquis. Code Status: Full code. Family Communication: We will need to discuss with family. Disposition Plan: Home. Consults called: Cardiology. Admission status: Inpatient.   Rise Patience MD [facility] Pager [phone].  If 7PM-7AM, please contact night-coverage [email or web address] Password [facility]  06/14/2020, 9:13 PM

## 2020-06-15 ENCOUNTER — Inpatient Hospital Stay (HOSPITAL_COMMUNITY): Payer: Medicare Other

## 2020-06-15 DIAGNOSIS — R570 Cardiogenic shock: Secondary | ICD-10-CM

## 2020-06-15 DIAGNOSIS — Z7901 Long term (current) use of anticoagulants: Secondary | ICD-10-CM | POA: Diagnosis not present

## 2020-06-15 DIAGNOSIS — I5023 Acute on chronic systolic (congestive) heart failure: Secondary | ICD-10-CM

## 2020-06-15 DIAGNOSIS — E0810 Diabetes mellitus due to underlying condition with ketoacidosis without coma: Secondary | ICD-10-CM | POA: Diagnosis not present

## 2020-06-15 DIAGNOSIS — N179 Acute kidney failure, unspecified: Secondary | ICD-10-CM | POA: Diagnosis not present

## 2020-06-15 DIAGNOSIS — R531 Weakness: Secondary | ICD-10-CM

## 2020-06-15 DIAGNOSIS — I2510 Atherosclerotic heart disease of native coronary artery without angina pectoris: Secondary | ICD-10-CM | POA: Diagnosis not present

## 2020-06-15 LAB — COMPREHENSIVE METABOLIC PANEL
ALT: 239 U/L — ABNORMAL HIGH (ref 0–44)
ALT: 1397 U/L — ABNORMAL HIGH (ref 0–44)
AST: 342 U/L — ABNORMAL HIGH (ref 15–41)
AST: 3005 U/L — ABNORMAL HIGH (ref 15–41)
Albumin: 3 g/dL — ABNORMAL LOW (ref 3.5–5.0)
Albumin: 2.8 g/dL — ABNORMAL LOW (ref 3.5–5.0)
Alkaline Phosphatase: 85 U/L (ref 38–126)
Alkaline Phosphatase: 76 U/L (ref 38–126)
Anion gap: 26 — ABNORMAL HIGH (ref 5–15)
Anion gap: 17 — ABNORMAL HIGH (ref 5–15)
BUN: 79 mg/dL — ABNORMAL HIGH (ref 8–23)
BUN: 87 mg/dL — ABNORMAL HIGH (ref 8–23)
CO2: 9 mmol/L — ABNORMAL LOW (ref 22–32)
CO2: 21 mmol/L — ABNORMAL LOW (ref 22–32)
Calcium: 8.6 mg/dL — ABNORMAL LOW (ref 8.9–10.3)
Calcium: 8.3 mg/dL — ABNORMAL LOW (ref 8.9–10.3)
Chloride: 97 mmol/L — ABNORMAL LOW (ref 98–111)
Chloride: 92 mmol/L — ABNORMAL LOW (ref 98–111)
Creatinine, Ser: 3.21 mg/dL — ABNORMAL HIGH (ref 0.61–1.24)
Creatinine, Ser: 2.95 mg/dL — ABNORMAL HIGH (ref 0.61–1.24)
GFR, Estimated: 19 mL/min — ABNORMAL LOW (ref >60)
GFR, Estimated: 21 mL/min — ABNORMAL LOW (ref >60)
Glucose, Bld: 96 mg/dL (ref 70–99)
Glucose, Bld: 186 mg/dL — ABNORMAL HIGH (ref 70–99)
Potassium: 6.6 mmol/L (ref 3.5–5.1)
Potassium: 3.8 mmol/L (ref 3.5–5.1)
Sodium: 132 mmol/L — ABNORMAL LOW (ref 135–145)
Sodium: 130 mmol/L — ABNORMAL LOW (ref 135–145)
Total Bilirubin: 2.2 mg/dL — ABNORMAL HIGH (ref 0.3–1.2)
Total Bilirubin: 1.9 mg/dL — ABNORMAL HIGH (ref 0.3–1.2)
Total Protein: 6.7 g/dL (ref 6.5–8.1)
Total Protein: 6.1 g/dL — ABNORMAL LOW (ref 6.5–8.1)

## 2020-06-15 LAB — GLUCOSE, CAPILLARY
Glucose-Capillary: 95 mg/dL (ref 70–99)
Glucose-Capillary: 153 mg/dL — ABNORMAL HIGH (ref 70–99)
Glucose-Capillary: 208 mg/dL — ABNORMAL HIGH (ref 70–99)
Glucose-Capillary: 172 mg/dL — ABNORMAL HIGH (ref 70–99)
Glucose-Capillary: 122 mg/dL — ABNORMAL HIGH (ref 70–99)
Glucose-Capillary: 129 mg/dL — ABNORMAL HIGH (ref 70–99)

## 2020-06-15 LAB — TROPONIN I (HIGH SENSITIVITY): Troponin I (High Sensitivity): 80 ng/L — ABNORMAL HIGH (ref <18)

## 2020-06-15 LAB — COOXEMETRY PANEL
Carboxyhemoglobin: 1 % (ref 0.5–1.5)
Methemoglobin: 1.1 % (ref 0.0–1.5)
O2 Saturation: 43.5 %
Total hemoglobin: 10.4 g/dL — ABNORMAL LOW (ref 12.0–16.0)

## 2020-06-15 LAB — URINALYSIS, ROUTINE W REFLEX MICROSCOPIC
Bacteria, UA: NONE SEEN
Bilirubin Urine: NEGATIVE
Glucose, UA: 50 mg/dL — AB
Ketones, ur: NEGATIVE mg/dL
Nitrite: NEGATIVE
Protein, ur: 100 mg/dL — AB
RBC / HPF: >50 RBC/hpf — ABNORMAL HIGH (ref 0–5)
Specific Gravity, Urine: 1.017 (ref 1.005–1.030)
pH: 5 (ref 5.0–8.0)

## 2020-06-15 LAB — CBC
HCT: 40.5 % (ref 39.0–52.0)
Hemoglobin: 11.7 g/dL — ABNORMAL LOW (ref 13.0–17.0)
MCH: 24 pg — ABNORMAL LOW (ref 26.0–34.0)
MCHC: 28.9 g/dL — ABNORMAL LOW (ref 30.0–36.0)
MCV: 83.2 fL (ref 80.0–100.0)
Platelets: 394 10*3/uL (ref 150–400)
RBC: 4.87 MIL/uL (ref 4.22–5.81)
RDW: 19.7 % — ABNORMAL HIGH (ref 11.5–15.5)
WBC: 12.9 10*3/uL — ABNORMAL HIGH (ref 4.0–10.5)
nRBC: 0.2 % (ref 0.0–0.2)

## 2020-06-15 LAB — BRAIN NATRIURETIC PEPTIDE: B Natriuretic Peptide: 2424.4 pg/mL — ABNORMAL HIGH (ref 0.0–100.0)

## 2020-06-15 LAB — BLOOD GAS, VENOUS
Acid-base deficit: 11.6 mmol/L — ABNORMAL HIGH (ref 0.0–2.0)
Acid-base deficit: 4.8 mmol/L — ABNORMAL HIGH (ref 0.0–2.0)
Bicarbonate: 13.9 mmol/L — ABNORMAL LOW (ref 20.0–28.0)
Bicarbonate: 19 mmol/L — ABNORMAL LOW (ref 20.0–28.0)
Drawn by: 2585
FIO2: 28
O2 Saturation: 50.1 %
O2 Saturation: 98.6 %
Patient temperature: 37
Patient temperature: 37
pCO2, Ven: 30.8 mmHg — ABNORMAL LOW (ref 44.0–60.0)
pCO2, Ven: 30.6 mmHg — ABNORMAL LOW (ref 44.0–60.0)
pH, Ven: 7.275 (ref 7.250–7.430)
pH, Ven: 7.409 (ref 7.250–7.430)
pO2, Ven: 37 mmHg (ref 32.0–45.0)
pO2, Ven: 138 mmHg — ABNORMAL HIGH (ref 32.0–45.0)

## 2020-06-15 LAB — ECHOCARDIOGRAM LIMITED
MV M vel: 4.09 m/s
MV Peak grad: 66.9 mmHg
S' Lateral: 5.8 cm

## 2020-06-15 LAB — LACTIC ACID, PLASMA
Lactic Acid, Venous: 9.9 mmol/L (ref 0.5–1.9)
Lactic Acid, Venous: 4.8 mmol/L (ref 0.5–1.9)

## 2020-06-15 LAB — MRSA NEXT GEN BY PCR, NASAL: MRSA by PCR Next Gen: NOT DETECTED

## 2020-06-15 MED ORDER — NOREPINEPHRINE 4 MG/250ML-% IV SOLN
0.0000 ug/min | INTRAVENOUS | Status: DC
  Administered 2020-06-18: 2 ug/min via INTRAVENOUS
  Filled 2020-06-15: qty 250

## 2020-06-15 MED ORDER — CALCIUM GLUCONATE 10 % IV SOLN
1.0000 g | Freq: Once | INTRAVENOUS | Status: AC
  Administered 2020-06-15: 1 g via INTRAVENOUS
  Filled 2020-06-15: qty 10

## 2020-06-15 MED ORDER — INSULIN ASPART 100 UNIT/ML IV SOLN
5.0000 [IU] | Freq: Once | INTRAVENOUS | Status: AC
  Administered 2020-06-15: 5 [IU] via INTRAVENOUS

## 2020-06-15 MED ORDER — MIDODRINE HCL 5 MG PO TABS
10.0000 mg | ORAL_TABLET | Freq: Three times a day (TID) | ORAL | Status: DC
  Administered 2020-06-15: 10 mg via ORAL
  Filled 2020-06-15: qty 2

## 2020-06-15 MED ORDER — APIXABAN 2.5 MG PO TABS: 2.5000 mg | ORAL_TABLET | Freq: Two times a day (BID) | ORAL | Status: DC

## 2020-06-15 MED ORDER — INSULIN GLARGINE 100 UNIT/ML ~~LOC~~ SOLN
10.0000 [IU] | Freq: Every day | SUBCUTANEOUS | Status: DC
  Administered 2020-06-15 – 2020-07-09 (×25): 10 [IU] via SUBCUTANEOUS
  Filled 2020-06-15 (×26): qty 0.1

## 2020-06-15 MED ORDER — SODIUM ZIRCONIUM CYCLOSILICATE 10 G PO PACK
10.0000 g | PACK | Freq: Two times a day (BID) | ORAL | Status: DC
  Administered 2020-06-15 (×2): 10 g via ORAL
  Filled 2020-06-15 (×3): qty 1

## 2020-06-15 MED ORDER — PERFLUTREN LIPID MICROSPHERE
INTRAVENOUS | Status: AC
  Administered 2020-06-15: 1 mL
  Filled 2020-06-15: qty 10

## 2020-06-15 MED ORDER — DEXTROSE 50 % IV SOLN
1.0000 | Freq: Once | INTRAVENOUS | Status: AC
  Administered 2020-06-15: 50 mL via INTRAVENOUS
  Filled 2020-06-15: qty 50

## 2020-06-15 MED ORDER — MILRINONE LACTATE IN DEXTROSE 20-5 MG/100ML-% IV SOLN
0.3750 ug/kg/min | INTRAVENOUS | Status: DC
  Administered 2020-06-15: 0.25 ug/kg/min via INTRAVENOUS
  Administered 2020-06-16 – 2020-06-21 (×16): 0.375 ug/kg/min via INTRAVENOUS
  Administered 2020-06-22: 0.25 ug/kg/min via INTRAVENOUS
  Administered 2020-06-22: 0.375 ug/kg/min via INTRAVENOUS
  Administered 2020-06-23 (×2): 0.25 ug/kg/min via INTRAVENOUS
  Administered 2020-06-24 (×2): 0.375 ug/kg/min via INTRAVENOUS
  Administered 2020-06-24: 0.25 ug/kg/min via INTRAVENOUS
  Administered 2020-06-25 – 2020-06-30 (×11): 0.375 ug/kg/min via INTRAVENOUS
  Administered 2020-07-01: 0.25 ug/kg/min via INTRAVENOUS
  Administered 2020-07-01 – 2020-07-02 (×2): 0.375 ug/kg/min via INTRAVENOUS
  Administered 2020-07-02: 0.25 ug/kg/min via INTRAVENOUS
  Administered 2020-07-03 – 2020-07-04 (×3): 0.375 ug/kg/min via INTRAVENOUS
  Filled 2020-06-15 (×46): qty 100

## 2020-06-15 MED ORDER — SODIUM BICARBONATE 8.4 % IV SOLN
100.0000 meq | Freq: Once | INTRAVENOUS | Status: AC
  Administered 2020-06-15: 100 meq via INTRAVENOUS
  Filled 2020-06-15: qty 50

## 2020-06-15 MED ORDER — AMIODARONE HCL IN DEXTROSE 360-4.14 MG/200ML-% IV SOLN
60.0000 mg/h | INTRAVENOUS | Status: AC
  Administered 2020-06-15 (×2): 60 mg/h via INTRAVENOUS
  Filled 2020-06-15: qty 200

## 2020-06-15 MED ORDER — NOREPINEPHRINE 4 MG/250ML-% IV SOLN
0.0000 ug/min | INTRAVENOUS | Status: DC
  Administered 2020-06-15: 2 ug/min via INTRAVENOUS
  Filled 2020-06-15: qty 250

## 2020-06-15 MED ORDER — METOLAZONE 5 MG PO TABS: 10.0000 mg | ORAL_TABLET | Freq: Once | ORAL | Status: DC

## 2020-06-15 MED ORDER — SODIUM CHLORIDE 0.9 % IV SOLN: INTRAVENOUS | Status: DC

## 2020-06-15 MED ORDER — PERFLUTREN LIPID MICROSPHERE
1.0000 mL | INTRAVENOUS | Status: AC | PRN
  Administered 2020-06-15: 3 mL via INTRAVENOUS
  Filled 2020-06-15: qty 10

## 2020-06-15 MED ORDER — METOLAZONE 5 MG PO TABS
10.0000 mg | ORAL_TABLET | Freq: Once | ORAL | Status: AC
  Administered 2020-06-15: 10 mg via ORAL
  Filled 2020-06-15: qty 2

## 2020-06-15 MED ORDER — CHLORHEXIDINE GLUCONATE CLOTH 2 % EX PADS
6.0000 | MEDICATED_PAD | Freq: Every day | CUTANEOUS | Status: DC
  Administered 2020-06-15 – 2020-07-07 (×18): 6 via TOPICAL

## 2020-06-15 MED ORDER — AMIODARONE HCL IN DEXTROSE 360-4.14 MG/200ML-% IV SOLN
30.0000 mg/h | INTRAVENOUS | Status: DC
  Administered 2020-06-16 – 2020-06-18 (×6): 30 mg/h via INTRAVENOUS
  Administered 2020-06-18 – 2020-06-28 (×40): 60 mg/h via INTRAVENOUS
  Administered 2020-06-29 (×2): 30 mg/h via INTRAVENOUS
  Administered 2020-06-29: 60 mg/h via INTRAVENOUS
  Administered 2020-06-30 – 2020-07-03 (×6): 30 mg/h via INTRAVENOUS
  Filled 2020-06-15 (×53): qty 200

## 2020-06-15 MED ORDER — DEXTROSE 10 % IV SOLN: Freq: Once | INTRAVENOUS | Status: AC

## 2020-06-15 MED ORDER — HEPARIN (PORCINE) 25000 UT/250ML-% IV SOLN
1100.0000 [IU]/h | INTRAVENOUS | Status: DC
  Administered 2020-06-15: 1200 [IU]/h via INTRAVENOUS
  Filled 2020-06-15 (×2): qty 250

## 2020-06-15 NOTE — Progress Notes (Signed)
Pt arrived to unit at 2300, on arrival this RN was notified of lactic acid of 8.5. Vitals were BP 83/69, temp 96/7 axillary, HR 102, resp 27. Dr Hal Hope notified. See orders. PCCM and RRT at bedside.Pt transferred to [place].

## 2020-06-15 NOTE — Progress Notes (Addendum)
  CXR with well placed central line. No PTX  Feeling ok.   CVP 10  Co-ox 43%  Start milrinone 0.25. Add NE as needed to keep SBP > 100.  D/w patient and family as well ans care R and CCU pharmD.   Additional 35 mins CCT including coordination of care  Switch patient to HF service.   Glori Bickers, MD  7:48 PM

## 2020-06-15 NOTE — Progress Notes (Signed)
  Echocardiogram 2D Echocardiogram has been performed.  Wesley Winters 06/15/2020, 10:53 AM

## 2020-06-15 NOTE — Progress Notes (Signed)
eLink Physician-Brief Progress Note Patient Name: Wesley Winters DOB: Oct 25, 1940 MRN: [id number]   Date of Service  06/15/2020  HPI/Events of Note  K 6.6 (not hemolyzed). Up from 5.5. HCO3 9 (down from 19). Cr 3.21 (up from 2.64). Lactate 9.9 (up from 8.5).  Only a relatively small amount of UOP despite lasix 120mg  IV push earlier and the lasix drip at 10 mg/hr.   eICU Interventions  1g calcium gluconate, 2 amps of bicarb, 5u regular insulin IV + D50 amp.  I immediately notified Dr. Orpah Melter regarding the patient's clinical status. He is going to place an HD line and speak with nephrology about dialyzing the patient.  Note that the patient is DNR (this was confirmed by Dr. Orpah Melter last night).      Intervention Category Major Interventions: Acid-Base disturbance - evaluation and management;Shock - evaluation and management;Electrolyte abnormality - evaluation and management  Charlott Rakes 06/15/2020, 6:03 AM

## 2020-06-15 NOTE — Progress Notes (Signed)
eLink Physician-Brief Progress Note Patient Name: Wesley Winters DOB: 03-14-1940 MRN: [id number]   Date of Service  06/15/2020  HPI/Events of Note  68M with CAD s/p PCI, cardiomyopathy (EF 25%), Afib, T2DM, and recent CHF-related admission during which he was cardioverted. He was admitted overnight for malaise, Afib with RVR and AKI. He developed worsening hypotension on inpatient unit and was transferred to the ICU for vasopressors.  CT Abd/Pelvis notable for small amount of free peritoneal fluid / ascites and mild subcutaneous edema.  On exam, the patient appears moderately toxic and unwell. He is in Afib with RVR to 110s and BP is 92/77 (84) by NIBP.   eICU Interventions  # Cardiac: - Agree with levophed as first-line agent for his cardiogenic shock c/b hypotension and lactate 8.5 (at 2200 hrs). - Lasix bolus + drip. Foley for Strict I/O. Goal -1L by AM. - Repeat lactate now and then at 0500 hrs. - Echo in AM. - Cardiology consult. Consideration for repeat cardioversion. - Hold home coreg given cardiogenic shock. - Continue home Eliquis + amiodarone for Afib.  DVT PPX: Eliquis GI PPX: Not indicated     Intervention Category Evaluation Type: New Patient Evaluation  Charlott Rakes 06/15/2020, 12:40 AM

## 2020-06-15 NOTE — Progress Notes (Addendum)
Dr. Wynelle Cleveland rounded on patient and discontinued Lasix drip and midodrine and also ordered NS at 75 mL/hr. This RN was told BNP elevation related to myocardial stretch and that patient is dry.

## 2020-06-15 NOTE — Progress Notes (Addendum)
NAME:  Wesley Winters, MRN:  [id number], DOB:  04/07/1940, LOS: 1 ADMISSION DATE:  06/14/2020, CONSULTATION DATE: 06/15/2020 REFERRING MD: Jaquita Rector, CHIEF COMPLAINT: Weakness and fatigue and multiple falls  History of Present Illness:  Is an 80 year old male with history of hypertension, acute systolic heart failure 63%, atrial fibrillation.  He presents pulse after multiple falls.  Patient noted that he was having orthopnea, dyspnea on exertion, and a 10 pound weight gain.  He was trying Lasix 20 mg/day and it was not helping.  He has lower extremity swelling that has continued.  Speaking with his wife she had noted that he became progressively more weak over the past few days.  EMS had been called x2 for his falling.  The first time he decided to stay home.  The second time he was brought into the emergency department.  In the ED work-up remarkable for the following: Elevated creatinine of 2.64, bicarb of 19, AST and ALT elevated at 110 and 87 respectively.  No leukocytosis, hemoglobin stable at 10.8, and platelets of 329.  Potassium mildly elevated at 5.5.  Troponin is 65.  Respiratory panel negative for COVID.  EKG with nonspecific intraventricular conduction delay.  No concerning T wave changes.  Pertinent  Medical History  Hypertension Hyperlipidemia OSA CAD Systolic heart failure ejection fraction 25% Atrial fibrillation  Significant Hospital Events: Including procedures, antibiotic start and stop dates in addition to other pertinent events   Patient admitted for.  Became hypotensive noted to have a lactic acid about 8.  After which patient was admitted to the ICU further management  Interim History / Subjective:  Admission to the ICU on 06/16/2018  Objective   Blood pressure (!) 88/66, pulse (!) 107, temperature (!) 96.7 F (35.9 C), temperature source Axillary, resp. rate (!) 29, height 5\' 8"  (1.727 m), weight 94.6 kg, SpO2 95 %.       No intake or output data in the 24 hours ending  06/15/20 0011 Filed Weights   06/14/20 1328 06/14/20 2306  Weight: 93.6 kg 94.6 kg    Examination: General: Patient in no acute distress.  Appears comfortable alert and oriented x2 HENT moist mucous membranes.  Geographic tongue Lungs: Minimal crackles in the lower lobes.  No increased work of breathing Cardiovascular: Regular rate irregular rhythm.  POCUS with dilated atria bilaterally, and diminished ejection fraction appreciated.  Dilated IVC greater than 2.5 cm without inspiratory variability. Abdomen: Soft nontender nondistended Extremities: No notable deformities appreciated Neuro: Able to follow commands diffusely weak but no focal deficits GU: Condom catheter in place.  Labs/imaging that I havepersonally reviewed  (right click and "Reselect all SmartList Selections" daily)     1. Left ventricular ejection fraction, by estimation, is 20 to 25%. The  left ventricle has severely decreased function. The left ventricle  demonstrates global hypokinesis. The left ventricular internal cavity size  was moderately dilated. Left  ventricular diastolic parameters are indeterminate. Elevated left  ventricular end-diastolic pressure.   2. Right ventricular systolic function is moderately reduced. The right  ventricular size is normal. There is mildly elevated pulmonary artery  systolic pressure.   3. Left atrial size was severely dilated.   4. Right atrial size was mildly dilated.   5. The mitral valve is normal in structure. Mild mitral valve  regurgitation. No evidence of mitral stenosis.   6. The aortic valve is normal in structure. Aortic valve regurgitation is  not visualized. No aortic stenosis is present.   7. The inferior  vena cava is normal in size with greater than 50%  respiratory variability, suggesting right atrial pressure of 3 mmHg.   CT head, CT chest, CT abdomen pelvis on 06/14/2020 as noted below:  IMPRESSION: 1. No acute abnormality. 2. Bilateral nonobstructing  renal calculi. 3. Probable borderline reactive adenopathy in the right upper abdomen. 4.  Calcific coronary artery and aortic atherosclerosis. 5. Cardiomegaly. 6. Markedly enlarged prostate gland     IMPRESSION: 1. No acute intracranial abnormality. 2. Mild chronic microvascular ischemic changes.    Resolved Hospital Problem list   Not Applicable  Assessment & Plan:  This is a 79 year old male with history as noted above who presents with  dyspnea on exertion in the context of known heart failure.  Found to be hypotensive on the floor with lactic acidosis   Cardiogenic shock-patient with obvious overload minimal collapsibility measuring 2.5 cm. Previous EF 25%.   -Start Levophed as needed -Initiate Lasix bolus -To be followed by Lasix infusion -Trend lactic acid -Insert Foley -Repeat echo in AM -Low threshold to start inotrope. However in context of known A fib am hesitant to start dobutamine without adequate trial of diuretic.   AKI on CKD-Previous creatinine 1.23 currently 2.64 -Avoid nephrotoxins as able -Renally dose medications.     A fib-Rate controlled-Recent cardioversion on admission on 05/26/2020 -Hold coreg -Continue amiodarone -Continue home eliquis  DM2-Hold long acting insulin- -Correctional insulin only for now  History of CAD-(stent to  LAD and RCA in 1998 in 1999; patent coronary arteries 2005)        Best practice (right click and "Reselect all SmartList Selections" daily)  Diet:  NPO Pain/Anxiety/Delirium protocol (if indicated): No VAP protocol (if indicated): Not indicated DVT prophylaxis: Subcutaneous Heparin GI prophylaxis: N/A Glucose control:  SSI Yes Central venous access:  N/A Arterial line:  N/A Foley:  N/A Mobility:  bed rest  PT consulted: N/A Last date of multidisciplinary goals of care discussion [Spoke with wife Inez Catalina and she notes that patient would wish to be a DNR.  He would not want to be intubated, he would not  want chest compressions or shocks.] Code Status:  DNR Disposition: ICU for now  Labs   CBC: Recent Labs  Lab 06/14/20 1330  WBC 8.0  NEUTROABS 6.4  HGB 10.8*  HCT 36.6*  MCV 81.9  PLT 829    Basic Metabolic Panel: Recent Labs  Lab 06/10/20 0000 06/14/20 1330 06/14/20 1543  NA 135 131*  --   K 4.7 5.2* 5.5*  CL 94* 95*  --   CO2 26 19*  --   GLUCOSE 165* 186*  --   BUN 31* 68*  --   CREATININE 1.23 2.64*  --   CALCIUM 9.2 8.7*  --   MG  --  2.1  --    GFR: Estimated Creatinine Clearance: 24.1 mL/min (A) (by C-G formula based on SCr of 2.64 mg/dL (H)). Recent Labs  Lab 06/14/20 1330 06/14/20 2202  WBC 8.0  --   LATICACIDVEN  --  8.5*    Liver Function Tests: Recent Labs  Lab 06/14/20 1330  AST 110*  ALT 87*  ALKPHOS 80  BILITOT 1.2  PROT 6.7  ALBUMIN 3.0*   No results for input(s): LIPASE, AMYLASE in the last 168 hours. No results for input(s): AMMONIA in the last 168 hours.  ABG    Component Value Date/Time   TCO2 29 12/22/2009 0714     Coagulation Profile: No results for input(s): INR, PROTIME in  the last 168 hours.  Cardiac Enzymes: No results for input(s): CKTOTAL, CKMB, CKMBINDEX, TROPONINI in the last 168 hours.  HbA1C: Hgb A1c MFr Bld  Date/Time Value Ref Range Status  05/26/2020 02:29 PM 7.5 (H) 4.8 - 5.6 % Final    Comment:    (NOTE) Pre diabetes:          5.7%-6.4%  Diabetes:              >6.4%  Glycemic control for   <7.0% adults with diabetes     CBG: Recent Labs  Lab 06/14/20 2153  GLUCAP 130*    Review of Systems:   Pertinent positives per HPI otherwise 12 point review of systems was negative.  Past Medical History:  He,  has a past medical history of Balanitis, Bladder calculus, Coronary artery disease, Depression, Diabetes mellitus without complication ([place]), GERD (gastroesophageal reflux disease), Hypercholesterolemia, Hypertension, Hypomagnesemia (06/03/2020), Phimosis, Prostate cancer ([place]), and Sleep  apnea.   Surgical History:   Past Surgical History:  Procedure Laterality Date   BIOPSY  07/10/2019   Procedure: BIOPSY;  Surgeon: Otis Brace, MD;  Location: [facility];  Service: Gastroenterology;;   CARDIOVERSION N/A 05/28/2020   Procedure: CARDIOVERSION;  Surgeon: [name], MD;  Location: [facility];  Service: Cardiovascular;  Laterality: N/A;   CIRCUMCISION     CYSTOSCOPY     ESOPHAGOGASTRODUODENOSCOPY (EGD) WITH PROPOFOL N/A 07/10/2019   Procedure: ESOPHAGOGASTRODUODENOSCOPY (EGD) WITH PROPOFOL;  Surgeon: Otis Brace, MD;  Location: [facility];  Service: Gastroenterology;  Laterality: N/A;   HERNIA REPAIR     umbilical   PROSTATE BIOPSY     PROSTATE BIOPSY     PROSTATE BIOPSY     three cardiac stents       Social History:   reports that he has never smoked. He has never used smokeless tobacco. He reports that he does not drink alcohol and does not use drugs.   Family History:  His family history includes Arrhythmia in his brother and mother; Cancer in his brother; Heart disease in his brother and mother.   Allergies Allergies  Allergen Reactions   Metformin Hcl Diarrhea   Other Other (See Comments) and Cough    Pollen- Chest congestion, also   Amoxicillin Hives, Nausea Only and Rash   Amoxicillin-Pot Clavulanate Rash   Empagliflozin Nausea Only, Anxiety and Other (See Comments)    Made the patient jittery and nauseous   Januvia [Sitagliptin] Anxiety     Home Medications  Prior to Admission medications   Medication Sig Start Date End Date Taking? Authorizing Provider  acetaminophen (TYLENOL) 500 MG tablet Take 500-1,000 mg by mouth 3 (three) times daily as needed for moderate pain, mild pain or headache.   Yes [provider]  amiodarone (PACERONE) 200 MG tablet Take 2 tablets (400 mg total) by mouth 2 (two) times daily. 06/13/20  Yes Camnitz, Ocie Doyne, MD  apixaban (ELIQUIS) 5 MG TABS tablet Take 1 tablet (5 mg total) by mouth 2  (two) times daily. 04/23/20  Yes Belva Crome, MD  atorvastatin (LIPITOR) 40 MG tablet Take 40 mg by mouth daily.   Yes [provider]  azelastine (ASTELIN) 0.1 % nasal spray Place 2 sprays into both nostrils 2 (two) times daily as needed for allergies. Use in each nostril as directed   Yes [provider]  B-D UF III MINI PEN NEEDLES 31G X 5 MM MISC 1 each by Other route every evening. 10/01/19  Yes [provider]  carvedilol (COREG) 3.125 MG tablet Take 1 tablet (3.125 mg total) by mouth 2 (two) times daily with a meal. 05/12/20  Yes Belva Crome, MD  cetirizine (ZYRTEC) 10 MG tablet Take 10 mg by mouth in the morning. 03/28/20  Yes [provider]  cholecalciferol (VITAMIN D3) 25 MCG (1000 UT) tablet Take 1,000 Units by mouth at bedtime.   Yes [provider]  fluticasone (FLONASE) 50 MCG/ACT nasal spray Place 2 sprays into both nostrils daily as needed for allergies.    Yes [provider]  furosemide (LASIX) 20 MG tablet Take 1 tablet (20 mg total) by mouth daily. 06/09/20  Yes Belva Crome, MD  metFORMIN (GLUCOPHAGE-XR) 500 MG 24 hr tablet Take 1,000 mg by mouth 2 (two) times daily.  11/11/14  Yes [provider]  mupirocin ointment (BACTROBAN) 2 % APPLY TO RIGHT GREAT TOE AND RIGHT 3RD TOE ONCE DAILY Patient taking differently: Apply 1 application topically See admin instructions. Apply to right great toe and right 3rd toe once daily 11/14/19  Yes [place], Stephani Police, DPM  ONETOUCH ULTRA test strip 1 each 3 (three) times daily. 08/15/19  Yes [provider]  PRESCRIPTION MEDICATION CPAP- At bedtime and during any naps   Yes [provider]  TOUJEO MAX SOLOSTAR 300 UNIT/ML Solostar Pen Inject 25 Units into the skin at bedtime.   Yes [provider]  vitamin B-12 (CYANOCOBALAMIN) 1000 MCG tablet Take 1,000 mcg by mouth at bedtime.   Yes [provider]     Critical care time: 55 minutes

## 2020-06-15 NOTE — Progress Notes (Addendum)
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  He had complaints of bilateral weakness in his legs.    The patient was recently admitted from 5/23 through 1/51 for acute systolic heart failure he was discharged home on 40 mg of Lasix twice daily.  The patient was previously on 20 mg of furosemide daily.  During that hospital stay he underwent successful DC cardioversion on 5/25. 6/6: There is a documented phone call note by Dr. Tamala Julian stating to hold Jardiance furosemide and losartan and come back for a bmet in 48 to 72 hours.  He recommended to stop furosemide for 48 hours and then to drop down to 20 mg daily.  6/8: Note by cardiology PA > patient was back in A. fib with heart rate in the low 100s but blood pressure was low and amiodarone was increased from 200 mg daily to 200 mg twice daily. Cont to hold Lasix  6/10: Cardiology (EP) visit> note mentions cardioverting again to get back into normal sinus rhythm - amiodarone was increased to 400 twice a day.  6/11:  AKI noted with a creatinine of 2.64-Baseline creatinine around 1.2-1.3 Acidosis with a bicarb of 19 Sodium 131 Potassium 5.2> 5.5> 6.6 LFTs elevated: AST 110, ALT 87 Noted to be going in and out of rapid A. fib Lactic acid 8.5 Troponin 65 Pulse ox was 98-100% on room air Rapid response called later in the day due to systolic blood pressures of the in the 80s-she was started on levophed, given a Lasix bolus and drip-Foley placed-cardiology consulted  Subjective: He has no complaints at this time.     Assessment & Plan:   Principal  Problem:   ARF (acute renal failure)- prerenal in setting of poor EF and poor perfusion related to A-fib with RVR   CKD 3a Chronic systolic CHF, right and left heart failure/ Dilated Cardiomyopathy - baseline CR ~ 1.2 - on admission, Creatinine 2.64 > 3.21   - he is making urine and has had > 500 cc out this AM - 2D ECHO> EF < 20% global hypokinesis of LV - RV function severely reduced, RA severely dilated, mod MR - stopped Lasix infusion- no signs of acute CHF currently- lung clear, pulse ox 99-100 % when oxygen is taken off (checked after 10 min wait) and no edema in legs - start slow IVF at 75 cc/hr - I have consulted CHF team because he needs an Inotrope and he needs Cardiversion - recheck Bmet- follow I and O closely - Addendum: repeat Bmet shows Cr 2.95  Active Problems: Hyperkalemia - Possibly related to AKI-potassium rose from 5.2-6.6 (not hemolyzed)-improved with 2 A of bicarb, 5 units of insulin (with D50) Lokelma and Lasix - recheck now Addendum - K is 3.8 now  Lactic acidosis, metabolic acidosis - LA 9.9 - down to 4.8 now - likely due to Metformin in the presence of AKI and poor perfusion related to poor EF and A-fib with RVR - dc Metformin altogether as being on Lasix puts him a risk for recurrent AKI - Jardiance has already been on hold as outpt and  will continue to hold    Persistent atrial fibrillation - likely the cause for his weakness in addition to dehydration -Currently on amiodarone 400 mg twice daily which was increased recently by EP cardiology on 6/10 - Coreg on hold due to hypotension - HR in low 100s at rest - Eliquis dose reduced from 5 to 2.5 BID - I have contacted Cards to ask for a cardioversion while in the hospital  Elevated LFTs- shock liver - LFTs noted to be rising - follow   Ref. Range 06/14/2020 13:30 06/14/2020 15:43 06/15/2020 01:18  AST Latest Ref Range: 15 - 41 U/L 110 (H)  342 (H)  ALT Latest Ref Range: 0 - 44 U/L 87 (H)  239 (H)  Total  Protein Latest Ref Range: 6.5 - 8.1 g/dL 6.7  6.7   UA consistent with a UTI - hold off on antibiotics and follow    DM2 -A1c 7.5 on 5/23 - holding Metformin - He takes Tujeo 25 mg QHS- reduce to 10 QHS for tonight - cont low dose SSI - cont SSI    CAD in native artery - h/o stenting in the past (3 stents) - cont Apixaban- can hold Lipitor until LFTs improve  Morbid obesity Body mass index is 31.78 kg/m.     Time spent in minutes: 45 DVT prophylaxis: apixaban (ELIQUIS) tablet 2.5 mg Start: 06/15/20 2200  Code Status: DNR Family Communication:  Level of Care: Level of care: ICU Disposition Plan:  Status is: Inpatient  Remains inpatient appropriate because:IV treatments appropriate due to intensity of illness or inability to take PO  Dispo: The patient is from: Home              Anticipated d/c is to: Home              Patient currently is not medically stable to d/c.   Difficult to place patient No      Consultants:  none Procedures:  none Antimicrobials:  Anti-infectives (From admission, onward)    None        Objective: Vitals:   06/15/20 0800 06/15/20 0900 06/15/20 1000 06/15/20 1100  BP: 119/77 118/74 108/75 105/67  Pulse: (!) 108 (!) 106 (!) 110 (!) 112  Resp: 17 18 16 18   Temp: 98.96 F (37.2 C) 98.78 F (37.1 C) 98.78 F (37.1 C) 98.6 F (37 C)  TempSrc:      SpO2: 100% 100% 100% 100%  Weight:      Height:        Intake/Output Summary (Last 24 hours) at 06/15/2020 1133 Last data filed at 06/15/2020 1000 Gross per 24 hour  Intake 218.1 ml  Output 870 ml  Net -651.9 ml   Filed Weights   06/14/20 1328 06/14/20 2306 06/15/20 0041  Weight: 93.6 kg 94.6 kg 94.8 kg    Examination: General exam: Appears comfortable  HEENT: PERRLA, oral mucosa moist, no sclera icterus or thrush Respiratory system: Clear to auscultation. Respiratory effort normal. Cardiovascular system: S1 & S2 heard, IIRR Gastrointestinal system: Abdomen soft,  non-tender, nondistended. Normal bowel sounds. Central nervous system: Alert and oriented. No focal neurological deficits. Extremities: No cyanosis, clubbing or edema Skin: No rashes or ulcers Psychiatry:  Mood & affect appropriate.     Data Reviewed: I have personally reviewed following labs and imaging studies  CBC: Recent Labs  Lab 06/14/20 1330 06/15/20 0118  WBC 8.0 12.9*  NEUTROABS 6.4  --   HGB 10.8* 11.7*  HCT 36.6* 40.5  MCV  81.9 83.2  PLT 329 810   Basic Metabolic Panel: Recent Labs  Lab 06/10/20 0000 06/14/20 1330 06/14/20 1543 06/15/20 0118  NA 135 131*  --  132*  K 4.7 5.2* 5.5* 6.6*  CL 94* 95*  --  97*  CO2 26 19*  --  9*  GLUCOSE 165* 186*  --  96  BUN 31* 68*  --  79*  CREATININE 1.23 2.64*  --  3.21*  CALCIUM 9.2 8.7*  --  8.6*  MG  --  2.1  --   --    GFR: Estimated Creatinine Clearance: 19.8 mL/min (A) (by C-G formula based on SCr of 3.21 mg/dL (H)). Liver Function Tests: Recent Labs  Lab 06/14/20 1330 06/15/20 0118  AST 110* 342*  ALT 87* 239*  ALKPHOS 80 85  BILITOT 1.2 2.2*  PROT 6.7 6.7  ALBUMIN 3.0* 3.0*   No results for input(s): LIPASE, AMYLASE in the last 168 hours. No results for input(s): AMMONIA in the last 168 hours. Coagulation Profile: No results for input(s): INR, PROTIME in the last 168 hours. Cardiac Enzymes: No results for input(s): CKTOTAL, CKMB, CKMBINDEX, TROPONINI in the last 168 hours. BNP (last 3 results) Recent Labs    07/11/19 1107 04/23/20 1446  PROBNP 445 2,488*   HbA1C: No results for input(s): HGBA1C in the last 72 hours. CBG: Recent Labs  Lab 06/14/20 2153 06/15/20 0100 06/15/20 1129  GLUCAP 130* 95 172*   Lipid Profile: No results for input(s): CHOL, HDL, LDLCALC, TRIG, CHOLHDL, LDLDIRECT in the last 72 hours. Thyroid Function Tests: Recent Labs    06/14/20 2030  TSH 7.588*   Anemia Panel: No results for input(s): VITAMINB12, FOLATE, FERRITIN, TIBC, IRON, RETICCTPCT in the last 72  hours. Urine analysis:    Component Value Date/Time   COLORURINE AMBER (A) 06/15/2020 0222   APPEARANCEUR TURBID (A) 06/15/2020 0222   LABSPEC 1.017 06/15/2020 0222   PHURINE 5.0 06/15/2020 0222   GLUCOSEU 50 (A) 06/15/2020 0222   HGBUR LARGE (A) 06/15/2020 0222   BILIRUBINUR NEGATIVE 06/15/2020 0222   KETONESUR NEGATIVE 06/15/2020 0222   PROTEINUR 100 (A) 06/15/2020 0222   NITRITE NEGATIVE 06/15/2020 0222   LEUKOCYTESUR SMALL (A) 06/15/2020 0222   Sepsis Labs: @LABRCNTIP (procalcitonin:4,lacticidven:4) ) Recent Results (from the past 240 hour(s))  Resp Panel by RT-PCR (Flu A&B, Covid) Nasopharyngeal Swab     Status: None   Collection Time: 06/14/20  3:31 PM   Specimen: Nasopharyngeal Swab; Nasopharyngeal(NP) swabs in vial transport medium  Result Value Ref Range Status   SARS Coronavirus 2 by RT PCR NEGATIVE NEGATIVE Final    Comment: (NOTE) SARS-CoV-2 target nucleic acids are NOT DETECTED.  The SARS-CoV-2 RNA is generally detectable in upper respiratory specimens during the acute phase of infection. The lowest concentration of SARS-CoV-2 viral copies this assay can detect is 138 copies/mL. A negative result does not preclude SARS-Cov-2 infection and should not be used as the sole basis for treatment or other patient management decisions. A negative result may occur with  improper specimen collection/handling, submission of specimen other than nasopharyngeal swab, presence of viral mutation(s) within the areas targeted by this assay, and inadequate number of viral copies(<138 copies/mL). A negative result must be combined with clinical observations, patient history, and epidemiological information. The expected result is Negative.  Fact Sheet for Patients:  EntrepreneurPulse.com.au  Fact Sheet for Healthcare Providers:  IncredibleEmployment.be  This test is no t yet approved or cleared by the [place] FDA and  has been  authorized  for detection and/or diagnosis of SARS-CoV-2 by FDA under an Emergency Use Authorization (EUA). This EUA will remain  in effect (meaning this test can be used) for the duration of the COVID-19 declaration under Section 564(b)(1) of the Act, 21 U.S.C.section 360bbb-3(b)(1), unless the authorization is terminated  or revoked sooner.       Influenza A by PCR NEGATIVE NEGATIVE Final   Influenza B by PCR NEGATIVE NEGATIVE Final    Comment: (NOTE) The Xpert Xpress SARS-CoV-2/FLU/RSV plus assay is intended as an aid in the diagnosis of influenza from Nasopharyngeal swab specimens and should not be used as a sole basis for treatment. Nasal washings and aspirates are unacceptable for Xpert Xpress SARS-CoV-2/FLU/RSV testing.  Fact Sheet for Patients: EntrepreneurPulse.com.au  Fact Sheet for Healthcare Providers: IncredibleEmployment.be  This test is not yet approved or cleared by the [place] FDA and has been authorized for detection and/or diagnosis of SARS-CoV-2 by FDA under an Emergency Use Authorization (EUA). This EUA will remain in effect (meaning this test can be used) for the duration of the COVID-19 declaration under Section 564(b)(1) of the Act, 21 U.S.C. section 360bbb-3(b)(1), unless the authorization is terminated or revoked.  Performed at [facility], [place] [address] [place], [place] [address]   MRSA Next Gen by PCR, Nasal     Status: None   Collection Time: 06/15/20 12:30 AM  Result Value Ref Range Status   MRSA by PCR Next Gen NOT DETECTED NOT DETECTED Final    Comment: (NOTE) The GeneXpert MRSA Assay (FDA approved for NASAL specimens only), is one component of a comprehensive MRSA colonization surveillance program. It is not intended to diagnose MRSA infection nor to guide or monitor treatment for MRSA infections. Test performance is not FDA approved in patients less than 7 years old. Performed at [place], [place] [address] [place], [place] [address]          Radiology Studies: CT ABDOMEN PELVIS WO CONTRAST  Result Date: 06/14/2020 CLINICAL DATA:  Abdominal pain and distension. Recent hospitalization for atrial fibrillation. Fell last night and today. Bilateral leg weakness. EXAM: CT CHEST, ABDOMEN AND PELVIS WITHOUT CONTRAST TECHNIQUE: Multidetector CT imaging of the chest, abdomen and pelvis was performed following the standard protocol without IV contrast. COMPARISON:  Pelvis CT dated 10/15/2014. Chest radiographs obtained earlier today. FINDINGS: CT CHEST FINDINGS Cardiovascular: Enlarged heart. Atheromatous calcifications, including the coronary arteries and aorta. Mediastinum/Nodes: No enlarged mediastinal, hilar, or axillary lymph nodes. Thyroid gland, trachea, and esophagus demonstrate no significant findings. Lungs/Pleura: Small left upper lobe calcified granuloma. Normal vascularity. No airspace consolidation or pleural fluid. Musculoskeletal: Thoracic and lower cervical spine degenerative changes. No fractures. CT ABDOMEN PELVIS FINDINGS Hepatobiliary: Mildly heterogeneous, medium density the in the gallbladder. No gallbladder wall thickening or pericholecystic fluid seen. Unremarkable liver. Pancreas: Moderate pancreatic atrophy. Spleen: Normal in size and shape. Small probable cyst or hemangioma. Small calcified granuloma. Adrenals/Urinary Tract: Bilateral renal cysts. The largest is in the lower pole of the right kidney and measures 7 cm in maximum diameter with a small amount of thin wall calcification. Adjacent calculi in the upper pole of the right kidney, the largest measuring 1.4 cm. 1.0 cm left renal pelvis calculus. No hydronephrosis. Normal appearing ureters. Poorly distended urinary bladder. Stomach/Bowel: Unremarkable stomach, small bowel and colon. No evidence of appendicitis. Vascular/Lymphatic: Mildly prominent porta hepatis and portacaval lymph nodes with no pathologically  enlarged nodes. Atheromatous arterial calcifications. Reproductive: Markedly enlarged prostate gland containing several  small surgical clips or radioactive seeds. Other: Small amount of free peritoneal fluid. Mild subcutaneous edema. Musculoskeletal: Lumbar spine degenerative changes. IMPRESSION: 1. No acute abnormality. 2. Bilateral nonobstructing renal calculi. 3. Probable borderline reactive adenopathy in the right upper abdomen. 4.  Calcific coronary artery and aortic atherosclerosis. 5. Cardiomegaly. 6. Markedly enlarged prostate gland. Electronically Signed   By: Claudie Revering M.D.   On: 06/14/2020 17:50   DG Chest 2 View  Result Date: 06/14/2020 CLINICAL DATA:  Shortness of breath and weakness.  Falling. EXAM: CHEST - 2 VIEW COMPARISON:  05/27/2020 FINDINGS: Lungs are adequately inflated with resolution of previously seen small left effusion. No acute airspace process. Mild stable cardiomegaly. Remainder of the exam is unchanged. IMPRESSION: 1. No acute cardiopulmonary disease. 2. Mild stable cardiomegaly. Electronically Signed   By: Marin Olp M.D.   On: 06/14/2020 14:01   CT Head Wo Contrast  Result Date: 06/14/2020 CLINICAL DATA:  Fall. EXAM: CT HEAD WITHOUT CONTRAST TECHNIQUE: Contiguous axial images were obtained from the base of the skull through the vertex without intravenous contrast. COMPARISON:  None. FINDINGS: Brain: No evidence of acute infarction, hemorrhage, hydrocephalus, extra-axial collection or mass lesion/mass effect. Mild generalized cerebral atrophy, within normal limits for age. Scattered mild periventricular and subcortical white matter hypodensities are nonspecific, but favored to reflect chronic microvascular ischemic changes. Vascular: Calcified atherosclerosis at the skullbase. No hyperdense vessel. Skull: Normal. Negative for fracture or focal lesion. Sinuses/Orbits: No acute finding. Other: None. IMPRESSION: 1. No acute intracranial abnormality. 2. Mild chronic  microvascular ischemic changes. Electronically Signed   By: Titus Dubin M.D.   On: 06/14/2020 17:35   CT Chest Wo Contrast  Result Date: 06/14/2020 CLINICAL DATA:  Abdominal pain and distension. Recent hospitalization for atrial fibrillation. Fell last night and today. Bilateral leg weakness. EXAM: CT CHEST, ABDOMEN AND PELVIS WITHOUT CONTRAST TECHNIQUE: Multidetector CT imaging of the chest, abdomen and pelvis was performed following the standard protocol without IV contrast. COMPARISON:  Pelvis CT dated 10/15/2014. Chest radiographs obtained earlier today. FINDINGS: CT CHEST FINDINGS Cardiovascular: Enlarged heart. Atheromatous calcifications, including the coronary arteries and aorta. Mediastinum/Nodes: No enlarged mediastinal, hilar, or axillary lymph nodes. Thyroid gland, trachea, and esophagus demonstrate no significant findings. Lungs/Pleura: Small left upper lobe calcified granuloma. Normal vascularity. No airspace consolidation or pleural fluid. Musculoskeletal: Thoracic and lower cervical spine degenerative changes. No fractures. CT ABDOMEN PELVIS FINDINGS Hepatobiliary: Mildly heterogeneous, medium density the in the gallbladder. No gallbladder wall thickening or pericholecystic fluid seen. Unremarkable liver. Pancreas: Moderate pancreatic atrophy. Spleen: Normal in size and shape. Small probable cyst or hemangioma. Small calcified granuloma. Adrenals/Urinary Tract: Bilateral renal cysts. The largest is in the lower pole of the right kidney and measures 7 cm in maximum diameter with a small amount of thin wall calcification. Adjacent calculi in the upper pole of the right kidney, the largest measuring 1.4 cm. 1.0 cm left renal pelvis calculus. No hydronephrosis. Normal appearing ureters. Poorly distended urinary bladder. Stomach/Bowel: Unremarkable stomach, small bowel and colon. No evidence of appendicitis. Vascular/Lymphatic: Mildly prominent porta hepatis and portacaval lymph nodes with no  pathologically enlarged nodes. Atheromatous arterial calcifications. Reproductive: Markedly enlarged prostate gland containing several small surgical clips or radioactive seeds. Other: Small amount of free peritoneal fluid. Mild subcutaneous edema. Musculoskeletal: Lumbar spine degenerative changes. IMPRESSION: 1. No acute abnormality. 2. Bilateral nonobstructing renal calculi. 3. Probable borderline reactive adenopathy in the right upper abdomen. 4.  Calcific coronary artery and aortic atherosclerosis. 5. Cardiomegaly. 6. Markedly enlarged prostate gland.  Electronically Signed   By: Claudie Revering M.D.   On: 06/14/2020 17:50   ECHOCARDIOGRAM LIMITED  Result Date: 06/15/2020    ECHOCARDIOGRAM LIMITED REPORT   Patient Name:   TAVONE CAESAR Date of Exam: 06/15/2020 Medical Rec #:  [id number]    Height:       68.0 in Accession #:    [id number]   Weight:       209.0 lb Date of Birth:  [birth year]    BSA:          2.082 m Patient Age:    5 years     BP:           119/71 mmHg Patient Gender: M            HR:           108 bpm. Exam Location:  Inpatient Procedure: Limited Echo, Cardiac Doppler, Color Doppler and Intracardiac            Opacification Agent Indications:    Shock  History:        Patient has prior history of Echocardiogram examinations, most                 recent 05/05/2020. CAD, Arrythmias:Atrial Fibrillation; Risk                 Factors:Hypertension, Dyslipidemia, Sleep Apnea and Diabetes.                 S/P PCI.  Sonographer:    Clayton Lefort RDCS (AE) Referring Phys: [id number] [place]  1. Left ventricular ejection fraction, by estimation, is <20%. The left ventricle has severely decreased function. The left ventricle demonstrates global hypokinesis. The left ventricular internal cavity size was severely dilated. Left ventricular diastolic function could not be evaluated.  2. Right ventricular systolic function is severely reduced. The right ventricular size is moderately enlarged. There  is moderately elevated pulmonary artery systolic pressure. The estimated right ventricular systolic pressure is 02.5 mmHg.  3. Right atrial size was severely dilated.  4. The mitral valve is degenerative. Moderate mitral valve regurgitation. No evidence of mitral stenosis.  5. The aortic valve is tricuspid. Aortic valve regurgitation is not visualized. Mild aortic valve sclerosis is present, with no evidence of aortic valve stenosis.  6. The inferior vena cava is dilated in size with <50% respiratory variability, suggesting right atrial pressure of 15 mmHg. FINDINGS  Left Ventricle: Left ventricular ejection fraction, by estimation, is <20%. The left ventricle has severely decreased function. The left ventricle demonstrates global hypokinesis. Definity contrast agent was given IV to delineate the left ventricular endocardial borders. The left ventricular internal cavity size was severely dilated. There is no left ventricular hypertrophy. Left ventricular diastolic function could not be evaluated. Left ventricular diastolic function could not be evaluated due to atrial fibrillation. Right Ventricle: The right ventricular size is moderately enlarged. No increase in right ventricular wall thickness. Right ventricular systolic function is severely reduced. There is moderately elevated pulmonary artery systolic pressure. The tricuspid regurgitant velocity is 3.31 m/s, and with an assumed right atrial pressure of 15 mmHg, the estimated right ventricular systolic pressure is 42.7 mmHg. Left Atrium: Left atrial size was normal in size. Right Atrium: Right atrial size was severely dilated. Pericardium: There is no evidence of pericardial effusion. Mitral Valve: The mitral valve is degenerative in appearance. There is mild thickening of the mitral valve leaflet(s). There is mild calcification of the mitral valve leaflet(s). Moderate  mitral valve regurgitation. No evidence of mitral valve stenosis. Tricuspid Valve: The  tricuspid valve is normal in structure. Tricuspid valve regurgitation is mild . No evidence of tricuspid stenosis. Aortic Valve: The aortic valve is tricuspid. Aortic valve regurgitation is not visualized. Mild aortic valve sclerosis is present, with no evidence of aortic valve stenosis. Pulmonic Valve: The pulmonic valve was normal in structure. Pulmonic valve regurgitation is not visualized. No evidence of pulmonic stenosis. Aorta: The aortic root is normal in size and structure. Venous: The inferior vena cava is dilated in size with less than 50% respiratory variability, suggesting right atrial pressure of 15 mmHg. IAS/Shunts: No atrial level shunt detected by color flow Doppler. LEFT VENTRICLE PLAX 2D LVIDd:         6.40 cm LVIDs:         5.80 cm LV PW:         0.90 cm LV IVS:        0.80 cm LVOT diam:     2.10 cm LVOT Area:     3.46 cm  IVC IVC diam: 2.70 cm LEFT ATRIUM         Index LA diam:    3.50 cm 1.68 cm/m   AORTA Ao Root diam: 3.40 cm Ao Asc diam:  3.40 cm MR Peak grad: 66.9 mmHg   TRICUSPID VALVE MR Mean grad: 45.0 mmHg   TR Peak grad:   43.8 mmHg MR Vmax:      409.00 cm/s TR Vmax:        331.00 cm/s MR Vmean:     317.0 cm/s                           SHUNTS                           Systemic Diam: 2.10 cm Fransico Him MD Electronically signed by Fransico Him MD Signature Date/Time: 06/15/2020/11:02:38 AM    Final       Scheduled Meds:  amiodarone  400 mg Oral BID   apixaban  5 mg Oral BID   Chlorhexidine Gluconate Cloth  6 each Topical Q0600   insulin aspart  0-9 Units Subcutaneous TID WC   midodrine  10 mg Oral TID WC   sodium zirconium cyclosilicate  10 g Oral BID   vitamin B-12  1,000 mcg Oral QHS   Continuous Infusions:  sodium chloride 10 mL/hr at 06/15/20 0600   furosemide (LASIX) 200 mg in dextrose 5% 100 mL (2mg /mL) infusion 15 mg/hr (06/15/20 0830)     LOS: 1 day      Debbe Odea, MD [facility] Pager: [email or web address] 06/15/2020, 11:33 AM

## 2020-06-15 NOTE — Progress Notes (Signed)
ANTICOAGULATION CONSULT NOTE - Initial Consult  Pharmacy Consult for heparin Indication: atrial fibrillation  Allergies  Allergen Reactions   Metformin Hcl Diarrhea   Other Other (See Comments) and Cough    Pollen- Chest congestion, also   Amoxicillin Hives, Nausea Only and Rash   Amoxicillin-Pot Clavulanate Rash   Empagliflozin Nausea Only, Anxiety and Other (See Comments)    Made the patient jittery and nauseous   Januvia [Sitagliptin] Anxiety    Patient Measurements: Height: 5\' 8"  (172.7 cm) Weight: 94.8 kg (208 lb 15.9 oz) IBW/kg (Calculated) : 68.4 Heparin Dosing Weight: 88 kg  Vital Signs: Temp: 98.6 F (37 C) (06/12 1700) BP: 102/87 (06/12 1700) Pulse Rate: 108 (06/12 1700)  Labs: Recent Labs    06/14/20 1330 06/14/20 2202 06/15/20 0118 06/15/20 1300  HGB 10.8*  --  11.7*  --   HCT 36.6*  --  40.5  --   PLT 329  --  394  --   CREATININE 2.64*  --  3.21* 2.95*  TROPONINIHS  --  65* 80*  --     Estimated Creatinine Clearance: 21.6 mL/min (A) (by C-G formula based on SCr of 2.95 mg/dL (H)).   Medical History: Past Medical History:  Diagnosis Date   Balanitis    Bladder calculus    Coronary artery disease    stents x3 by Dr Tamala Julian 1998   Depression    Diabetes mellitus without complication ([place])    GERD (gastroesophageal reflux disease)    Hypercholesterolemia    Hypertension    Hypomagnesemia 06/03/2020   Phimosis    Prostate cancer ([place])    Sleep apnea    wears CPAP nightly     Assessment: 80 yo M on apixaban PTA for afib now holding for cardiogenic shock. Last dose of apixaban 6/12 at 10am. Pharmacy consulted for heparin.  Patient with AKI on CKD, CLCr ~20 ml/min. H/H, plt stable. MD planning central line placement soon.    Goal of Therapy:  Heparin level 0.3-0.7 units/ml aPTT 66-102 seconds Monitor platelets by anticoagulation protocol: Yes   Plan:  Stop apixaban Start heparin 1200 units/hr at 22:00 tonight or after central line is  placed  F/u aPTT until correlates with heparin level  Monitor daily aPTT, HL, CBC/plt Monitor for signs/symptoms of bleeding  F/u restart apixaban after procedures   Benetta Spar, PharmD, BCPS, BCCP Clinical Pharmacist  Please check AMION for all [facility] phone numbers After 10:00 PM, call [place]

## 2020-06-15 NOTE — Progress Notes (Signed)
Central Venous Catheter Insertion Procedure Note Wesley Winters [id number] [birth year]    Procedure: Insertion of Central Venous Catheter Indications: Drug and/or fluid administration   Procedure Details Consent: Risks of procedure as well as the alternatives and risks of each were explained to the (patient/caregiver).  Consent for procedure obtained. Time Out: Verified patient identification, verified procedure, site/side was marked, verified correct patient position, special equipment/implants available, medications/allergies/relevent history reviewed, required imaging and test results available.  Performed   Maximum sterile technique was used including antiseptics, cap, gloves, gown, hand hygiene, mask and sheet. Skin prep: Chlorhexidine; local anesthetic administered A antimicrobial bonded/coated triple lumen catheter was placed in the right internal jugular vein using the Seldinger technique and u/s guidance.   Evaluation Blood flow good Complications: No apparent complications Patient did tolerate procedure well. Chest X-ray ordered to verify placement.  CXR:  ok    Glori Bickers, MD  6:21 PM

## 2020-06-15 NOTE — Consult Note (Addendum)
Advanced Heart Failure Team Consult Note   Primary Physician: Primary Cardiologist:    Reason for Consult: Cardiogenic shock  Referring: Dr. Fransico Him   HPI:    Wesley Winters is a 80 y.o. male with a hx of DM2, hypertension, hyperlipidemia, OSA, CAD (stent to LAD and RCA in 1998 and 1999; patent coronary arteries 3329), acute systolic HF 51%, and AF unknown duration first diagnosed 04/23/2020. Whom we are asked to see due to cardiogenic shock.   Found to have new onset AF of unknown duration at office visit 04/23/20 with Dr. Tamala Julian. Started on amio and  anticoagulation with plans for eventual DC-CV.   Echo 5/2/22th LVEF 20-25%  and moderately reduced RV function (previous EF 50%)  Admitted 5/23 with acute HF. Diuresed. Underwent successful DC-CV on 5/25. Discharged home 06/03/20  Seen by PCP 6/7. Patient with low BP and back in AF in 100-110 range. Lasix cut back and amio increased to 200 bid after discussion with Cardiology.   See in [facility] by Dr. Curt Bears on 06/13/20. BP 110/74 at that visit. HR 113 in AF. Felt not to be in HF.  Had 2 falls at home and told to come to ER. In ER 6/11 c/o weakness poor appetite and nausea. Found to be in AF with RVR Creatinine 1.5 -> 3.2 with K 6.6. Bicarb 9 LFTs up hs trop 80.  Seen by [facility]. Felt to be dehydrated and given IVF.   SBP dropped to 80s. Lactate checked and was 8.5 CCM contacted. AST  342-> 3005  ALT 239 -> 1397 CT ab/pelvis not acute. Started on NE 2 and IV lasix gtt for about 3 hours with excellent response. Peed 1500cc. Lactate 8.5 -> 9.9 -> 4.0 SCr 3.2 -> 2.9 K 6.6 -> 3.8   Now off NE. Feels ok No CP or SOB.   Review of Systems: [y] = yes, [ ]  = no   General: Weight gain [ ] ; Weight loss [ ] ; Anorexia [ y]; Fatigue [ y]; Fever [ ] ; Chills [ ] ; Weakness Blue.Reese ]  Cardiac: Chest pain/pressure [ y]; Resting SOB [ y]; Exertional SOB [ y]; Orthopnea [ ] ; Pedal Edema [ ] ; Palpitations [ ] ; Syncope [ ] ; Presyncope [ ] ; Paroxysmal nocturnal  dyspnea[ y]  Pulmonary: Cough [ y]; Wheezing[ ] ; Hemoptysis[ ] ; Sputum [ ] ; Snoring [ ]   GI: Vomiting[ ] ; Dysphagia[ ] ; Melena[ ] ; Hematochezia [ ] ; Heartburn[ ] ; Abdominal pain [ ] ; Constipation [ ] ; Diarrhea [ ] ; BRBPR [ ]   GU: Hematuria[ ] ; Dysuria [ ] ; Nocturia[ ]   Vascular: Pain in legs with walking [ ] ; Pain in feet with lying flat [ ] ; Non-healing sores [ ] ; Stroke [ ] ; TIA [ ] ; Slurred speech [ ] ;  Neuro: Headaches[ ] ; Vertigo[ ] ; Seizures[ ] ; Paresthesias[ ] ;Blurred vision [ ] ; Diplopia [ ] ; Vision changes [ ]   Ortho/Skin: Arthritis [ y]; Joint pain Blue.Reese ]; Muscle pain [ ] ; Joint swelling [ ] ; Back Pain [ ] ; Rash [ ]   Psych: Depression[ ] ; Anxiety[ ]   Heme: Bleeding problems [ ] ; Clotting disorders [ ] ; Anemia [ ]   Endocrine: Diabetes [ y]; Thyroid dysfunction[ ]   Home Medications Prior to Admission medications   Medication Sig Start Date End Date Taking? Authorizing Provider  acetaminophen (TYLENOL) 500 MG tablet Take 500-1,000 mg by mouth 3 (three) times daily as needed for moderate pain, mild pain or headache.   Yes [provider]  amiodarone (PACERONE) 200 MG tablet Take 2  tablets (400 mg total) by mouth 2 (two) times daily. 06/13/20  Yes Camnitz, Ocie Doyne, MD  apixaban (ELIQUIS) 5 MG TABS tablet Take 1 tablet (5 mg total) by mouth 2 (two) times daily. 04/23/20  Yes Belva Crome, MD  atorvastatin (LIPITOR) 40 MG tablet Take 40 mg by mouth daily.   Yes [provider]  azelastine (ASTELIN) 0.1 % nasal spray Place 2 sprays into both nostrils 2 (two) times daily as needed for allergies. Use in each nostril as directed   Yes [provider]  B-D UF III MINI PEN NEEDLES 31G X 5 MM MISC 1 each by Other route every evening. 10/01/19  Yes [provider]  carvedilol (COREG) 3.125 MG tablet Take 1 tablet (3.125 mg total) by mouth 2 (two) times daily with a meal. 05/12/20  Yes Belva Crome, MD  cetirizine (ZYRTEC) 10 MG tablet Take 10 mg by mouth in the  morning. 03/28/20  Yes [provider]  cholecalciferol (VITAMIN D3) 25 MCG (1000 UT) tablet Take 1,000 Units by mouth at bedtime.   Yes [provider]  fluticasone (FLONASE) 50 MCG/ACT nasal spray Place 2 sprays into both nostrils daily as needed for allergies.    Yes [provider]  furosemide (LASIX) 20 MG tablet Take 1 tablet (20 mg total) by mouth daily. 06/09/20  Yes Belva Crome, MD  metFORMIN (GLUCOPHAGE-XR) 500 MG 24 hr tablet Take 1,000 mg by mouth 2 (two) times daily.  11/11/14  Yes [provider]  mupirocin ointment (BACTROBAN) 2 % APPLY TO RIGHT GREAT TOE AND RIGHT 3RD TOE ONCE DAILY Patient taking differently: Apply 1 application topically See admin instructions. Apply to right great toe and right 3rd toe once daily 11/14/19  Yes G[place]way, Stephani Police, DPM  ONETOUCH ULTRA test strip 1 each 3 (three) times daily. 08/15/19  Yes [provider]  PRESCRIPTION MEDICATION CPAP- At bedtime and during any naps   Yes [provider]  TOUJEO MAX SOLOSTAR 300 UNIT/ML Solostar Pen Inject 25 Units into the skin at bedtime.   Yes [provider]  vitamin B-12 (CYANOCOBALAMIN) 1000 MCG tablet Take 1,000 mcg by mouth at bedtime.   Yes [provider]    Past Medical History: Past Medical History:  Diagnosis Date   Balanitis    Bladder calculus    Coronary artery disease    stents x3 by Dr Tamala Julian 1998   Depression    Diabetes mellitus without complication ([place])    GERD (gastroesophageal reflux disease)    Hypercholesterolemia    Hypertension    Hypomagnesemia 06/03/2020   Phimosis    Prostate cancer (HCC)    Sleep apnea    wears CPAP nightly    Past Surgical History: Past Surgical History:  Procedure Laterality Date   BIOPSY  07/10/2019   Procedure: BIOPSY;  Surgeon: Otis Brace, MD;  Location: [facility];  Service: Gastroenterology;;   CARDIOVERSION N/A 05/28/2020   Procedure: CARDIOVERSION;  Surgeon:  [name], MD;  Location: [facility] ENDOSCOPY;  Service: Cardiovascular;  Laterality: N/A;   CIRCUMCISION     CYSTOSCOPY     ESOPHAGOGASTRODUODENOSCOPY (EGD) WITH PROPOFOL N/A 07/10/2019   Procedure: ESOPHAGOGASTRODUODENOSCOPY (EGD) WITH PROPOFOL;  Surgeon: Otis Brace, MD;  Location: [facility] ENDOSCOPY;  Service: Gastroenterology;  Laterality: N/A;   HERNIA REPAIR     umbilical   PROSTATE BIOPSY     PROSTATE BIOPSY     PROSTATE BIOPSY     three cardiac stents  Family History: Family History  Problem Relation Age of Onset   Cancer Brother        prostate   Heart disease Brother    Heart disease Mother    Arrhythmia Mother    Arrhythmia Brother     Social History: Social History   Socioeconomic History   Marital status: Married    Spouse name: Not on file   Number of children: Not on file   Years of education: Not on file   Highest education level: Not on file  Occupational History   Not on file  Tobacco Use   Smoking status: Never   Smokeless tobacco: Never  Substance and Sexual Activity   Alcohol use: No   Drug use: No   Sexual activity: Yes  Other Topics Concern   Not on file  Social History Narrative   Not on file   Social Determinants of Health   Financial Resource Strain: Low Risk    Difficulty of Paying Living Expenses: Not very hard  Food Insecurity: No Food Insecurity   Worried About Running Out of Food in the Last Year: Never true   Ran Out of Food in the Last Year: Never true  Transportation Needs: No Transportation Needs   Lack of Transportation (Medical): No   Lack of Transportation (Non-Medical): No  Physical Activity: Not on file  Stress: Not on file  Social Connections: Not on file    Allergies:  Allergies  Allergen Reactions   Metformin Hcl Diarrhea   Other Other (See Comments) and Cough    Pollen- Chest congestion, also   Amoxicillin Hives, Nausea Only and Rash   Amoxicillin-Pot Clavulanate Rash   Empagliflozin Nausea Only,  Anxiety and Other (See Comments)    Made the patient jittery and nauseous   Januvia [Sitagliptin] Anxiety    Objective:    Vital Signs:   Temp:  [93.4 F (34.1 C)-99 F (37.2 C)] 98.42 F (36.9 C) (06/12 1600) Pulse Rate:  [33-132] 110 (06/12 1400) Resp:  [15-33] 17 (06/12 1600) BP: (82-141)/(46-102) 111/79 (06/12 1600) SpO2:  [90 %-100 %] 100 % (06/12 1400) Weight:  [94.6 kg-94.8 kg] 94.8 kg (06/12 0041) Last BM Date:  (pta 6/10) Filed Weights   06/14/20 1328 06/14/20 2306 06/15/20 0041  Weight: 93.6 kg 94.6 kg 94.8 kg    Physical Exam: General:  Weak appearing. No resp difficulty HEENT: normal Neck: supple. JVP 8 Carotids 2+ bilat; no bruits. No lymphadenopathy or thryomegaly appreciated. Cor: PMI nondisplaced. Irr tachy +s3  Lungs: clear Abdomen: soft, nontender, nondistended. No hepatosplenomegaly. No bruits or masses. Good bowel sounds. Extremities: no cyanosis, clubbing, rash, 1+ edema + cool  Neuro: alert & orientedx3, cranial nerves grossly intact. moves all 4 extremities w/o difficulty. Affect pleasant  Telemetry: AF with RVR Personally reviewed   Labs: Basic Metabolic Panel: Recent Labs  Lab 06/10/20 0000 06/14/20 1330 06/14/20 1543 06/15/20 0118 06/15/20 1300  NA 135 131*  --  132* 130*  K 4.7 5.2* 5.5* 6.6* 3.8  CL 94* 95*  --  97* 92*  CO2 26 19*  --  9* 21*  GLUCOSE 165* 186*  --  96 186*  BUN 31* 68*  --  79* 87*  CREATININE 1.23 2.64*  --  3.21* 2.95*  CALCIUM 9.2 8.7*  --  8.6* 8.3*  MG  --  2.1  --   --   --     Liver Function Tests: Recent Labs  Lab 06/14/20 1330 06/15/20 0118 06/15/20  1300  AST 110* 342* 3,005*  ALT 87* 239* 1,397*  ALKPHOS 80 85 76  BILITOT 1.2 2.2* 1.9*  PROT 6.7 6.7 6.1*  ALBUMIN 3.0* 3.0* 2.8*   No results for input(s): LIPASE, AMYLASE in the last 168 hours. No results for input(s): AMMONIA in the last 168 hours.  CBC: Recent Labs  Lab 06/14/20 1330 06/15/20 0118  WBC 8.0 12.9*  NEUTROABS 6.4  --    HGB 10.8* 11.7*  HCT 36.6* 40.5  MCV 81.9 83.2  PLT 329 394    Cardiac Enzymes: No results for input(s): CKTOTAL, CKMB, CKMBINDEX, TROPONINI in the last 168 hours.  BNP: BNP (last 3 results) Recent Labs    05/26/20 1429 06/15/20 0729  BNP 1,609.8* 2,424.4*    ProBNP (last 3 results) Recent Labs    07/11/19 1107 04/23/20 1446  PROBNP 445 2,488*     CBG: Recent Labs  Lab 06/15/20 0100 06/15/20 0613 06/15/20 0727 06/15/20 1129 06/15/20 1542  GLUCAP 95 153* 208* 172* 122*    Coagulation Studies: No results for input(s): LABPROT, INR in the last 72 hours.  Other results: EKG: AF 101 with IVCD anterolateral and inferior Qs (theses were present in 12/21) Personally reviewed   Imaging: CT ABDOMEN PELVIS WO CONTRAST  Result Date: 06/14/2020 CLINICAL DATA:  Abdominal pain and distension. Recent hospitalization for atrial fibrillation. Fell last night and today. Bilateral leg weakness. EXAM: CT CHEST, ABDOMEN AND PELVIS WITHOUT CONTRAST TECHNIQUE: Multidetector CT imaging of the chest, abdomen and pelvis was performed following the standard protocol without IV contrast. COMPARISON:  Pelvis CT dated 10/15/2014. Chest radiographs obtained earlier today. FINDINGS: CT CHEST FINDINGS Cardiovascular: Enlarged heart. Atheromatous calcifications, including the coronary arteries and aorta. Mediastinum/Nodes: No enlarged mediastinal, hilar, or axillary lymph nodes. Thyroid gland, trachea, and esophagus demonstrate no significant findings. Lungs/Pleura: Small left upper lobe calcified granuloma. Normal vascularity. No airspace consolidation or pleural fluid. Musculoskeletal: Thoracic and lower cervical spine degenerative changes. No fractures. CT ABDOMEN PELVIS FINDINGS Hepatobiliary: Mildly heterogeneous, medium density the in the gallbladder. No gallbladder wall thickening or pericholecystic fluid seen. Unremarkable liver. Pancreas: Moderate pancreatic atrophy. Spleen: Normal in size  and shape. Small probable cyst or hemangioma. Small calcified granuloma. Adrenals/Urinary Tract: Bilateral renal cysts. The largest is in the lower pole of the right kidney and measures 7 cm in maximum diameter with a small amount of thin wall calcification. Adjacent calculi in the upper pole of the right kidney, the largest measuring 1.4 cm. 1.0 cm left renal pelvis calculus. No hydronephrosis. Normal appearing ureters. Poorly distended urinary bladder. Stomach/Bowel: Unremarkable stomach, small bowel and colon. No evidence of appendicitis. Vascular/Lymphatic: Mildly prominent porta hepatis and portacaval lymph nodes with no pathologically enlarged nodes. Atheromatous arterial calcifications. Reproductive: Markedly enlarged prostate gland containing several small surgical clips or radioactive seeds. Other: Small amount of free peritoneal fluid. Mild subcutaneous edema. Musculoskeletal: Lumbar spine degenerative changes. IMPRESSION: 1. No acute abnormality. 2. Bilateral nonobstructing renal calculi. 3. Probable borderline reactive adenopathy in the right upper abdomen. 4.  Calcific coronary artery and aortic atherosclerosis. 5. Cardiomegaly. 6. Markedly enlarged prostate gland. Electronically Signed   By: Claudie Revering M.D.   On: 06/14/2020 17:50   DG Chest 2 View  Result Date: 06/14/2020 CLINICAL DATA:  Shortness of breath and weakness.  Falling. EXAM: CHEST - 2 VIEW COMPARISON:  05/27/2020 FINDINGS: Lungs are adequately inflated with resolution of previously seen small left effusion. No acute airspace process. Mild stable cardiomegaly. Remainder of the exam is unchanged. IMPRESSION:  1. No acute cardiopulmonary disease. 2. Mild stable cardiomegaly. Electronically Signed   By: Marin Olp M.D.   On: 06/14/2020 14:01   CT Head Wo Contrast  Result Date: 06/14/2020 CLINICAL DATA:  Fall. EXAM: CT HEAD WITHOUT CONTRAST TECHNIQUE: Contiguous axial images were obtained from the base of the skull through the vertex  without intravenous contrast. COMPARISON:  None. FINDINGS: Brain: No evidence of acute infarction, hemorrhage, hydrocephalus, extra-axial collection or mass lesion/mass effect. Mild generalized cerebral atrophy, within normal limits for age. Scattered mild periventricular and subcortical white matter hypodensities are nonspecific, but favored to reflect chronic microvascular ischemic changes. Vascular: Calcified atherosclerosis at the skullbase. No hyperdense vessel. Skull: Normal. Negative for fracture or focal lesion. Sinuses/Orbits: No acute finding. Other: None. IMPRESSION: 1. No acute intracranial abnormality. 2. Mild chronic microvascular ischemic changes. Electronically Signed   By: Titus Dubin M.D.   On: 06/14/2020 17:35   CT Chest Wo Contrast  Result Date: 06/14/2020 CLINICAL DATA:  Abdominal pain and distension. Recent hospitalization for atrial fibrillation. Fell last night and today. Bilateral leg weakness. EXAM: CT CHEST, ABDOMEN AND PELVIS WITHOUT CONTRAST TECHNIQUE: Multidetector CT imaging of the chest, abdomen and pelvis was performed following the standard protocol without IV contrast. COMPARISON:  Pelvis CT dated 10/15/2014. Chest radiographs obtained earlier today. FINDINGS: CT CHEST FINDINGS Cardiovascular: Enlarged heart. Atheromatous calcifications, including the coronary arteries and aorta. Mediastinum/Nodes: No enlarged mediastinal, hilar, or axillary lymph nodes. Thyroid gland, trachea, and esophagus demonstrate no significant findings. Lungs/Pleura: Small left upper lobe calcified granuloma. Normal vascularity. No airspace consolidation or pleural fluid. Musculoskeletal: Thoracic and lower cervical spine degenerative changes. No fractures. CT ABDOMEN PELVIS FINDINGS Hepatobiliary: Mildly heterogeneous, medium density the in the gallbladder. No gallbladder wall thickening or pericholecystic fluid seen. Unremarkable liver. Pancreas: Moderate pancreatic atrophy. Spleen: Normal in size  and shape. Small probable cyst or hemangioma. Small calcified granuloma. Adrenals/Urinary Tract: Bilateral renal cysts. The largest is in the lower pole of the right kidney and measures 7 cm in maximum diameter with a small amount of thin wall calcification. Adjacent calculi in the upper pole of the right kidney, the largest measuring 1.4 cm. 1.0 cm left renal pelvis calculus. No hydronephrosis. Normal appearing ureters. Poorly distended urinary bladder. Stomach/Bowel: Unremarkable stomach, small bowel and colon. No evidence of appendicitis. Vascular/Lymphatic: Mildly prominent porta hepatis and portacaval lymph nodes with no pathologically enlarged nodes. Atheromatous arterial calcifications. Reproductive: Markedly enlarged prostate gland containing several small surgical clips or radioactive seeds. Other: Small amount of free peritoneal fluid. Mild subcutaneous edema. Musculoskeletal: Lumbar spine degenerative changes. IMPRESSION: 1. No acute abnormality. 2. Bilateral nonobstructing renal calculi. 3. Probable borderline reactive adenopathy in the right upper abdomen. 4.  Calcific coronary artery and aortic atherosclerosis. 5. Cardiomegaly. 6. Markedly enlarged prostate gland. Electronically Signed   By: Claudie Revering M.D.   On: 06/14/2020 17:50   ECHOCARDIOGRAM LIMITED  Result Date: 06/15/2020    ECHOCARDIOGRAM LIMITED REPORT   Patient Name:   MUNACHIMSO PALIN Date of Exam: 06/15/2020 Medical Rec #:  [id number]    Height:       68.0 in Accession #:    [id number]   Weight:       209.0 lb Date of Birth:  01-05-1940    BSA:          2.082 m Patient Age:    34 years     BP:           119/71 mmHg Patient Gender: M  HR:           108 bpm. Exam Location:  Inpatient Procedure: Limited Echo, Cardiac Doppler, Color Doppler and Intracardiac            Opacification Agent Indications:    Shock  History:        Patient has prior history of Echocardiogram examinations, most                 recent 05/05/2020. CAD,  Arrythmias:Atrial Fibrillation; Risk                 Factors:Hypertension, Dyslipidemia, Sleep Apnea and Diabetes.                 S/P PCI.  Sonographer:    Clayton Lefort RDCS (AE) Referring Phys: [id number] [place]  1. Left ventricular ejection fraction, by estimation, is <20%. The left ventricle has severely decreased function. The left ventricle demonstrates global hypokinesis. The left ventricular internal cavity size was severely dilated. Left ventricular diastolic function could not be evaluated.  2. Right ventricular systolic function is severely reduced. The right ventricular size is moderately enlarged. There is moderately elevated pulmonary artery systolic pressure. The estimated right ventricular systolic pressure is 56.2 mmHg.  3. Right atrial size was severely dilated.  4. The mitral valve is degenerative. Moderate mitral valve regurgitation. No evidence of mitral stenosis.  5. The aortic valve is tricuspid. Aortic valve regurgitation is not visualized. Mild aortic valve sclerosis is present, with no evidence of aortic valve stenosis.  6. The inferior vena cava is dilated in size with <50% respiratory variability, suggesting right atrial pressure of 15 mmHg. FINDINGS  Left Ventricle: Left ventricular ejection fraction, by estimation, is <20%. The left ventricle has severely decreased function. The left ventricle demonstrates global hypokinesis. Definity contrast agent was given IV to delineate the left ventricular endocardial borders. The left ventricular internal cavity size was severely dilated. There is no left ventricular hypertrophy. Left ventricular diastolic function could not be evaluated. Left ventricular diastolic function could not be evaluated due to atrial fibrillation. Right Ventricle: The right ventricular size is moderately enlarged. No increase in right ventricular wall thickness. Right ventricular systolic function is severely reduced. There is moderately elevated  pulmonary artery systolic pressure. The tricuspid regurgitant velocity is 3.31 m/s, and with an assumed right atrial pressure of 15 mmHg, the estimated right ventricular systolic pressure is 13.0 mmHg. Left Atrium: Left atrial size was normal in size. Right Atrium: Right atrial size was severely dilated. Pericardium: There is no evidence of pericardial effusion. Mitral Valve: The mitral valve is degenerative in appearance. There is mild thickening of the mitral valve leaflet(s). There is mild calcification of the mitral valve leaflet(s). Moderate mitral valve regurgitation. No evidence of mitral valve stenosis. Tricuspid Valve: The tricuspid valve is normal in structure. Tricuspid valve regurgitation is mild . No evidence of tricuspid stenosis. Aortic Valve: The aortic valve is tricuspid. Aortic valve regurgitation is not visualized. Mild aortic valve sclerosis is present, with no evidence of aortic valve stenosis. Pulmonic Valve: The pulmonic valve was normal in structure. Pulmonic valve regurgitation is not visualized. No evidence of pulmonic stenosis. Aorta: The aortic root is normal in size and structure. Venous: The inferior vena cava is dilated in size with less than 50% respiratory variability, suggesting right atrial pressure of 15 mmHg. IAS/Shunts: No atrial level shunt detected by color flow Doppler. LEFT VENTRICLE PLAX 2D LVIDd:         6.40 cm  LVIDs:         5.80 cm LV PW:         0.90 cm LV IVS:        0.80 cm LVOT diam:     2.10 cm LVOT Area:     3.46 cm  IVC IVC diam: 2.70 cm LEFT ATRIUM         Index LA diam:    3.50 cm 1.68 cm/m   AORTA Ao Root diam: 3.40 cm Ao Asc diam:  3.40 cm MR Peak grad: 66.9 mmHg   TRICUSPID VALVE MR Mean grad: 45.0 mmHg   TR Peak grad:   43.8 mmHg MR Vmax:      409.00 cm/s TR Vmax:        331.00 cm/s MR Vmean:     317.0 cm/s                           SHUNTS                           Systemic Diam: 2.10 cm Fransico Him MD Electronically signed by Fransico Him MD Signature  Date/Time: 06/15/2020/11:02:38 AM    Final        Assessment/Plan:    Acute systolic HF -> 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 NE now off - Will place central access and follow CVP and co-ox. Will almost certainly need inotropic support - Once more stable will need restoration of NSR with amio and inotropic support - Volume status improved with IV lasix today - watch closely for need for mechanical support - doubt ischemic as hstrop low despite profound hemodynamic support but given h/p CAD and multiple CRFs will need ischemic eval once more stable and renal function improves  2. CAD - s/p previous LAD stenting - doubt ischemia is major factor in HF given low hsTrop  - plan as above - continue ASA - hold statin with shock liver  3. AF with RVR - plan as above.  - IV amio - switch Eliquis to heparin in case he needs mechanical support  - DC-CV this admission   4. AKI - due to ATN and shock - improving with hemodynamic support  5. Shock liver - due to shock - supportive care  6. DM2 - SSI   7. Code status - he was made DNR/DNI but was pretty functional prior to developing HF from AF. I think he has a likel reversible cause for his deterioration.  - I discussed with him and will be Full Code for now.    CRITICAL CARE Performed by: Glori Bickers  Total critical care time: 55 minutes  Critical care time was exclusive of separately billable procedures and treating other patients.  Critical care was necessary to treat or prevent imminent or life-threatening deterioration.  Critical care was time spent personally by me (independent of midlevel providers or residents) on the following activities: development of treatment plan with patient and/or surrogate as well as  nursing, discussions with consultants, evaluation of patient's response to treatment, examination of patient, obtaining history from patient or surrogate, ordering and performing treatments and interventions, ordering and review of laboratory studies, ordering and review of radiographic studies, pulse oximetry and re-evaluation  of patient's condition.     Length of Stay: 1   Glori Bickers MD  06/15/2020, 4:53 PM  Advanced Heart Failure Team Pager [phone] (M-F; [place])  Please contact [facility] for night-coverage after hours (4p -7a ) and weekends on [email or web address]

## 2020-06-15 NOTE — Significant Event (Signed)
Rapid Response Event Note   Reason for Call :  SBP-80s, order for pressors and tx to ICU  Per CN, pt neuro intact, PCCM consulted, and they are coming to bedside. CN asked to check BP q28min, notify RRT when vasopressor arrives to floor, and call RRT if pt status worsens. Pt seen at 2350  Initial Focused Assessment:  Pt lying in bed with eyes open, alert and oriented, in no distress. Pt does c/o some SOB, denies chest pain. Lungs diminished t/o. Skin cool to touch.  T-96.7, HR-98(Afib), ZO-10/96(04), RR-22, SpO2-100% on 2L [place].    Interventions:  Neo if needed for BP support-later d/c'd prior to starting PCCM consulted: Levo if MAP<65  Tx to [facility] of Care:  Pt tx to 2H20.   Event Summary:   MD Notified: Dr. Hal Hope notified PTA RRT, PCCM [facility] consulted and came to bedside Call [place], Porshe Fleagle Anderson, RN

## 2020-06-15 NOTE — Plan of Care (Signed)
PCCM Goals of Care Discussion and Advanced Care Planning:   Date: 06/15/2020   Present Parties: Patient, wife, daughter, bedside nurse Loma Sousa, RN  What was discussed:  We met at bedside had a heartfelt discussion about next steps for Mr. Plante.  We discussed all of the things that occurred overnight and his current.  At this point he is off pressors, stable blood pressure on Lasix making a small amount of urine.  They understand that he has multiple chronically failing organ systems and now has decompensated heart failure with acute kidney injury.  They would like to do everything they can without heroic measures.  My recommendation would be that they continue Lasix with no further escalation of care.  We explained that if his kidney function gets worse that he will likely pass from this disease and they are accepting of this process.  Outcome:  Full DNR Transfer from the ICU Remain on [facility], hospitalist service Dr. Wynelle Cleveland. Discussed outcomes of this meeting with all parties involved.  Recommend inpatient palliative care consultation.   21 mins of time was spent discussing the goals of care, advanced care planning options such as code status as well as do not resuscitate forms. [phone])

## 2020-06-15 NOTE — Plan of Care (Signed)
Notified that patient having worsening acidosis and hyperkalemia all suggestive of worsening AKI and cardiogenic shock. Was alert and appeared more appropriate at this time but given that he was confused at 4 AM when we had stopped by we discussed potential dialysis with the wife(Betty). She does not want to see patient suffer needlessly. She notes that he is already debilitated at home and thinks that even if his renal function improves he will still have poor quality of life. Patient seems willing to give dialysis a shot however given that he was altered earlier in them morning think it best to have discussion with family together in room before placing central access for dialysis. Wife called and on the way in. Daughter is also coming to discuss options as well. They are okay with palliative care consult as wife really want to focus on patient comfort and does not want to see him suffer.   Newell Coral DO Internal Medicine/Pediatrics Pulmonary and Critical Care Fellow PGY-7

## 2020-06-16 ENCOUNTER — Other Ambulatory Visit: Payer: Self-pay

## 2020-06-16 DIAGNOSIS — Z7901 Long term (current) use of anticoagulants: Secondary | ICD-10-CM | POA: Diagnosis not present

## 2020-06-16 DIAGNOSIS — N179 Acute kidney failure, unspecified: Secondary | ICD-10-CM | POA: Diagnosis not present

## 2020-06-16 DIAGNOSIS — Z515 Encounter for palliative care: Secondary | ICD-10-CM | POA: Diagnosis not present

## 2020-06-16 DIAGNOSIS — Z7189 Other specified counseling: Secondary | ICD-10-CM

## 2020-06-16 DIAGNOSIS — E0810 Diabetes mellitus due to underlying condition with ketoacidosis without coma: Secondary | ICD-10-CM | POA: Diagnosis not present

## 2020-06-16 DIAGNOSIS — I509 Heart failure, unspecified: Secondary | ICD-10-CM

## 2020-06-16 DIAGNOSIS — R531 Weakness: Secondary | ICD-10-CM | POA: Diagnosis not present

## 2020-06-16 DIAGNOSIS — R570 Cardiogenic shock: Secondary | ICD-10-CM | POA: Diagnosis not present

## 2020-06-16 LAB — URINALYSIS, ROUTINE W REFLEX MICROSCOPIC
Bilirubin Urine: NEGATIVE
Glucose, UA: NEGATIVE mg/dL
Ketones, ur: NEGATIVE mg/dL
Nitrite: POSITIVE — AB
Protein, ur: >300 mg/dL — AB
Specific Gravity, Urine: 1.015 (ref 1.005–1.030)
pH: 7 (ref 5.0–8.0)

## 2020-06-16 LAB — COMPREHENSIVE METABOLIC PANEL
ALT: 1864 U/L — ABNORMAL HIGH (ref 0–44)
AST: 3132 U/L — ABNORMAL HIGH (ref 15–41)
Albumin: 2.8 g/dL — ABNORMAL LOW (ref 3.5–5.0)
Alkaline Phosphatase: 96 U/L (ref 38–126)
Anion gap: 14 (ref 5–15)
BUN: 88 mg/dL — ABNORMAL HIGH (ref 8–23)
CO2: 30 mmol/L (ref 22–32)
Calcium: 8.4 mg/dL — ABNORMAL LOW (ref 8.9–10.3)
Chloride: 87 mmol/L — ABNORMAL LOW (ref 98–111)
Creatinine, Ser: 2.71 mg/dL — ABNORMAL HIGH (ref 0.61–1.24)
GFR, Estimated: 23 mL/min — ABNORMAL LOW (ref >60)
Glucose, Bld: 117 mg/dL — ABNORMAL HIGH (ref 70–99)
Potassium: 2.9 mmol/L — ABNORMAL LOW (ref 3.5–5.1)
Sodium: 131 mmol/L — ABNORMAL LOW (ref 135–145)
Total Bilirubin: 2.3 mg/dL — ABNORMAL HIGH (ref 0.3–1.2)
Total Protein: 6.1 g/dL — ABNORMAL LOW (ref 6.5–8.1)

## 2020-06-16 LAB — APTT
aPTT: 121 seconds — ABNORMAL HIGH (ref 24–36)
aPTT: 147 seconds — ABNORMAL HIGH (ref 24–36)
aPTT: 159 seconds — ABNORMAL HIGH (ref 24–36)

## 2020-06-16 LAB — CBC
HCT: 33.6 % — ABNORMAL LOW (ref 39.0–52.0)
Hemoglobin: 10.2 g/dL — ABNORMAL LOW (ref 13.0–17.0)
MCH: 24 pg — ABNORMAL LOW (ref 26.0–34.0)
MCHC: 30.4 g/dL (ref 30.0–36.0)
MCV: 79.1 fL — ABNORMAL LOW (ref 80.0–100.0)
Platelets: 317 10*3/uL (ref 150–400)
RBC: 4.25 MIL/uL (ref 4.22–5.81)
RDW: 19.2 % — ABNORMAL HIGH (ref 11.5–15.5)
WBC: 9.9 10*3/uL (ref 4.0–10.5)
nRBC: 0.2 % (ref 0.0–0.2)

## 2020-06-16 LAB — LACTIC ACID, PLASMA: Lactic Acid, Venous: 2.1 mmol/L (ref 0.5–1.9)

## 2020-06-16 LAB — COOXEMETRY PANEL
Carboxyhemoglobin: 1 % (ref 0.5–1.5)
Carboxyhemoglobin: 1.1 % (ref 0.5–1.5)
Carboxyhemoglobin: 1.2 % (ref 0.5–1.5)
Methemoglobin: 0.9 % (ref 0.0–1.5)
Methemoglobin: 0.7 % (ref 0.0–1.5)
Methemoglobin: 0.7 % (ref 0.0–1.5)
O2 Saturation: 39.6 %
O2 Saturation: 45.8 %
O2 Saturation: 52.2 %
Total hemoglobin: 10.5 g/dL — ABNORMAL LOW (ref 12.0–16.0)
Total hemoglobin: 10.8 g/dL — ABNORMAL LOW (ref 12.0–16.0)
Total hemoglobin: 10.4 g/dL — ABNORMAL LOW (ref 12.0–16.0)

## 2020-06-16 LAB — BASIC METABOLIC PANEL
Anion gap: 15 (ref 5–15)
BUN: 88 mg/dL — ABNORMAL HIGH (ref 8–23)
CO2: 29 mmol/L (ref 22–32)
Calcium: 8.4 mg/dL — ABNORMAL LOW (ref 8.9–10.3)
Chloride: 86 mmol/L — ABNORMAL LOW (ref 98–111)
Creatinine, Ser: 2.75 mg/dL — ABNORMAL HIGH (ref 0.61–1.24)
GFR, Estimated: 22 mL/min — ABNORMAL LOW (ref >60)
Glucose, Bld: 184 mg/dL — ABNORMAL HIGH (ref 70–99)
Potassium: 3.3 mmol/L — ABNORMAL LOW (ref 3.5–5.1)
Sodium: 130 mmol/L — ABNORMAL LOW (ref 135–145)

## 2020-06-16 LAB — MAGNESIUM: Magnesium: 2.1 mg/dL (ref 1.7–2.4)

## 2020-06-16 LAB — URINALYSIS, MICROSCOPIC (REFLEX): RBC / HPF: >50 RBC/hpf (ref 0–5)

## 2020-06-16 LAB — URINE CULTURE: Culture: NO GROWTH

## 2020-06-16 LAB — GLUCOSE, CAPILLARY
Glucose-Capillary: 103 mg/dL — ABNORMAL HIGH (ref 70–99)
Glucose-Capillary: 164 mg/dL — ABNORMAL HIGH (ref 70–99)
Glucose-Capillary: 139 mg/dL — ABNORMAL HIGH (ref 70–99)
Glucose-Capillary: 200 mg/dL — ABNORMAL HIGH (ref 70–99)

## 2020-06-16 LAB — HEPARIN LEVEL (UNFRACTIONATED): Heparin Unfractionated: >1.1 IU/mL — ABNORMAL HIGH (ref 0.30–0.70)

## 2020-06-16 MED ORDER — POTASSIUM CHLORIDE CRYS ER 20 MEQ PO TBCR
40.0000 meq | EXTENDED_RELEASE_TABLET | ORAL | Status: DC
  Administered 2020-06-16: 40 meq via ORAL
  Filled 2020-06-16: qty 2

## 2020-06-16 MED ORDER — POTASSIUM CHLORIDE CRYS ER 20 MEQ PO TBCR
40.0000 meq | EXTENDED_RELEASE_TABLET | Freq: Once | ORAL | Status: AC
  Administered 2020-06-16: 40 meq via ORAL
  Filled 2020-06-16: qty 2

## 2020-06-16 MED ORDER — ONDANSETRON HCL 4 MG/2ML IJ SOLN
4.0000 mg | Freq: Four times a day (QID) | INTRAMUSCULAR | Status: DC | PRN
  Administered 2020-06-16 – 2020-07-06 (×4): 4 mg via INTRAVENOUS
  Filled 2020-06-16 (×4): qty 2

## 2020-06-16 MED ORDER — HEPARIN (PORCINE) 25000 UT/250ML-% IV SOLN
1300.0000 [IU]/h | INTRAVENOUS | Status: DC
  Administered 2020-06-16: 950 [IU]/h via INTRAVENOUS
  Administered 2020-06-19: 750 [IU]/h via INTRAVENOUS
  Administered 2020-06-20: 850 [IU]/h via INTRAVENOUS
  Administered 2020-06-21: 1100 [IU]/h via INTRAVENOUS
  Administered 2020-06-22: 1300 [IU]/h via INTRAVENOUS
  Filled 2020-06-16 (×5): qty 250

## 2020-06-16 MED ORDER — POTASSIUM CHLORIDE 10 MEQ/50ML IV SOLN
10.0000 meq | INTRAVENOUS | Status: AC
  Administered 2020-06-16 (×4): 10 meq via INTRAVENOUS
  Filled 2020-06-16 (×4): qty 50

## 2020-06-16 NOTE — Progress Notes (Addendum)
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  He had complaints of bilateral weakness in his legs.    The patient was recently admitted from 5/23 through 3/70 for acute systolic heart failure he was discharged home on 40 mg of Lasix twice daily.  The patient was previously on 20 mg of furosemide daily.  During that hospital stay he underwent successful DC cardioversion on 5/25. 6/6: There is a documented phone call note by Dr. Tamala Julian stating to hold Jardiance furosemide and losartan and come back for a bmet in 48 to 72 hours.  He recommended to stop furosemide for 48 hours and then to drop down to 20 mg daily.  6/8: Note by cardiology PA > patient was back in A. fib with heart rate in the low 100s but blood pressure was low and amiodarone was increased from 200 mg daily to 200 mg twice daily. Cont to hold Lasix  6/10: Cardiology (EP) visit> note mentions cardioverting again to get back into normal sinus rhythm - amiodarone was increased to 400 twice a day.  6/11:  AKI noted with a creatinine of 2.64-Baseline creatinine around 1.2-1.3 Acidosis with a bicarb of 19 Sodium 131 Potassium 5.2> 5.5> 6.6 LFTs elevated: AST 110, ALT 87 Noted to be going in and out of rapid A. fib Lactic acid 8.5 Troponin 65 Pulse ox was 98-100% on room air Rapid response called later in the day due to systolic blood pressures of the in the 80s-she was started on levophed, given a Lasix bolus and drip-Foley placed-cardiology consulted  Subjective: He has no complaints today.    Assessment & Plan:   Principal Problem:    ARF (acute renal failure)- prerenal in setting of poor EF and poor perfusion related to A-fib with RVR   CKD 3a Chronic systolic CHF, right and left heart failure/ Dilated Cardiomyopathy - baseline CR ~ 1.2 - on admission, Creatinine 2.64 > 3.21   - he is making urine and has had > 500 cc out this AM - 2D ECHO> EF < 20% global hypokinesis of LV - RV function severely reduced, RA severely dilated, mod MR - Appreciate CHF team managing - started on Milrinone and Levophed    Active Problems: Hyperkalemia- now hypokalemia - Possibly related to AKI-potassium rose from 5.2-6.6 (not hemolyzed)-improved with 2 A of bicarb, 5 units of insulin (with D50) Lokelma and Lasix  - now low and I have replaced it today  Lactic acidosis, metabolic acidosis - LA 9.9 - down to 4.8 now - likely due to Metformin in the presence of AKI and poor perfusion related to poor EF and A-fib with RVR - dc Metformin altogether as being on Lasix puts him a risk for recurrent AKI - Jardiance has already been on hold as outpt and will continue to hold    Persistent atrial fibrillation - likely the cause for his weakness in addition to dehydration - has been on amiodarone 400 mg twice daily which (increased to this recently by EP cardiology on 6/10) - Coreg on hold due to hypotension - Eliquis switched to Heparin - I have  contacted Cards to ask for a cardioversion while in the hospital- now on IV Amio- HR in 80-90s    Gross hematuria - pink, blood tinged urine today- check cbc tomorrow  Elevated LFTs- shock liver - following   3,132  AST   1,864  ALT          UA consistent with a UTI - hold off on antibiotics and follow    DM2 -A1c 7.5 on 5/23 - holding Metformin - He takes Tujeo 25 mg QHS- reduced to 10 QHS - sugars stable today - cont low dose SSI      CAD in native artery - h/o stenting in the past (3 stents) - cont Apixaban- can hold Lipitor until LFTs improve  Morbid obesity Body mass index is  30.64 kg/m.     Time spent in minutes: 25   Antimicrobials:  Anti-infectives (From admission, onward)    None        Objective: Vitals:   06/16/20 1500 06/16/20 1545 06/16/20 1600 06/16/20 1700  BP: 126/75  129/63 115/62  Pulse: 80  80 75  Resp: 18  16 (!) 22  Temp:  97.7 F (36.5 C)    TempSrc:  Oral    SpO2: 100%  100% 100%  Weight:      Height:        Intake/Output Summary (Last 24 hours) at 06/16/2020 1731 Last data filed at 06/16/2020 1700 Gross per 24 hour  Intake 1001.18 ml  Output 3890 ml  Net -2888.82 ml    Filed Weights   06/14/20 2306 06/15/20 0041 06/16/20 0500  Weight: 94.6 kg 94.8 kg 91.4 kg    Examination: General exam: Appears comfortable  HEENT: PERRLA, oral mucosa moist, no sclera icterus or thrush Respiratory system: Clear to auscultation. Respiratory effort normal. Cardiovascular system: S1 & S2 heard, regular rate and rhythm- IRR Gastrointestinal system: Abdomen soft, non-tender, nondistended. Normal bowel sounds   Central nervous system: Alert and oriented. No focal neurological deficits. Extremities: No cyanosis, clubbing or edema Skin: No rashes or ulcers Psychiatry:  Mood & affect appropriate.      Data Reviewed: I have personally reviewed following labs and imaging studies  CBC: Recent Labs  Lab 06/14/20 1330 06/15/20 0118 06/16/20 0251  WBC 8.0 12.9* 9.9  NEUTROABS 6.4  --   --   HGB 10.8* 11.7* 10.2*  HCT 36.6* 40.5 33.6*  MCV 81.9 83.2 79.1*  PLT 329 394 195    Basic Metabolic Panel: Recent Labs  Lab 06/14/20 1330 06/14/20 1543 06/15/20 0118 06/15/20 1300 06/16/20 0251 06/16/20 1136  NA 131*  --  132* 130* 131* 130*  K 5.2* 5.5* 6.6* 3.8 2.9* 3.3*  CL 95*  --  97* 92* 87* 86*  CO2 19*  --  9* 21* 30 29  GLUCOSE 186*  --  96 186* 117* 184*  BUN 68*  --  79* 87* 88* 88*  CREATININE 2.64*  --  3.21* 2.95* 2.71* 2.75*  CALCIUM 8.7*  --  8.6* 8.3* 8.4* 8.4*  MG 2.1  --   --   --  2.1  --      GFR: Estimated Creatinine Clearance: 22.7 mL/min (A) (by C-G formula based on SCr of 2.75 mg/dL (H)). Liver Function Tests: Recent Labs  Lab 06/14/20 1330 06/15/20 0118 06/15/20 1300 06/16/20 0251  AST 110* 342* 3,005* 3,132*  ALT 87* 239* 1,397* 1,864*  ALKPHOS 80 85 76 96  BILITOT 1.2 2.2* 1.9* 2.3*  PROT 6.7  6.7 6.1* 6.1*  ALBUMIN 3.0* 3.0* 2.8* 2.8*    No results for input(s): LIPASE, AMYLASE in the last 168 hours. No results for input(s): AMMONIA in the last 168 hours. Coagulation Profile: No results for input(s): INR, PROTIME in the last 168 hours. Cardiac Enzymes: No results for input(s): CKTOTAL, CKMB, CKMBINDEX, TROPONINI in the last 168 hours. BNP (last 3 results) Recent Labs    07/11/19 1107 04/23/20 1446  PROBNP 445 2,488*    HbA1C: No results for input(s): HGBA1C in the last 72 hours. CBG: Recent Labs  Lab 06/15/20 1542 06/15/20 2143 06/16/20 0702 06/16/20 1132 06/16/20 1544  GLUCAP 122* 129* 103* 164* 139*    Lipid Profile: No results for input(s): CHOL, HDL, LDLCALC, TRIG, CHOLHDL, LDLDIRECT in the last 72 hours. Thyroid Function Tests: Recent Labs    06/14/20 2030  TSH 7.588*    Anemia Panel: No results for input(s): VITAMINB12, FOLATE, FERRITIN, TIBC, IRON, RETICCTPCT in the last 72 hours. Urine analysis:    Component Value Date/Time   COLORURINE AMBER (A) 06/15/2020 0222   APPEARANCEUR TURBID (A) 06/15/2020 0222   LABSPEC 1.017 06/15/2020 0222   PHURINE 5.0 06/15/2020 0222   GLUCOSEU 50 (A) 06/15/2020 0222   HGBUR LARGE (A) 06/15/2020 0222   BILIRUBINUR NEGATIVE 06/15/2020 0222   KETONESUR NEGATIVE 06/15/2020 0222   PROTEINUR 100 (A) 06/15/2020 0222   NITRITE NEGATIVE 06/15/2020 0222   LEUKOCYTESUR SMALL (A) 06/15/2020 0222   Sepsis Labs: @LABRCNTIP (procalcitonin:4,lacticidven:4) ) Recent Results (from the past 240 hour(s))  Resp Panel by RT-PCR (Flu A&B, Covid) Nasopharyngeal Swab     Status: None   Collection Time:  06/14/20  3:31 PM   Specimen: Nasopharyngeal Swab; Nasopharyngeal(NP) swabs in vial transport medium  Result Value Ref Range Status   SARS Coronavirus 2 by RT PCR NEGATIVE NEGATIVE Final    Comment: (NOTE) SARS-CoV-2 target nucleic acids are NOT DETECTED.  The SARS-CoV-2 RNA is generally detectable in upper respiratory specimens during the acute phase of infection. The lowest concentration of SARS-CoV-2 viral copies this assay can detect is 138 copies/mL. A negative result does not preclude SARS-Cov-2 infection and should not be used as the sole basis for treatment or other patient management decisions. A negative result may occur with  improper specimen collection/handling, submission of specimen other than nasopharyngeal swab, presence of viral mutation(s) within the areas targeted by this assay, and inadequate number of viral copies(<138 copies/mL). A negative result must be combined with clinical observations, patient history, and epidemiological information. The expected result is Negative.  Fact Sheet for Patients:  EntrepreneurPulse.com.au  Fact Sheet for Healthcare Providers:  IncredibleEmployment.be  This test is no t yet approved or cleared by the [place] FDA and  has been authorized for detection and/or diagnosis of SARS-CoV-2 by FDA under an Emergency Use Authorization (EUA). This EUA will remain  in effect (meaning this test can be used) for the duration of the COVID-19 declaration under Section 564(b)(1) of the Act, 21 U.S.C.section 360bbb-3(b)(1), unless the authorization is terminated  or revoked sooner.       Influenza A by PCR NEGATIVE NEGATIVE Final   Influenza B by PCR NEGATIVE NEGATIVE Final    Comment: (NOTE) The Xpert Xpress SARS-CoV-2/FLU/RSV plus assay is intended as an aid in the diagnosis of influenza from Nasopharyngeal swab specimens and should not be used as a sole basis for treatment. Nasal washings  and aspirates are unacceptable for Xpert Xpress SARS-CoV-2/FLU/RSV testing.  Fact Sheet for Patients: EntrepreneurPulse.com.au  Fact Sheet  for Healthcare Providers: IncredibleEmployment.be  This test is not yet approved or cleared by the [place] and has been authorized for detection and/or diagnosis of SARS-CoV-2 by FDA under an Emergency Use Authorization (EUA). This EUA will remain in effect (meaning this test can be used) for the duration of the COVID-19 declaration under Section 564(b)(1) of the Act, 21 U.S.C. section 360bbb-3(b)(1), unless the authorization is terminated or revoked.  Performed at [facility], [place] [address] [place], [place] [address]   MRSA Next Gen by PCR, Nasal     Status: None   Collection Time: 06/15/20 12:30 AM  Result Value Ref Range Status   MRSA by PCR Next Gen NOT DETECTED NOT DETECTED Final    Comment: (NOTE) The GeneXpert MRSA Assay (FDA approved for NASAL specimens only), is one component of a comprehensive MRSA colonization surveillance program. It is not intended to diagnose MRSA infection nor to guide or monitor treatment for MRSA infections. Test performance is not FDA approved in patients less than 65 years old. Performed at [facility], [place] [address] [place], [place] [address]   Culture, blood (routine x 2)     Status: None (Preliminary result)   Collection Time: 06/15/20  1:06 AM   Specimen: BLOOD RIGHT HAND  Result Value Ref Range Status   Specimen Description BLOOD RIGHT HAND  Final   Special Requests   Final    BOTTLES DRAWN AEROBIC ONLY Blood Culture adequate volume   Culture   Final    NO GROWTH 1 DAY Performed at [facility], [place] [address] [place], [place] [address]    Report Status PENDING  Incomplete  Culture, blood (routine x 2)     Status: None (Preliminary result)   Collection Time: 06/15/20  1:15 AM   Specimen: BLOOD LEFT HAND  Result Value Ref  Range Status   Specimen Description BLOOD LEFT HAND  Final   Special Requests   Final    BOTTLES DRAWN AEROBIC ONLY Blood Culture results may not be optimal due to an inadequate volume of blood received in culture bottles   Culture   Final    NO GROWTH 1 DAY Performed at [facility], [place] [address] [place], [place] [address]    Report Status PENDING  Incomplete  Culture, Urine     Status: None   Collection Time: 06/15/20  2:22 AM   Specimen: Urine, Random  Result Value Ref Range Status   Specimen Description URINE, RANDOM  Final   Special Requests NONE  Final   Culture   Final    NO GROWTH Performed at [facility], [place] [address], [place], [place] [address]    Report Status 06/16/2020 FINAL  Final         Radiology Studies: DG CHEST PORT 1 VIEW  Result Date: 06/15/2020 CLINICAL DATA:  Central line EXAM: PORTABLE CHEST 1 VIEW COMPARISON:  June 14, 2020 FINDINGS: The cardiomediastinal silhouette is unchanged and enlarged in contour.RIGHT IJ CVC tip terminates over the region of the inferior SVC. Atherosclerotic calcifications of the aorta. No pleural effusion. No pneumothorax. Unchanged LEFT retrocardiac opacity, likely atelectasis no acute pleuroparenchymal abnormality. Visualized abdomen is unremarkable. Multilevel degenerative changes of the thoracic spine. IMPRESSION: RIGHT IJ CVC tip terminates over the region of the inferior SVC. Electronically Signed   By: Valentino Saxon MD   On: 06/15/2020 18:39   ECHOCARDIOGRAM LIMITED  Result Date: 06/15/2020    ECHOCARDIOGRAM  LIMITED REPORT   Patient Name:   Wesley Winters Date of Exam: 06/15/2020 Medical Rec #:  [id number]    Height:       68.0 in Accession #:    [id number]   Weight:       209.0 lb Date of Birth:  [birth year]    BSA:          2.082 m Patient Age:    59 years     BP:           119/71 mmHg Patient Gender: M            HR:           108 bpm. Exam Location:  Inpatient Procedure: Limited Echo, Cardiac Doppler,  Color Doppler and Intracardiac            Opacification Agent Indications:    Shock  History:        Patient has prior history of Echocardiogram examinations, most                 recent 05/05/2020. CAD, Arrythmias:Atrial Fibrillation; Risk                 Factors:Hypertension, Dyslipidemia, Sleep Apnea and Diabetes.                 S/P PCI.  Sonographer:    Clayton Lefort RDCS (AE) Referring Phys: [id number] [place]  1. Left ventricular ejection fraction, by estimation, is <20%. The left ventricle has severely decreased function. The left ventricle demonstrates global hypokinesis. The left ventricular internal cavity size was severely dilated. Left ventricular diastolic function could not be evaluated.  2. Right ventricular systolic function is severely reduced. The right ventricular size is moderately enlarged. There is moderately elevated pulmonary artery systolic pressure. The estimated right ventricular systolic pressure is 02.5 mmHg.  3. Right atrial size was severely dilated.  4. The mitral valve is degenerative. Moderate mitral valve regurgitation. No evidence of mitral stenosis.  5. The aortic valve is tricuspid. Aortic valve regurgitation is not visualized. Mild aortic valve sclerosis is present, with no evidence of aortic valve stenosis.  6. The inferior vena cava is dilated in size with <50% respiratory variability, suggesting right atrial pressure of 15 mmHg. FINDINGS  Left Ventricle: Left ventricular ejection fraction, by estimation, is <20%. The left ventricle has severely decreased function. The left ventricle demonstrates global hypokinesis. Definity contrast agent was given IV to delineate the left ventricular endocardial borders. The left ventricular internal cavity size was severely dilated. There is no left ventricular hypertrophy. Left ventricular diastolic function could not be evaluated. Left ventricular diastolic function could not be evaluated due to atrial fibrillation. Right  Ventricle: The right ventricular size is moderately enlarged. No increase in right ventricular wall thickness. Right ventricular systolic function is severely reduced. There is moderately elevated pulmonary artery systolic pressure. The tricuspid regurgitant velocity is 3.31 m/s, and with an assumed right atrial pressure of 15 mmHg, the estimated right ventricular systolic pressure is 42.7 mmHg. Left Atrium: Left atrial size was normal in size. Right Atrium: Right atrial size was severely dilated. Pericardium: There is no evidence of pericardial effusion. Mitral Valve: The mitral valve is degenerative in appearance. There is mild thickening of the mitral valve leaflet(s). There is mild calcification of the mitral valve leaflet(s). Moderate mitral valve regurgitation. No evidence of mitral valve stenosis. Tricuspid Valve: The tricuspid valve is normal in structure. Tricuspid valve regurgitation is mild . No  evidence of tricuspid stenosis. Aortic Valve: The aortic valve is tricuspid. Aortic valve regurgitation is not visualized. Mild aortic valve sclerosis is present, with no evidence of aortic valve stenosis. Pulmonic Valve: The pulmonic valve was normal in structure. Pulmonic valve regurgitation is not visualized. No evidence of pulmonic stenosis. Aorta: The aortic root is normal in size and structure. Venous: The inferior vena cava is dilated in size with less than 50% respiratory variability, suggesting right atrial pressure of 15 mmHg. IAS/Shunts: No atrial level shunt detected by color flow Doppler. LEFT VENTRICLE PLAX 2D LVIDd:         6.40 cm LVIDs:         5.80 cm LV PW:         0.90 cm LV IVS:        0.80 cm LVOT diam:     2.10 cm LVOT Area:     3.46 cm  IVC IVC diam: 2.70 cm LEFT ATRIUM         Index LA diam:    3.50 cm 1.68 cm/m   AORTA Ao Root diam: 3.40 cm Ao Asc diam:  3.40 cm MR Peak grad: 66.9 mmHg   TRICUSPID VALVE MR Mean grad: 45.0 mmHg   TR Peak grad:   43.8 mmHg MR Vmax:      409.00 cm/s TR  Vmax:        331.00 cm/s MR Vmean:     317.0 cm/s                           SHUNTS                           Systemic Diam: 2.10 cm Fransico Him MD Electronically signed by Fransico Him MD Signature Date/Time: 06/15/2020/11:02:38 AM    Final       Scheduled Meds:  Chlorhexidine Gluconate Cloth  6 each Topical Q0600   insulin aspart  0-9 Units Subcutaneous TID WC   insulin glargine  10 Units Subcutaneous QHS   vitamin B-12  1,000 mcg Oral QHS   Continuous Infusions:  sodium chloride 10 mL/hr at 06/15/20 0600   amiodarone 30 mg/hr (06/16/20 1700)   heparin 950 Units/hr (06/16/20 1703)   milrinone 0.375 mcg/kg/min (06/16/20 1700)   norepinephrine (LEVOPHED) Adult infusion     potassium chloride 10 mEq (06/16/20 1700)     LOS: 2 days      Debbe Odea, MD [facility] Pager: [email or web address] 06/16/2020, 5:31 PM

## 2020-06-16 NOTE — Progress Notes (Addendum)
Advanced Heart Failure Rounding Note  PCP-Cardiologist: Sinclair Grooms, MD   Subjective:   Cardiogenic shock. New onset AF.   6/12 CO-OX 43% --->Started on milrinone 0.25 mcg. Remains on amio drip.   Todays CO-OX 40% Repeat 46%   Denies SOB.    Objective:   Weight Range: 91.4 kg Body mass index is 30.64 kg/m.   Vital Signs:   Temp:  [97.88 F (36.6 C)-99.3 F (37.4 C)] 97.88 F (36.6 C) (06/13 0600) Pulse Rate:  [62-126] 82 (06/13 0600) Resp:  [12-27] 14 (06/13 0600) BP: (95-141)/(59-128) 137/79 (06/13 0600) SpO2:  [91 %-100 %] 100 % (06/13 0600) Weight:  [91.4 kg] 91.4 kg (06/13 0500) Last BM Date:  (pta 6/10)  Weight change: Filed Weights   06/14/20 2306 06/15/20 0041 06/16/20 0500  Weight: 94.6 kg 94.8 kg 91.4 kg    Intake/Output:   Intake/Output Summary (Last 24 hours) at 06/16/2020 0731 Last data filed at 06/16/2020 0700 Gross per 24 hour  Intake 948.07 ml  Output 4450 ml  Net -3501.93 ml      Physical Exam   CVP 7  General:  No resp difficulty HEENT: Normal Neck: Supple. JVP flat . Carotids 2+ bilat; no bruits. No lymphadenopathy or thyromegaly appreciated. RIJ  Cor: PMI nondisplaced. Irregular rate & rhythm. No rubs, gallops or murmurs. Lungs: Clear Abdomen: Soft, nontender, nondistended. No hepatosplenomegaly. No bruits or masses. Good bowel sounds. Extremities: No cyanosis, clubbing, rash, edema Neuro: Alert & orientedx3, cranial nerves grossly intact. moves all 4 extremities w/o difficulty. Affect pleasant GU: Foley with pink tinged urine.    Telemetry   A fib 80-90s   EKG    N/A  Labs    CBC Recent Labs    06/14/20 1330 06/15/20 0118 06/16/20 0251  WBC 8.0 12.9* 9.9  NEUTROABS 6.4  --   --   HGB 10.8* 11.7* 10.2*  HCT 36.6* 40.5 33.6*  MCV 81.9 83.2 79.1*  PLT 329 394 102   Basic Metabolic Panel Recent Labs    06/14/20 1330 06/14/20 1543 06/15/20 1300 06/16/20 0251  NA 131*   < > 130* 131*  K 5.2*   < > 3.8  2.9*  CL 95*   < > 92* 87*  CO2 19*   < > 21* 30  GLUCOSE 186*   < > 186* 117*  BUN 68*   < > 87* 88*  CREATININE 2.64*   < > 2.95* 2.71*  CALCIUM 8.7*   < > 8.3* 8.4*  MG 2.1  --   --   --    < > = values in this interval not displayed.   Liver Function Tests Recent Labs    06/15/20 1300 06/16/20 0251  AST 3,005* 3,132*  ALT 1,397* 1,864*  ALKPHOS 76 96  BILITOT 1.9* 2.3*  PROT 6.1* 6.1*  ALBUMIN 2.8* 2.8*   No results for input(s): LIPASE, AMYLASE in the last 72 hours. Cardiac Enzymes No results for input(s): CKTOTAL, CKMB, CKMBINDEX, TROPONINI in the last 72 hours.  BNP: BNP (last 3 results) Recent Labs    05/26/20 1429 06/15/20 0729  BNP 1,609.8* 2,424.4*    ProBNP (last 3 results) Recent Labs    07/11/19 1107 04/23/20 1446  PROBNP 445 2,488*     D-Dimer No results for input(s): DDIMER in the last 72 hours. Hemoglobin A1C No results for input(s): HGBA1C in the last 72 hours. Fasting Lipid Panel No results for input(s): CHOL, HDL, LDLCALC, TRIG, CHOLHDL,  LDLDIRECT in the last 72 hours. Thyroid Function Tests Recent Labs    06/14/20 2030  TSH 7.588*    Other results:   Imaging    DG CHEST PORT 1 VIEW  Result Date: 06/15/2020 CLINICAL DATA:  Central line EXAM: PORTABLE CHEST 1 VIEW COMPARISON:  June 14, 2020 FINDINGS: The cardiomediastinal silhouette is unchanged and enlarged in contour.RIGHT IJ CVC tip terminates over the region of the inferior SVC. Atherosclerotic calcifications of the aorta. No pleural effusion. No pneumothorax. Unchanged LEFT retrocardiac opacity, likely atelectasis no acute pleuroparenchymal abnormality. Visualized abdomen is unremarkable. Multilevel degenerative changes of the thoracic spine. IMPRESSION: RIGHT IJ CVC tip terminates over the region of the inferior SVC. Electronically Signed   By: Valentino Saxon MD   On: 06/15/2020 18:39   ECHOCARDIOGRAM LIMITED  Result Date: 06/15/2020    ECHOCARDIOGRAM LIMITED REPORT    Patient Name:   Wesley Winters Date of Exam: 06/15/2020 Medical Rec #:  [id number]    Height:       68.0 in Accession #:    [id number]   Weight:       209.0 lb Date of Birth:  Nov 19, 1940    BSA:          2.082 m Patient Age:    80 years     BP:           119/71 mmHg Patient Gender: M            HR:           108 bpm. Exam Location:  Inpatient Procedure: Limited Echo, Cardiac Doppler, Color Doppler and Intracardiac            Opacification Agent Indications:    Shock  History:        Patient has prior history of Echocardiogram examinations, most                 recent 05/05/2020. CAD, Arrythmias:Atrial Fibrillation; Risk                 Factors:Hypertension, Dyslipidemia, Sleep Apnea and Diabetes.                 S/P PCI.  Sonographer:    Clayton Lefort RDCS (AE) Referring Phys: [id number] [place]  1. Left ventricular ejection fraction, by estimation, is <20%. The left ventricle has severely decreased function. The left ventricle demonstrates global hypokinesis. The left ventricular internal cavity size was severely dilated. Left ventricular diastolic function could not be evaluated.  2. Right ventricular systolic function is severely reduced. The right ventricular size is moderately enlarged. There is moderately elevated pulmonary artery systolic pressure. The estimated right ventricular systolic pressure is 10.2 mmHg.  3. Right atrial size was severely dilated.  4. The mitral valve is degenerative. Moderate mitral valve regurgitation. No evidence of mitral stenosis.  5. The aortic valve is tricuspid. Aortic valve regurgitation is not visualized. Mild aortic valve sclerosis is present, with no evidence of aortic valve stenosis.  6. The inferior vena cava is dilated in size with <50% respiratory variability, suggesting right atrial pressure of 15 mmHg. FINDINGS  Left Ventricle: Left ventricular ejection fraction, by estimation, is <20%. The left ventricle has severely decreased function. The left ventricle  demonstrates global hypokinesis. Definity contrast agent was given IV to delineate the left ventricular endocardial borders. The left ventricular internal cavity size was severely dilated. There is no left ventricular hypertrophy. Left ventricular diastolic function could not be evaluated. Left ventricular  diastolic function could not be evaluated due to atrial fibrillation. Right Ventricle: The right ventricular size is moderately enlarged. No increase in right ventricular wall thickness. Right ventricular systolic function is severely reduced. There is moderately elevated pulmonary artery systolic pressure. The tricuspid regurgitant velocity is 3.31 m/s, and with an assumed right atrial pressure of 15 mmHg, the estimated right ventricular systolic pressure is 61.4 mmHg. Left Atrium: Left atrial size was normal in size. Right Atrium: Right atrial size was severely dilated. Pericardium: There is no evidence of pericardial effusion. Mitral Valve: The mitral valve is degenerative in appearance. There is mild thickening of the mitral valve leaflet(s). There is mild calcification of the mitral valve leaflet(s). Moderate mitral valve regurgitation. No evidence of mitral valve stenosis. Tricuspid Valve: The tricuspid valve is normal in structure. Tricuspid valve regurgitation is mild . No evidence of tricuspid stenosis. Aortic Valve: The aortic valve is tricuspid. Aortic valve regurgitation is not visualized. Mild aortic valve sclerosis is present, with no evidence of aortic valve stenosis. Pulmonic Valve: The pulmonic valve was normal in structure. Pulmonic valve regurgitation is not visualized. No evidence of pulmonic stenosis. Aorta: The aortic root is normal in size and structure. Venous: The inferior vena cava is dilated in size with less than 50% respiratory variability, suggesting right atrial pressure of 15 mmHg. IAS/Shunts: No atrial level shunt detected by color flow Doppler. LEFT VENTRICLE PLAX 2D LVIDd:          6.40 cm LVIDs:         5.80 cm LV PW:         0.90 cm LV IVS:        0.80 cm LVOT diam:     2.10 cm LVOT Area:     3.46 cm  IVC IVC diam: 2.70 cm LEFT ATRIUM         Index LA diam:    3.50 cm 1.68 cm/m   AORTA Ao Root diam: 3.40 cm Ao Asc diam:  3.40 cm MR Peak grad: 66.9 mmHg   TRICUSPID VALVE MR Mean grad: 45.0 mmHg   TR Peak grad:   43.8 mmHg MR Vmax:      409.00 cm/s TR Vmax:        331.00 cm/s MR Vmean:     317.0 cm/s                           SHUNTS                           Systemic Diam: 2.10 cm Fransico Him MD Electronically signed by Fransico Him MD Signature Date/Time: 06/15/2020/11:02:38 AM    Final      Medications:     Scheduled Medications:  Chlorhexidine Gluconate Cloth  6 each Topical Q0600   insulin aspart  0-9 Units Subcutaneous TID WC   insulin glargine  10 Units Subcutaneous QHS   potassium chloride  40 mEq Oral Q4H   vitamin B-12  1,000 mcg Oral QHS    Infusions:  sodium chloride 10 mL/hr at 06/15/20 0600   amiodarone 30 mg/hr (06/16/20 0700)   heparin 1,100 Units/hr (06/16/20 0700)   milrinone 0.25 mcg/kg/min (06/16/20 0700)   norepinephrine (LEVOPHED) Adult infusion      PRN Medications:   Assessment/Plan  Acute systolic HF -> 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 NE now off. Check Lactic Acid  - watch closely for need for mechanical support - doubt ischemic as hstrop low despite profound hemodynamic support but given h/p CAD and multiple CRFs will need ischemic eval once more stable and renal function improves -6/12 CO-OX 43.5%.started on milrinone 0.25 mcg -->todays CO-OX 40%. Increase milrinone to 0.375 mcg. Repeat CO-OX now and at 1200  - CVP 7. Hold diuretics.  - BP stable. Would hold off on adding hydralazine/imdur with AKI assess if we get some  improvement with inotrope support.    2. CAD - s/p previous LAD stenting - doubt ischemia is major factor in HF given low hsTrop - plan as above - continue ASA - hold statin with shock liver   3. AF with RVR - plan as above.  - Rate controlled. Continue amio drip.  - Eliquis stopped 6/12 and switched to heparin.  - Continue heparin drip. Watch with some hematuria.  - DC-CV this admission   4. AKI - due to ATN and shock - improving with hemodynamic support - Creatinine trending down 2.95>2.7    5. Shock liver - due to shock - supportive care   6. DM2 - SSI    7. Hypokalemia  K 2.9 --> Give 80 meq now. Repeat BMET at 1100   8. Code status - he was made DNR/DNI but was pretty functional prior to developing HF from AF. I think he has a likel reversible cause for his deterioration. - Dr Haroldine Laws discussed with him and will be Full Code for now.   Consult PT.    Length of Stay: 2  Darrick Grinder, NP  06/16/2020, 7:31 AM  Advanced Heart Failure Team Pager [phone] (M-F; 7a - 5p)  Please contact [facility] for night-coverage after hours (5p -7a ) and weekends on [email or web address]  Agree with above.   Milrinone stated yesterday for cardiogenic shock with co-ox 43%. Now on milrinone 0.25. Co-ox 46%. Renal function slightly improved. LFTS plateauing. Denies CP or SOB. CVP 7. Remains in AF. Rate now controlled on IV amio. Continue heparin   General:  Elderly male sitting up in bed  No resp difficulty HEENT: normal Neck: supple. RIJ TLC Carotids 2+ bilat; no bruits. No lymphadenopathy or thryomegaly appreciated. Cor: PMI nondisplaced. Irregular rate & rhythm. No rubs, gallops or murmurs. Lungs: clear Abdomen: soft, nontender, nondistended. No hepatosplenomegaly. No bruits or masses. Good bowel sounds. Extremities: no cyanosis, clubbing, rash, edema Neuro: alert & orientedx3, cranial nerves grossly intact. moves all 4 extremities w/o difficulty. Affect pleasant  Somewhat  improved but co-ox still very low. Agree with increasing milrinone to 0.375 and repeating co-ox. Will likely need low-dose NE. He prefers to avoid mechanical support if he can.  Supp K. Once more stable will need DC-CV. (Has not missed any doses of AC).   CRITICAL CARE Performed by: Glori Bickers  Total critical care time: 45 minutes  Critical care time was exclusive of separately billable procedures and treating other patients.  Critical care was necessary to treat or prevent imminent or life-threatening deterioration.  Critical care was time spent personally by me (independent of midlevel providers or residents) on the following activities: development of treatment plan with patient and/or surrogate as well as nursing, discussions with consultants, evaluation of patient's response to treatment, examination of patient, obtaining history from patient or surrogate, ordering and performing treatments and interventions,  ordering and review of laboratory studies, ordering and review of radiographic studies, pulse oximetry and re-evaluation of patient's condition.  Glori Bickers, MD  8:17 AM

## 2020-06-16 NOTE — Progress Notes (Signed)
[facility] for heparin Indication: atrial fibrillation  Labs: Recent Labs    06/14/20 1330 06/14/20 2202 06/15/20 0118 06/15/20 1300 06/16/20 0251  HGB 10.8*  --  11.7*  --  10.2*  HCT 36.6*  --  40.5  --  33.6*  PLT 329  --  394  --  317  APTT  --   --   --   --  121*  HEPARINUNFRC  --   --   --   --  >1.10*  CREATININE 2.64*  --  3.21* 2.95* 2.71*  TROPONINIHS  --  65* 80*  --   --       Assessment: 80 yo M on apixaban PTA for afib now holding for cardiogenic shock. Last dose of apixaban 6/12 at 10am. Pharmacy consulted for heparin.  aPTT 121 sec.    Goal of Therapy:  Heparin level 0.3-0.7 units/ml aPTT 66-102 seconds Monitor platelets by anticoagulation protocol: Yes   Plan:  Decrease heparin 1100 units/hr F/u aPTT in 6-8 hours Monitor daily aPTT, HL, CBC/plt Monitor for signs/symptoms of bleeding  F/u restart apixaban after procedures  Thanks for allowing pharmacy to be a part of this patient's care.  Excell Seltzer, PharmD Clinical Pharmacist

## 2020-06-16 NOTE — Progress Notes (Signed)
Wesley Winters for heparin Indication: atrial fibrillation  Labs: Recent Labs    06/14/20 1330 06/14/20 2202 06/15/20 0118 06/15/20 1300 06/16/20 0251 06/16/20 1136  HGB 10.8*  --  11.7*  --  10.2*  --   HCT 36.6*  --  40.5  --  33.6*  --   PLT 329  --  394  --  317  --   APTT  --   --   --   --  121* 147*  HEPARINUNFRC  --   --   --   --  >1.10*  --   CREATININE 2.64*  --  3.21* 2.95* 2.71* 2.75*  TROPONINIHS  --  65* 80*  --   --   --       Assessment: 80 yo M on apixaban PTA for afib now holding for cardiogenic shock. Last dose of apixaban 6/12 at 10am. Pharmacy consulted for heparin.   aPTT came back supratherapeutic at 147 - drawn from a different lumen of central line. Given hematuria, will switch heparin to run peripheral and redraw heparin infusion in 30 minutes from central line.   After switch, aPTT came back higher at 159. Still has hematuria. No other s/sx of bleeding.   Goal of Therapy:  Heparin level 0.3-0.7 units/ml aPTT 66-102 seconds Monitor platelets by anticoagulation protocol: Yes   Plan:  Hold heparin infusion for 1 hour Reduce heparin infusion to 950 units/hr F/u aPTT in 8 hours Monitor daily aPTT, HL, CBC/plt Monitor for signs/symptoms of bleeding  F/u restart apixaban after procedures    Thanks for allowing pharmacy to be a part of this patient's care.  Wesley Winters, PharmD, [place] Clinical Pharmacist  Phone: [phone] 06/16/2020 1:31 PM  Please check AMION for all [place] phone numbers After 10:00 PM, call [place] [phone]

## 2020-06-16 NOTE — Consult Note (Signed)
Consultation Note Date: 06/16/2020   Patient Name: Wesley Winters  DOB: June 10, 1940  MRN: [id number]  Age / Sex: 80 y.o., male  PCP: [name], MD Referring Physician: Jolaine Artist, MD  Reason for Consultation: Establishing goals of care and Psychosocial/spiritual support  HPI/Patient Profile: 80 y.o. male   admitted on 06/14/2020 with history of cardiomyopathy with EF of 25% likely tachycardia related with history of A. fib, CAD status post stenting, diabetes mellitus was recently admitted for CHF and also underwent cardioversion discharged on Jun 03, 2020 about 10 days ago has been having poor appetite and weakness, and three reported falls per his wife.   Complaining of the weakness, patient was brought to the ER.  Patient reports poor appetite and nausea,  denies any abdominal pain.     ED Course: In the ER patient was found to have intermittent episodes of A. fib with RVR labs are significant for creatinine worsening from 1.2 about 4 days ago and it is around 2.6.  LFTs are elevated at AST of 110 ALT of 87 total bilirubin 1.2.  CBC is around baseline.  Patient was given to 50 cc normal saline bolus given that patient had acute renal failure.  Abdomen appears benign.  COVID test was negative.  Patient underwent CT head and CT chest abdomen pelvis which does not show any acute.  Patient and family face treatment option decisions, advanced directive decisions and anticipatory care needs.    Clinical Assessment and Goals of Care:   This NP [name] reviewed medical records, received report from team, assessed the patient and then meet at the patient's bedside along with his wife and daughter to discuss diagnosis, prognosis, GOC, EOL wishes disposition and options.   Concept of Palliative Care was introduced as specialized medical care for people and their families living with serious illness.  If  focuses on providing relief from the symptoms and stress of a serious illness.  The goal is to improve quality of life for both the patient and the family.  Values and goals of care important to patient and family were attempted to be elicited.   Created space and opportunity for patient  and family to explore thoughts and feelings regarding current medical situation.  All understand the seriousness of the patient's current medical situation.  Patient's wife understandably is concerned with long-term prognosis, "I do not want him laying in a nursing home".   She speaks to an experience she had with her own father in a skilled nursing facility.   Patient verbalizes his agreement and not wanting long term skilled nursing facility care.  Patient and his wife have limited support in the home and patient is having increasing personal care needs.  Only daughter lives in [place]   A  discussion was had today regarding advanced directives.  Concepts specific to code status, artifical feeding and hydration, continued IV antibiotics and rehospitalization was had.  The difference between a aggressive medical intervention path  and a palliative comfort care path for  this patient at this time was had.    At this time patient and his family are hopeful for improvement and ultimately to return back home.  All understand the seriousness of his current medical situation.  Patient is open to all offered and available medical interventions to prolong life.     Questions and concerns addressed.  Patient/family  encouraged to call with questions or concerns.     PMT will continue to support holistically.  Plan is to meet tomorrow morning at 1030 for continued goals of care conversations.           HPOA/wife and daughter.  Family report that they have extensive documents to include H POA and living well and will bring to the hospital for scanning.      SUMMARY OF RECOMMENDATIONS    Code Status/Advance Care  Planning: Full code   Symptom Management:   Per attending  Palliative Prophylaxis:  Delirium Protocol, Frequent Pain Assessment, and Oral Care  Additional Recommendations (Limitations, Scope, Preferences): Full Comfort Care  Psycho-social/Spiritual:  Desire for further Chaplaincy support:no- declined they have their own chaplain support   Prognosis:  Unable to determine  Discharge Planning: To Be Determined      Primary Diagnoses: Present on Admission:  ARF (acute renal failure) ([place])  Persistent atrial fibrillation ([place])  CAD in native artery   I have reviewed the medical record, interviewed the patient and family, and examined the patient. The following aspects are pertinent.  Past Medical History:  Diagnosis Date   Balanitis    Bladder calculus    Coronary artery disease    stents x3 by Dr Tamala Julian 1998   Depression    Diabetes mellitus without complication ([place])    GERD (gastroesophageal reflux disease)    Hypercholesterolemia    Hypertension    Hypomagnesemia 06/03/2020   Phimosis    Prostate cancer ([facility])    Sleep apnea    wears CPAP nightly   Social History   Socioeconomic History   Marital status: Married    Spouse name: Not on file   Number of children: Not on file   Years of education: Not on file   Highest education level: Not on file  Occupational History   Not on file  Tobacco Use   Smoking status: Never   Smokeless tobacco: Never  Substance and Sexual Activity   Alcohol use: No   Drug use: No   Sexual activity: Yes  Other Topics Concern   Not on file  Social History Narrative   Not on file   Social Determinants of Health   Financial Resource Strain: Low Risk    Difficulty of Paying Living Expenses: Not very hard  Food Insecurity: No Food Insecurity   Worried About Running Out of Food in the Last Year: Never true   Ran Out of Food in the Last Year: Never true  Transportation Needs: No Transportation Needs   Lack of  Transportation (Medical): No   Lack of Transportation (Non-Medical): No  Physical Activity: Not on file  Stress: Not on file  Social Connections: Not on file   Family History  Problem Relation Age of Onset   Cancer Brother        prostate   Heart disease Brother    Heart disease Mother    Arrhythmia Mother    Arrhythmia Brother    Scheduled Meds:  Chlorhexidine Gluconate Cloth  6 each Topical Q0600   insulin aspart  0-9 Units Subcutaneous TID WC  insulin glargine  10 Units Subcutaneous QHS   vitamin B-12  1,000 mcg Oral QHS   Continuous Infusions:  sodium chloride 10 mL/hr at 06/15/20 0600   amiodarone 30 mg/hr (06/16/20 1300)   heparin 1,100 Units/hr (06/16/20 1300)   milrinone 0.375 mcg/kg/min (06/16/20 1300)   norepinephrine (LEVOPHED) Adult infusion     potassium chloride 10 mEq (06/16/20 1407)   PRN Meds:.ondansetron (ZOFRAN) IV Medications Prior to Admission:  Prior to Admission medications   Medication Sig Start Date End Date Taking? Authorizing Provider  acetaminophen (TYLENOL) 500 MG tablet Take 500-1,000 mg by mouth 3 (three) times daily as needed for moderate pain, mild pain or headache.   Yes [provider]  amiodarone (PACERONE) 200 MG tablet Take 2 tablets (400 mg total) by mouth 2 (two) times daily. 06/13/20  Yes Camnitz, Ocie Doyne, MD  apixaban (ELIQUIS) 5 MG TABS tablet Take 1 tablet (5 mg total) by mouth 2 (two) times daily. 04/23/20  Yes Belva Crome, MD  atorvastatin (LIPITOR) 40 MG tablet Take 40 mg by mouth daily.   Yes [provider]  azelastine (ASTELIN) 0.1 % nasal spray Place 2 sprays into both nostrils 2 (two) times daily as needed for allergies. Use in each nostril as directed   Yes [provider]  B-D UF III MINI PEN NEEDLES 31G X 5 MM MISC 1 each by Other route every evening. 10/01/19  Yes [provider]  carvedilol (COREG) 3.125 MG tablet Take 1 tablet (3.125 mg total) by mouth 2 (two) times daily with a  meal. 05/12/20  Yes Belva Crome, MD  cetirizine (ZYRTEC) 10 MG tablet Take 10 mg by mouth in the morning. 03/28/20  Yes [provider]  cholecalciferol (VITAMIN D3) 25 MCG (1000 UT) tablet Take 1,000 Units by mouth at bedtime.   Yes [provider]  fluticasone (FLONASE) 50 MCG/ACT nasal spray Place 2 sprays into both nostrils daily as needed for allergies.    Yes [provider]  furosemide (LASIX) 20 MG tablet Take 1 tablet (20 mg total) by mouth daily. 06/09/20  Yes Belva Crome, MD  metFORMIN (GLUCOPHAGE-XR) 500 MG 24 hr tablet Take 1,000 mg by mouth 2 (two) times daily.  11/11/14  Yes [provider]  mupirocin ointment (BACTROBAN) 2 % APPLY TO RIGHT GREAT TOE AND RIGHT 3RD TOE ONCE DAILY Patient taking differently: Apply 1 application topically See admin instructions. Apply to right great toe and right 3rd toe once daily 11/14/19  Yes [place], Stephani Police, DPM  ONETOUCH ULTRA test strip 1 each 3 (three) times daily. 08/15/19  Yes [provider]  PRESCRIPTION MEDICATION CPAP- At bedtime and during any naps   Yes [provider]  TOUJEO MAX SOLOSTAR 300 UNIT/ML Solostar Pen Inject 25 Units into the skin at bedtime.   Yes [provider]  vitamin B-12 (CYANOCOBALAMIN) 1000 MCG tablet Take 1,000 mcg by mouth at bedtime.   Yes [provider]   Allergies  Allergen Reactions   Metformin Hcl Diarrhea   Other Other (See Comments) and Cough    Pollen- Chest congestion, also   Amoxicillin Hives, Nausea Only and Rash   Amoxicillin-Pot Clavulanate Rash   Empagliflozin Nausea Only, Anxiety and Other (See Comments)    Made the patient jittery and nauseous   Januvia [Sitagliptin] Anxiety   Review of Systems  Constitutional:  Positive for fatigue.  Neurological:  Positive for weakness.   Physical Exam Constitutional:  Appearance: He is well-developed. He is ill-appearing.  Cardiovascular:     Rate and Rhythm: Normal  rate.  Pulmonary:     Breath sounds: Normal breath sounds.  Skin:    General: Skin is warm and dry.  Neurological:     Mental Status: He is alert and oriented to person, place, and time.    Vital Signs: BP 123/88   Pulse 81   Temp 97.7 F (36.5 C)   Resp 15   Ht 5\' 8"  (1.727 m)   Wt 91.4 kg   SpO2 99%   BMI 30.64 kg/m  Pain Scale: 0-10   Pain Score: 0-No pain   SpO2: SpO2: 99 % O2 Device:SpO2: 99 % O2 Flow Rate: .O2 Flow Rate (L/min): 2 L/min  IO: Intake/output summary:  Intake/Output Summary (Last 24 hours) at 06/16/2020 1434 Last data filed at 06/16/2020 1400 Gross per 24 hour  Intake 724.28 ml  Output 3955 ml  Net -3230.72 ml    LBM: Last BM Date:  (pta 6/10) Baseline Weight: Weight: 93.6 kg Most recent weight: Weight: 91.4 kg     Palliative Assessment/Data:   30 %    Discussed with bedside RN  Time In: 1200 Time Out: 1315 Time Total: 75 minutes Greater than 50%  of this time was spent counseling and coordinating care related to the above assessment and plan.  Signed by: [name], NP   Please contact Palliative Medicine Team phone at [phone] for questions and concerns.  For individual provider: See Shea Evans

## 2020-06-17 ENCOUNTER — Ambulatory Visit: Payer: Medicare Other | Admitting: Physician Assistant

## 2020-06-17 DIAGNOSIS — Z7901 Long term (current) use of anticoagulants: Secondary | ICD-10-CM

## 2020-06-17 DIAGNOSIS — R0602 Shortness of breath: Secondary | ICD-10-CM | POA: Diagnosis not present

## 2020-06-17 DIAGNOSIS — R531 Weakness: Secondary | ICD-10-CM | POA: Diagnosis not present

## 2020-06-17 DIAGNOSIS — E0810 Diabetes mellitus due to underlying condition with ketoacidosis without coma: Secondary | ICD-10-CM

## 2020-06-17 DIAGNOSIS — N179 Acute kidney failure, unspecified: Secondary | ICD-10-CM

## 2020-06-17 DIAGNOSIS — I509 Heart failure, unspecified: Secondary | ICD-10-CM | POA: Diagnosis not present

## 2020-06-17 DIAGNOSIS — E11610 Type 2 diabetes mellitus with diabetic neuropathic arthropathy: Secondary | ICD-10-CM

## 2020-06-17 DIAGNOSIS — I4819 Other persistent atrial fibrillation: Secondary | ICD-10-CM

## 2020-06-17 DIAGNOSIS — I2510 Atherosclerotic heart disease of native coronary artery without angina pectoris: Secondary | ICD-10-CM

## 2020-06-17 DIAGNOSIS — L97519 Non-pressure chronic ulcer of other part of right foot with unspecified severity: Secondary | ICD-10-CM

## 2020-06-17 DIAGNOSIS — L97529 Non-pressure chronic ulcer of other part of left foot with unspecified severity: Secondary | ICD-10-CM

## 2020-06-17 LAB — RETICULOCYTES
Immature Retic Fract: 33.6 % — ABNORMAL HIGH (ref 2.3–15.9)
RBC.: 4.03 MIL/uL — ABNORMAL LOW (ref 4.22–5.81)
Retic Count, Absolute: 114.9 10*3/uL (ref 19.0–186.0)
Retic Ct Pct: 2.9 % (ref 0.4–3.1)

## 2020-06-17 LAB — VITAMIN B12: Vitamin B-12: 7060 pg/mL — ABNORMAL HIGH (ref 180–914)

## 2020-06-17 LAB — COOXEMETRY PANEL
Carboxyhemoglobin: 1.3 % (ref 0.5–1.5)
Methemoglobin: 1 % (ref 0.0–1.5)
O2 Saturation: 57 %
Total hemoglobin: 10 g/dL — ABNORMAL LOW (ref 12.0–16.0)

## 2020-06-17 LAB — GLUCOSE, CAPILLARY
Glucose-Capillary: 150 mg/dL — ABNORMAL HIGH (ref 70–99)
Glucose-Capillary: 280 mg/dL — ABNORMAL HIGH (ref 70–99)
Glucose-Capillary: 165 mg/dL — ABNORMAL HIGH (ref 70–99)
Glucose-Capillary: 198 mg/dL — ABNORMAL HIGH (ref 70–99)

## 2020-06-17 LAB — CBC
HCT: 31.6 % — ABNORMAL LOW (ref 39.0–52.0)
HCT: 31 % — ABNORMAL LOW (ref 39.0–52.0)
Hemoglobin: 9.6 g/dL — ABNORMAL LOW (ref 13.0–17.0)
Hemoglobin: 9.2 g/dL — ABNORMAL LOW (ref 13.0–17.0)
MCH: 23.8 pg — ABNORMAL LOW (ref 26.0–34.0)
MCH: 23.4 pg — ABNORMAL LOW (ref 26.0–34.0)
MCHC: 30.4 g/dL (ref 30.0–36.0)
MCHC: 29.7 g/dL — ABNORMAL LOW (ref 30.0–36.0)
MCV: 78.2 fL — ABNORMAL LOW (ref 80.0–100.0)
MCV: 78.9 fL — ABNORMAL LOW (ref 80.0–100.0)
Platelets: 269 10*3/uL (ref 150–400)
Platelets: 274 10*3/uL (ref 150–400)
RBC: 4.04 MIL/uL — ABNORMAL LOW (ref 4.22–5.81)
RBC: 3.93 MIL/uL — ABNORMAL LOW (ref 4.22–5.81)
RDW: 18.5 % — ABNORMAL HIGH (ref 11.5–15.5)
RDW: 18.7 % — ABNORMAL HIGH (ref 11.5–15.5)
WBC: 12.4 10*3/uL — ABNORMAL HIGH (ref 4.0–10.5)
WBC: 12 10*3/uL — ABNORMAL HIGH (ref 4.0–10.5)
nRBC: 0 % (ref 0.0–0.2)
nRBC: 0.2 % (ref 0.0–0.2)

## 2020-06-17 LAB — MAGNESIUM: Magnesium: 1.8 mg/dL (ref 1.7–2.4)

## 2020-06-17 LAB — BASIC METABOLIC PANEL
Anion gap: 14 (ref 5–15)
Anion gap: 10 (ref 5–15)
BUN: 83 mg/dL — ABNORMAL HIGH (ref 8–23)
BUN: 79 mg/dL — ABNORMAL HIGH (ref 8–23)
CO2: 28 mmol/L (ref 22–32)
CO2: 30 mmol/L (ref 22–32)
Calcium: 7.9 mg/dL — ABNORMAL LOW (ref 8.9–10.3)
Calcium: 7.9 mg/dL — ABNORMAL LOW (ref 8.9–10.3)
Chloride: 84 mmol/L — ABNORMAL LOW (ref 98–111)
Chloride: 88 mmol/L — ABNORMAL LOW (ref 98–111)
Creatinine, Ser: 2.56 mg/dL — ABNORMAL HIGH (ref 0.61–1.24)
Creatinine, Ser: 2.43 mg/dL — ABNORMAL HIGH (ref 0.61–1.24)
GFR, Estimated: 24 mL/min — ABNORMAL LOW (ref >60)
GFR, Estimated: 26 mL/min — ABNORMAL LOW (ref >60)
Glucose, Bld: 205 mg/dL — ABNORMAL HIGH (ref 70–99)
Glucose, Bld: 209 mg/dL — ABNORMAL HIGH (ref 70–99)
Potassium: 3.3 mmol/L — ABNORMAL LOW (ref 3.5–5.1)
Potassium: 3.6 mmol/L (ref 3.5–5.1)
Sodium: 126 mmol/L — ABNORMAL LOW (ref 135–145)
Sodium: 128 mmol/L — ABNORMAL LOW (ref 135–145)

## 2020-06-17 LAB — COMPREHENSIVE METABOLIC PANEL
ALT: 1453 U/L — ABNORMAL HIGH (ref 0–44)
AST: 1322 U/L — ABNORMAL HIGH (ref 15–41)
Albumin: 2.5 g/dL — ABNORMAL LOW (ref 3.5–5.0)
Alkaline Phosphatase: 98 U/L (ref 38–126)
Anion gap: 12 (ref 5–15)
BUN: 88 mg/dL — ABNORMAL HIGH (ref 8–23)
CO2: 28 mmol/L (ref 22–32)
Calcium: 7.9 mg/dL — ABNORMAL LOW (ref 8.9–10.3)
Chloride: 86 mmol/L — ABNORMAL LOW (ref 98–111)
Creatinine, Ser: 2.98 mg/dL — ABNORMAL HIGH (ref 0.61–1.24)
GFR, Estimated: 20 mL/min — ABNORMAL LOW (ref >60)
Glucose, Bld: 258 mg/dL — ABNORMAL HIGH (ref 70–99)
Potassium: 3.4 mmol/L — ABNORMAL LOW (ref 3.5–5.1)
Sodium: 126 mmol/L — ABNORMAL LOW (ref 135–145)
Total Bilirubin: 2 mg/dL — ABNORMAL HIGH (ref 0.3–1.2)
Total Protein: 5.8 g/dL — ABNORMAL LOW (ref 6.5–8.1)

## 2020-06-17 LAB — IRON AND TIBC
Iron: 21 ug/dL — ABNORMAL LOW (ref 45–182)
Saturation Ratios: 6 % — ABNORMAL LOW (ref 17.9–39.5)
TIBC: 337 ug/dL (ref 250–450)
UIBC: 316 ug/dL

## 2020-06-17 LAB — HEPARIN LEVEL (UNFRACTIONATED): Heparin Unfractionated: >1.1 IU/mL — ABNORMAL HIGH (ref 0.30–0.70)

## 2020-06-17 LAB — FOLATE: Folate: 15.9 ng/mL (ref >5.9)

## 2020-06-17 LAB — APTT
aPTT: 107 seconds — ABNORMAL HIGH (ref 24–36)
aPTT: 106 seconds — ABNORMAL HIGH (ref 24–36)

## 2020-06-17 LAB — FERRITIN: Ferritin: 105 ng/mL (ref 24–336)

## 2020-06-17 MED ORDER — MUPIROCIN 2 % EX OINT
TOPICAL_OINTMENT | Freq: Two times a day (BID) | CUTANEOUS | Status: DC
  Administered 2020-06-17 – 2020-06-18 (×2): 1 via TOPICAL
  Filled 2020-06-17 (×2): qty 22

## 2020-06-17 MED ORDER — MAGNESIUM SULFATE 2 GM/50ML IV SOLN
2.0000 g | Freq: Once | INTRAVENOUS | Status: AC
  Administered 2020-06-17: 2 g via INTRAVENOUS
  Filled 2020-06-17: qty 50

## 2020-06-17 MED ORDER — POTASSIUM CHLORIDE CRYS ER 20 MEQ PO TBCR
40.0000 meq | EXTENDED_RELEASE_TABLET | ORAL | Status: AC
  Administered 2020-06-17 (×2): 40 meq via ORAL
  Filled 2020-06-17 (×2): qty 2

## 2020-06-17 NOTE — Progress Notes (Signed)
[facility] for heparin Indication: atrial fibrillation  Labs: Recent Labs     0000 06/14/20 2202 06/15/20 0118 06/15/20 1300 06/16/20 0251 06/16/20 1136 06/16/20 1356 06/17/20 0338 06/17/20 0547 06/17/20 0734 06/17/20 1430  HGB   < >  --  11.7*  --  10.2*  --   --   --  9.6* 9.2*  --   HCT   < >  --  40.5  --  33.6*  --   --   --  31.6* 31.0*  --   PLT   < >  --  394  --  317  --   --   --  269 274  --   APTT  --   --   --    < > 121* 147* 159* 107*  --   --  106*  HEPARINUNFRC  --   --   --   --  >1.10*  --   --  >1.10*  --   --   --   CREATININE  --   --  3.21*   < > 2.71* 2.75*  --   --   --  2.98*  --   TROPONINIHS  --  65* 80*  --   --   --   --   --   --   --   --    < > = values in this interval not displayed.      Assessment: 80 yo M on apixaban PTA for afib now holding for cardiogenic shock. Last dose of apixaban 6/12 at 10am. Pharmacy consulted for heparin.   aPTT came back slightly supratherapeutic at 106 - drawn central line, heparin running peripheral. Hgb 9.2, plt 274. No s/sx of bleeding outside of gross hematuria or infusion issues.   Goal of Therapy:  Heparin level 0.3-0.7 units/ml aPTT 66-102 seconds Monitor platelets by anticoagulation protocol: Yes   Plan:  Reduce heparin infusion to 700 units/hr  F/u aPTT in 8 hours Monitor daily aPTT, HL, CBC/plt Monitor for signs/symptoms of bleeding  F/u restart apixaban after procedures    Thanks for allowing pharmacy to be a part of this patient's care.  Antonietta Jewel, PharmD, [facility] Clinical Pharmacist  Phone: [phone] 06/17/2020 4:28 PM  Please check AMION for all [facility] phone numbers After 10:00 PM, call [place] [phone]

## 2020-06-17 NOTE — Progress Notes (Addendum)
Advanced Heart Failure Rounding Note  PCP-Cardiologist: Sinclair Grooms, MD   Subjective:   Cardiogenic shock. New onset AF.   6/12 CO-OX 43% --->Started on milrinone 0.25 mcg.  6/13 Milrinone increased to 0.375. Co-ox 57% today.   In and out of AF, HR controlled 80s. Remains on amio gtt 30 + heparin gtt.   Diuretics held yesterday for low CVP, 6-7 today.   Initial labs this morning hemolyzed. Repeat pending.   OOB, sitting up in chair. Still feels weak/tired. No dyspnea.    Objective:   Weight Range: 94.1 kg Body mass index is 31.54 kg/m.   Vital Signs:   Temp:  [97.6 F (36.4 C)-98.5 F (36.9 C)] 97.6 F (36.4 C) (06/14 0734) Pulse Rate:  [75-95] 82 (06/14 0600) Resp:  [14-27] 27 (06/14 0600) BP: (111-137)/(54-88) 137/68 (06/14 0600) SpO2:  [98 %-100 %] 100 % (06/14 0600) Weight:  [94.1 kg] 94.1 kg (06/14 0600) Last BM Date:  (pta 6/10)  Weight change: Filed Weights   06/15/20 0041 06/16/20 0500 06/17/20 0600  Weight: 94.8 kg 91.4 kg 94.1 kg    Intake/Output:   Intake/Output Summary (Last 24 hours) at 06/17/2020 0755 Last data filed at 06/17/2020 0100 Gross per 24 hour  Intake 778.8 ml  Output 1495 ml  Net -716.2 ml      Physical Exam   CVP 6-7  General:  Well appearing elderly WM sitting up in chair. No respiratory difficulty HEENT: normal Neck: supple. JVD 6 cm. Carotids 2+ bilat; no bruits. No lymphadenopathy or thyromegaly appreciated. Cor: PMI nondisplaced. Irregularly irregular rhythm. No rubs, gallops or murmurs. Lungs: clear Abdomen: soft, nontender, nondistended. No hepatosplenomegaly. No bruits or masses. Good bowel sounds. Extremities: no cyanosis, clubbing, rash, edema Neuro: alert & oriented x 3, cranial nerves grossly intact. moves all 4 extremities w/o difficulty. Affect pleasant.   Telemetry   In and out of A fib 80-90s   EKG    N/A  Labs    CBC Recent Labs    06/14/20 1330 06/15/20 0118 06/16/20 0251  06/17/20 0547  WBC 8.0   < > 9.9 12.4*  NEUTROABS 6.4  --   --   --   HGB 10.8*   < > 10.2* 9.6*  HCT 36.6*   < > 33.6* 31.6*  MCV 81.9   < > 79.1* 78.2*  PLT 329   < > 317 269   < > = values in this interval not displayed.   Basic Metabolic Panel Recent Labs    06/14/20 1330 06/14/20 1543 06/16/20 0251 06/16/20 1136  NA 131*   < > 131* 130*  K 5.2*   < > 2.9* 3.3*  CL 95*   < > 87* 86*  CO2 19*   < > 30 29  GLUCOSE 186*   < > 117* 184*  BUN 68*   < > 88* 88*  CREATININE 2.64*   < > 2.71* 2.75*  CALCIUM 8.7*   < > 8.4* 8.4*  MG 2.1  --  2.1  --    < > = values in this interval not displayed.   Liver Function Tests Recent Labs    06/15/20 1300 06/16/20 0251  AST 3,005* 3,132*  ALT 1,397* 1,864*  ALKPHOS 76 96  BILITOT 1.9* 2.3*  PROT 6.1* 6.1*  ALBUMIN 2.8* 2.8*   No results for input(s): LIPASE, AMYLASE in the last 72 hours. Cardiac Enzymes No results for input(s): CKTOTAL, CKMB, CKMBINDEX, TROPONINI in the  last 72 hours.  BNP: BNP (last 3 results) Recent Labs    05/26/20 1429 06/15/20 0729  BNP 1,609.8* 2,424.4*    ProBNP (last 3 results) Recent Labs    07/11/19 1107 04/23/20 1446  PROBNP 445 2,488*     D-Dimer No results for input(s): DDIMER in the last 72 hours. Hemoglobin A1C No results for input(s): HGBA1C in the last 72 hours. Fasting Lipid Panel No results for input(s): CHOL, HDL, LDLCALC, TRIG, CHOLHDL, LDLDIRECT in the last 72 hours. Thyroid Function Tests Recent Labs    06/14/20 2030  TSH 7.588*    Other results:   Imaging    No results found.   Medications:     Scheduled Medications:  Chlorhexidine Gluconate Cloth  6 each Topical Q0600   insulin aspart  0-9 Units Subcutaneous TID WC   insulin glargine  10 Units Subcutaneous QHS   vitamin B-12  1,000 mcg Oral QHS    Infusions:  sodium chloride 10 mL/hr at 06/17/20 0000   amiodarone 30 mg/hr (06/17/20 0000)   heparin 950 Units/hr (06/17/20 0000)   milrinone  0.375 mcg/kg/min (06/17/20 0243)   norepinephrine (LEVOPHED) Adult infusion      PRN Medications:   Assessment/Plan  Acute systolic HF -> 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 NE now off.  - watch closely for need for mechanical support - doubt ischemic as hstrop low despite profound hemodynamic support but given h/p CAD and multiple CRFs will need ischemic eval once more stable and renal function improves - 6/12 CO-OX 43.5%. Started on milrinone 0.25 mcg. Increased milrinone to 0.375 mcg. Co-ox up to 57% today  - CVP 6- 7. Continue to hold diuretics today  - BP stable. Would hold off on adding hydralazine/imdur with AKI (avoid hypotension for renal perfusion)     2. CAD - s/p previous LAD stenting - doubt ischemia is major factor in HF given low hsTrop - plan as above - continue ASA - hold statin with shock liver   3. AF with RVR - plan as above.  - Rate controlled. Continue amio drip.  - Eliquis stopped 6/12 and switched to heparin.  - Continue heparin drip. Watch with some hematuria.  - DC-CV this admission   4. AKI - due to ATN and shock - improving with hemodynamic support - Creatinine trending down 2.95>2.7>>pending    5. Shock liver - due to shock - supportive care   6. DM2 - SSI    7. Hypokalemia  - BMP pending   8. Code status - he was made DNR/DNI but was pretty functional prior to developing HF from AF. I think he has a likel reversible cause for his deterioration. - Dr Haroldine Laws discussed with him and will be Full Code for now.     Length of Stay: [phone], PA-C  06/17/2020, 7:55 AM  Advanced Heart Failure Team Pager [phone] (M-F; 7a - 5p)  Please contact [facility] for night-coverage after hours (5p -7a ) and weekends on  [email or web address]  Patient seen and examined with the above-signed Advanced Practice Provider and/or Housestaff. I personally reviewed laboratory data, imaging studies and relevant notes. I independently examined the patient and formulated the important aspects of the plan. I have edited the note to reflect any of my changes or  salient points. I have personally discussed the plan with the patient and/or family.   Remains on milrinone 0.375. Co-ox now 57%. CVP 6-7 Im/out of AF on amio gtt. Labs hemolyzed so repeating.   Still with some hematuria on heparin (Foley in place)  Feeling better. Denies orthopnea or PND  General:  Elderly male sitting in chair No resp difficulty HEENT: normal Neck: supple. no JVD. Carotids 2+ bilat; no bruits. No lymphadenopathy or thryomegaly appreciated. Cor: PMI nondisplaced. Regular rate & rhythm. No rubs, gallops or murmurs. Lungs: clear Abdomen: soft, nontender, nondistended. No hepatosplenomegaly. No bruits or masses. Good bowel sounds. Extremities: no cyanosis, clubbing, rash, edema Neuro: alert & orientedx3, cranial nerves grossly intact. moves all 4 extremities w/o difficulty. Affect pleasant  Co-ox improved. Continue milrinone support until end-organ function gets better. Hopefully will hold NSR with amio. Continue heparin. Await labs. Would like to get foley out soon. May be able to move out of ICU tomorrow.   Glori Bickers, MD  8:32 AM

## 2020-06-17 NOTE — Evaluation (Addendum)
Physical Therapy Evaluation Patient Details Name: Wesley Winters MRN: [id number] DOB: 1940-11-14 Today's Date: 06/17/2020   History of Present Illness  80 yo admitted 6/11 after 2 falls at home with fatigue and weakness with ARF. PMHx; HF, cardiomyopathy with EF 25%, Afib, CAD, DM, HTN, recent admit 5/23-5/31  Clinical Impression  Pt pleasant and reports increased weakness and falls since last admission but pt clearly does not want to consider SNF at this time. Pt with decreased strength, safety, transfers and gait who will benefit from acute therapy to maximize mobility, balance and independence. Pt with wife to assist at home but will need to demonstrate supervision level mobility prior to D/C.   HR 83 SpO2 100% on RA 109/65 (77)    Follow Up Recommendations Home health PT;Supervision for mobility/OOB    Equipment Recommendations  Rolling walker with 5" wheels;3in1 (PT)    Recommendations for Other Services OT consult     Precautions / Restrictions Precautions Precautions: Fall      Mobility  Bed Mobility Overal bed mobility: Needs Assistance Bed Mobility: Sit to Supine       Sit to supine: Min assist   General bed mobility comments: In chair on arrival. min assist to lift legs to surface to return to supine    Transfers Overall transfer level: Needs assistance   Transfers: Sit to/from Stand Sit to Stand: Min assist         General transfer comment: cues for hand placement with assist to rise from recliner and [facility]  Ambulation/Gait Ambulation/Gait assistance: Min assist Gait Distance (Feet): 35 Feet Assistive device: Rolling walker (2 wheeled) Gait Pattern/deviations: Step-through pattern;Decreased stride length;Trunk flexed   Gait velocity interpretation: 1.31 - 2.62 ft/sec, indicative of limited community ambulator General Gait Details: cues for posture, proximity to RW, safety and chair follow. pt walked 12' to bathroom then additional 60' with slightly  unsteady gait with benefit of RW  Stairs            Wheelchair Mobility    Modified Rankin (Stroke Patients Only)       Balance Overall balance assessment: Needs assistance Sitting-balance support: Feet supported;No upper extremity supported Sitting balance-Leahy Scale: Fair     Standing balance support: Bilateral upper extremity supported Standing balance-Leahy Scale: Poor Standing balance comment: RW for standing and gait                             Pertinent Vitals/Pain Pain Assessment: No/denies pain    Home Living Family/patient expects to be discharged to:: Private residence Living Arrangements: Spouse/significant other Available Help at Discharge: Family;Available 24 hours/day Type of Home: House Home Access: Ramped entrance     Home Layout: One level Home Equipment: Cane - single point      Prior Function Level of Independence: Independent               Hand Dominance        Extremity/Trunk Assessment   Upper Extremity Assessment Upper Extremity Assessment: Generalized weakness    Lower Extremity Assessment Lower Extremity Assessment: Generalized weakness    Cervical / Trunk Assessment Cervical / Trunk Assessment: Kyphotic  Communication   Communication: No difficulties  Cognition Arousal/Alertness: Awake/alert Behavior During Therapy: WFL for tasks assessed/performed Overall Cognitive Status: Impaired/Different from baseline Area of Impairment: Safety/judgement;Memory                     Memory: Decreased short-term memory  Safety/Judgement: Decreased awareness of deficits     General Comments: when therapist stepped out of room and returned with cup pt asking is still the same person who was just here      General Comments      Exercises     Assessment/Plan    PT Assessment Patient needs continued PT services  PT Problem List Decreased mobility;Decreased activity tolerance;Decreased  balance;Decreased knowledge of use of DME;Decreased strength       PT Treatment Interventions DME instruction;Gait training;Functional mobility training;Therapeutic activities;Patient/family education;Balance training;Therapeutic exercise    PT Goals (Current goals can be found in the Care Plan section)  Acute Rehab PT Goals Patient Stated Goal: return home PT Goal Formulation: With patient Time For Goal Achievement: 07/01/20 Potential to Achieve Goals: Fair    Frequency Min 3X/week   Barriers to discharge Decreased caregiver support      Co-evaluation               AM-PAC PT "6 Clicks" Mobility  Outcome Measure Help needed turning from your back to your side while in a flat bed without using bedrails?: A Little Help needed moving from lying on your back to sitting on the side of a flat bed without using bedrails?: A Little Help needed moving to and from a bed to a chair (including a wheelchair)?: A Little Help needed standing up from a chair using your arms (e.g., wheelchair or bedside chair)?: A Little Help needed to walk in hospital room?: A Little Help needed climbing 3-5 steps with a railing? : A Lot 6 Click Score: 17    End of Session Equipment Utilized During Treatment: Gait belt Activity Tolerance: Patient tolerated treatment well Patient left: in bed;with call bell/phone within reach;with bed alarm set Nurse Communication: Mobility status PT Visit Diagnosis: Other abnormalities of gait and mobility (R26.89);Difficulty in walking, not elsewhere classified (R26.2)    Time: 4970-2637 PT Time Calculation (min) (ACUTE ONLY): 25 min   Charges:   PT Evaluation $PT Eval Moderate Complexity: [place], PT Acute Rehabilitation Services Pager: [phone] Office: [phone]   Wesley Winters 06/17/2020, 9:57 AM

## 2020-06-17 NOTE — Consult Note (Signed)
WOC Nurse Consult Note: Patient receiving care in [place]. 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 Pressure Injury POA: Yes/No/NA Measurement: The plantar surface of the left 4th toe appears to have exposed ligament or tendon.  See photo. The left medial midfoot, plantar surface has a full thickness punched out wound with a pink wound bed. See photo. The dorsum of the left 3rd toe has a shallow, dried wound.  See photo. Wound bed: Drainage (amount, consistency, odor)  Periwound: intact Dressing procedure/placement/frequency: Wash wounds on left foot with saline, pat dry. Apply mupirocin and cover with dry gauze, tape in place.  Perform twice daily. This diabetic patient needs to see a podiatrist upon discharge for follow up care of the feet.  Monitor the wound area(s) for worsening of condition such as: Signs/symptoms of infection,  Increase in size,  Development of or worsening of odor, Development of pain, or increased pain at the affected locations.  Notify the medical team if any of these develop.  Thank you for the consult. [place] nurse will not follow at this time.  Please re-consult the [place] team if needed.  Val Riles, RN, MSN, CWOCN, CNS-BC, pager [phone]

## 2020-06-17 NOTE — Progress Notes (Signed)
CONSULT NOTE    Wesley Winters   ZDG:[id number]  DOB: [birth year]  DOA: 06/14/2020 PCP: [name], MD   Brief Narrative:  Wesley Winters is an 80 year old man with chronic systolic heart failure/cardiomyopathy with an EF of 20-25%, atrial fibrillation, coronary artery disease status post stenting, diabetes mellitus, hypertension who presents with weakness and fatigue. He fell in  the bathroom on the night of the 10th and again on 6/11.  He had complaints of bilateral weakness in his legs.    The patient was recently admitted from 5/23 through 9/51 for acute systolic heart failure he was discharged home on 40 mg of Lasix twice daily.  The patient was previously on 20 mg of furosemide daily. During that hospital stay he underwent successful DC cardioversion on 5/25.  6/6: There is a documented phone call note by Dr. Tamala Julian stating to hold Jardiance furosemide and losartan and come back for a bmet in 48 to 72 hours.  He recommended to stop furosemide for 48 hours and then to drop down to 20 mg daily.  6/8: Note by cardiology PA > patient was back in A. fib with heart rate in the low 100s but blood pressure was low and amiodarone was increased from 200 mg daily to 200 mg twice daily. Cont to hold Lasix  6/10: Cardiology (EP) visit> note mentions cardioverting again to get back into normal sinus rhythm - amiodarone was increased to 400 twice a day.  6/11:  AKI noted with a creatinine of 2.64-Baseline creatinine around 1.2-1.3 Acidosis with a bicarb of 19 Sodium 131 Potassium 5.2> 5.5> 6.6 LFTs elevated: AST 110, ALT 87 Noted to be going in and out of rapid A. fib Lactic acid 8.5 Troponin 65 Pulse ox was 98-100% on room air  Rapid response called later in the day due to systolic blood pressures of the in the 80s-she was started on levophed, given a Lasix bolus and drip-Foley placed-cardiology consulted  Subjective: Feels the same weakness he presented to the ED with. No other complaints.      Assessment & Plan:   Principal Problem:   ARF (acute renal failure)- prerenal in setting of poor EF and poor perfusion related to A-fib with RVR   CKD 3a Chronic systolic CHF, right and left heart failure/ Dilated Cardiomyopathy - baseline CR ~ 1.2 - apparently was anuric or oliguric when he presented to the hospital- started making urine after Epi and Lasix infusion-  Epi then discontinued -  Creatinine 2.64 > 3.21  > 2.71 > 2.75> 2.98 - repeat 2D ECHO:  EF < 20% global hypokinesis of LV - RV function severely reduced, RA severely dilated, mod MR - I evacuated the patient on 6/12 and stopped lasix as he appeared euvolemic (99-100% on room air, lungs clear and no pedal edema) - consulted cardiology for inotrope  - Appreciate CHF team managing - started on Milrinone and Levophed on 6/12 - Lasix continues to be held - This AM he was on Milrinone only    Active Problems:   Paroxysmal A fibrillation - likely the cause for his weakness in addition to poor cardiac output - has been on amiodarone 400 mg twice daily which (increased to this recently by EP cardiology on 6/10 with plan for outpt cardioversion) - Coreg on hold since admission   -  6/12> contacted Cards to see if cardioversion needed while in the hospital- Eliquis switched to Heparin and started on IV Amio- HR in 80-90s  Electrolyte disturbances > Hyperkalemia- now hypokalemia > Hyponatremia, hypochloremic - potassium rose from 5.2-6.6 (not hemolyzed)-improved with 2 A of bicarb, 5 units of insulin (with D50) Lokelma and Lasix  - K subsequently dropped below normal- I replaced it yesterday - Bmet has just resulted- K is 3.4- will give 40 meq X 2 of KCL - Sodium down to 126  Lactic acidosis, metabolic acidosis - LA 6.9> 9.9> 4.8> 2.1  - likely due to Metformin in the presence of AKI and poor perfusion related to low cardiac output and A-fib with RVR - dc Metformin altogether as being on Lasix puts him a risk for  recurrent AKI - Jardiance has already been on hold as outpt and will continue to hold  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, 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 - see pictures below- - ulcer on plantar aspect of left 4th toe appear to be down to the bone but thankfully is not infected- he is unable to feel his feet and was not aware it was present - wound care consulted- he has been advised, that he and his wife need to inspect his feet at least weekly- Suspect his wife may be able to do dressings that are ordered by wound care- he also goes to [facility] where he can continue to go  Gross hematuria - pink, blood tinged urine  since 6/13 likely due to Heparin infusion    Elevated LFTs- shock liver Hepatic Function Latest Ref Rng & Units 06/17/2020 06/16/2020 06/15/2020  Total Protein 6.5 - 8.1 g/dL 5.8(L) 6.1(L) 6.1(L)  Albumin 3.5 - 5.0 g/dL 2.5(L) 2.8(L) 2.8(L)  AST 15 - 41 U/L 1,322(H) 3,132(H) 3,005(H)  ALT 0 - 44 U/L 1,453(H) 1,864(H) 1,397(H)  Alk Phosphatase 38 - 126 U/L 98 96 76  Total Bilirubin 0.3 - 1.2 mg/dL 2.0(H) 2.3(H) 1.9(H)  Bilirubin, Direct 0.00 - 0.40 mg/dL - - -     UA consistent with a UTI - hold off on antibiotics and follow- no dysuria or supra-pubic tenderness  Microcytic anemia - I don't an anemia panel done recently - will order      DM2 -A1c 7.5 on 5/23 - holding Metformin - He takes Tujeo 25 mg QHS- reduced to 10 QHS - sugars continue to be fairly stable - cont low dose SSI     CAD in native artery - h/o stenting in the past (3 stents) -  Apixaban >> Heparin infusion - can hold Lipitor until LFTs improve  Morbid obesity Body mass index is 31.54 kg/m.   Appreciate palliative care team following- patient wanting continued aggressive care at this point    Time spent in minutes: 25   Antimicrobials:  Anti-infectives (From admission, onward)    None        Objective: Vitals:   06/17/20 0500 06/17/20 0600 06/17/20 0734 06/17/20  0826  BP: (!) 131/59 137/68    Pulse: 75 82  94  Resp: 16 (!) 27    Temp:   97.6 F (36.4 C)   TempSrc:   Oral   SpO2: 100% 100%  100%  Weight:  94.1 kg    Height:        Intake/Output Summary (Last 24 hours) at 06/17/2020 1007 Last data filed at 06/17/2020 0953 Gross per 24 hour  Intake 706.76 ml  Output 1375 ml  Net -668.24 ml    Filed Weights   06/15/20 0041 06/16/20 0500 06/17/20 0600  Weight: 94.8 kg 91.4 kg 94.1 kg  Examination: General exam: Appears comfortable  HEENT: PERRLA, oral mucosa moist, no sclera icterus or thrush Respiratory system: Clear to auscultation. Respiratory effort normal. Cardiovascular system: S1 & S2 heard, regular rate and rhythm Gastrointestinal system: Abdomen soft, non-tender, nondistended. Normal bowel sounds   Central nervous system: Alert and oriented. No focal neurological deficits. Extremities: No cyanosis, clubbing or edema- charcot feet Skin: No rashes or ulcers Psychiatry:  Mood & affect appropriate.           Data Reviewed: I have personally reviewed following labs and imaging studies  CBC: Recent Labs  Lab 06/14/20 1330 06/15/20 0118 06/16/20 0251 06/17/20 0547 06/17/20 0734  WBC 8.0 12.9* 9.9 12.4* 12.0*  NEUTROABS 6.4  --   --   --   --   HGB 10.8* 11.7* 10.2* 9.6* 9.2*  HCT 36.6* 40.5 33.6* 31.6* 31.0*  MCV 81.9 83.2 79.1* 78.2* 78.9*  PLT 329 394 317 269 151    Basic Metabolic Panel: Recent Labs  Lab 06/14/20 1330 06/14/20 1543 06/15/20 0118 06/15/20 1300 06/16/20 0251 06/16/20 1136  NA 131*  --  132* 130* 131* 130*  K 5.2* 5.5* 6.6* 3.8 2.9* 3.3*  CL 95*  --  97* 92* 87* 86*  CO2 19*  --  9* 21* 30 29  GLUCOSE 186*  --  96 186* 117* 184*  BUN 68*  --  79* 87* 88* 88*  CREATININE 2.64*  --  3.21* 2.95* 2.71* 2.75*  CALCIUM 8.7*  --  8.6* 8.3* 8.4* 8.4*  MG 2.1  --   --   --  2.1  --     GFR: Estimated Creatinine Clearance: 23.1 mL/min (A) (by C-G formula based on SCr of 2.75 mg/dL  (H)). Liver Function Tests: Recent Labs  Lab 06/14/20 1330 06/15/20 0118 06/15/20 1300 06/16/20 0251  AST 110* 342* 3,005* 3,132*  ALT 87* 239* 1,397* 1,864*  ALKPHOS 80 85 76 96  BILITOT 1.2 2.2* 1.9* 2.3*  PROT 6.7 6.7 6.1* 6.1*  ALBUMIN 3.0* 3.0* 2.8* 2.8*    No results for input(s): LIPASE, AMYLASE in the last 168 hours. No results for input(s): AMMONIA in the last 168 hours. Coagulation Profile: No results for input(s): INR, PROTIME in the last 168 hours. Cardiac Enzymes: No results for input(s): CKTOTAL, CKMB, CKMBINDEX, TROPONINI in the last 168 hours. BNP (last 3 results) Recent Labs    07/11/19 1107 04/23/20 1446  PROBNP 445 2,488*    HbA1C: No results for input(s): HGBA1C in the last 72 hours. CBG: Recent Labs  Lab 06/16/20 0702 06/16/20 1132 06/16/20 1544 06/16/20 2049 06/17/20 0640  GLUCAP 103* 164* 139* 200* 150*    Lipid Profile: No results for input(s): CHOL, HDL, LDLCALC, TRIG, CHOLHDL, LDLDIRECT in the last 72 hours. Thyroid Function Tests: Recent Labs    06/14/20 2030  TSH 7.588*    Anemia Panel: No results for input(s): VITAMINB12, FOLATE, FERRITIN, TIBC, IRON, RETICCTPCT in the last 72 hours. Urine analysis:    Component Value Date/Time   COLORURINE YELLOW 06/16/2020 1822   APPEARANCEUR CLEAR 06/16/2020 1822   LABSPEC 1.015 06/16/2020 1822   PHURINE 7.0 06/16/2020 1822   GLUCOSEU NEGATIVE 06/16/2020 1822   HGBUR LARGE (A) 06/16/2020 1822   BILIRUBINUR NEGATIVE 06/16/2020 1822   KETONESUR NEGATIVE 06/16/2020 1822   PROTEINUR >300 (A) 06/16/2020 1822   NITRITE POSITIVE (A) 06/16/2020 1822   LEUKOCYTESUR MODERATE (A) 06/16/2020 1822   Sepsis Labs: @LABRCNTIP (procalcitonin:4,lacticidven:4) ) Recent Results (from the past 240 hour(s))  Resp  Panel by RT-PCR (Flu A&B, Covid) Nasopharyngeal Swab     Status: None   Collection Time: 06/14/20  3:31 PM   Specimen: Nasopharyngeal Swab; Nasopharyngeal(NP) swabs in vial transport medium   Result Value Ref Range Status   SARS Coronavirus 2 by RT PCR NEGATIVE NEGATIVE Final    Comment: (NOTE) SARS-CoV-2 target nucleic acids are NOT DETECTED.  The SARS-CoV-2 RNA is generally detectable in upper respiratory specimens during the acute phase of infection. The lowest concentration of SARS-CoV-2 viral copies this assay can detect is 138 copies/mL. A negative result does not preclude SARS-Cov-2 infection and should not be used as the sole basis for treatment or other patient management decisions. A negative result may occur with  improper specimen collection/handling, submission of specimen other than nasopharyngeal swab, presence of viral mutation(s) within the areas targeted by this assay, and inadequate number of viral copies(<138 copies/mL). A negative result must be combined with clinical observations, patient history, and epidemiological information. The expected result is Negative.  Fact Sheet for Patients:  EntrepreneurPulse.com.au  Fact Sheet for Healthcare Providers:  IncredibleEmployment.be  This test is no t yet approved or cleared by the [place] FDA and  has been authorized for detection and/or diagnosis of SARS-CoV-2 by FDA under an Emergency Use Authorization (EUA). This EUA will remain  in effect (meaning this test can be used) for the duration of the COVID-19 declaration under Section 564(b)(1) of the Act, 21 U.S.C.section 360bbb-3(b)(1), unless the authorization is terminated  or revoked sooner.       Influenza A by PCR NEGATIVE NEGATIVE Final   Influenza B by PCR NEGATIVE NEGATIVE Final    Comment: (NOTE) The Xpert Xpress SARS-CoV-2/FLU/RSV plus assay is intended as an aid in the diagnosis of influenza from Nasopharyngeal swab specimens and should not be used as a sole basis for treatment. Nasal washings and aspirates are unacceptable for Xpert Xpress SARS-CoV-2/FLU/RSV testing.  Fact Sheet for  Patients: EntrepreneurPulse.com.au  Fact Sheet for Healthcare Providers: IncredibleEmployment.be  This test is not yet approved or cleared by the [place] FDA and has been authorized for detection and/or diagnosis of SARS-CoV-2 by FDA under an Emergency Use Authorization (EUA). This EUA will remain in effect (meaning this test can be used) for the duration of the COVID-19 declaration under Section 564(b)(1) of the Act, 21 U.S.C. section 360bbb-3(b)(1), unless the authorization is terminated or revoked.  Performed at [facility], [place] [address], [place], [place] [address]   MRSA Next Gen by PCR, Nasal     Status: None   Collection Time: 06/15/20 12:30 AM  Result Value Ref Range Status   MRSA by PCR Next Gen NOT DETECTED NOT DETECTED Final    Comment: (NOTE) The GeneXpert MRSA Assay (FDA approved for NASAL specimens only), is one component of a comprehensive MRSA colonization surveillance program. It is not intended to diagnose MRSA infection nor to guide or monitor treatment for MRSA infections. Test performance is not FDA approved in patients less than 27 years old. Performed at [facility], [place] [address] [place], [place] [address]   Culture, blood (routine x 2)     Status: None (Preliminary result)   Collection Time: 06/15/20  1:06 AM   Specimen: BLOOD RIGHT HAND  Result Value Ref Range Status   Specimen Description BLOOD RIGHT HAND  Final   Special Requests   Final    BOTTLES DRAWN AEROBIC ONLY Blood Culture adequate volume   Culture   Final  NO GROWTH 2 DAYS Performed at [facility], [place] [address], [place], [place] [address]    Report Status PENDING  Incomplete  Culture, blood (routine x 2)     Status: None (Preliminary result)   Collection Time: 06/15/20  1:15 AM   Specimen: BLOOD LEFT HAND  Result Value Ref Range Status   Specimen Description BLOOD LEFT HAND  Final   Special Requests   Final     BOTTLES DRAWN AEROBIC ONLY Blood Culture results may not be optimal due to an inadequate volume of blood received in culture bottles   Culture   Final    NO GROWTH 2 DAYS Performed at [facility], [place] [address] [place],  [address]    Report Status PENDING  Incomplete  Culture, Urine     Status: None   Collection Time: 06/15/20  2:22 AM   Specimen: Urine, Random  Result Value Ref Range Status   Specimen Description URINE, RANDOM  Final   Special Requests NONE  Final   Culture   Final    NO GROWTH Performed at [facility], [place] [address], [place],  [address]    Report Status 06/16/2020 FINAL  Final         Radiology Studies: DG CHEST PORT 1 VIEW  Result Date: 06/15/2020 CLINICAL DATA:  Central line EXAM: PORTABLE CHEST 1 VIEW COMPARISON:  June 14, 2020 FINDINGS: The cardiomediastinal silhouette is unchanged and enlarged in contour.RIGHT IJ CVC tip terminates over the region of the inferior SVC. Atherosclerotic calcifications of the aorta. No pleural effusion. No pneumothorax. Unchanged LEFT retrocardiac opacity, likely atelectasis no acute pleuroparenchymal abnormality. Visualized abdomen is unremarkable. Multilevel degenerative changes of the thoracic spine. IMPRESSION: RIGHT IJ CVC tip terminates over the region of the inferior SVC. Electronically Signed   By: Valentino Saxon MD   On: 06/15/2020 18:39   ECHOCARDIOGRAM LIMITED  Result Date: 06/15/2020    ECHOCARDIOGRAM LIMITED REPORT   Patient Name:   Wesley Winters Date of Exam: 06/15/2020 Medical Rec #:  [id number]    Height:       68.0 in Accession #:    [id number]   Weight:       209.0 lb Date of Birth:  11-06-1940    BSA:          2.082 m Patient Age:    74 years     BP:           119/71 mmHg Patient Gender: M            HR:           108 bpm. Exam Location:  Inpatient Procedure: Limited Echo, Cardiac Doppler, Color Doppler and Intracardiac            Opacification Agent Indications:    Shock  History:         Patient has prior history of Echocardiogram examinations, most                 recent 05/05/2020. CAD, Arrythmias:Atrial Fibrillation; Risk                 Factors:Hypertension, Dyslipidemia, Sleep Apnea and Diabetes.                 S/P PCI.  Sonographer:    Clayton Lefort RDCS (AE) Referring Phys: [id number] [place]  1. Left ventricular ejection fraction, by estimation, is <20%. The left ventricle has severely decreased function.  The left ventricle demonstrates global hypokinesis. The left ventricular internal cavity size was severely dilated. Left ventricular diastolic function could not be evaluated.  2. Right ventricular systolic function is severely reduced. The right ventricular size is moderately enlarged. There is moderately elevated pulmonary artery systolic pressure. The estimated right ventricular systolic pressure is 65.7 mmHg.  3. Right atrial size was severely dilated.  4. The mitral valve is degenerative. Moderate mitral valve regurgitation. No evidence of mitral stenosis.  5. The aortic valve is tricuspid. Aortic valve regurgitation is not visualized. Mild aortic valve sclerosis is present, with no evidence of aortic valve stenosis.  6. The inferior vena cava is dilated in size with <50% respiratory variability, suggesting right atrial pressure of 15 mmHg. FINDINGS  Left Ventricle: Left ventricular ejection fraction, by estimation, is <20%. The left ventricle has severely decreased function. The left ventricle demonstrates global hypokinesis. Definity contrast agent was given IV to delineate the left ventricular endocardial borders. The left ventricular internal cavity size was severely dilated. There is no left ventricular hypertrophy. Left ventricular diastolic function could not be evaluated. Left ventricular diastolic function could not be evaluated due to atrial fibrillation. Right Ventricle: The right ventricular size is moderately enlarged. No increase in right ventricular  wall thickness. Right ventricular systolic function is severely reduced. There is moderately elevated pulmonary artery systolic pressure. The tricuspid regurgitant velocity is 3.31 m/s, and with an assumed right atrial pressure of 15 mmHg, the estimated right ventricular systolic pressure is 90.3 mmHg. Left Atrium: Left atrial size was normal in size. Right Atrium: Right atrial size was severely dilated. Pericardium: There is no evidence of pericardial effusion. Mitral Valve: The mitral valve is degenerative in appearance. There is mild thickening of the mitral valve leaflet(s). There is mild calcification of the mitral valve leaflet(s). Moderate mitral valve regurgitation. No evidence of mitral valve stenosis. Tricuspid Valve: The tricuspid valve is normal in structure. Tricuspid valve regurgitation is mild . No evidence of tricuspid stenosis. Aortic Valve: The aortic valve is tricuspid. Aortic valve regurgitation is not visualized. Mild aortic valve sclerosis is present, with no evidence of aortic valve stenosis. Pulmonic Valve: The pulmonic valve was normal in structure. Pulmonic valve regurgitation is not visualized. No evidence of pulmonic stenosis. Aorta: The aortic root is normal in size and structure. Venous: The inferior vena cava is dilated in size with less than 50% respiratory variability, suggesting right atrial pressure of 15 mmHg. IAS/Shunts: No atrial level shunt detected by color flow Doppler. LEFT VENTRICLE PLAX 2D LVIDd:         6.40 cm LVIDs:         5.80 cm LV PW:         0.90 cm LV IVS:        0.80 cm LVOT diam:     2.10 cm LVOT Area:     3.46 cm  IVC IVC diam: 2.70 cm LEFT ATRIUM         Index LA diam:    3.50 cm 1.68 cm/m   AORTA Ao Root diam: 3.40 cm Ao Asc diam:  3.40 cm MR Peak grad: 66.9 mmHg   TRICUSPID VALVE MR Mean grad: 45.0 mmHg   TR Peak grad:   43.8 mmHg MR Vmax:      409.00 cm/s TR Vmax:        331.00 cm/s MR Vmean:     317.0 cm/s  SHUNTS                            Systemic Diam: 2.10 cm Fransico Him MD Electronically signed by Fransico Him MD Signature Date/Time: 06/15/2020/11:02:38 AM    Final       Scheduled Meds:  Chlorhexidine Gluconate Cloth  6 each Topical Q0600   insulin aspart  0-9 Units Subcutaneous TID WC   insulin glargine  10 Units Subcutaneous QHS   mupirocin ointment   Topical BID   vitamin B-12  1,000 mcg Oral QHS   Continuous Infusions:  sodium chloride 10 mL/hr at 06/17/20 0000   amiodarone 30 mg/hr (06/17/20 0000)   heparin 950 Units/hr (06/17/20 0000)   milrinone 0.375 mcg/kg/min (06/17/20 0243)   norepinephrine (LEVOPHED) Adult infusion       LOS: 3 days      Debbe Odea, MD [facility] Pager: [email or web address] 06/17/2020, 10:07 AM

## 2020-06-17 NOTE — Progress Notes (Signed)
Patient ID: Wesley Winters, male   DOB: Jun 30, 1940, 80 y.o.   MRN: [id number]    Progress Note from the Palliative Medicine Team at [facility]   Patient Name: Wesley Winters        Date: 06/17/2020 DOB: 01-12-1940  Age: 80 y.o. MRN#: [id number] Attending Physician: Jolaine Artist, MD Primary Care Physician: [name], MD Admit Date: 06/14/2020   Medical records reviewed   80 y.o. male   admitted on 06/14/2020 with history of cardiomyopathy with EF of 25% likely tachycardia related with history of A. fib, CAD status post stenting, diabetes mellitus was recently admitted for CHF and also underwent cardioversion discharged on Jun 03, 2020 about 10 days ago has been having poor appetite and weakness, and three reported falls per his wife.   Complaining of the weakness, patient was brought to the ER.  Patient reports poor appetite and nausea,  denies any abdominal pain.     In the ER patient was found to have intermittent episodes of A. fib with RVR labs are significant for creatinine worsening from 1.2 about 4 days ago and it is around 2.6.  LFTs are elevated at AST of 110 ALT of 87 total bilirubin 1.2.  CBC is around baseline.  Patient was given to 50 cc normal saline bolus given that patient had acute renal failure.  Abdomen appears benign.  COVID test was negative.  Patient underwent CT head and CT chest abdomen pelvis which does not show any acute.  Admitted for treatment and stabilization.  Currently on Milrinone, Amiodarone, Heparin   He reports weakness.  Patient and family face treatment option decisions, advanced directive decisions and anticipatory care needs.   This NP visited patient at the bedside as a follow up to  yesterday's Scott.  Patient is alert and oriented, his wife and daughter at the bedside  Continue education offered regarding current medical situation.  All voice concern regarding anticipatory care needs into the future.  Transition of care options depends on  outcomes and wishes for life prolonging measures   Family presented documents; H POA, living will and declaration for a natural death  Patient and his wife support each other as they clearly state that patient has no desire for dialysis now or in the future, independence and quality are more important than quantity of life, and life outside of home is unacceptable.  Discussed with patient the importance of continued conversation with family and their  medical providers regarding overall plan of care and treatment options,  ensuring decisions are within the context of the patients values and GOCs.  Questions and concerns addressed   PMT will continue to support holistically   Total time spent on the unit was 35 minutes  Greater than 50% of the time was spent in counseling and coordination of care  [name] NP  Palliative Medicine Team Team Phone # [phone] Pager [phone]

## 2020-06-17 NOTE — Progress Notes (Signed)
Inpatient Diabetes Program Recommendations  AACE/ADA: New Consensus Statement on Inpatient Glycemic Control   Target Ranges:  Prepandial:   less than 140 mg/dL      Peak postprandial:   less than 180 mg/dL (1-2 hours)      Critically ill patients:  140 - 180 mg/dL   Results for Wesley Winters, Wesley Winters (MRN [id number]) as of 06/17/2020 11:45  Ref. Range 06/17/2020 06:40 06/17/2020 11:27  Glucose-Capillary Latest Ref Range: 70 - 99 mg/dL 150 (H) 280 (H)   Results for Wesley Winters, Wesley Winters (MRN [id number]) as of 06/17/2020 11:45  Ref. Range 06/16/2020 07:02 06/16/2020 11:32 06/16/2020 15:44 06/16/2020 20:49  Glucose-Capillary Latest Ref Range: 70 - 99 mg/dL 103 (H) 164 (H) 139 (H) 200 (H)   Review of Glycemic Control  Diabetes history: DM2 Outpatient Diabetes medications: Toujeo 25 units QHS, Metformin XR 1000 mg BID Current orders for Inpatient glycemic control: Lantus 10 units QHS, Novolog 0-9 units TID with meals  Inpatient Diabetes Program Recommendations:    Insulin: Please consider ordering Novolog 0-5 units QHS and Novolog 2 units TID with meals for meal coverage if patient eats at least 50% of meals.  Thanks, Barnie Alderman, RN, MSN, CDE Diabetes Coordinator Inpatient Diabetes Program [phone] (Team Pager from 8am to 5pm)

## 2020-06-17 NOTE — Progress Notes (Signed)
Clinical Decision Making Rationale The following actions/interventions were taken related to the care of Wesley Winters, a 80 y.o. male:  Critical lab results reported from lab. Will redraw to verify results. Primary RN Janett Billow More notified.

## 2020-06-17 NOTE — Progress Notes (Signed)
ANTICOAGULATION CONSULT NOTE - Follow Up Consult  Pharmacy Consult for heparin Indication: atrial fibrillation  Labs: Recent Labs    06/14/20 2202 06/15/20 0118 06/15/20 0118 06/15/20 1300 06/16/20 0251 06/16/20 1136 06/16/20 1356 06/17/20 0338 06/17/20 0547  HGB  --  11.7*  --   --  10.2*  --   --   --  9.6*  HCT  --  40.5  --   --  33.6*  --   --   --  31.6*  PLT  --  394  --   --  317  --   --   --  269  APTT  --   --    < >  --  121* 147* 159* 107*  --   HEPARINUNFRC  --   --   --   --  >1.10*  --   --  >1.10*  --   CREATININE  --  3.21*  --  2.95* 2.71* 2.75*  --   --   --   TROPONINIHS 65* 80*  --   --   --   --   --   --   --    < > = values in this interval not displayed.    Assessment: 80yo male remains supratherapeutic on heparin after rate change though closer to goal; no gtt issues or signs of bleeding per RN.  Goal of Therapy:  aPTT 66-102 seconds   Plan:  Will decrease heparin gtt by 1-2 units/kg/hr to 800 units/hr and check PTT in 8 hours.    Wynona Neat, PharmD, BCPS  06/17/2020,6:37 AM

## 2020-06-18 ENCOUNTER — Inpatient Hospital Stay (HOSPITAL_COMMUNITY): Payer: Medicare Other

## 2020-06-18 ENCOUNTER — Encounter (HOSPITAL_COMMUNITY): Payer: Medicare Other

## 2020-06-18 LAB — COMPREHENSIVE METABOLIC PANEL
ALT: 1112 U/L — ABNORMAL HIGH (ref 0–44)
AST: 666 U/L — ABNORMAL HIGH (ref 15–41)
Albumin: 2.4 g/dL — ABNORMAL LOW (ref 3.5–5.0)
Alkaline Phosphatase: 94 U/L (ref 38–126)
Anion gap: 11 (ref 5–15)
BUN: 75 mg/dL — ABNORMAL HIGH (ref 8–23)
CO2: 30 mmol/L (ref 22–32)
Calcium: 7.7 mg/dL — ABNORMAL LOW (ref 8.9–10.3)
Chloride: 88 mmol/L — ABNORMAL LOW (ref 98–111)
Creatinine, Ser: 2.3 mg/dL — ABNORMAL HIGH (ref 0.61–1.24)
GFR, Estimated: 28 mL/min — ABNORMAL LOW (ref >60)
Glucose, Bld: 161 mg/dL — ABNORMAL HIGH (ref 70–99)
Potassium: 3.6 mmol/L (ref 3.5–5.1)
Sodium: 129 mmol/L — ABNORMAL LOW (ref 135–145)
Total Bilirubin: 2.1 mg/dL — ABNORMAL HIGH (ref 0.3–1.2)
Total Protein: 5.6 g/dL — ABNORMAL LOW (ref 6.5–8.1)

## 2020-06-18 LAB — APTT
aPTT: 62 seconds — ABNORMAL HIGH (ref 24–36)
aPTT: 66 seconds — ABNORMAL HIGH (ref 24–36)
aPTT: 76 seconds — ABNORMAL HIGH (ref 24–36)

## 2020-06-18 LAB — MAGNESIUM: Magnesium: 2.1 mg/dL (ref 1.7–2.4)

## 2020-06-18 LAB — GLUCOSE, CAPILLARY
Glucose-Capillary: 149 mg/dL — ABNORMAL HIGH (ref 70–99)
Glucose-Capillary: 250 mg/dL — ABNORMAL HIGH (ref 70–99)

## 2020-06-18 LAB — CBC
HCT: 30.5 % — ABNORMAL LOW (ref 39.0–52.0)
Hemoglobin: 9.5 g/dL — ABNORMAL LOW (ref 13.0–17.0)
MCH: 23.9 pg — ABNORMAL LOW (ref 26.0–34.0)
MCHC: 31.1 g/dL (ref 30.0–36.0)
MCV: 76.8 fL — ABNORMAL LOW (ref 80.0–100.0)
Platelets: 256 10*3/uL (ref 150–400)
RBC: 3.97 MIL/uL — ABNORMAL LOW (ref 4.22–5.81)
RDW: 19.1 % — ABNORMAL HIGH (ref 11.5–15.5)
WBC: 15.1 10*3/uL — ABNORMAL HIGH (ref 4.0–10.5)
nRBC: 0.2 % (ref 0.0–0.2)

## 2020-06-18 LAB — COOXEMETRY PANEL
Carboxyhemoglobin: 1.4 % (ref 0.5–1.5)
Methemoglobin: 1.2 % (ref 0.0–1.5)
O2 Saturation: 58.2 %
Total hemoglobin: 9.6 g/dL — ABNORMAL LOW (ref 12.0–16.0)

## 2020-06-18 LAB — PROCALCITONIN: Procalcitonin: 2.72 ng/mL

## 2020-06-18 LAB — HEPARIN LEVEL (UNFRACTIONATED): Heparin Unfractionated: >1.1 IU/mL — ABNORMAL HIGH (ref 0.30–0.70)

## 2020-06-18 MED ORDER — ACETAMINOPHEN 325 MG PO TABS
650.0000 mg | ORAL_TABLET | Freq: Four times a day (QID) | ORAL | Status: DC | PRN
  Administered 2020-06-18 – 2020-07-09 (×7): 650 mg via ORAL
  Filled 2020-06-18 (×7): qty 2

## 2020-06-18 MED ORDER — SODIUM CHLORIDE 0.9 % IV SOLN: 2.0000 g | INTRAVENOUS | Status: DC

## 2020-06-18 MED ORDER — AMIODARONE LOAD VIA INFUSION
150.0000 mg | Freq: Once | INTRAVENOUS | Status: AC
  Administered 2020-06-18: 150 mg via INTRAVENOUS
  Filled 2020-06-18: qty 83.34

## 2020-06-18 MED ORDER — NOREPINEPHRINE 16 MG/250ML-% IV SOLN
0.0000 ug/min | INTRAVENOUS | Status: DC
  Administered 2020-06-18: 13 ug/min via INTRAVENOUS
  Administered 2020-06-19: 5 ug/min via INTRAVENOUS
  Filled 2020-06-18 (×2): qty 250

## 2020-06-18 MED ORDER — POTASSIUM CHLORIDE CRYS ER 10 MEQ PO TBCR
20.0000 meq | EXTENDED_RELEASE_TABLET | Freq: Once | ORAL | Status: AC
  Administered 2020-06-18: 20 meq via ORAL
  Filled 2020-06-18: qty 2

## 2020-06-18 MED ORDER — POTASSIUM CHLORIDE CRYS ER 20 MEQ PO TBCR
20.0000 meq | EXTENDED_RELEASE_TABLET | Freq: Once | ORAL | Status: AC
  Administered 2020-06-18: 20 meq via ORAL
  Filled 2020-06-18: qty 1

## 2020-06-18 MED ORDER — TAMSULOSIN HCL 0.4 MG PO CAPS
0.4000 mg | ORAL_CAPSULE | Freq: Every day | ORAL | Status: DC
  Administered 2020-06-18 – 2020-07-09 (×22): 0.4 mg via ORAL
  Filled 2020-06-18 (×22): qty 1

## 2020-06-18 MED ORDER — SODIUM CHLORIDE 0.9 % IV SOLN
2.0000 g | INTRAVENOUS | Status: DC
  Administered 2020-06-18: 2 g via INTRAVENOUS
  Filled 2020-06-18: qty 2

## 2020-06-18 MED ORDER — MUPIROCIN 2 % EX OINT
TOPICAL_OINTMENT | Freq: Two times a day (BID) | CUTANEOUS | Status: DC
  Administered 2020-06-19 – 2020-07-08 (×7): 1 via TOPICAL
  Filled 2020-06-18 (×2): qty 22

## 2020-06-18 MED ORDER — AMIODARONE IV BOLUS ONLY 150 MG/100ML: 150.0000 mg | Freq: Once | INTRAVENOUS | Status: DC

## 2020-06-18 MED ORDER — MUPIROCIN 2 % EX OINT: TOPICAL_OINTMENT | Freq: Two times a day (BID) | CUTANEOUS | Status: DC

## 2020-06-18 NOTE — Progress Notes (Signed)
Assumed care of the patient around 1245.   Pt urinated 72mL brown, bloody urine in bedside commode but continued to endorse feeling the urge to urinate. Bladder scan volume of 451, foley removed at 1000 by previous RN.   Reached out to Dr. Haroldine Laws about replacing the foley and starting flomax given increased WBC, brown/tea urine, and starting abx. Order obtained to replace foley d/t retention.

## 2020-06-18 NOTE — Progress Notes (Addendum)
Advanced Heart Failure Rounding Note  PCP-Cardiologist: Sinclair Grooms, MD   Subjective:   Cardiogenic shock. New onset AF.   6/12 CO-OX 43% --->Started on milrinone 0.25 mcg.  6/13 Milrinone increased to 0.375. Co-ox 58% today.   Back in AFib w/ RVR 130s-150s. Asymptomatic.   Febrile, temp 100.8 this AM. WBC 15K   Diuretics held yesterday for low CVP, 7-8 today   SCr improving, 2.6>>2.4>>2.3   Na 126>>129  LFTs trending down   Sitting up in bed. Feels tired. No dyspnea or cough.   Objective:   Weight Range: 92.3 kg Body mass index is 30.94 kg/m.   Vital Signs:   Temp:  [97.5 F (36.4 C)-100.8 F (38.2 C)] 100.8 F (38.2 C) (06/15 0751) Pulse Rate:  [64-114] 114 (06/15 0700) Resp:  [14-31] 24 (06/15 0700) BP: (92-142)/(49-131) 92/65 (06/15 0700) SpO2:  [96 %-100 %] 100 % (06/15 0700) Weight:  [92.3 kg] 92.3 kg (06/15 0500) Last BM Date: 06/17/20  Weight change: Filed Weights   06/16/20 0500 06/17/20 0600 06/18/20 0500  Weight: 91.4 kg 94.1 kg 92.3 kg    Intake/Output:   Intake/Output Summary (Last 24 hours) at 06/18/2020 0827 Last data filed at 06/18/2020 0700 Gross per 24 hour  Intake 1638.54 ml  Output 2315 ml  Net -676.46 ml      Physical Exam   CVP 7-8  General:  Fatigued appearing elderly WM. No respiratory difficulty HEENT: normal Neck: supple. JVD 8 cm + Rt IJ CVC Carotids 2+ bilat; no bruits. No lymphadenopathy or thyromegaly appreciated. irregularly irregular, tachy rate No rubs, gallops or murmurs. Lungs: clear Abdomen: soft, nontender, nondistended. No hepatosplenomegaly. No bruits or masses. Good bowel sounds. Extremities: no cyanosis, clubbing, rash, trace bilateral LE edema + ted hoses  Neuro: alert & oriented x 3, cranial nerves grossly intact. moves all 4 extremities w/o difficulty. Affect pleasant. GU: + foley     Telemetry   Afib w/ RVR 130s-150s   EKG    N/A  Labs    CBC Recent Labs    06/17/20 0734  06/18/20 0443  WBC 12.0* 15.1*  HGB 9.2* 9.5*  HCT 31.0* 30.5*  MCV 78.9* 76.8*  PLT 274 606   Basic Metabolic Panel Recent Labs    06/17/20 0734 06/17/20 1823 06/17/20 2148 06/18/20 0443  NA 126*   < > 128* 129*  K 3.4*   < > 3.6 3.6  CL 86*   < > 88* 88*  CO2 28   < > 30 30  GLUCOSE 258*   < > 209* 161*  BUN 88*   < > 79* 75*  CREATININE 2.98*   < > 2.43* 2.30*  CALCIUM 7.9*   < > 7.9* 7.7*  MG 1.8  --   --  2.1   < > = values in this interval not displayed.   Liver Function Tests Recent Labs    06/17/20 0734 06/18/20 0443  AST 1,322* 666*  ALT 1,453* 1,112*  ALKPHOS 98 94  BILITOT 2.0* 2.1*  PROT 5.8* 5.6*  ALBUMIN 2.5* 2.4*   No results for input(s): LIPASE, AMYLASE in the last 72 hours. Cardiac Enzymes No results for input(s): CKTOTAL, CKMB, CKMBINDEX, TROPONINI in the last 72 hours.  BNP: BNP (last 3 results) Recent Labs    05/26/20 1429 06/15/20 0729  BNP 1,609.8* 2,424.4*    ProBNP (last 3 results) Recent Labs    07/11/19 1107 04/23/20 1446  PROBNP 445 2,488*  D-Dimer No results for input(s): DDIMER in the last 72 hours. Hemoglobin A1C No results for input(s): HGBA1C in the last 72 hours. Fasting Lipid Panel No results for input(s): CHOL, HDL, LDLCALC, TRIG, CHOLHDL, LDLDIRECT in the last 72 hours. Thyroid Function Tests No results for input(s): TSH, T4TOTAL, T3FREE, THYROIDAB in the last 72 hours.  Invalid input(s): FREET3   Other results:   Imaging    No results found.   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 NE now off.  - watch closely for need for mechanical support - doubt ischemic as hstrop low despite profound hemodynamic support but given h/p CAD and multiple CRFs will need ischemic eval once more stable and renal function improves - 6/12 CO-OX 43.5%. Started on milrinone 0.25 mcg. Increased milrinone to 0.375 mcg. Co-ox up to 58% today  - CVP 7-8. Continue to hold diuretics today  - BP soft. Would hold off on adding hydralazine/imdur with AKI (avoid hypotension for renal perfusion)     2. CAD - s/p previous LAD stenting - doubt ischemia is major factor in HF given low hsTrop - plan as above - continue ASA - hold statin with shock liver   3. AF with RVR - rates 130s-150s today  - Rebolus IV amio and increase gtt back to 60  - Eliquis stopped 6/12 and switched to heparin.  - Continue heparin drip. Watch with some hematuria.  - DC-CV this admission   4. AKI - due to ATN and shock - improving with hemodynamic support - Creatinine trending down 2.95>2.7>>2.4>>2.3  5. Shock liver - due to shock, improving  - supportive care   6. DM2 - SSI    7. Hypokalemia  - K 3.6  - supp KCl   8. Code status - he was made DNR/DNI but was pretty functional prior to developing HF from AF. I think he has a likely reversible cause for his deterioration. - Dr Haroldine Laws discussed with him and will be Full Code for now.   9. ID: Febrile this am. mTep 100.8. WBC 15K - check PCT + CXR   10. Wound on left foot - deep but clean based - consult wound care   Length of Stay: [address], PA-C  06/18/2020, 8:27 AM  Advanced Heart Failure  Team Pager [phone] (M-F; 7a - 5p)  Please contact [facility] for night-coverage after hours (5p -7a ) and weekends on [email or web address]  Agree with above.   He remains on milrinone 0.375. Co-ox 58% CVP 6-7.   He is back in AF with RVR. Lethargic this am. Febrile to 101. WBC up to 15K SBP 70-80   General:  Sitting up in  bed. No resp difficulty Lethargic HEENT: normal Neck: supple. RIJ TLC. Carotids 2+ bilat; no bruits. No lymphadenopathy or thryomegaly appreciated. Cor: PMI nondisplaced. Irregular tachy  No rubs, gallops or murmurs. Lungs: clear Abdomen: obese soft, nontender, nondistended. No hepatosplenomegaly. No bruits or masses. Good bowel sounds. + foley Extremities: no cyanosis, clubbing, rash, tr edema wound under left foot Neuro: alert & orientedx3, cranial nerves grossly intact. moves all 4 extremities w/o difficulty. Affect pleasant but lethargic  He is back in AF with RVR. He also appears septic.   Get bcx. Start cefepime. Add norepi to keep maps > 70. Can give IVF if needed. Pull foley and watch for retention. Once BP stable will rebolus amio. Renal and hepatic function improved.   CRITICAL CARE Performed by: Glori Bickers  Total critical care time: 45 minutes  Critical care time was exclusive of separately billable procedures and treating other patients.  Critical care was necessary to treat or prevent imminent or life-threatening deterioration.  Critical care was time spent personally by me (independent of midlevel providers or residents) on the following activities: development of treatment plan with patient and/or surrogate as well as nursing, discussions with consultants, evaluation of patient's response to treatment, examination of patient, obtaining history from patient or surrogate, ordering and performing treatments and interventions, ordering and review of laboratory studies, ordering and review of radiographic studies, pulse oximetry and re-evaluation of  patient's condition.  Glori Bickers, MD  9:46 AM

## 2020-06-18 NOTE — Progress Notes (Signed)
RT placed patient on CPAP HS. 2L O2 bleed in needed. Patient tolerating well.  ?

## 2020-06-18 NOTE — Progress Notes (Signed)
Inpatient Diabetes Program Recommendations  AACE/ADA: New Consensus Statement on Inpatient Glycemic Control (2015)  Target Ranges:  Prepandial:   less than 140 mg/dL      Peak postprandial:   less than 180 mg/dL (1-2 hours)      Critically ill patients:  140 - 180 mg/dL   Lab Results  Component Value Date   GLUCAP 149 (H) 06/18/2020   HGBA1C 7.5 (H) 05/26/2020    Review of Glycemic Control Results for Wesley Winters, Wesley Winters (MRN [id number]) as of 06/18/2020 11:16  Ref. Range 06/17/2020 06:40 06/17/2020 11:27 06/17/2020 16:26 06/17/2020 21:33 06/18/2020 06:36  Glucose-Capillary Latest Ref Range: 70 - 99 mg/dL 150 (H) 280 (H) 165 (H) 198 (H) 149 (H)  Diabetes history: DM2 Outpatient Diabetes medications: Toujeo 25 units QHS, Metformin XR 1000 mg BID Current orders for Inpatient glycemic control: Lantus 10 units QHS, Novolog 0-9 units TID with meals   Inpatient Diabetes Program Recommendations:     Insulin: Please consider adding Novolog 2 units TID with meals for meal coverage if patient eats at least 50% of meals.  Thanks,  Adah Perl, RN, BC-ADM Inpatient Diabetes Coordinator Pager [phone]  (8a-5p)

## 2020-06-18 NOTE — Consult Note (Signed)
WOC Nurse Consult Note: Patient receiving care in [place]. Reason for Consult: "L fooy ulcer" Wound type: The wounds on the left foot were addressed by me and orders entered 06/17/20.  Please see my note on that date at 10:00.  No further action is needed. Orders are in the record. [place] nurse will not follow at this time.  Please re-consult the [place] team if needed.  Val Riles, RN, MSN, CWOCN, CNS-BC, pager [phone]

## 2020-06-18 NOTE — Progress Notes (Signed)
ANTICOAGULATION CONSULT NOTE  Pharmacy Consult for heparin Indication: atrial fibrillation  Labs: Recent Labs    06/16/20 0251 06/16/20 1136 06/17/20 0338 06/17/20 0547 06/17/20 0734 06/17/20 1430 06/17/20 1823 06/17/20 2148 06/18/20 0128 06/18/20 0443 06/18/20 1035 06/18/20 2037  HGB 10.2*  --   --  9.6* 9.2*  --   --   --   --  9.5*  --   --   HCT 33.6*  --   --  31.6* 31.0*  --   --   --   --  30.5*  --   --   PLT 317  --   --  269 274  --   --   --   --  256  --   --   APTT 121*   < > 107*  --   --    < >  --   --  62*  --  66* 76*  HEPARINUNFRC >1.10*  --  >1.10*  --   --   --   --   --  >1.10*  --   --   --   CREATININE 2.71*   < >  --   --  2.98*  --  2.56* 2.43*  --  2.30*  --   --    < > = values in this interval not displayed.     Assessment: 80 yo M on apixaban PTA for afib now holding for cardiogenic shock. Last dose of apixaban 6/12 at 10am. Pharmacy consulted for heparin.   aPTT came back therapeutic at 76 - drawn central line, heparin running peripheral. Hgb 9.5, plt 256. No s/sx of bleeding outside of hematuria (monitoring closely) or infusion issues.   Goal of Therapy:  Heparin level 0.3-0.7 units/ml aPTT 66-102 seconds Monitor platelets by anticoagulation protocol: Yes   Plan:  Continue heparin infusion at 750 units/hr  Monitor daily aPTT, HL, CBC/plt Monitor for signs/symptoms of bleeding  F/u restart apixaban after procedures    Thanks for allowing pharmacy to be a part of this patient's care.  Sloan Leiter, PharmD, BCPS, BCCCP Clinical Pharmacist Please refer to [facility] for [place] numbers 06/18/2020 9:51 PM After 10:00 PM, call [place] [phone]

## 2020-06-18 NOTE — Progress Notes (Signed)
Pharmacist Heart Failure Core Measure Documentation  Assessment: Wesley Winters has an EF documented as 20% on 6/22 by ECHO.  Rationale: Heart failure patients with left ventricular systolic dysfunction (LVSD) and an EF < 40% should be prescribed an angiotensin converting enzyme inhibitor (ACEI) or angiotensin receptor blocker (ARB) at discharge unless a contraindication is documented in the medical record.  This patient is not currently on an ACEI or ARB for HF.  This note is being placed in the record in order to provide documentation that a contraindication to the use of these agents is present for this encounter.  ACE Inhibitor or Angiotensin Receptor Blocker is contraindicated (specify all that apply)  []   ACEI allergy AND ARB allergy []   Angioedema []   Moderate or severe aortic stenosis []   Hyperkalemia [x]   Hypotension []   Renal artery stenosis [x]   Worsening renal function, preexisting renal disease or dysfunction    [name] Pharm.D. CPP, BCPS Clinical Pharmacist [phone] 06/18/2020 3:39 PM

## 2020-06-18 NOTE — Progress Notes (Signed)
[place] for heparin Indication: atrial fibrillation  Labs: Recent Labs    06/16/20 0251 06/16/20 1136 06/17/20 0338 06/17/20 0547 06/17/20 0734 06/17/20 1430 06/17/20 1823 06/17/20 2148 06/18/20 0128 06/18/20 0443 06/18/20 1035  HGB 10.2*  --   --  9.6* 9.2*  --   --   --   --  9.5*  --   HCT 33.6*  --   --  31.6* 31.0*  --   --   --   --  30.5*  --   PLT 317  --   --  269 274  --   --   --   --  256  --   APTT 121*   < > 107*  --   --  106*  --   --  62*  --  66*  HEPARINUNFRC >1.10*  --  >1.10*  --   --   --   --   --  >1.10*  --   --   CREATININE 2.71*   < >  --   --  2.98*  --  2.56* 2.43*  --  2.30*  --    < > = values in this interval not displayed.      Assessment: 80 yo M on apixaban PTA for afib now holding for cardiogenic shock. Last dose of apixaban 6/12 at 10am. Pharmacy consulted for heparin.   aPTT came back therapeutic at 66 - drawn central line, heparin running peripheral. Hgb 9.5, plt 256. No s/sx of bleeding outside of hematuria (monitoring closely) or infusion issues.   Goal of Therapy:  Heparin level 0.3-0.7 units/ml aPTT 66-102 seconds Monitor platelets by anticoagulation protocol: Yes   Plan:  Continue heparin infusion at 750 units/hr  F/u aPTT in 8 hours Monitor daily aPTT, HL, CBC/plt Monitor for signs/symptoms of bleeding  F/u restart apixaban after procedures    Thanks for allowing pharmacy to be a part of this patient's care.  Antonietta Jewel, PharmD, [place] Clinical Pharmacist  Phone: [phone] 06/18/2020 1:22 PM  Please check AMION for all [place] phone numbers After 10:00 PM, call [place] [phone]

## 2020-06-18 NOTE — Progress Notes (Signed)
ANTICOAGULATION CONSULT NOTE - Follow Up Consult  Pharmacy Consult for heparin Indication: atrial fibrillation   Labs: Recent Labs    06/16/20 0251 06/16/20 1136 06/17/20 0338 06/17/20 0547 06/17/20 0734 06/17/20 1430 06/17/20 1823 06/17/20 2148 06/18/20 0128  HGB 10.2*  --   --  9.6* 9.2*  --   --   --   --   HCT 33.6*  --   --  31.6* 31.0*  --   --   --   --   PLT 317  --   --  269 274  --   --   --   --   APTT 121*   < > 107*  --   --  106*  --   --  62*  HEPARINUNFRC >1.10*  --  >1.10*  --   --   --   --   --   --   CREATININE 2.71*   < >  --   --  2.98*  --  2.56* 2.43*  --    < > = values in this interval not displayed.    Assessment: 80yo male now slightly subtherapeutic on heparin after rate change.  Goal of Therapy:  aPTT 66-102 seconds   Plan:  Will increase heparin gtt slightly to 750 units/hr and check PTT in 8 hours.    Wynona Neat, PharmD, BCPS  06/18/2020,2:26 AM

## 2020-06-19 ENCOUNTER — Inpatient Hospital Stay: Payer: Self-pay

## 2020-06-19 ENCOUNTER — Inpatient Hospital Stay (HOSPITAL_COMMUNITY): Payer: Medicare Other

## 2020-06-19 DIAGNOSIS — I509 Heart failure, unspecified: Secondary | ICD-10-CM | POA: Diagnosis not present

## 2020-06-19 DIAGNOSIS — N179 Acute kidney failure, unspecified: Secondary | ICD-10-CM | POA: Diagnosis not present

## 2020-06-19 DIAGNOSIS — R531 Weakness: Secondary | ICD-10-CM | POA: Diagnosis not present

## 2020-06-19 DIAGNOSIS — I4819 Other persistent atrial fibrillation: Secondary | ICD-10-CM | POA: Diagnosis not present

## 2020-06-19 DIAGNOSIS — R0602 Shortness of breath: Secondary | ICD-10-CM | POA: Diagnosis not present

## 2020-06-19 DIAGNOSIS — R570 Cardiogenic shock: Secondary | ICD-10-CM | POA: Diagnosis not present

## 2020-06-19 LAB — APTT
aPTT: 62 seconds — ABNORMAL HIGH (ref 24–36)
aPTT: 55 seconds — ABNORMAL HIGH (ref 24–36)

## 2020-06-19 LAB — BLOOD CULTURE ID PANEL (REFLEXED) - BCID2

## 2020-06-19 LAB — CBC
HCT: 27.8 % — ABNORMAL LOW (ref 39.0–52.0)
Hemoglobin: 8.7 g/dL — ABNORMAL LOW (ref 13.0–17.0)
MCH: 24 pg — ABNORMAL LOW (ref 26.0–34.0)
MCHC: 31.3 g/dL (ref 30.0–36.0)
MCV: 76.8 fL — ABNORMAL LOW (ref 80.0–100.0)
Platelets: 231 10*3/uL (ref 150–400)
RBC: 3.62 MIL/uL — ABNORMAL LOW (ref 4.22–5.81)
RDW: 19.3 % — ABNORMAL HIGH (ref 11.5–15.5)
WBC: 15.8 10*3/uL — ABNORMAL HIGH (ref 4.0–10.5)
nRBC: 0 % (ref 0.0–0.2)

## 2020-06-19 LAB — COOXEMETRY PANEL
Carboxyhemoglobin: 1.6 % — ABNORMAL HIGH (ref 0.5–1.5)
Methemoglobin: 1 % (ref 0.0–1.5)
O2 Saturation: 66.4 %
Total hemoglobin: 8.8 g/dL — ABNORMAL LOW (ref 12.0–16.0)

## 2020-06-19 LAB — COMPREHENSIVE METABOLIC PANEL
ALT: 691 U/L — ABNORMAL HIGH (ref 0–44)
AST: 198 U/L — ABNORMAL HIGH (ref 15–41)
Albumin: 2 g/dL — ABNORMAL LOW (ref 3.5–5.0)
Alkaline Phosphatase: 110 U/L (ref 38–126)
Anion gap: 12 (ref 5–15)
BUN: 61 mg/dL — ABNORMAL HIGH (ref 8–23)
CO2: 30 mmol/L (ref 22–32)
Calcium: 7.8 mg/dL — ABNORMAL LOW (ref 8.9–10.3)
Chloride: 87 mmol/L — ABNORMAL LOW (ref 98–111)
Creatinine, Ser: 1.75 mg/dL — ABNORMAL HIGH (ref 0.61–1.24)
GFR, Estimated: 38 mL/min — ABNORMAL LOW (ref >60)
Glucose, Bld: 183 mg/dL — ABNORMAL HIGH (ref 70–99)
Potassium: 3.1 mmol/L — ABNORMAL LOW (ref 3.5–5.1)
Sodium: 129 mmol/L — ABNORMAL LOW (ref 135–145)
Total Bilirubin: 1.7 mg/dL — ABNORMAL HIGH (ref 0.3–1.2)
Total Protein: 5.2 g/dL — ABNORMAL LOW (ref 6.5–8.1)

## 2020-06-19 LAB — GLUCOSE, CAPILLARY
Glucose-Capillary: 255 mg/dL — ABNORMAL HIGH (ref 70–99)
Glucose-Capillary: 307 mg/dL — ABNORMAL HIGH (ref 70–99)
Glucose-Capillary: 164 mg/dL — ABNORMAL HIGH (ref 70–99)
Glucose-Capillary: 367 mg/dL — ABNORMAL HIGH (ref 70–99)
Glucose-Capillary: 278 mg/dL — ABNORMAL HIGH (ref 70–99)
Glucose-Capillary: 247 mg/dL — ABNORMAL HIGH (ref 70–99)
Glucose-Capillary: 215 mg/dL — ABNORMAL HIGH (ref 70–99)

## 2020-06-19 LAB — HEPARIN LEVEL (UNFRACTIONATED): Heparin Unfractionated: >1.1 IU/mL — ABNORMAL HIGH (ref 0.30–0.70)

## 2020-06-19 LAB — MAGNESIUM: Magnesium: 1.9 mg/dL (ref 1.7–2.4)

## 2020-06-19 MED ORDER — POTASSIUM CHLORIDE CRYS ER 20 MEQ PO TBCR
40.0000 meq | EXTENDED_RELEASE_TABLET | Freq: Once | ORAL | Status: AC
  Administered 2020-06-19: 40 meq via ORAL
  Filled 2020-06-19: qty 2

## 2020-06-19 MED ORDER — SODIUM CHLORIDE 0.9% FLUSH
10.0000 mL | Freq: Two times a day (BID) | INTRAVENOUS | Status: DC
  Administered 2020-06-19 – 2020-06-27 (×15): 10 mL
  Administered 2020-06-27 – 2020-06-29 (×3): 40 mL
  Administered 2020-06-30 – 2020-07-03 (×7): 10 mL
  Administered 2020-07-04: 30 mL
  Administered 2020-07-04 – 2020-07-05 (×3): 10 mL
  Administered 2020-07-06: 30 mL

## 2020-06-19 MED ORDER — SODIUM CHLORIDE 0.9% FLUSH: 10.0000 mL | INTRAVENOUS | Status: DC | PRN

## 2020-06-19 MED ORDER — SODIUM CHLORIDE 0.9 % IV SOLN
2.0000 g | Freq: Two times a day (BID) | INTRAVENOUS | Status: DC
  Administered 2020-06-19: 2 g via INTRAVENOUS
  Filled 2020-06-19: qty 2

## 2020-06-19 MED ORDER — AMIODARONE LOAD VIA INFUSION
150.0000 mg | Freq: Once | INTRAVENOUS | Status: AC
  Administered 2020-06-19: 150 mg via INTRAVENOUS
  Filled 2020-06-19: qty 83.34

## 2020-06-19 MED ORDER — SODIUM CHLORIDE 0.9 % IV SOLN
2.0000 g | INTRAVENOUS | Status: AC
  Administered 2020-06-19 – 2020-06-25 (×7): 2 g via INTRAVENOUS
  Filled 2020-06-19: qty 2
  Filled 2020-06-19 (×2): qty 20
  Filled 2020-06-19 (×2): qty 2
  Filled 2020-06-19 (×2): qty 20

## 2020-06-19 MED ORDER — MAGNESIUM SULFATE IN D5W 1-5 GM/100ML-% IV SOLN
1.0000 g | Freq: Once | INTRAVENOUS | Status: AC
  Administered 2020-06-19: 1 g via INTRAVENOUS
  Filled 2020-06-19: qty 100

## 2020-06-19 NOTE — Progress Notes (Signed)
PHARMACY - PHYSICIAN COMMUNICATION CRITICAL VALUE ALERT - BLOOD CULTURE IDENTIFICATION (BCID)  Wesley Winters is an 80 y.o. male who presented to [facility] on 06/14/2020 with a chief complaint of weakness and fatigue.   Assessment:  80 year old male admitted since 6/11 with acute on chronic kidney disease stage IV. On 6/15 he developed a fever of 101.1. Blood cultures are now showing 1/4 bottles positive for proteus species. Source potentially urine vs. Foot ulcers.   Name of physician (or Provider) Contacted: [facility] Also informed HF pharmacist   Current antibiotics: Cefepime 2 gm IV Q 12 hours   Changes to prescribed antibiotics recommended:  Optimize antibiotics to ceftriaxone 2 gm IV q 24 hours to target proteus   Results for orders placed or performed during the hospital encounter of 06/14/20  Blood Culture ID Panel (Reflexed) (Collected: 06/18/2020  9:42 AM)  Result Value Ref Range   Enterococcus faecalis NOT DETECTED NOT DETECTED   Enterococcus Faecium NOT DETECTED NOT DETECTED   Listeria monocytogenes NOT DETECTED NOT DETECTED   Staphylococcus species NOT DETECTED NOT DETECTED   Staphylococcus aureus (BCID) NOT DETECTED NOT DETECTED   Staphylococcus epidermidis NOT DETECTED NOT DETECTED   Staphylococcus lugdunensis NOT DETECTED NOT DETECTED   Streptococcus species NOT DETECTED NOT DETECTED   Streptococcus agalactiae NOT DETECTED NOT DETECTED   Streptococcus pneumoniae NOT DETECTED NOT DETECTED   Streptococcus pyogenes NOT DETECTED NOT DETECTED   A.calcoaceticus-baumannii NOT DETECTED NOT DETECTED   Bacteroides fragilis NOT DETECTED NOT DETECTED   Enterobacterales DETECTED (A) NOT DETECTED   Enterobacter cloacae complex NOT DETECTED NOT DETECTED   Escherichia coli NOT DETECTED NOT DETECTED   Klebsiella aerogenes NOT DETECTED NOT DETECTED   Klebsiella oxytoca NOT DETECTED NOT DETECTED   Klebsiella pneumoniae NOT DETECTED NOT DETECTED   Proteus species DETECTED (A) NOT  DETECTED   Salmonella species NOT DETECTED NOT DETECTED   Serratia marcescens NOT DETECTED NOT DETECTED   Haemophilus influenzae NOT DETECTED NOT DETECTED   Neisseria meningitidis NOT DETECTED NOT DETECTED   Pseudomonas aeruginosa NOT DETECTED NOT DETECTED   Stenotrophomonas maltophilia NOT DETECTED NOT DETECTED   Candida albicans NOT DETECTED NOT DETECTED   Candida auris NOT DETECTED NOT DETECTED   Candida glabrata NOT DETECTED NOT DETECTED   Candida krusei NOT DETECTED NOT DETECTED   Candida parapsilosis NOT DETECTED NOT DETECTED   Candida tropicalis NOT DETECTED NOT DETECTED   Cryptococcus neoformans/gattii NOT DETECTED NOT DETECTED   CTX-M ESBL NOT DETECTED NOT DETECTED   Carbapenem resistance IMP NOT DETECTED NOT DETECTED   Carbapenem resistance KPC NOT DETECTED NOT DETECTED   Carbapenem resistance NDM NOT DETECTED NOT DETECTED   Carbapenem resist OXA 48 LIKE NOT DETECTED NOT DETECTED   Carbapenem resistance VIM NOT DETECTED NOT DETECTED    Jimmy Footman, PharmD, BCPS, BCIDP Infectious Diseases Clinical Pharmacist Phone: [phone] 06/19/2020  10:56 AM

## 2020-06-19 NOTE — Progress Notes (Signed)
[place] for heparin Indication: atrial fibrillation  Labs: Recent Labs    06/17/20 0338 06/17/20 0547 06/17/20 0734 06/17/20 1430 06/17/20 2148 06/18/20 0128 06/18/20 0443 06/18/20 1035 06/18/20 2037 06/19/20 0421  HGB  --    < > 9.2*  --   --   --  9.5*  --   --  8.7*  HCT  --    < > 31.0*  --   --   --  30.5*  --   --  27.8*  PLT  --    < > 274  --   --   --  256  --   --  231  APTT 107*  --   --    < >  --  62*  --  66* 76* 62*  HEPARINUNFRC >1.10*  --   --   --   --  >1.10*  --   --   --  >1.10*  CREATININE  --   --  2.98*   < > 2.43*  --  2.30*  --   --  1.75*   < > = values in this interval not displayed.      Assessment: 80 yo M on apixaban PTA for afib now holding for cardiogenic shock. Last dose of apixaban 6/12 at 10am. Pharmacy consulted for heparin.   aPTT came back subtherapeutic at 62 - drawn central line, heparin running peripheral. Hgb 8.7, plt 231. No s/sx of bleeding outside of hematuria (which is improving, monitoring closely) or infusion issues.   Goal of Therapy:  Heparin level 0.3-0.7 units/ml aPTT 66-102 seconds Monitor platelets by anticoagulation protocol: Yes   Plan:  Increase heparin infusion to 800 units/hr  F/u aPTT in 8 hours Monitor daily aPTT, HL, CBC/plt Monitor for signs/symptoms of bleeding  F/u restart apixaban after procedures    Thanks for allowing pharmacy to be a part of this patient's care.  Antonietta Jewel, PharmD, [place] Clinical Pharmacist  Phone: [phone] 06/19/2020 7:18 AM  Please check AMION for all [place] phone numbers After 10:00 PM, call [place] [phone]

## 2020-06-19 NOTE — Progress Notes (Signed)
R IJ CVC was dislodged during transfer from chair to bed. Continuous IV meds moved from central line to PIVs temporarily. Spoke w/ Dr Haroldine Laws at bedside regarding loss of central access, received verbal orders for PICC placement. Pressure held on IJ site for 5 minutes, hemostasis achieved. IJ site dressed w/ vaseline gauze and gauze/tape dressing.

## 2020-06-19 NOTE — Progress Notes (Signed)
CSW attempted to visit the patient at bedside to discuss SNF workup per PT recommendation however the patient was in the middle of a procedure and unable to engage in conversation at this time.   CSW will continue to follow for discharge needs and will check back with the patient at another time to address SNF workup.  Wesley Winters, MSW, [place] Heart Failure Social Worker

## 2020-06-19 NOTE — Progress Notes (Signed)
[place] for heparin Indication: atrial fibrillation  Labs: Recent Labs    06/17/20 0338 06/17/20 0547 06/17/20 0734 06/17/20 1430 06/17/20 2148 06/18/20 0128 06/18/20 0443 06/18/20 1035 06/18/20 2037 06/19/20 0421 06/19/20 1600  HGB  --    < > 9.2*  --   --   --  9.5*  --   --  8.7*  --   HCT  --    < > 31.0*  --   --   --  30.5*  --   --  27.8*  --   PLT  --    < > 274  --   --   --  256  --   --  231  --   APTT 107*  --   --    < >  --  62*  --    < > 76* 62* 55*  HEPARINUNFRC >1.10*  --   --   --   --  >1.10*  --   --   --  >1.10*  --   CREATININE  --   --  2.98*   < > 2.43*  --  2.30*  --   --  1.75*  --    < > = values in this interval not displayed.     Assessment: 80 yo M on apixaban PTA for afib now holding for cardiogenic shock. Last dose of apixaban 6/12 at 10am. Pharmacy consulted for heparin.   aPTT trending down at 55  - drawn central line, heparin running peripheral.  Hgb 8.7, plt 231 this AM. Per RN, earlier RN was concerned for possible infiltration - no signs currently, only some minor bruising at site.  No s/sx of bleeding outside of hematuria (which is improving, monitoring closely).  Goal of Therapy:  Heparin level 0.3-0.7 units/ml aPTT 66-102 seconds Monitor platelets by anticoagulation protocol: Yes   Plan:  Increase heparin infusion to 850 units/hr  F/u aPTT in 8 hours Monitor daily aPTT, HL, CBC/plt Monitor for signs/symptoms of bleeding  F/u restart apixaban after procedures    Thanks for allowing pharmacy to be a part of this patient's care.  Sloan Leiter, PharmD, BCPS, BCCCP Clinical Pharmacist Please refer to [facility] for [place] numbers 06/19/2020 6:12 PM  Please check AMION for all [place] phone numbers After 10:00 PM, call [place] [phone]

## 2020-06-19 NOTE — Progress Notes (Signed)
Peripherally Inserted Central Catheter Placement  The IV Nurse has discussed with the patient and/or persons authorized to consent for the patient, the purpose of this procedure and the potential benefits and risks involved with this procedure.  The benefits include less needle sticks, lab draws from the catheter, and the patient may be discharged home with the catheter. Risks include, but not limited to, infection, bleeding, blood clot (thrombus formation), and puncture of an artery; nerve damage and irregular heartbeat and possibility to perform a PICC exchange if needed/ordered by physician.  Alternatives to this procedure were also discussed.  Bard Power PICC patient education guide, fact sheet on infection prevention and patient information card has been provided to patient /or left at bedside.    PICC Placement Documentation  PICC Triple Lumen 06/19/20 PICC Left Brachial 44 cm 0 cm (Active)  Indication for Insertion or Continuance of Line Vasoactive infusions 06/19/20 1536  Exposed Catheter (cm) 0 cm 06/19/20 1536  Site Assessment Clean;Dry;Intact 06/19/20 1536  Lumen #1 Status Flushed;Saline locked;Blood return noted 06/19/20 1536  Lumen #2 Status Flushed;Saline locked;Blood return noted 06/19/20 1536  Lumen #3 Status Flushed;Saline locked;Blood return noted 06/19/20 1536  Dressing Type Transparent 06/19/20 1536  Dressing Status Clean;Dry;Intact 06/19/20 1536  Antimicrobial disc in place? Yes 06/19/20 [place] checked and tightened 06/19/20 1536  Line Adjustment (NICU/IV Team Only) No 06/19/20 1536  Dressing Intervention New dressing 06/19/20 1536  Dressing Change Due 06/26/20 06/19/20 [place], Nicolette Bang 06/19/2020, 3:38 PM

## 2020-06-19 NOTE — Progress Notes (Signed)
Patient ID: Wesley Winters, male   DOB: [birth year], 80 y.o.   MRN: [id number]    Progress Note from the Palliative Medicine Team at [facility]   Patient Name: Wesley Winters        Date: 06/19/2020 DOB: 05-Jul-1940  Age: 80 y.o. MRN#: [id number] Attending Physician: Ezekiel Slocumb, DO Primary Care Physician: [name], MD Admit Date: 06/14/2020   Medical records reviewed   80 y.o. male   admitted on 06/14/2020 with history of cardiomyopathy with EF of 25% likely tachycardia related with history of A. fib, CAD status post stenting, diabetes mellitus was recently admitted for CHF and also underwent cardioversion discharged on Jun 03, 2020 about 10 days ago has been having poor appetite and weakness, and three reported falls per his wife.   Complaining of the weakness, patient was brought to the ER.  Patient reports poor appetite and nausea,  denies any abdominal pain.     In the ER patient was found to have intermittent episodes of A. fib with RVR labs are significant for creatinine worsening from 1.2 about 4 days ago and it is around 2.6.  LFTs are elevated at AST of 110 ALT of 87 total bilirubin 1.2.  CBC is around baseline.  Patient was given to 50 cc normal saline bolus given that patient had acute renal failure.  Abdomen appears benign.  COVID test was negative.  Patient underwent CT head and CT chest abdomen pelvis which does not show any acute.  Admitted for treatment and stabilization.  Currently on Milrinone, Amiodarone, Heparin   He reports weakness.  Patient and family face treatment option decisions, advanced directive decisions and anticipatory care needs.   This NP visited patient at the bedside as a follow up to  yesterday's [place].  Patient is alert and oriented, his wife at the bedside  Continue education offered regarding current medical situation.  Patient and his wife voice concern regarding anticipatory care needs into the future.  Wife is adamant that she can take  care of him at home.  Transition of care options depends on outcomes and wishes for life prolonging measures.  Both continue to endorse comfort and dignity at home is a priority.  I offered to call daughter who has returned to her home in [place] for a few days but Mrs. Potenza tells me that she will speak to her herself.   Discussed with patient the importance of continued conversation with his family and the  medical providers regarding overall plan of care and treatment options,  ensuring decisions are within the context of the patients values and GOCs.  Questions and concerns addressed   PMT will continue to support holistically   Total time spent on the unit was 35 minutes   This nurse practitioner informed  the patient/family that I will be out of the hospital until Monday morning.  If the patient is still hospitalized I will follow-up at that time.  Call palliative medicine team phone # [phone] with questions or concerns in the interim.   Greater than 50% of the time was spent in counseling and coordination of care  [name] NP  Palliative Medicine Team Team Phone # [phone] Pager [phone]

## 2020-06-19 NOTE — Progress Notes (Signed)
Inpatient Diabetes Program Recommendations  AACE/ADA: New Consensus Statement on Inpatient Glycemic Control (2015)  Target Ranges:  Prepandial:   less than 140 mg/dL      Peak postprandial:   less than 180 mg/dL (1-2 hours)      Critically ill patients:  140 - 180 mg/dL   Lab Results  Component Value Date   GLUCAP 278 (H) 06/19/2020   HGBA1C 7.5 (H) 05/26/2020    Review of Glycemic Control Results for Wesley Winters, Wesley Winters (MRN [id number]) as of 06/19/2020 13:14  Ref. Range 06/18/2020 11:17 06/18/2020 16:24 06/18/2020 21:46 06/19/2020 06:22 06/19/2020 11:26  Glucose-Capillary Latest Ref Range: 70 - 99 mg/dL 250 (H) 255 (H) 307 (H) 164 (H) 278 (H)  Diabetes history: DM2 Outpatient Diabetes medications: Toujeo 25 units QHS, Metformin XR 1000 mg BID Current orders for Inpatient glycemic control: Lantus 10 units QHS, Novolog 0-9 units TID with meals   Inpatient Diabetes Program Recommendations:    Consider increasing Lantus to 15 units q HS.  Also appears to need Novolog meal coverage 2 units tid with meals (hold if patient eats less than 50% or NPO).   Thanks  Adah Perl, RN, BC-ADM Inpatient Diabetes Coordinator Pager [phone]

## 2020-06-19 NOTE — Progress Notes (Addendum)
Advanced Heart Failure Rounding Note  PCP-Cardiologist: Sinclair Grooms, MD   Subjective:   Cardiogenic shock. New onset AF.   6/12 CO-OX 43% --->Started on milrinone 0.25 mcg.  6/13 Milrinone increased to 0.375. Co-ox 58% today.  6/15 Back in Afib w/ RVR, amio rebolused, rate increased to 60  6/15 Fever spike + leukocytosis + hypotension>>started on empiric cefepime + NE  PCT 2.72. CXR no focal infiltrate. BCx pending. Foley pulled but re-inserted due to urinary retention. Diuretics held again for low CVP  AF overnight. WBC stable at 15K  Wt stable. SCr continues to improve, 2.3>>1.8. CVP 8-9   Remains in Afib, though rate improved, low 100s.   On NE 5 + milrinone 0.375. Co-ox 66%.   Overall, looks and feels better today. Appetite is good.   Objective:   Weight Range: 92.2 kg Body mass index is 30.91 kg/m.   Vital Signs:   Temp:  [98.3 F (36.8 C)-101.1 F (38.4 C)] 98.5 F (36.9 C) (06/16 0630) Pulse Rate:  [42-140] 94 (06/16 0700) Resp:  [12-32] 23 (06/16 0700) BP: (68-133)/(45-110) 121/68 (06/16 0700) SpO2:  [88 %-100 %] 100 % (06/16 0700) Weight:  [92.2 kg] 92.2 kg (06/16 0500) Last BM Date: 06/18/20  Weight change: Filed Weights   06/17/20 0600 06/18/20 0500 06/19/20 0500  Weight: 94.1 kg 92.3 kg 92.2 kg    Intake/Output:   Intake/Output Summary (Last 24 hours) at 06/19/2020 0717 Last data filed at 06/19/2020 0600 Gross per 24 hour  Intake 1899.21 ml  Output 2460 ml  Net -560.79 ml      Physical Exam   CVP 8-9  General:  Well appearing elderly WM sitting up in bed. No respiratory difficulty HEENT: normal Neck: supple. JVD 8 cm, + Rt IJ CVC Carotids 2+ bilat; no bruits. No lymphadenopathy or thyromegaly appreciated. Cor: PMI nondisplaced. Irregularly irregular rhythm, mildly tachy rate. No rubs, gallops or murmurs. Lungs: clear Abdomen: soft, nontender, nondistended. No hepatosplenomegaly. No bruits or masses. Good bowel  sounds. Extremities: no cyanosis, clubbing, rash, edema + ted hoses  Neuro: alert & oriented x 3, cranial nerves grossly intact. moves all 4 extremities w/o difficulty. Affect pleasant. GU: + Foley    Telemetry   Afib w/ RVR low 100s-110s  EKG    N/A  Labs    CBC Recent Labs    06/18/20 0443 06/19/20 0421  WBC 15.1* 15.8*  HGB 9.5* 8.7*  HCT 30.5* 27.8*  MCV 76.8* 76.8*  PLT 256 175   Basic Metabolic Panel Recent Labs    06/18/20 0443 06/19/20 0421  NA 129* 129*  K 3.6 3.1*  CL 88* 87*  CO2 30 30  GLUCOSE 161* 183*  BUN 75* 61*  CREATININE 2.30* 1.75*  CALCIUM 7.7* 7.8*  MG 2.1 1.9   Liver Function Tests Recent Labs    06/18/20 0443 06/19/20 0421  AST 666* 198*  ALT 1,112* 691*  ALKPHOS 94 110  BILITOT 2.1* 1.7*  PROT 5.6* 5.2*  ALBUMIN 2.4* 2.0*   No results for input(s): LIPASE, AMYLASE in the last 72 hours. Cardiac Enzymes No results for input(s): CKTOTAL, CKMB, CKMBINDEX, TROPONINI in the last 72 hours.  BNP: BNP (last 3 results) Recent Labs    05/26/20 1429 06/15/20 0729  BNP 1,609.8* 2,424.4*    ProBNP (last 3 results) Recent Labs    07/11/19 1107 04/23/20 1446  PROBNP 445 2,488*     D-Dimer No results for input(s): DDIMER in the last 72  hours. Hemoglobin A1C No results for input(s): HGBA1C in the last 72 hours. Fasting Lipid Panel No results for input(s): CHOL, HDL, LDLCALC, TRIG, CHOLHDL, LDLDIRECT in the last 72 hours. Thyroid Function Tests No results for input(s): TSH, T4TOTAL, T3FREE, THYROIDAB in the last 72 hours.  Invalid input(s): FREET3   Other results:   Imaging    DG CHEST PORT 1 VIEW  Result Date: 06/18/2020 CLINICAL DATA:  Fever R/o any infectious process EXAM: PORTABLE CHEST - 1 VIEW COMPARISON:  06/15/2020 FINDINGS: Relatively low lung volumes. No focal infiltrate or overt edema. Stable right IJ central line to the cavoatrial junction. Stable cardiomegaly.  Aortic Atherosclerosis (ICD10-170.0).  There is blunting of the left lateral costophrenic angle as before. No pneumothorax. Right acromioclavicular spurring. Regional bones otherwise unremarkable. IMPRESSION: Stable cardiomegaly.  No acute disease. Electronically Signed   By: Lucrezia Europe M.D.   On: 06/18/2020 10:40     Medications:     Scheduled Medications:  Chlorhexidine Gluconate Cloth  6 each Topical Q0600   insulin aspart  0-9 Units Subcutaneous TID WC   insulin glargine  10 Units Subcutaneous QHS   mupirocin ointment   Topical Q12H   tamsulosin  0.4 mg Oral QPC supper   vitamin B-12  1,000 mcg Oral QHS    Infusions:  sodium chloride Stopped (06/18/20 1428)   amiodarone 60 mg/hr (06/19/20 0648)   ceFEPime (MAXIPIME) IV Stopped (06/18/20 1421)   heparin 750 Units/hr (06/19/20 5277)   milrinone 0.375 mcg/kg/min (06/19/20 0600)   norepinephrine (LEVOPHED) Adult infusion 5 mcg/min (06/19/20 0600)    PRN Medications:   Assessment/Plan  Acute systolic HF -> 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 NE now off.  - watch closely for need for mechanical support - doubt ischemic as hstrop low despite profound hemodynamic support but given h/p CAD and multiple CRFs will need ischemic eval once more stable and renal function improves - 6/12 CO-OX 43.5%. Started on milrinone 0.25 mcg. Increased milrinone to 0.375 mcg. NE added for hypotension, currently on 5 mcg. Co-ox 66% today  - CVP 8. Continue to hold diuretics today  - BP soft. Would hold off on adding hydralazine/imdur with AKI (avoid hypotension for renal perfusion)     2. CAD - s/p previous LAD stenting - doubt ischemia is major factor in HF given low hsTrop - plan as above - continue ASA - hold statin with shock liver   3. AF with RVR - rates improved today -  continue amio gtt at 60/hr  - Eliquis stopped 6/12 and switched to heparin.  - Continue heparin drip. Watch with some hematuria.  - DC-CV this admission   4. AKI - due to ATN and shock - improving with hemodynamic support - Creatinine trending down 2.95>2.7>>2.4>>2.3>1.8   5. Shock liver - due to shock, improving  - supportive care   6. DM2 - SSI    7. Hypokalemia  - K 3.1  - supp KCl   8. Code status - he was made DNR/DNI but was pretty functional prior to developing HF from AF. I think he has a likely reversible cause for his deterioration. - Dr Haroldine Laws discussed with him and will be Full Code for now.   9. ID:  - PCT 2.7. CXR w/o infiltrate. Foley replaced . BCx  pending - Continue cefepime, currently AF. WBC stable   10. Wound on left foot - deep but clean based - WC recs bid dressing changes    Length of Stay: 5  Brittainy Simmons, PA-C  06/19/2020, 7:17 AM  Advanced Heart Failure Team Pager [phone] (M-F; 7a - 5p)  Please contact [facility] for night-coverage after hours (5p -7a ) and weekends on [email or web address]    Agree  Developed early sepsis yesterday. Now much improved. Foley removed but had to be replaced due to retention. On cefepime.   Remains on NE 5 and milrinone 0.25. Co-ox 66% CVP 8. Central line pulled out this am  Remains in AF with RVR on IV amio at 60 and heparin.  General:  Sitting up in chair No resp difficulty HEENT: normal Neck: supple. JVP 8. Carotids 2+ bilat; no bruits. No lymphadenopathy or thryomegaly appreciated. Cor: PMI nondisplaced. Irreg tachy No rubs, gallops or murmurs. Lungs: clear Abdomen: soft, nontender, nondistended. No hepatosplenomegaly. No bruits or masses. Good bowel sounds. Extremities: no cyanosis, clubbing, rash, edema + foley  Neuro: alert & orientedx3, cranial nerves grossly intact. moves all 4 extremities w/o difficulty. Affect pleasant  Sepsis improving. Liver and kidney function improving. Remains in AF.    Plan: Continue cefepime. F/u bcx Continue milrinone. Wean NE as tolerated Continue IV amio and heparin Scheduled for TEE/DC-CV tomorrow at [address]p Place PICC  CRITICAL CARE Performed by: Glori Bickers  Total critical care time: 45 minutes  Critical care time was exclusive of separately billable procedures and treating other patients.  Critical care was necessary to treat or prevent imminent or life-threatening deterioration.  Critical care was time spent personally by me (independent of midlevel providers or residents) on the following activities: development of treatment plan with patient and/or surrogate as well as nursing, discussions with consultants, evaluation of patient's response to treatment, examination of patient, obtaining history from patient or surrogate, ordering and performing treatments and interventions, ordering and review of laboratory studies, ordering and review of radiographic studies, pulse oximetry and re-evaluation of patient's condition.  Glori Bickers, MD  1:19 PM

## 2020-06-19 NOTE — Plan of Care (Signed)

## 2020-06-19 NOTE — Progress Notes (Signed)
Physical Therapy Treatment Patient Details Name: Wesley Winters MRN: [id number] DOB: 28-Nov-1940 Today's Date: 06/19/2020    History of Present Illness 80 yo admitted 6/11 after 2 falls at home with fatigue and weakness with ARF. PMHx; HF, cardiomyopathy with EF 25%, Afib, CAD, DM, HTN, recent admit 5/23-5/31    PT Comments    Pt pleasant and reports rough day yesterday unable to even stand. Pt with decreased gait tolerance today and able to walk to door with chair follow but required seated rest before return attempt. Pt today able to recognize deficits and need for assist at D/C with return home not appropriate at this level and agreeable to ST-SNF. Pt educated for transfers, HEP and progression.   HR 100-130 with gait SpO2 100% on RA Pre gait 110/60(73) Post 94/61 (70)    Follow Up Recommendations  SNF;Supervision for mobility/OOB     Equipment Recommendations  Rolling walker with 5" wheels;3in1 (PT)    Recommendations for Other Services OT consult     Precautions / Restrictions Precautions Precautions: Fall Restrictions Weight Bearing Restrictions: No    Mobility  Bed Mobility Overal bed mobility: Needs Assistance Bed Mobility: Supine to Sit     Supine to sit: Min assist     General bed mobility comments: min assist to fully elevate trunk from surface with HOB 10 degrees    Transfers Overall transfer level: Needs assistance   Transfers: Sit to/from Stand Sit to Stand: Min assist         General transfer comment: cues for hand placement with assist to rise from bed and recliner  Ambulation/Gait Ambulation/Gait assistance: Min assist;+2 safety/equipment Gait Distance (Feet): 14 Feet Assistive device: Rolling walker (2 wheeled) Gait Pattern/deviations: Step-through pattern;Decreased stride length;Trunk flexed   Gait velocity interpretation: 1.31 - 2.62 ft/sec, indicative of limited community ambulator General Gait Details: cues for posture, proximity to  RW, safety and chair follow. pt walked 14' x 2 trials with seated rest and HR up to 130 with limited gait   Stairs             Wheelchair Mobility    Modified Rankin (Stroke Patients Only)       Balance Overall balance assessment: Needs assistance Sitting-balance support: Feet supported;No upper extremity supported Sitting balance-Leahy Scale: Fair Sitting balance - Comments: EOB without support   Standing balance support: Bilateral upper extremity supported Standing balance-Leahy Scale: Poor Standing balance comment: RW for standing and gait                            Cognition Arousal/Alertness: Awake/alert Behavior During Therapy: WFL for tasks assessed/performed Overall Cognitive Status: Impaired/Different from baseline Area of Impairment: Safety/judgement                         Safety/Judgement: Decreased awareness of deficits     General Comments: pt with improved recognition of deficits today but continues to lack full understanding of assist needed      Exercises General Exercises - Lower Extremity Long Arc Quad: AROM;Both;Seated;15 reps Hip Flexion/Marching: AROM;Both;Seated;15 reps    General Comments        Pertinent Vitals/Pain Pain Assessment: No/denies pain    Home Living                      Prior Function            PT Goals (current goals  can now be found in the care plan section) Progress towards PT goals: Progressing toward goals    Frequency    Min 3X/week      PT Plan Discharge plan needs to be updated    Co-evaluation              AM-PAC PT "6 Clicks" Mobility   Outcome Measure  Help needed turning from your back to your side while in a flat bed without using bedrails?: A Little Help needed moving from lying on your back to sitting on the side of a flat bed without using bedrails?: A Little Help needed moving to and from a bed to a chair (including a wheelchair)?: A Little Help  needed standing up from a chair using your arms (e.g., wheelchair or bedside chair)?: A Little Help needed to walk in hospital room?: A Lot Help needed climbing 3-5 steps with a railing? : Total 6 Click Score: 15    End of Session Equipment Utilized During Treatment: Gait belt Activity Tolerance: Patient tolerated treatment well Patient left: in chair;with call bell/phone within reach Nurse Communication: Mobility status PT Visit Diagnosis: Other abnormalities of gait and mobility (R26.89);Difficulty in walking, not elsewhere classified (R26.2)     Time: 1364-3837 PT Time Calculation (min) (ACUTE ONLY): 19 min  Charges:  $Gait Training: 8-22 mins                     Bayard Males, PT Acute Rehabilitation Services Pager: [phone] Office: [place] 06/19/2020, 10:18 AM

## 2020-06-20 ENCOUNTER — Inpatient Hospital Stay (HOSPITAL_COMMUNITY): Payer: Medicare Other | Admitting: Anesthesiology

## 2020-06-20 ENCOUNTER — Other Ambulatory Visit: Payer: Self-pay

## 2020-06-20 ENCOUNTER — Encounter (HOSPITAL_COMMUNITY): Payer: Self-pay | Admitting: Internal Medicine

## 2020-06-20 ENCOUNTER — Encounter (HOSPITAL_COMMUNITY)
Admission: EM | Disposition: A | Discharge status: Skilled Nursing Facility | Payer: Self-pay | Source: Home / Self Care | Attending: Internal Medicine

## 2020-06-20 ENCOUNTER — Inpatient Hospital Stay (HOSPITAL_COMMUNITY): Payer: Medicare Other

## 2020-06-20 DIAGNOSIS — I480 Paroxysmal atrial fibrillation: Secondary | ICD-10-CM

## 2020-06-20 DIAGNOSIS — I5021 Acute systolic (congestive) heart failure: Secondary | ICD-10-CM | POA: Diagnosis not present

## 2020-06-20 DIAGNOSIS — I361 Nonrheumatic tricuspid (valve) insufficiency: Secondary | ICD-10-CM

## 2020-06-20 DIAGNOSIS — I340 Nonrheumatic mitral (valve) insufficiency: Secondary | ICD-10-CM

## 2020-06-20 DIAGNOSIS — R570 Cardiogenic shock: Secondary | ICD-10-CM | POA: Diagnosis not present

## 2020-06-20 DIAGNOSIS — I4819 Other persistent atrial fibrillation: Secondary | ICD-10-CM | POA: Diagnosis not present

## 2020-06-20 HISTORY — PX: CARDIOVERSION: SHX1299

## 2020-06-20 HISTORY — PX: TEE WITHOUT CARDIOVERSION: SHX5443

## 2020-06-20 LAB — CBC
HCT: 27.5 % — ABNORMAL LOW (ref 39.0–52.0)
Hemoglobin: 8.5 g/dL — ABNORMAL LOW (ref 13.0–17.0)
MCH: 23.7 pg — ABNORMAL LOW (ref 26.0–34.0)
MCHC: 30.9 g/dL (ref 30.0–36.0)
MCV: 76.8 fL — ABNORMAL LOW (ref 80.0–100.0)
Platelets: 228 10*3/uL (ref 150–400)
RBC: 3.58 MIL/uL — ABNORMAL LOW (ref 4.22–5.81)
RDW: 19 % — ABNORMAL HIGH (ref 11.5–15.5)
WBC: 14.3 10*3/uL — ABNORMAL HIGH (ref 4.0–10.5)
nRBC: 0.3 % — ABNORMAL HIGH (ref 0.0–0.2)

## 2020-06-20 LAB — COMPREHENSIVE METABOLIC PANEL
ALT: 461 U/L — ABNORMAL HIGH (ref 0–44)
AST: 83 U/L — ABNORMAL HIGH (ref 15–41)
Albumin: 1.9 g/dL — ABNORMAL LOW (ref 3.5–5.0)
Alkaline Phosphatase: 116 U/L (ref 38–126)
Anion gap: 9 (ref 5–15)
BUN: 45 mg/dL — ABNORMAL HIGH (ref 8–23)
CO2: 27 mmol/L (ref 22–32)
Calcium: 7.3 mg/dL — ABNORMAL LOW (ref 8.9–10.3)
Chloride: 88 mmol/L — ABNORMAL LOW (ref 98–111)
Creatinine, Ser: 1.24 mg/dL (ref 0.61–1.24)
GFR, Estimated: 58 mL/min — ABNORMAL LOW (ref >60)
Glucose, Bld: 270 mg/dL — ABNORMAL HIGH (ref 70–99)
Potassium: 3.6 mmol/L (ref 3.5–5.1)
Sodium: 124 mmol/L — ABNORMAL LOW (ref 135–145)
Total Bilirubin: 1.3 mg/dL — ABNORMAL HIGH (ref 0.3–1.2)
Total Protein: 5.4 g/dL — ABNORMAL LOW (ref 6.5–8.1)

## 2020-06-20 LAB — BASIC METABOLIC PANEL
Anion gap: 10 (ref 5–15)
BUN: 37 mg/dL — ABNORMAL HIGH (ref 8–23)
CO2: 28 mmol/L (ref 22–32)
Calcium: 7.2 mg/dL — ABNORMAL LOW (ref 8.9–10.3)
Chloride: 87 mmol/L — ABNORMAL LOW (ref 98–111)
Creatinine, Ser: 1.17 mg/dL (ref 0.61–1.24)
GFR, Estimated: >60 mL/min (ref >60)
Glucose, Bld: 317 mg/dL — ABNORMAL HIGH (ref 70–99)
Potassium: 3.8 mmol/L (ref 3.5–5.1)
Sodium: 125 mmol/L — ABNORMAL LOW (ref 135–145)

## 2020-06-20 LAB — GLUCOSE, CAPILLARY
Glucose-Capillary: 180 mg/dL — ABNORMAL HIGH (ref 70–99)
Glucose-Capillary: 183 mg/dL — ABNORMAL HIGH (ref 70–99)
Glucose-Capillary: 197 mg/dL — ABNORMAL HIGH (ref 70–99)
Glucose-Capillary: 305 mg/dL — ABNORMAL HIGH (ref 70–99)

## 2020-06-20 LAB — APTT
aPTT: 69 seconds — ABNORMAL HIGH (ref 24–36)
aPTT: 49 seconds — ABNORMAL HIGH (ref 24–36)
aPTT: 45 seconds — ABNORMAL HIGH (ref 24–36)

## 2020-06-20 LAB — CULTURE, BLOOD (ROUTINE X 2)
Culture: NO GROWTH
Culture: NO GROWTH
Special Requests: ADEQUATE

## 2020-06-20 LAB — HEPARIN LEVEL (UNFRACTIONATED): Heparin Unfractionated: 0.68 IU/mL (ref 0.30–0.70)

## 2020-06-20 LAB — COOXEMETRY PANEL
Carboxyhemoglobin: 1.4 % (ref 0.5–1.5)
Methemoglobin: 0.7 % (ref 0.0–1.5)
O2 Saturation: 65.9 %
Total hemoglobin: 8.4 g/dL — ABNORMAL LOW (ref 12.0–16.0)

## 2020-06-20 LAB — MAGNESIUM: Magnesium: 1.9 mg/dL (ref 1.7–2.4)

## 2020-06-20 SURGERY — ECHOCARDIOGRAM, TRANSESOPHAGEAL
Anesthesia: General

## 2020-06-20 MED ORDER — PROPOFOL 500 MG/50ML IV EMUL
INTRAVENOUS | Status: DC | PRN
  Administered 2020-06-20: 100 ug/kg/min via INTRAVENOUS

## 2020-06-20 MED ORDER — LIDOCAINE VISCOUS HCL 2 % MT SOLN
OROMUCOSAL | Status: DC | PRN
  Administered 2020-06-20: 20 mL via OROMUCOSAL

## 2020-06-20 MED ORDER — PROPOFOL 10 MG/ML IV BOLUS
INTRAVENOUS | Status: DC | PRN
  Administered 2020-06-20: 10 mg via INTRAVENOUS

## 2020-06-20 MED ORDER — INSULIN ASPART 100 UNIT/ML IJ SOLN
0.0000 [IU] | Freq: Three times a day (TID) | INTRAMUSCULAR | Status: DC
  Administered 2020-06-20 – 2020-06-22 (×7): 3 [IU] via SUBCUTANEOUS
  Administered 2020-06-22 (×2): 2 [IU] via SUBCUTANEOUS
  Administered 2020-06-23: 5 [IU] via SUBCUTANEOUS
  Administered 2020-06-23 (×2): 2 [IU] via SUBCUTANEOUS
  Administered 2020-06-24: 3 [IU] via SUBCUTANEOUS
  Administered 2020-06-24 (×2): 2 [IU] via SUBCUTANEOUS
  Administered 2020-06-25 – 2020-06-26 (×4): 3 [IU] via SUBCUTANEOUS
  Administered 2020-06-26: 2 [IU] via SUBCUTANEOUS
  Administered 2020-06-26: 3 [IU] via SUBCUTANEOUS
  Administered 2020-06-27: 2 [IU] via SUBCUTANEOUS
  Administered 2020-06-27: 3 [IU] via SUBCUTANEOUS
  Administered 2020-06-27: 2 [IU] via SUBCUTANEOUS
  Administered 2020-06-28 (×2): 3 [IU] via SUBCUTANEOUS
  Administered 2020-06-29: 2 [IU] via SUBCUTANEOUS
  Administered 2020-06-29 (×2): 3 [IU] via SUBCUTANEOUS
  Administered 2020-06-30: 5 [IU] via SUBCUTANEOUS
  Administered 2020-06-30 – 2020-07-01 (×2): 2 [IU] via SUBCUTANEOUS
  Administered 2020-07-01 – 2020-07-04 (×6): 3 [IU] via SUBCUTANEOUS
  Administered 2020-07-05: 2 [IU] via SUBCUTANEOUS
  Administered 2020-07-05 – 2020-07-06 (×3): 3 [IU] via SUBCUTANEOUS
  Administered 2020-07-07 (×2): 2 [IU] via SUBCUTANEOUS
  Administered 2020-07-08 (×2): 3 [IU] via SUBCUTANEOUS
  Administered 2020-07-09: 1 [IU] via SUBCUTANEOUS
  Administered 2020-07-09: 3 [IU] via SUBCUTANEOUS

## 2020-06-20 MED ORDER — PHENYLEPHRINE 40 MCG/ML (10ML) SYRINGE FOR IV PUSH (FOR BLOOD PRESSURE SUPPORT)
PREFILLED_SYRINGE | INTRAVENOUS | Status: DC | PRN
  Administered 2020-06-20: 200 ug via INTRAVENOUS

## 2020-06-20 MED ORDER — MAGNESIUM SULFATE IN D5W 1-5 GM/100ML-% IV SOLN
1.0000 g | Freq: Once | INTRAVENOUS | Status: AC
  Administered 2020-06-20: 1 g via INTRAVENOUS
  Filled 2020-06-20: qty 100

## 2020-06-20 MED ORDER — INSULIN ASPART 100 UNIT/ML IJ SOLN
0.0000 [IU] | Freq: Every day | INTRAMUSCULAR | Status: DC
  Administered 2020-06-20: 4 [IU] via SUBCUTANEOUS
  Administered 2020-06-23 – 2020-06-30 (×3): 2 [IU] via SUBCUTANEOUS

## 2020-06-20 MED ORDER — POTASSIUM CHLORIDE 10 MEQ/50ML IV SOLN
10.0000 meq | INTRAVENOUS | Status: AC
  Administered 2020-06-20 (×4): 10 meq via INTRAVENOUS
  Filled 2020-06-20 (×3): qty 50

## 2020-06-20 MED ORDER — SODIUM CHLORIDE 0.9 % IV SOLN: INTRAVENOUS | Status: DC

## 2020-06-20 MED ORDER — TOLVAPTAN 15 MG PO TABS
15.0000 mg | ORAL_TABLET | Freq: Once | ORAL | Status: AC
  Administered 2020-06-20: 15 mg via ORAL
  Filled 2020-06-20: qty 1

## 2020-06-20 NOTE — Transfer of Care (Signed)
Immediate Anesthesia Transfer of Care Note  Patient: Wesley Winters  Procedure(s) Performed: TRANSESOPHAGEAL ECHOCARDIOGRAM (TEE) CARDIOVERSION  Patient Location: Endoscopy Unit  Anesthesia Type:MAC  Level of Consciousness: drowsy  Airway & Oxygen Therapy: Patient Spontanous Breathing  Post-op Assessment: Report given to RN and Post -op Vital signs reviewed and stable  Post vital signs: Reviewed and stable  Last Vitals:  Vitals Value Taken Time  BP 106/57 06/20/20 1457  Temp    Pulse 76 06/20/20 1459  Resp 14 06/20/20 1459  SpO2 100 % 06/20/20 1459  Vitals shown include unvalidated device data.  Last Pain:  Vitals:   06/20/20 1332  TempSrc: Oral  PainSc: 0-No pain      Patients Stated Pain Goal: 0 (33/58/25 1898)  Complications: No notable events documented.

## 2020-06-20 NOTE — Progress Notes (Signed)
  Echocardiogram Echocardiogram Transesophageal has been performed.  Wesley Winters 06/20/2020, 3:04 PM

## 2020-06-20 NOTE — Anesthesia Postprocedure Evaluation (Signed)
Anesthesia Post Note  Patient: Wesley Winters  Procedure(s) Performed: TRANSESOPHAGEAL ECHOCARDIOGRAM (TEE) CARDIOVERSION     Patient location during evaluation: PACU Anesthesia Type: General Level of consciousness: awake and alert and oriented Pain management: pain level controlled Vital Signs Assessment: post-procedure vital signs reviewed and stable Respiratory status: spontaneous breathing, nonlabored ventilation, respiratory function stable and patient connected to nasal cannula oxygen Cardiovascular status: blood pressure returned to baseline and stable Postop Assessment: no apparent nausea or vomiting Anesthetic complications: no   No notable events documented.  Last Vitals:  Vitals:   06/20/20 1457 06/20/20 1500  BP: (!) 106/57 108/60  Pulse: 73 77  Resp: 16 13  Temp: 36.6 C   SpO2: 100% 96%    Last Pain:  Vitals:   06/20/20 1457  TempSrc: Oral  PainSc:                  Zarek Relph A.

## 2020-06-20 NOTE — TOC Initial Note (Addendum)
Transition of Care (TOC) - Initial/Assessment Note  Heart Failure   Patient Details  Name: Wesley Winters MRN: [id number] Date of Birth: 1940/12/20  Transition of Care [facility]) CM/SW Contact:    Dick[place], [place] Phone Number: 06/20/2020, 10:36 AM  Clinical Narrative:                CSW spoke with the patient at bedside for possible SNF placement at time of discharge. Mr. Mcclanahan reported that he prefers not to go to a SNF if possible and would like to go home with his wife. CSW informed Mr. Brunsman he does have a choice but if he decides not to go to a SNF for rehab he could go home with home health but that would not be 24/7 supervision that the home health agency would only come out a few times a week for about an hour. Mr. Godbolt reported to be okay with that. Mr. Friday informed the CSW about how he is progressing with PT/OT and CSW provided encouragement. CSW asked Mr. Cullen about speaking to his wife and he reported that she will be by to visit later and CSW will come back to talk further with them both. No further questions reported at this time.     2:55pm - CSW spoke with patients wife Mrs. Carollee Leitz while the patient was at a procedure. Mrs. Zaremba reported that she would like Mr. Tierno to go to [facility] if possible and second choice would be [facility] but if neither offer a bed than they would prefer home with home health. CSW will start SNF workup.  CSW will continue to follow for d/c needs.   Expected Discharge Plan: Skilled Nursing Facility Barriers to Discharge: Continued Medical Work up   Patient Goals and CMS Choice Patient states their goals for this hospitalization and ongoing recovery are:: to return home CMS Medicare.gov Compare Post Acute Care list provided to:: Patient Choice offered to / list presented to : Patient  Expected Discharge Plan and Services Expected Discharge Plan: [place] In-house Referral: Clinical Social Work Discharge Planning Services:  CM Consult   Living arrangements for the past 2 months: [place]                                      Prior Living Arrangements/Services Living arrangements for the past 2 months: Single Family Home Lives with:: Self, Spouse Patient language and need for interpreter reviewed:: Yes Do you feel safe going back to the place where you live?: Yes      Need for Family Participation in Patient Care: No (Comment) Care giver support system in place?: No (comment)   Criminal Activity/Legal Involvement Pertinent to Current Situation/Hospitalization: No - Comment as needed  Activities of Daily Living Home Assistive Devices/Equipment: None ADL Screening (condition at time of admission) Patient's cognitive ability adequate to safely complete daily activities?: Yes Is the patient deaf or have difficulty hearing?: No Does the patient have difficulty seeing, even when wearing glasses/contacts?: No Does the patient have difficulty concentrating, remembering, or making decisions?: No Patient able to express need for assistance with ADLs?: Yes Does the patient have difficulty dressing or bathing?: No Independently performs ADLs?: Yes (appropriate for developmental age) Does the patient have difficulty walking or climbing stairs?: Yes Weakness of Legs: Both Weakness of Arms/Hands: None  Permission Sought/Granted  Emotional Assessment Appearance:: Appears stated age Attitude/Demeanor/Rapport: Engaged Affect (typically observed): Pleasant Orientation: : Oriented to Self, Oriented to Place, Oriented to  Time, Oriented to Situation   Psych Involvement: No (comment)  Admission diagnosis:  ARF (acute renal failure) ([place]) [N17.9] Weakness [R53.1] SOB (shortness of breath) [R06.02] AKI (acute kidney injury) ([place]) [N17.9] Patient Active Problem List   Diagnosis Date Noted   Cardiogenic shock ([place])    ARF (acute renal failure) ([place]) 06/14/2020    Hypomagnesemia 27/74/1287   Acute systolic heart failure ([place]) 05/26/2020   Persistent atrial fibrillation ([place]) 05/26/2020   Chronic anticoagulation 05/26/2020   CAD in native artery 02/22/2016   Hyperlipidemia LDL goal <70 02/22/2016   Malignant neoplasm of prostate ([place]) 11/04/2014   Diabetes ([place]) 11/04/2014   PCP:  [name], MD Pharmacy:   [facility] [place], [place] [address] Phone: [phone] Fax: [phone]  [place] [place] [address] Phone: [phone] Fax: [phone]  [facility] [address] [place] [address] Phone: [phone] Fax: [phone]     Social Determinants of Health (SDOH) Interventions    Readmission Risk Interventions No flowsheet data found.  Nilam Quakenbush, MSW, [place] Heart Failure Social Worker

## 2020-06-20 NOTE — Anesthesia Procedure Notes (Signed)
Procedure Name: MAC Date/Time: 06/20/2020 2:27 PM Performed by: Imagene Riches, CRNA Pre-anesthesia Checklist: Patient identified, Emergency Drugs available, Suction available, Patient being monitored and Timeout performed Oxygen Delivery Method: Ambu bag

## 2020-06-20 NOTE — Anesthesia Preprocedure Evaluation (Signed)
Anesthesia Evaluation  Patient identified by MRN, date of birth, ID band Patient awake    Reviewed: Allergy & Precautions, NPO status , Patient's Chart, lab work & pertinent test results, reviewed documented beta blocker date and time   Airway Mallampati: III  TM Distance: >3 FB Neck ROM: Full    Dental no notable dental hx. (+) Teeth Intact, Caps, Dental Advisory Given   Pulmonary shortness of breath, with exertion and at rest, sleep apnea and Continuous Positive Airway Pressure Ventilation ,    Pulmonary exam normal breath sounds clear to auscultation       Cardiovascular hypertension, Pt. on medications and Pt. on home beta blockers + CAD and +CHF   Rhythm:Irregular Rate:Normal  Stents LAD and RCA 2008 and 2009 Cardiogenic shock- on Milrinone New onset Atrial Fibrillaiton   Echo 06/15/20 1. Left ventricular ejection fraction, by estimation, is <20%. The left ventricle has severely decreased function. The left ventricle demonstrates global hypokinesis. The left ventricular internal cavity size was severely dilated. Left ventricular diastolic function could not be evaluated.  2. Right ventricular systolic function is severely reduced. The right ventricular size is moderately enlarged. There is moderately elevated pulmonary artery systolic pressure. The estimated right ventricular systolic pressure is 01.7 mmHg.  3. Right atrial size was severely dilated.  4. The mitral valve is degenerative. Moderate mitral valve regurgitation. No evidence of mitral stenosis.  5. The aortic valve is tricuspid. Aortic valve regurgitation is not visualized. Mild aortic valve sclerosis is present, with no evidence of aortic valve stenosis.  6. The inferior vena cava is dilated in size with <50% respiratory variability, suggesting right atrial pressure of 15 mmHg.    Neuro/Psych PSYCHIATRIC DISORDERS Depression negative neurological ROS      GI/Hepatic GERD  Medicated and Controlled,Shock liver   Endo/Other  diabetes, Well Controlled, Type 2, Oral Hypoglycemic Agents  Renal/GU Renal InsufficiencyRenal disease  negative genitourinary   Musculoskeletal negative musculoskeletal ROS (+)   Abdominal (+) + obese,   Peds  Hematology  (+) anemia , Anticoagulated on Heparin gtt   Anesthesia Other Findings   Reproductive/Obstetrics                             Anesthesia Physical Anesthesia Plan  ASA: 3  Anesthesia Plan: General   Post-op Pain Management:    Induction: Intravenous  PONV Risk Score and Plan: 3 and Treatment may vary due to age or medical condition  Airway Management Planned: Natural Airway, Mask and Nasal Cannula  Additional Equipment:   Intra-op Plan:   Post-operative Plan:   Informed Consent: I have reviewed the patients History and Physical, chart, labs and discussed the procedure including the risks, benefits and alternatives for the proposed anesthesia with the patient or authorized representative who has indicated his/her understanding and acceptance.     Dental advisory given  Plan Discussed with: CRNA and Anesthesiologist  Anesthesia Plan Comments:         Anesthesia Quick Evaluation

## 2020-06-20 NOTE — Progress Notes (Signed)
[place] for heparin Indication: atrial fibrillation  Labs: Recent Labs    06/17/20 2148 06/18/20 0128 06/18/20 0443 06/18/20 1035 06/19/20 0421 06/19/20 1600 06/20/20 0252  HGB  --   --  9.5*  --  8.7*  --  8.5*  HCT  --   --  30.5*  --  27.8*  --  27.5*  PLT  --   --  256  --  231  --  228  APTT  --  62*  --    < > 62* 55* 69*  HEPARINUNFRC  --  >1.10*  --   --  >1.10*  --  0.68  CREATININE 2.43*  --  2.30*  --  1.75*  --   --    < > = values in this interval not displayed.      Assessment: 80 y.o. male with h/o Afib, Eliquis on hold, for heparin  Goal of Therapy:  Heparin level 0.3-0.7 units/ml aPTT 66-102 seconds Monitor platelets by anticoagulation protocol: Yes   Plan:  Continue Heparin at current rate   Phillis Knack, PharmD, BCPS

## 2020-06-20 NOTE — Interval H&P Note (Signed)
History and Physical Interval Note:  06/20/2020 1:37 PM  Wesley Winters  has presented today for surgery, with the diagnosis of atrial fibrillation.  The various methods of treatment have been discussed with the patient and family. After consideration of risks, benefits and other options for treatment, the patient has consented to  Procedure(s): TRANSESOPHAGEAL ECHOCARDIOGRAM (TEE) (N/A) CARDIOVERSION (N/A) as a surgical intervention.  The patient's history has been reviewed, patient examined, no change in status, stable for surgery.  I have reviewed the patient's chart and labs.  Questions were answered to the patient's satisfaction.     Jackelin Correia

## 2020-06-20 NOTE — CV Procedure (Signed)
   TRANSESOPHAGEAL ECHOCARDIOGRAM GUIDED DIRECT CURRENT CARDIOVERSION  NAME:  Wesley Winters   MRN: [id number] DOB:  26-Feb-1940   ADMIT DATE: 06/14/2020  INDICATIONS:  Atrial fibrillation  PROCEDURE:   Informed consent was obtained prior to the procedure. The risks, benefits and alternatives for the procedure were discussed and the patient comprehended these risks.  Risks include, but are not limited to, cough, sore throat, vomiting, nausea, somnolence, esophageal and stomach trauma or perforation, bleeding, low blood pressure, aspiration, pneumonia, infection, trauma to the teeth and death.    After a procedural time-out, the oropharynx was anesthetized and the patient was sedated by the anesthesia service. The transesophageal probe was inserted in the esophagus and stomach without difficulty and multiple views were obtained.   FINDINGS:  LEFT VENTRICLE: EF = 20-25%  RIGHT VENTRICLE: Moderately HK  LEFT ATRIUM: Severely dilated 5.0 cm  LEFT ATRIAL APPENDAGE: No clot  RIGHT ATRIUM: Moderately dilated  AORTIC VALVE:  [place]. Mildly calcified No AI/AS  MITRAL VALVE:    Normal severe posterior MR  TRICUSPID VALVE: Moderate TR  PULMONIC VALVE: Trivial PR  INTERATRIAL SEPTUM: No ASD/PFO  PERICARDIUM: No effusion  DESCENDING AORTA: Moderate to severe plaque   CARDIOVERSION:     Indications:  Atrial Fibrillation  Procedure Details:  Once the TEE was complete, the patient had the defibrillator pads placed in the anterior and posterior position. Once an appropriate level of sedation was achieved, the patient received a single biphasic, synchronized 200J shock with prompt conversion to sinus rhythm. No apparent complications.   Glori Bickers, MD  3:03 PM

## 2020-06-20 NOTE — Progress Notes (Signed)
Physical Therapy Treatment Patient Details Name: Wesley Winters MRN: [id number] DOB: 10/06/1940 Today's Date: 06/20/2020    History of Present Illness 80 yo admitted 6/11 after 2 falls at home with fatigue and weakness with ARF. PMHx; HF, cardiomyopathy with EF 25%, Afib, CAD, DM, HTN, recent admit 5/23-5/31    PT Comments    Pt pleasant and demonstrates slowly improving gait and function today. Pt with incontinent stool during transfer with assist to [facility] and for pericare. Pt reliant on RW and chair follow for gait with encouragement to continue mobility throughout the day. D/C to SNF remains approriate until pt can meet goals.   HR 105-126 SpO2 99% on RA BP 123/72 (88)    Follow Up Recommendations  SNF;Supervision for mobility/OOB     Equipment Recommendations  Rolling walker with 5" wheels;3in1 (PT)    Recommendations for Other Services       Precautions / Restrictions Precautions Precautions: Fall Precaution Comments: stool incontinence    Mobility  Bed Mobility Overal bed mobility: Needs Assistance Bed Mobility: Supine to Sit     Supine to sit: Min assist     General bed mobility comments: HOB 10 degrees with cues for sequence, use of rail and assist of pad to fully scoot to EOB    Transfers Overall transfer level: Needs assistance   Transfers: Sit to/from Stand;Stand Pivot Transfers Sit to Stand: Min guard Stand pivot transfers: Min guard       General transfer comment: cues for hand placement with pt able to stand from bed, BSC and recliner. pivot bed to [facility] with RW with guarding for lines and safety  Ambulation/Gait Ambulation/Gait assistance: Min assist;+2 safety/equipment Gait Distance (Feet): 46 Feet Assistive device: Rolling walker (2 wheeled) Gait Pattern/deviations: Step-through pattern;Decreased stride length;Trunk flexed   Gait velocity interpretation: <1.8 ft/sec, indicate of risk for recurrent falls General Gait Details: cues for posture,  proximity to RW, safety and chair follow. pt walked 77' then 82' after seated rest . Prior to initial gait pt stood grossly 2 min statically for pericare. HR 126 with gait   Stairs             Wheelchair Mobility    Modified Rankin (Stroke Patients Only)       Balance Overall balance assessment: Needs assistance Sitting-balance support: Feet supported;No upper extremity supported Sitting balance-Leahy Scale: Fair Sitting balance - Comments: EOB without support   Standing balance support: Bilateral upper extremity supported Standing balance-Leahy Scale: Poor Standing balance comment: RW for standing and gait                            Cognition Arousal/Alertness: Awake/alert Behavior During Therapy: WFL for tasks assessed/performed Overall Cognitive Status: Impaired/Different from baseline Area of Impairment: Safety/judgement                         Safety/Judgement: Decreased awareness of deficits            Exercises General Exercises - Lower Extremity Long Arc Quad: AROM;Both;Seated;20 reps Hip ABduction/ADduction: AROM;Both;Seated;20 reps Hip Flexion/Marching: AROM;Both;Seated;15 reps;20 reps    General Comments        Pertinent Vitals/Pain Pain Assessment: No/denies pain    Home Living                      Prior Function            PT Goals (  current goals can now be found in the care plan section) Progress towards PT goals: Progressing toward goals    Frequency    Min 3X/week      PT Plan Current plan remains appropriate    Co-evaluation              AM-PAC PT "6 Clicks" Mobility   Outcome Measure  Help needed turning from your back to your side while in a flat bed without using bedrails?: A Little Help needed moving from lying on your back to sitting on the side of a flat bed without using bedrails?: A Little Help needed moving to and from a bed to a chair (including a wheelchair)?: A  Little Help needed standing up from a chair using your arms (e.g., wheelchair or bedside chair)?: A Little Help needed to walk in hospital room?: A Lot Help needed climbing 3-5 steps with a railing? : Total 6 Click Score: 15    End of Session Equipment Utilized During Treatment: Gait belt Activity Tolerance: Patient tolerated treatment well Patient left: in chair;with call bell/phone within reach;with nursing/sitter in room Nurse Communication: Mobility status PT Visit Diagnosis: Other abnormalities of gait and mobility (R26.89);Difficulty in walking, not elsewhere classified (R26.2)     Time: 4514-6047 PT Time Calculation (min) (ACUTE ONLY): 32 min  Charges:  $Gait Training: 8-22 mins $Therapeutic Activity: 8-22 mins                     Aarianna Hoadley P, PT Acute Rehabilitation Services Pager: [phone] Office: [place] 06/20/2020, 9:41 AM

## 2020-06-20 NOTE — Progress Notes (Signed)
RT came to place CPAP on patient. Patient already on CPAP tolerating well.

## 2020-06-20 NOTE — Progress Notes (Signed)
[place] for heparin Indication: atrial fibrillation  Labs: Recent Labs    06/17/20 2148 06/18/20 0128 06/18/20 0443 06/18/20 1035 06/19/20 0421 06/19/20 1600 06/20/20 0252 06/20/20 1016 06/20/20 1221  HGB   < >  --  9.5*  --  8.7*  --  8.5*  --   --   HCT  --   --  30.5*  --  27.8*  --  27.5*  --   --   PLT  --   --  256  --  231  --  228  --   --   APTT  --  62*  --    < > 62*   < > 69* 49* 45*  HEPARINUNFRC  --  >1.10*  --   --  >1.10*  --  0.68  --   --   CREATININE  --   --  2.30*  --  1.75*  --  1.24  --   --    < > = values in this interval not displayed.      Assessment: 80 yo M on apixaban PTA for afib now holding for cardiogenic shock. Last dose of apixaban 6/12 at 10am. Pharmacy consulted for heparin.   aPTT trending down at 69  - drawn central line, heparin running peripheral. Heparin level closer to correlating but still on higher end of range whereas aPTT on lower end. Hgb 8.5, plt 228. Urine having increased red tint, reddish smear with bowel movement - watching both closely.   Goal of Therapy:  Heparin level 0.3-0.7 units/ml aPTT 66-102 seconds Monitor platelets by anticoagulation protocol: Yes   Plan:  Continue heparin infusion at 850 units/hr  F/u aPTT in 8 hours to confirm Monitor daily aPTT, HL, CBC/plt Monitor for signs/symptoms of bleeding  F/u restart apixaban after procedures   Antonietta Jewel, PharmD, [place] Pharmacist  Phone: [phone] 06/20/2020 2:37 PM  Please check AMION for all [place] phone numbers After 10:00 PM, call [place] [phone]   ADDENDUM Confirmatory aPTT came back low at 49 - consider if accurate given drop since last level. Nursing concerned about IV line - switched to central line and peripheral draw came back similar subtherapeutic at 45. Given plan for TEE/DCCV today, will increase heparin infusion to 1000 units/hr to get into goal range and check level 8 hours after  rate change. No significant change in bleeding.   Antonietta Jewel, PharmD, [place] Clinical Pharmacist

## 2020-06-20 NOTE — Evaluation (Signed)
Occupational Therapy Evaluation Patient Details Name: Wesley Winters MRN: [id number] DOB: Aug 01, 1940 Today's Date: 06/20/2020    History of Present Illness 80 yo admitted 6/11 after 2 falls at home with fatigue and weakness with ARF. 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 Pt presents now with deficits in endurance, standing balance, and strength. Pt overall Min A + 2 for lines/safety for short mobility to bathroom in room using RW with 3 seated rest breaks required due to fatigue. Pt requires UE support for stability during dynamic tasks. Pt requires Min A for UB ADLs and Max A for LB ADLs due to deficits. Recommend SNF for short term rehab as pt is below functional baseline and at increased risk for falls due to endurance deficits.   BP 121/80 (93) after activity SpO2 99-100% on RA HR up to 127bpm with activity     Follow Up Recommendations  SNF;Supervision/Assistance - 24 hour    Equipment Recommendations  3 in 1 bedside commode;Other (comment) (Rolling walker)    Recommendations for Other Services       Precautions / Restrictions Precautions Precautions: Fall Precaution Comments: stool incontinence Restrictions Weight Bearing Restrictions: No      Mobility Bed Mobility Overal bed mobility: Needs Assistance Bed Mobility: Supine to Sit     Supine to sit: Min assist     General bed mobility comments: received in chair    Transfers Overall transfer level: Needs assistance Equipment used: Rolling walker (2 wheeled) Transfers: Sit to/from Stand Sit to Stand: Min assist Stand pivot transfers: Min guard       General transfer comment: cues for hand placement, varied from min guard to Min A for sit to stands from recliner    Balance Overall balance assessment: Needs assistance Sitting-balance support: Feet  supported;No upper extremity supported Sitting balance-Leahy Scale: Fair Sitting balance - Comments: EOB without support   Standing balance support: Bilateral upper extremity supported Standing balance-Leahy Scale: Fair Standing balance comment: fair static standing without support at sink though notably shaky in standing, use of B UE support for mobility                           ADL either performed or assessed with clinical judgement   ADL Overall ADL's : Needs assistance/impaired Eating/Feeding: Set up;Sitting   Grooming: Min guard;Standing;Oral care;Brushing hair Grooming Details (indicate cue type and reason): min guard for safety due to shakiness without UE support at sink, requires seated rest break Upper Body Bathing: Sitting;Minimal assistance   Lower Body Bathing: Sit to/from stand;Maximal assistance Lower Body Bathing Details (indicate cue type and reason): Max A for peri care in standing after smear noted on pad Upper Body Dressing : Minimal assistance;Sitting   Lower Body Dressing: Maximal assistance;Sit to/from stand   Toilet Transfer: Minimal assistance;Ambulation;RW   Toileting- Clothing Manipulation and Hygiene: Maximal assistance;Sit to/from stand         General ADL Comments: Pt with significant deficits in endurance, requiring frequent seated rest breaks for minimal activity. Requires UE support for mobility due to unsteadiness     Vision Baseline Vision/History: Wears glasses Wears Glasses: At all times Patient Visual Report: No change from baseline Vision Assessment?: No apparent visual deficits     Perception     Praxis  Pertinent Vitals/Pain Pain Assessment: No/denies pain     Hand Dominance Right   Extremity/Trunk Assessment Upper Extremity Assessment Upper Extremity Assessment: Generalized weakness;RUE deficits/detail RUE Deficits / Details: R UE with edema around forearm/bicep, elevated at end of session and loosened BP  cuff   Lower Extremity Assessment Lower Extremity Assessment: Defer to PT evaluation   Cervical / Trunk Assessment Cervical / Trunk Assessment: Kyphotic   Communication Communication Communication: No difficulties   Cognition Arousal/Alertness: Awake/alert Behavior During Therapy: WFL for tasks assessed/performed;Flat affect Overall Cognitive Status: Impaired/Different from baseline Area of Impairment: Safety/judgement                         Safety/Judgement: Decreased awareness of deficits     General Comments: pt with improved recognition of deficits today but continues to lack full understanding of assist needed   General Comments  BP 121/80 after activity, HR up to 127bpm    Exercises General Exercises - Lower Extremity Long Arc Quad: AROM;Both;Seated;20 reps Hip ABduction/ADduction: AROM;Both;Seated;20 reps Hip Flexion/Marching: AROM;Both;Seated;15 reps;20 reps   Shoulder Instructions      Home Living Family/patient expects to be discharged to:: Private residence Living Arrangements: Spouse/significant other Available Help at Discharge: Family;Available 24 hours/day Type of Home: House Home Access: Stairs to enter;Ramped entrance Entrance Stairs-Number of Steps: ramp + 1 step   Home Layout: One level     Bathroom Shower/Tub: Occupational psychologist: Handicapped height     Home Equipment: Cane - single point;Shower seat          Prior Functioning/Environment Level of Independence: Independent        Comments: no use of AD, Indepedent with ADLs        OT Problem List: Decreased strength;Decreased activity tolerance;Impaired balance (sitting and/or standing);Decreased safety awareness;Decreased knowledge of use of DME or AE      OT Treatment/Interventions: Self-care/ADL training;Therapeutic exercise;Energy conservation;DME and/or AE instruction;Therapeutic activities;Patient/family education;Balance training    OT Goals(Current  goals can be found in the care plan section) Acute Rehab OT Goals Patient Stated Goal: return home OT Goal Formulation: With patient Time For Goal Achievement: 07/04/20 Potential to Achieve Goals: Good ADL Goals Pt Will Perform Grooming: with modified independence;standing Pt Will Perform Lower Body Bathing: sit to/from stand;sitting/lateral leans;with min guard assist Pt Will Perform Lower Body Dressing: with min guard assist;sit to/from stand;sitting/lateral leans Pt Will Transfer to Toilet: with supervision;ambulating Additional ADL Goal #1: Pt to verbalize at least 2 energy conservation strategies to implement during ADLs/mobility  OT Frequency: Min 2X/week   Barriers to D/C:            Co-evaluation              AM-PAC OT "6 Clicks" Daily Activity     Outcome Measure Help from another person eating meals?: A Little Help from another person taking care of personal grooming?: A Little Help from another person toileting, which includes using toliet, bedpan, or urinal?: A Lot Help from another person bathing (including washing, rinsing, drying)?: A Lot Help from another person to put on and taking off regular upper body clothing?: A Little Help from another person to put on and taking off regular lower body clothing?: A Lot 6 Click Score: 15   End of Session Equipment Utilized During Treatment: Gait belt;Rolling walker Nurse Communication: Mobility status;Other (comment) (IV beeping)  Activity Tolerance: Patient limited by fatigue Patient left: in chair;with call bell/phone within reach  OT  Visit Diagnosis: Unsteadiness on feet (R26.81);Other abnormalities of gait and mobility (R26.89);Muscle weakness (generalized) (M62.81)                Time: 0223-3612 OT Time Calculation (min): 33 min Charges:  OT General Charges $OT Visit: 1 Visit OT Evaluation $OT Eval Moderate Complexity: 1 Mod OT Treatments $Self Care/Home Management : 8-22 mins  Malachy Chamber, OTR/L Acute Rehab  Services Office: [phone]   Layla Maw 06/20/2020, 10:13 AM

## 2020-06-20 NOTE — H&P (View-Only) (Signed)
Advanced Heart Failure Rounding Note  PCP-Cardiologist: Sinclair Grooms, MD   Subjective:   Cardiogenic shock. New onset AF.   6/12 CO-OX 43% --->Started on milrinone 0.25 mcg.  6/13 Milrinone increased to 0.375. Co-ox 58% today.  6/15 Back in Afib w/ RVR, amio rebolused, rate increased to 60  6/15 Fever spike + leukocytosis + hypotension>>started on empiric cefepime + NE   Sepsis improving. AF. WBC downtrending 16>>14K. BCx + Proteus Species. ID narrowed abx to ceftriaxone   On NE 5 + milrinone 0.375. Co-ox 66%.   Wt stable. SCr continues to improve, 2.3>>1.8->1.2. CVP not currently set up.   Na Low at 124   Remains in AFib low 100s. Feels ok today no complaints.    Objective:   Weight Range: 92.1 kg Body mass index is 30.87 kg/m.   Vital Signs:   Temp:  [98.1 F (36.7 C)-98.8 F (37.1 C)] 98.6 F (37 C) (06/17 0400) Pulse Rate:  [33-129] 71 (06/17 0645) Resp:  [13-33] 14 (06/17 0645) BP: (84-139)/(46-94) 116/64 (06/17 0630) SpO2:  [91 %-100 %] 100 % (06/17 0645) Weight:  [92.1 kg] 92.1 kg (06/17 0322) Last BM Date: 06/18/20  Weight change: Filed Weights   06/18/20 0500 06/19/20 0500 06/20/20 0322  Weight: 92.3 kg 92.2 kg 92.1 kg    Intake/Output:   Intake/Output Summary (Last 24 hours) at 06/20/2020 0711 Last data filed at 06/20/2020 0600 Gross per 24 hour  Intake 1826.91 ml  Output 1645 ml  Net 181.91 ml      Physical Exam   PHYSICAL EXAM: General:  Well appearing, sitting up in bed. No respiratory difficulty HEENT: normal Neck: supple. JVD 7 cm. Carotids 2+ bilat; no bruits. No lymphadenopathy or thyromegaly appreciated. Cor: PMI nondisplaced. Irreguarlly irregular rhythm and rate No rubs, gallops or murmurs. Lungs: clear Abdomen: soft, nontender, nondistended. No hepatosplenomegaly. No bruits or masses. Good bowel sounds. Extremities: no cyanosis, clubbing, rash, edema + RUE PICC  Neuro: alert & oriented x 3, cranial nerves grossly  intact. moves all 4 extremities w/o difficulty. Affect pleasant.    Telemetry   Afib w/ RVR low 100s-110s  EKG    N/A  Labs    CBC Recent Labs    06/19/20 0421 06/20/20 0252  WBC 15.8* 14.3*  HGB 8.7* 8.5*  HCT 27.8* 27.5*  MCV 76.8* 76.8*  PLT 231 814   Basic Metabolic Panel Recent Labs    06/19/20 0421 06/20/20 0252  NA 129* 124*  K 3.1* 3.6  CL 87* 88*  CO2 30 27  GLUCOSE 183* 270*  BUN 61* 45*  CREATININE 1.75* 1.24  CALCIUM 7.8* 7.3*  MG 1.9 1.9   Liver Function Tests Recent Labs    06/19/20 0421 06/20/20 0252  AST 198* 83*  ALT 691* 461*  ALKPHOS 110 116  BILITOT 1.7* 1.3*  PROT 5.2* 5.4*  ALBUMIN 2.0* 1.9*   No results for input(s): LIPASE, AMYLASE in the last 72 hours. Cardiac Enzymes No results for input(s): CKTOTAL, CKMB, CKMBINDEX, TROPONINI in the last 72 hours.  BNP: BNP (last 3 results) Recent Labs    05/26/20 1429 06/15/20 0729  BNP 1,609.8* 2,424.4*    ProBNP (last 3 results) Recent Labs    07/11/19 1107 04/23/20 1446  PROBNP 445 2,488*     D-Dimer No results for input(s): DDIMER in the last 72 hours. Hemoglobin A1C No results for input(s): HGBA1C in the last 72 hours. Fasting Lipid Panel No results for input(s): CHOL, HDL,  LDLCALC, TRIG, CHOLHDL, LDLDIRECT in the last 72 hours. Thyroid Function Tests No results for input(s): TSH, T4TOTAL, T3FREE, THYROIDAB in the last 72 hours.  Invalid input(s): FREET3   Other results:   Imaging    DG CHEST PORT 1 VIEW  Result Date: 06/19/2020 CLINICAL DATA:  Recent PICC line placement EXAM: PORTABLE CHEST 1 VIEW COMPARISON:  06/18/2020 FINDINGS: Cardiac shadow is enlarged. Left-sided PICC line is noted with the catheter tip in the distal superior vena cava. Right jugular line has been removed in the interval. Lungs are clear. No bony abnormality is noted. IMPRESSION: New left-sided PICC line as described. Electronically Signed   By: Inez Catalina M.D.   On: 06/19/2020  16:03   [place] EKG SITE RITE  Result Date: 06/19/2020 If Site Rite image not attached, placement could not be confirmed due to current cardiac rhythm.    Medications:     Scheduled Medications:  Chlorhexidine Gluconate Cloth  6 each Topical Q0600   insulin aspart  0-9 Units Subcutaneous TID WC   insulin glargine  10 Units Subcutaneous QHS   mupirocin ointment   Topical Q12H   sodium chloride flush  10-40 mL Intracatheter Q12H   tamsulosin  0.4 mg Oral QPC supper   vitamin B-12  1,000 mcg Oral QHS    Infusions:  sodium chloride Stopped (06/19/20 1241)   sodium chloride     amiodarone 60 mg/hr (06/20/20 0600)   cefTRIAXone (ROCEPHIN)  IV Stopped (06/19/20 2029)   heparin 850 Units/hr (06/20/20 0600)   milrinone 0.375 mcg/kg/min (06/20/20 0600)   norepinephrine (LEVOPHED) Adult infusion 5 mcg/min (06/20/20 0600)   potassium chloride 10 mEq (06/20/20 0654)    PRN Medications:   Assessment/Plan  Acute systolic HF -> 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 NE now off.  - watch closely for need for mechanical support - doubt ischemic as hstrop low despite profound hemodynamic support but given h/p CAD and multiple CRFs will need ischemic eval once more stable and renal function improves - 6/12 CO-OX 43.5%. Started on milrinone 0.25 mcg. Increased milrinone to 0.375 mcg. NE added for hypotension, currently on 5 mcg. Co-ox 66% today  - volume status stable. Euvolemic. Hold diuretics again today. Set up CVP  - BP soft. Would hold off on adding hydralazine/imdur with AKI (avoid hypotension for renal perfusion)  - wean NE as tolerated. Can add midodrine to facilitate     2. CAD - s/p previous LAD stenting - doubt ischemia is major factor in HF given low hsTrop - plan as above - continue  ASA - hold statin with shock liver   3. AF with RVR - rates improved today - continue amio gtt at 60/hr  - Eliquis stopped 6/12 and switched to heparin.  - Continue heparin drip. Watch with some hematuria.  - TEE/ DC-CV toda   4. AKI - due to ATN and shock - improving with hemodynamic support - Creatinine trending down 2.95>2.7>>2.4>>2.3>1.8->1.2   5. Shock liver - due to shock, improving  - supportive care   6. DM2 - SSI    7. Hypokalemia  - K 3.6 - supp KCl   8. Code status - he was made DNR/DNI but was pretty functional prior to developing HF from AF. I think he has a likely reversible cause  for his deterioration. - Dr Haroldine Laws discussed with him and will be Full Code for now.   9. Sepsis/GN Bacteremia  - PCT 2.7. CXR w/o infiltrate. Foley replaced . BCx Proteus Species.  - Abx narrowed from cefepime to ceftriaxone per ID. Treat x 7 days  - sepsis improving   10. Wound on left foot - deep but clean based - WC recs bid dressing changes   11. Hyponatremia - Na 124, mentating ok  - fluid restrict - may need tolvaptan after DCCV    Length of Stay: [address], PA-C  06/20/2020, 7:11 AM  Advanced Heart Failure Team Pager [phone] (M-F; 7a - 5p)  Please contact [facility] for night-coverage after hours (5p -7a ) and weekends on [email or web address]  Agree with above.   Looks much better today. Remains on NE and milrinone. Still in AF despite IV amio. Co-ox 66%. Creatinine has normalized. Bcx with proteus. ABx narrowed  General:  Elderly No resp difficulty HEENT: normal Neck: supple. no JVD. Carotids 2+ bilat; no bruits. No lymphadenopathy or thryomegaly appreciated. Cor: PMI nondisplaced. Irregular tachy No rubs, gallops or murmurs. Lungs: clear Abdomen: soft, nontender, nondistended. No hepatosplenomegaly. No bruits or masses. Good bowel sounds. Extremities: no cyanosis, clubbing, rash, edema Neuro: alert & orientedx3, cranial nerves grossly intact.  moves all 4 extremities w/o difficulty. Affect pleasant  Continues to improve from sepsis and cardiogenic shock.Rich Brave NE as tolerated. TEE/DC-CV today. Continue abx for Proteus sepsis. Need to start ambulating. Give tolvaptan for hypoNa.  CRITICAL CARE Performed by: Glori Bickers  Total critical care time: 35 minutes  Critical care time was exclusive of separately billable procedures and treating other patients.  Critical care was necessary to treat or prevent imminent or life-threatening deterioration.  Critical care was time spent personally by me (independent of midlevel providers or residents) on the following activities: development of treatment plan with patient and/or surrogate as well as nursing, discussions with consultants, evaluation of patient's response to treatment, examination of patient, obtaining history from patient or surrogate, ordering and performing treatments and interventions, ordering and review of laboratory studies, ordering and review of radiographic studies, pulse oximetry and re-evaluation of patient's condition.  Glori Bickers, MD  8:49 AM

## 2020-06-20 NOTE — Progress Notes (Addendum)
Advanced Heart Failure Rounding Note  PCP-Cardiologist: Sinclair Grooms, MD   Subjective:   Cardiogenic shock. New onset AF.   6/12 CO-OX 43% --->Started on milrinone 0.25 mcg.  6/13 Milrinone increased to 0.375. Co-ox 58% today.  6/15 Back in Afib w/ RVR, amio rebolused, rate increased to 60  6/15 Fever spike + leukocytosis + hypotension>>started on empiric cefepime + NE   Sepsis improving. AF. WBC downtrending 16>>14K. BCx + Proteus Species. ID narrowed abx to ceftriaxone   On NE 5 + milrinone 0.375. Co-ox 66%.   Wt stable. SCr continues to improve, 2.3>>1.8->1.2. CVP not currently set up.   Na Low at 124   Remains in AFib low 100s. Feels ok today no complaints.    Objective:   Weight Range: 92.1 kg Body mass index is 30.87 kg/m.   Vital Signs:   Temp:  [98.1 F (36.7 C)-98.8 F (37.1 C)] 98.6 F (37 C) (06/17 0400) Pulse Rate:  [33-129] 71 (06/17 0645) Resp:  [13-33] 14 (06/17 0645) BP: (84-139)/(46-94) 116/64 (06/17 0630) SpO2:  [91 %-100 %] 100 % (06/17 0645) Weight:  [92.1 kg] 92.1 kg (06/17 0322) Last BM Date: 06/18/20  Weight change: Filed Weights   06/18/20 0500 06/19/20 0500 06/20/20 0322  Weight: 92.3 kg 92.2 kg 92.1 kg    Intake/Output:   Intake/Output Summary (Last 24 hours) at 06/20/2020 0711 Last data filed at 06/20/2020 0600 Gross per 24 hour  Intake 1826.91 ml  Output 1645 ml  Net 181.91 ml      Physical Exam   PHYSICAL EXAM: General:  Well appearing, sitting up in bed. No respiratory difficulty HEENT: normal Neck: supple. JVD 7 cm. Carotids 2+ bilat; no bruits. No lymphadenopathy or thyromegaly appreciated. Cor: PMI nondisplaced. Irreguarlly irregular rhythm and rate No rubs, gallops or murmurs. Lungs: clear Abdomen: soft, nontender, nondistended. No hepatosplenomegaly. No bruits or masses. Good bowel sounds. Extremities: no cyanosis, clubbing, rash, edema + RUE PICC  Neuro: alert & oriented x 3, cranial nerves grossly  intact. moves all 4 extremities w/o difficulty. Affect pleasant.    Telemetry   Afib w/ RVR low 100s-110s  EKG    N/A  Labs    CBC Recent Labs    06/19/20 0421 06/20/20 0252  WBC 15.8* 14.3*  HGB 8.7* 8.5*  HCT 27.8* 27.5*  MCV 76.8* 76.8*  PLT 231 858   Basic Metabolic Panel Recent Labs    06/19/20 0421 06/20/20 0252  NA 129* 124*  K 3.1* 3.6  CL 87* 88*  CO2 30 27  GLUCOSE 183* 270*  BUN 61* 45*  CREATININE 1.75* 1.24  CALCIUM 7.8* 7.3*  MG 1.9 1.9   Liver Function Tests Recent Labs    06/19/20 0421 06/20/20 0252  AST 198* 83*  ALT 691* 461*  ALKPHOS 110 116  BILITOT 1.7* 1.3*  PROT 5.2* 5.4*  ALBUMIN 2.0* 1.9*   No results for input(s): LIPASE, AMYLASE in the last 72 hours. Cardiac Enzymes No results for input(s): CKTOTAL, CKMB, CKMBINDEX, TROPONINI in the last 72 hours.  BNP: BNP (last 3 results) Recent Labs    05/26/20 1429 06/15/20 0729  BNP 1,609.8* 2,424.4*    ProBNP (last 3 results) Recent Labs    07/11/19 1107 04/23/20 1446  PROBNP 445 2,488*     D-Dimer No results for input(s): DDIMER in the last 72 hours. Hemoglobin A1C No results for input(s): HGBA1C in the last 72 hours. Fasting Lipid Panel No results for input(s): CHOL, HDL,  LDLCALC, TRIG, CHOLHDL, LDLDIRECT in the last 72 hours. Thyroid Function Tests No results for input(s): TSH, T4TOTAL, T3FREE, THYROIDAB in the last 72 hours.  Invalid input(s): FREET3   Other results:   Imaging    DG CHEST PORT 1 VIEW  Result Date: 06/19/2020 CLINICAL DATA:  Recent PICC line placement EXAM: PORTABLE CHEST 1 VIEW COMPARISON:  06/18/2020 FINDINGS: Cardiac shadow is enlarged. Left-sided PICC line is noted with the catheter tip in the distal superior vena cava. Right jugular line has been removed in the interval. Lungs are clear. No bony abnormality is noted. IMPRESSION: New left-sided PICC line as described. Electronically Signed   By: Inez Catalina M.D.   On: 06/19/2020  16:03   [place] EKG SITE RITE  Result Date: 06/19/2020 If Site Rite image not attached, placement could not be confirmed due to current cardiac rhythm.    Medications:     Scheduled Medications:  Chlorhexidine Gluconate Cloth  6 each Topical Q0600   insulin aspart  0-9 Units Subcutaneous TID WC   insulin glargine  10 Units Subcutaneous QHS   mupirocin ointment   Topical Q12H   sodium chloride flush  10-40 mL Intracatheter Q12H   tamsulosin  0.4 mg Oral QPC supper   vitamin B-12  1,000 mcg Oral QHS    Infusions:  sodium chloride Stopped (06/19/20 1241)   sodium chloride     amiodarone 60 mg/hr (06/20/20 0600)   cefTRIAXone (ROCEPHIN)  IV Stopped (06/19/20 2029)   heparin 850 Units/hr (06/20/20 0600)   milrinone 0.375 mcg/kg/min (06/20/20 0600)   norepinephrine (LEVOPHED) Adult infusion 5 mcg/min (06/20/20 0600)   potassium chloride 10 mEq (06/20/20 0654)    PRN Medications:   Assessment/Plan  Acute systolic HF -> 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 NE now off.  - watch closely for need for mechanical support - doubt ischemic as hstrop low despite profound hemodynamic support but given h/p CAD and multiple CRFs will need ischemic eval once more stable and renal function improves - 6/12 CO-OX 43.5%. Started on milrinone 0.25 mcg. Increased milrinone to 0.375 mcg. NE added for hypotension, currently on 5 mcg. Co-ox 66% today  - volume status stable. Euvolemic. Hold diuretics again today. Set up CVP  - BP soft. Would hold off on adding hydralazine/imdur with AKI (avoid hypotension for renal perfusion)  - wean NE as tolerated. Can add midodrine to facilitate     2. CAD - s/p previous LAD stenting - doubt ischemia is major factor in HF given low hsTrop - plan as above - continue  ASA - hold statin with shock liver   3. AF with RVR - rates improved today - continue amio gtt at 60/hr  - Eliquis stopped 6/12 and switched to heparin.  - Continue heparin drip. Watch with some hematuria.  - TEE/ DC-CV toda   4. AKI - due to ATN and shock - improving with hemodynamic support - Creatinine trending down 2.95>2.7>>2.4>>2.3>1.8->1.2   5. Shock liver - due to shock, improving  - supportive care   6. DM2 - SSI    7. Hypokalemia  - K 3.6 - supp KCl   8. Code status - he was made DNR/DNI but was pretty functional prior to developing HF from AF. I think he has a likely reversible cause  for his deterioration. - Dr Haroldine Laws discussed with him and will be Full Code for now.   9. Sepsis/GN Bacteremia  - PCT 2.7. CXR w/o infiltrate. Foley replaced . BCx Proteus Species.  - Abx narrowed from cefepime to ceftriaxone per ID. Treat x 7 days  - sepsis improving   10. Wound on left foot - deep but clean based - WC recs bid dressing changes   11. Hyponatremia - Na 124, mentating ok  - fluid restrict - may need tolvaptan after DCCV    Length of Stay: [address] PA-C  06/20/2020, 7:11 AM  Advanced Heart Failure Team Pager [phone] (M-F; 7a - 5p)  Please contact [facility] for night-coverage after hours (5p -7a ) and weekends on [email or web address]  Agree with above.   Looks much better today. Remains on NE and milrinone. Still in AF despite IV amio. Co-ox 66%. Creatinine has normalized. Bcx with proteus. ABx narrowed  General:  Elderly No resp difficulty HEENT: normal Neck: supple. no JVD. Carotids 2+ bilat; no bruits. No lymphadenopathy or thryomegaly appreciated. Cor: PMI nondisplaced. Irregular tachy No rubs, gallops or murmurs. Lungs: clear Abdomen: soft, nontender, nondistended. No hepatosplenomegaly. No bruits or masses. Good bowel sounds. Extremities: no cyanosis, clubbing, rash, edema Neuro: alert & orientedx3, cranial nerves grossly intact.  moves all 4 extremities w/o difficulty. Affect pleasant  Continues to improve from sepsis and cardiogenic shock.Rich Brave NE as tolerated. TEE/DC-CV today. Continue abx for Proteus sepsis. Need to start ambulating. Give tolvaptan for hypoNa.  CRITICAL CARE Performed by: Glori Bickers  Total critical care time: 35 minutes  Critical care time was exclusive of separately billable procedures and treating other patients.  Critical care was necessary to treat or prevent imminent or life-threatening deterioration.  Critical care was time spent personally by me (independent of midlevel providers or residents) on the following activities: development of treatment plan with patient and/or surrogate as well as nursing, discussions with consultants, evaluation of patient's response to treatment, examination of patient, obtaining history from patient or surrogate, ordering and performing treatments and interventions, ordering and review of laboratory studies, ordering and review of radiographic studies, pulse oximetry and re-evaluation of patient's condition.  Glori Bickers, MD  8:49 AM

## 2020-06-20 NOTE — TOC Progression Note (Addendum)
Transition of Care (TOC) - Progression Note  Heart Failure   Patient Details  Name: Wesley Winters MRN: [id number] Date of Birth: 11-05-1940  Transition of Care [facility]) CM/SW [place], [place] Phone Number: 06/20/2020, 5:09 PM  Clinical Narrative:    CSW received consult for possible SNF placement at time of discharge. CSW spoke with the patient's wife who reported she does want her husband to go to [facility] or maybe [place] and that her husband was agreeable but if he can't go to either of those places they would prefer home health. Patient's spouse, Inez Catalina reported that she may be unable to care for the patient at their home given the patient's current physical needs and fall risk. Patient's wife and reportedly Mr. Smolen also expressed understanding of PT recommendation and is agreeable to SNF placement at time of discharge. Patient and spouse report preference for [facility] or [facility] and if either don't have a bed available they prefer Mr. Rogan go home with home health. CSW discussed insurance authorization process and provided Medicare SNF ratings list. Patient has received the COVID vaccines. Patient expressed being hopeful for rehab and to feel better soon. No further questions reported at this time.    CSW will continue to follow throughout discharge.   Expected Discharge Plan: [place] Barriers to Discharge: Continued Medical Work up  Expected Discharge Plan and Services Expected Discharge Plan: [place] In-house Referral: Clinical Social Work Discharge Planning Services: CM Consult   Living arrangements for the past 2 months: Single Family Home                                       Social Determinants of Health (SDOH) Interventions    Readmission Risk Interventions No flowsheet data found.  Attikus Bartoszek, MSW, [place] Heart Failure Social Worker

## 2020-06-20 NOTE — NC FL2 (Signed)
[place] LEVEL OF CARE SCREENING TOOL     IDENTIFICATION  Patient Name: Wesley Winters Birthdate: 1940/10/19 Sex: male Admission Date (Current Location): 06/14/2020  [facility] and [place] Number:  Herbalist and Address:  [facility] [place], [place] [address] [place], [place] [address]      Provider Number: [id number]  Attending Physician Name and Address:  Ezekiel Slocumb, DO  Relative Name and Phone Number:  Fate, Caster [phone]    Current Level of Care: Hospital Recommended Level of Care: [place] Prior Approval Number:    Date Approved/Denied:   PASRR Number: [id number]  Discharge Plan: SNF    Current Diagnoses: Patient Active Problem List   Diagnosis Date Noted   Cardiogenic shock [facility])    ARF (acute renal failure) ([place]) 06/14/2020   Hypomagnesemia 75/64/3329   Acute systolic heart failure ([place]) 05/26/2020   Persistent atrial fibrillation ([place]) 05/26/2020   Chronic anticoagulation 05/26/2020   CAD in native artery 02/22/2016   Hyperlipidemia LDL goal <70 02/22/2016   Malignant neoplasm of prostate ([place]) 11/04/2014   Diabetes ([place]) 11/04/2014    Orientation RESPIRATION BLADDER Height & Weight     Self, Time, Situation, Place  Normal, Other (Comment) (possible CPAP) Continent, External catheter Weight: 203 lb 0.7 oz (92.1 kg) Height:  5\' 8"  (172.7 cm)  BEHAVIORAL SYMPTOMS/MOOD NEUROLOGICAL BOWEL NUTRITION STATUS      Incontinent Diet (See D/C Summary)  AMBULATORY STATUS COMMUNICATION OF NEEDS Skin   Extensive Assist Verbally Other (Comment) (Diabetic ulcer foot left posterior)                       Personal Care Assistance Level of Assistance  Bathing, Feeding, Dressing Bathing Assistance: Limited assistance Feeding assistance: Limited assistance (needs help with set up) Dressing Assistance: Limited assistance     Functional Limitations Info  Sight, Hearing, Speech Sight Info:  Impaired Hearing Info: Adequate Speech Info: Adequate    SPECIAL CARE FACTORS FREQUENCY  PT (By licensed PT), OT (By licensed OT)     PT Frequency: 5x/week OT Frequency: 5x/week            Contractures Contractures Info: Not present    Additional Factors Info  Code Status, Allergies, Insulin Sliding Scale Code Status Info: Full Allergies Info: Metformin Hcl, Other, Amoxicillin, Amoxicillin-pot Clavulanate, Empagliflozin, Januvia (Sitagliptin)   Insulin Sliding Scale Info: See Med List       Current Medications (06/20/2020):  This is the current hospital active medication list Current Facility-Administered Medications  Medication Dose Route Frequency Provider Last Rate Last Admin   0.9 %  sodium chloride infusion  250 mL Intravenous Continuous Rise Patience, MD 10 mL/hr at 06/20/20 1600 Infusion Verify at 06/20/20 1600   acetaminophen (TYLENOL) tablet 650 mg  650 mg Oral Q6H PRN Consuelo Pandy, PA-C   650 mg at 06/18/20 0849   amiodarone (NEXTERONE PREMIX) 360-4.14 MG/200ML-% (1.8 mg/mL) IV infusion  60 mg/hr Intravenous Continuous Lyda Jester M, PA-C 33.3 mL/hr at 06/20/20 1611 60 mg/hr at 06/20/20 1611   cefTRIAXone (ROCEPHIN) 2 g in sodium chloride 0.9 % 100 mL IVPB  2 g Intravenous Q24H Susa Raring, [place]   Stopped at 06/19/20 2029   Chlorhexidine Gluconate Cloth 2 % PADS 6 each  6 each Topical Q0600 Tyna Jaksch, MD   6 each at 06/20/20 0900   heparin ADULT infusion 100 units/mL (25000 units/234mL)  1,000 Units/hr Intravenous  Continuous Nicole Kindred A, DO 10 mL/hr at 06/20/20 1600 1,000 Units/hr at 06/20/20 1600   insulin aspart (novoLOG) injection 0-15 Units  0-15 Units Subcutaneous TID WC Consuelo Pandy, PA-C   3 Units at 06/20/20 1603   insulin aspart (novoLOG) injection 0-5 Units  0-5 Units Subcutaneous QHS Simmons, Brittainy M, PA-C       insulin glargine (LANTUS) injection 10 Units  10 Units Subcutaneous QHS Debbe Odea, MD   10  Units at 06/19/20 2226   milrinone (PRIMACOR) 20 MG/100 ML (0.2 mg/mL) infusion  0.375 mcg/kg/min Intravenous Continuous Clegg, Amy D, NP 10.67 mL/hr at 06/20/20 1601 0.375 mcg/kg/min at 06/20/20 1601   mupirocin ointment (BACTROBAN) 2 %   Topical Q12H Nicole Kindred A, DO   Given at 06/20/20 0174   norepinephrine (LEVOPHED) 16 mg in 264mL premix infusion  0-40 mcg/min Intravenous Titrated Bensimhon, Shaune Pascal, MD 5.63 mL/hr at 06/20/20 1600 6 mcg/min at 06/20/20 1600   ondansetron (ZOFRAN) injection 4 mg  4 mg Intravenous Q6H PRN Clegg, Amy D, NP   4 mg at 06/18/20 0928   sodium chloride flush (NS) 0.9 % injection 10-40 mL  10-40 mL Intracatheter Q12H Nicole Kindred A, DO   10 mL at 06/20/20 9449   sodium chloride flush (NS) 0.9 % injection 10-40 mL  10-40 mL Intracatheter PRN Nicole Kindred A, DO       tamsulosin (FLOMAX) capsule 0.4 mg  0.4 mg Oral QPC supper Nicole Kindred A, DO   0.4 mg at 06/19/20 1704   vitamin B-12 (CYANOCOBALAMIN) tablet 1,000 mcg  1,000 mcg Oral QHS Rise Patience, MD   1,000 mcg at 06/19/20 2148     Discharge Medications: Please see discharge summary for a list of discharge medications.  Relevant Imaging Results:  Relevant Lab Results:   Additional Information SSN#: [id number] [place] Vaccine 01/24/19 02/14/19 10/15/19 04/29/20  Evelyne Makepeace, LCSWA

## 2020-06-21 DIAGNOSIS — I480 Paroxysmal atrial fibrillation: Secondary | ICD-10-CM

## 2020-06-21 DIAGNOSIS — R570 Cardiogenic shock: Secondary | ICD-10-CM | POA: Diagnosis not present

## 2020-06-21 LAB — COMPREHENSIVE METABOLIC PANEL
ALT: 320 U/L — ABNORMAL HIGH (ref 0–44)
AST: 60 U/L — ABNORMAL HIGH (ref 15–41)
Albumin: 1.9 g/dL — ABNORMAL LOW (ref 3.5–5.0)
Alkaline Phosphatase: 132 U/L — ABNORMAL HIGH (ref 38–126)
Anion gap: 9 (ref 5–15)
BUN: 36 mg/dL — ABNORMAL HIGH (ref 8–23)
CO2: 29 mmol/L (ref 22–32)
Calcium: 7.9 mg/dL — ABNORMAL LOW (ref 8.9–10.3)
Chloride: 91 mmol/L — ABNORMAL LOW (ref 98–111)
Creatinine, Ser: 1.17 mg/dL (ref 0.61–1.24)
GFR, Estimated: >60 mL/min (ref >60)
Glucose, Bld: 224 mg/dL — ABNORMAL HIGH (ref 70–99)
Potassium: 4.4 mmol/L (ref 3.5–5.1)
Sodium: 129 mmol/L — ABNORMAL LOW (ref 135–145)
Total Bilirubin: 0.8 mg/dL (ref 0.3–1.2)
Total Protein: 5.5 g/dL — ABNORMAL LOW (ref 6.5–8.1)

## 2020-06-21 LAB — CBC
HCT: 28.7 % — ABNORMAL LOW (ref 39.0–52.0)
Hemoglobin: 8.8 g/dL — ABNORMAL LOW (ref 13.0–17.0)
MCH: 23.5 pg — ABNORMAL LOW (ref 26.0–34.0)
MCHC: 30.7 g/dL (ref 30.0–36.0)
MCV: 76.5 fL — ABNORMAL LOW (ref 80.0–100.0)
Platelets: 282 10*3/uL (ref 150–400)
RBC: 3.75 MIL/uL — ABNORMAL LOW (ref 4.22–5.81)
RDW: 19.7 % — ABNORMAL HIGH (ref 11.5–15.5)
WBC: 14.1 10*3/uL — ABNORMAL HIGH (ref 4.0–10.5)
nRBC: 0.3 % — ABNORMAL HIGH (ref 0.0–0.2)

## 2020-06-21 LAB — CULTURE, BLOOD (ROUTINE X 2)
Special Requests: ADEQUATE
Special Requests: ADEQUATE

## 2020-06-21 LAB — GLUCOSE, CAPILLARY
Glucose-Capillary: 160 mg/dL — ABNORMAL HIGH (ref 70–99)
Glucose-Capillary: 194 mg/dL — ABNORMAL HIGH (ref 70–99)
Glucose-Capillary: 192 mg/dL — ABNORMAL HIGH (ref 70–99)
Glucose-Capillary: 155 mg/dL — ABNORMAL HIGH (ref 70–99)

## 2020-06-21 LAB — HEPARIN LEVEL (UNFRACTIONATED)
Heparin Unfractionated: 0.5 IU/mL (ref 0.30–0.70)
Heparin Unfractionated: 0.35 IU/mL (ref 0.30–0.70)
Heparin Unfractionated: 0.42 IU/mL (ref 0.30–0.70)

## 2020-06-21 LAB — COOXEMETRY PANEL
Carboxyhemoglobin: 1.4 % (ref 0.5–1.5)
Methemoglobin: 0.8 % (ref 0.0–1.5)
O2 Saturation: 68.9 %
Total hemoglobin: 8.7 g/dL — ABNORMAL LOW (ref 12.0–16.0)

## 2020-06-21 LAB — APTT
aPTT: 53 seconds — ABNORMAL HIGH (ref 24–36)
aPTT: 45 seconds — ABNORMAL HIGH (ref 24–36)
aPTT: 81 seconds — ABNORMAL HIGH (ref 24–36)

## 2020-06-21 LAB — MAGNESIUM: Magnesium: 2.1 mg/dL (ref 1.7–2.4)

## 2020-06-21 MED ORDER — FUROSEMIDE 40 MG PO TABS
40.0000 mg | ORAL_TABLET | Freq: Every day | ORAL | Status: DC
  Administered 2020-06-21 – 2020-06-23 (×3): 40 mg via ORAL
  Filled 2020-06-21 (×3): qty 1

## 2020-06-21 NOTE — Progress Notes (Signed)
ANTICOAGULATION CONSULT NOTE  Pharmacy Consult for heparin Indication: atrial fibrillation  Labs: Recent Labs    06/19/20 0421 06/19/20 1600 06/20/20 0252 06/20/20 1016 06/20/20 1823 06/21/20 0044 06/21/20 1127 06/21/20 2230  HGB 8.7*  --  8.5*  --   --  8.8*  --   --   HCT 27.8*  --  27.5*  --   --  28.7*  --   --   PLT 231  --  228  --   --  282  --   --   APTT 62*   < > 69*   < >  --  53* 45* 81*  HEPARINUNFRC >1.10*  --  0.68  --   --  0.50 0.35 0.42  CREATININE 1.75*  --  1.24  --  1.17 1.17  --   --    < > = values in this interval not displayed.      Assessment: 80 yo M on apixaban PTA for afib now holding for cardiogenic shock. Last dose of apixaban 6/12 at 10am. Pharmacy consulted for heparin.   aPTT trending down at 69  - drawn central line, heparin running peripheral. Heparin level closer to correlating but still on higher end of range whereas aPTT on lower end. Hgb 8.5, plt 228. Urine having increased red tint, reddish smear with bowel movement - watching both closely.   6/18 PM update:  aPTT therapeutic   Goal of Therapy:  Heparin level 0.3-0.7 units/ml aPTT 66-102 seconds Monitor platelets by anticoagulation protocol: Yes   Plan:  Cont heparin at 1300 units/hr Confirmatory aPTT/heparin level with AM labs Monitor for signs/symptoms of bleeding  F/u restart apixaban   Narda Bonds, PharmD, BCPS Clinical Pharmacist Phone: [phone]

## 2020-06-21 NOTE — Progress Notes (Signed)
Advanced Heart Failure Rounding Note  PCP-Cardiologist: Sinclair Grooms, MD   Subjective:   Cardiogenic shock. New onset AF.   6/12 CO-OX 43% --->Started on milrinone 0.25 mcg.  6/13 Milrinone increased to 0.375. Co-ox 58% today.  6/15 Back in Afib w/ RVR, amio rebolused, rate increased to 60  6/15 Fever spike + leukocytosis + hypotension>>started on empiric cefepime + NE. BCx with proteus 6/17 TEE/DC-CV  Underwent TEE/DC-CV yesterday. Remains in NSR. (Had brief AF overnight)  On NE 5 + milrinone 0.375. Co-ox 69%. CVP 10   Wt stable. SCr continues has normalized.   Denies CP or SOB   Objective:   Weight Range: 92.3 kg Body mass index is 30.94 kg/m.   Vital Signs:   Temp:  [97.6 F (36.4 C)-98.8 F (37.1 C)] 98 F (36.7 C) (06/18 0700) Pulse Rate:  [41-123] 82 (06/18 0700) Resp:  [11-36] 19 (06/18 0700) BP: (88-182)/(42-155) 97/50 (06/18 0645) SpO2:  [86 %-100 %] 100 % (06/18 0700) Weight:  [92.1 kg-92.3 kg] 92.3 kg (06/18 0424) Last BM Date: 06/20/20  Weight change: Filed Weights   06/20/20 0322 06/20/20 1332 06/21/20 0424  Weight: 92.1 kg 92.1 kg 92.3 kg    Intake/Output:   Intake/Output Summary (Last 24 hours) at 06/21/2020 0855 Last data filed at 06/21/2020 0700 Gross per 24 hour  Intake 2315.99 ml  Output 3275 ml  Net -959.01 ml       Physical Exam   General:  Sitting up in bed. No resp difficulty HEENT: normal Neck: supple. JVP to jaw . Carotids 2+ bilat; no bruits. No lymphadenopathy or thryomegaly appreciated. Cor: PMI nondisplaced. Regular rate & rhythm. No rubs, gallops or murmurs. Lungs: clear Abdomen: soft, nontender, nondistended. No hepatosplenomegaly. No bruits or masses. Good bowel sounds. Extremities: no cyanosis, clubbing, rash, edema + Foley Neuro: alert & orientedx3, cranial nerves grossly intact. moves all 4 extremities w/o difficulty. Affect pleasant   Telemetry   NSR 80s Personally reviewed   Labs    CBC Recent  Labs    06/20/20 0252 06/21/20 0044  WBC 14.3* 14.1*  HGB 8.5* 8.8*  HCT 27.5* 28.7*  MCV 76.8* 76.5*  PLT 228 937    Basic Metabolic Panel Recent Labs    06/20/20 0252 06/20/20 1823 06/21/20 0044  NA 124* 125* 129*  K 3.6 3.8 4.4  CL 88* 87* 91*  CO2 27 28 29   GLUCOSE 270* 317* 224*  BUN 45* 37* 36*  CREATININE 1.24 1.17 1.17  CALCIUM 7.3* 7.2* 7.9*  MG 1.9  --  2.1    Liver Function Tests Recent Labs    06/20/20 0252 06/21/20 0044  AST 83* 60*  ALT 461* 320*  ALKPHOS 116 132*  BILITOT 1.3* 0.8  PROT 5.4* 5.5*  ALBUMIN 1.9* 1.9*    No results for input(s): LIPASE, AMYLASE in the last 72 hours. Cardiac Enzymes No results for input(s): CKTOTAL, CKMB, CKMBINDEX, TROPONINI in the last 72 hours.  BNP: BNP (last 3 results) Recent Labs    05/26/20 1429 06/15/20 0729  BNP 1,609.8* 2,424.4*     ProBNP (last 3 results) Recent Labs    07/11/19 1107 04/23/20 1446  PROBNP 445 2,488*      D-Dimer No results for input(s): DDIMER in the last 72 hours. Hemoglobin A1C No results for input(s): HGBA1C in the last 72 hours. Fasting Lipid Panel No results for input(s): CHOL, HDL, LDLCALC, TRIG, CHOLHDL, LDLDIRECT in the last 72 hours. Thyroid Function Tests No results  for input(s): TSH, T4TOTAL, T3FREE, THYROIDAB in the last 72 hours.  Invalid input(s): FREET3   Other results:   Imaging    No results found.   Medications:     Scheduled Medications:  Chlorhexidine Gluconate Cloth  6 each Topical Q0600   insulin aspart  0-15 Units Subcutaneous TID WC   insulin aspart  0-5 Units Subcutaneous QHS   insulin glargine  10 Units Subcutaneous QHS   mupirocin ointment   Topical Q12H   sodium chloride flush  10-40 mL Intracatheter Q12H   tamsulosin  0.4 mg Oral QPC supper   vitamin B-12  1,000 mcg Oral QHS    Infusions:  sodium chloride 10 mL/hr at 06/21/20 0700   amiodarone 60 mg/hr (06/21/20 0700)   cefTRIAXone (ROCEPHIN)  IV Stopped (06/20/20  2030)   heparin 1,100 Units/hr (06/21/20 0700)   milrinone 0.375 mcg/kg/min (06/21/20 0700)   norepinephrine (LEVOPHED) Adult infusion 4 mcg/min (06/21/20 0700)    PRN Medications:   Assessment/Plan    Acute systolic HF -> 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 Lactate 9.9  - doubt ischemic as hstrop low despite profound hemodynamic support but given h/p CAD and multiple CRFs will need ischemic eval once more stable and renal function improves - On NE 4 milrinone 0.375. Co-ox 69% Wean NE as tolerated - volume status mildly elevated. CVP 10. Start po lasix  - Off GDMT due to need for NE     2. CAD - s/p previous LAD stenting - doubt ischemia is major factor in HF given low hsTrop - plan as above - continue ASA - statin on hold with shock liver - no current s/s of ischemia    3. AF with RVR - Eliquis stopped 6/12 and switched to heparin.  - TEE/DCCV on 6/16  - Continue IV amio while on inotropes - Switch to Eliquis tomorrow   4. AKI - due to ATN and shock - peak creatine 2.9 - resolved  5. Shock liver - resolved   6. DM2 - SSI    7. Hypokalemia  - K 3.6 - supp KCl   8. Sepsis/GN Bacteremia  - PCT 2.7. CXR w/o infiltrate. Foley replaced . BCx Proteus Species.  - Abx narrowed from cefepime to ceftriaxone per ID. Treat x 7 days  - sepsis improving   9. Urinary retention - failed Foley removal - continue Flomax   10. Wound on left foot - deep but clean based - WC recs bid dressing changes   11. Hyponatremia - Na 124-> 129 after tolvaptan, mentating ok  - fluid restrict - follow BMET  12. Code status - Full Code  CRITICAL CARE Performed by: Glori Bickers  Total critical care time: 35 minutes  Critical care time was exclusive of separately billable procedures and  treating other patients.  Critical care was necessary to treat or prevent imminent or life-threatening deterioration.  Critical care was time spent personally by me (independent of midlevel providers or residents) on the following activities: development of treatment plan with patient and/or surrogate as well as nursing, discussions with consultants, evaluation of patient's response to treatment, examination of patient, obtaining history from patient or surrogate, ordering and performing treatments and interventions, ordering and review of laboratory studies, ordering and review of radiographic studies, pulse oximetry and re-evaluation of patient's condition.  Length of Stay: [place], MD  06/21/2020, 8:55 AM  Advanced Heart Failure Team Pager [phone] (M-F; 7a - 5p)  Please contact [facility] for night-coverage after hours (5p -7a ) and weekends on [email or web address]

## 2020-06-21 NOTE — Progress Notes (Signed)
[place] for heparin Indication: atrial fibrillation  Labs: Recent Labs    06/19/20 0421 06/19/20 1600 06/20/20 0252 06/20/20 1016 06/20/20 1221 06/20/20 1823 06/21/20 0044  HGB 8.7*  --  8.5*  --   --   --  8.8*  HCT 27.8*  --  27.5*  --   --   --  28.7*  PLT 231  --  228  --   --   --  282  APTT 62*   < > 69* 49* 45*  --  53*  HEPARINUNFRC >1.10*  --  0.68  --   --   --  0.50  CREATININE 1.75*  --  1.24  --   --  1.17 1.17   < > = values in this interval not displayed.      Assessment: 80 yo M on apixaban PTA for afib now holding for cardiogenic shock. Last dose of apixaban 6/12 at 10am. Pharmacy consulted for heparin.   aPTT trending down at 69  - drawn central line, heparin running peripheral. Heparin level closer to correlating but still on higher end of range whereas aPTT on lower end. Hgb 8.5, plt 228. Urine having increased red tint, reddish smear with bowel movement - watching both closely.   6/18 AM update:  aPTT low Hgb stable from yesterday   Goal of Therapy:  Heparin level 0.3-0.7 units/ml aPTT 66-102 seconds Monitor platelets by anticoagulation protocol: Yes   Plan:  Inc heparin to 1100 units/hr F/u aPTT in 8 hours  Monitor daily aPTT, HL, CBC/plt Monitor for signs/symptoms of bleeding  F/u restart apixaban after procedures   Narda Bonds, PharmD, BCPS Clinical Pharmacist Phone: [phone]

## 2020-06-21 NOTE — Progress Notes (Signed)
ANTICOAGULATION CONSULT NOTE  Pharmacy Consult for heparin Indication: atrial fibrillation  Labs: Recent Labs    06/19/20 0421 06/19/20 1600 06/20/20 0252 06/20/20 1016 06/20/20 1823 06/21/20 0044 06/21/20 1127 06/21/20 2230  HGB 8.7*  --  8.5*  --   --  8.8*  --   --   HCT 27.8*  --  27.5*  --   --  28.7*  --   --   PLT 231  --  228  --   --  282  --   --   APTT 62*   < > 69*   < >  --  53* 45* 81*  HEPARINUNFRC >1.10*  --  0.68  --   --  0.50 0.35 0.42  CREATININE 1.75*  --  1.24  --  1.17 1.17  --   --    < > = values in this interval not displayed.      Assessment: 80 yo M with h/o Afib, Eliquis on hold, for heparin.   Goal of Therapy:  Heparin level 0.3-0.7 units/ml aPTT 66-102 seconds Monitor platelets by anticoagulation protocol: Yes   Plan:  Continue Heparin at current rate    Phillis Knack, PharmD, BCPS

## 2020-06-21 NOTE — Progress Notes (Addendum)
ANTICOAGULATION CONSULT NOTE  Pharmacy Consult for heparin Indication: atrial fibrillation  Labs: Recent Labs    06/19/20 0421 06/19/20 1600 06/20/20 0252 06/20/20 1016 06/20/20 1221 06/20/20 1823 06/21/20 0044 06/21/20 1127  HGB 8.7*  --  8.5*  --   --   --  8.8*  --   HCT 27.8*  --  27.5*  --   --   --  28.7*  --   PLT 231  --  228  --   --   --  282  --   APTT 62*   < > 69*   < > 45*  --  53* 45*  HEPARINUNFRC >1.10*  --  0.68  --   --   --  0.50 0.35  CREATININE 1.75*  --  1.24  --   --  1.17 1.17  --    < > = values in this interval not displayed.      Assessment: 80 yo M on apixaban PTA for afib now holding for cardiogenic shock. Last dose of apixaban 6/12 at 10am. Pharmacy consulted for heparin.  aPTT and heparin still not quite correlating. aPTT is below goal at 45 seconds. Confirmed drawn appropriately.   Goal of Therapy:  Heparin level 0.3-0.7 units/ml aPTT 66-102 seconds Monitor platelets by anticoagulation protocol: Yes   Plan:  Inc heparin to 1300 units/hr Recheck aPTT in 8h Planning to switch to apixaban tomorrow   Arrie Senate, PharmD, BCPS, [facility] Clinical Pharmacist [phone] Please check AMION for all [facility] Pharmacy numbers 06/21/2020

## 2020-06-22 ENCOUNTER — Encounter (HOSPITAL_COMMUNITY): Payer: Self-pay | Admitting: Internal Medicine

## 2020-06-22 DIAGNOSIS — N179 Acute kidney failure, unspecified: Secondary | ICD-10-CM | POA: Diagnosis not present

## 2020-06-22 DIAGNOSIS — I509 Heart failure, unspecified: Secondary | ICD-10-CM | POA: Diagnosis not present

## 2020-06-22 DIAGNOSIS — R531 Weakness: Secondary | ICD-10-CM | POA: Diagnosis not present

## 2020-06-22 DIAGNOSIS — Z7189 Other specified counseling: Secondary | ICD-10-CM | POA: Diagnosis not present

## 2020-06-22 DIAGNOSIS — R570 Cardiogenic shock: Secondary | ICD-10-CM | POA: Diagnosis not present

## 2020-06-22 LAB — COMPREHENSIVE METABOLIC PANEL
ALT: 200 U/L — ABNORMAL HIGH (ref 0–44)
AST: 46 U/L — ABNORMAL HIGH (ref 15–41)
Albumin: 1.7 g/dL — ABNORMAL LOW (ref 3.5–5.0)
Alkaline Phosphatase: 129 U/L — ABNORMAL HIGH (ref 38–126)
Anion gap: 11 (ref 5–15)
BUN: 23 mg/dL (ref 8–23)
CO2: 28 mmol/L (ref 22–32)
Calcium: 7.6 mg/dL — ABNORMAL LOW (ref 8.9–10.3)
Chloride: 90 mmol/L — ABNORMAL LOW (ref 98–111)
Creatinine, Ser: 1.03 mg/dL (ref 0.61–1.24)
GFR, Estimated: >60 mL/min (ref >60)
Glucose, Bld: 249 mg/dL — ABNORMAL HIGH (ref 70–99)
Potassium: 3.1 mmol/L — ABNORMAL LOW (ref 3.5–5.1)
Sodium: 129 mmol/L — ABNORMAL LOW (ref 135–145)
Total Bilirubin: 0.8 mg/dL (ref 0.3–1.2)
Total Protein: 4.9 g/dL — ABNORMAL LOW (ref 6.5–8.1)

## 2020-06-22 LAB — COOXEMETRY PANEL
Carboxyhemoglobin: 1.5 % (ref 0.5–1.5)
Methemoglobin: 0.9 % (ref 0.0–1.5)
O2 Saturation: 64.3 %
Total hemoglobin: 8.5 g/dL — ABNORMAL LOW (ref 12.0–16.0)

## 2020-06-22 LAB — CBC
HCT: 27.4 % — ABNORMAL LOW (ref 39.0–52.0)
Hemoglobin: 8.6 g/dL — ABNORMAL LOW (ref 13.0–17.0)
MCH: 24.2 pg — ABNORMAL LOW (ref 26.0–34.0)
MCHC: 31.4 g/dL (ref 30.0–36.0)
MCV: 77 fL — ABNORMAL LOW (ref 80.0–100.0)
Platelets: 221 10*3/uL (ref 150–400)
RBC: 3.56 MIL/uL — ABNORMAL LOW (ref 4.22–5.81)
RDW: 19.9 % — ABNORMAL HIGH (ref 11.5–15.5)
WBC: 11.9 10*3/uL — ABNORMAL HIGH (ref 4.0–10.5)
nRBC: 0.3 % — ABNORMAL HIGH (ref 0.0–0.2)

## 2020-06-22 LAB — GLUCOSE, CAPILLARY
Glucose-Capillary: 153 mg/dL — ABNORMAL HIGH (ref 70–99)
Glucose-Capillary: 154 mg/dL — ABNORMAL HIGH (ref 70–99)
Glucose-Capillary: 145 mg/dL — ABNORMAL HIGH (ref 70–99)
Glucose-Capillary: 190 mg/dL — ABNORMAL HIGH (ref 70–99)

## 2020-06-22 LAB — MAGNESIUM: Magnesium: 1.5 mg/dL — ABNORMAL LOW (ref 1.7–2.4)

## 2020-06-22 MED ORDER — MAGNESIUM SULFATE 4 GM/100ML IV SOLN
4.0000 g | Freq: Once | INTRAVENOUS | Status: AC
  Administered 2020-06-22: 4 g via INTRAVENOUS
  Filled 2020-06-22: qty 100

## 2020-06-22 MED ORDER — POTASSIUM CHLORIDE 10 MEQ/50ML IV SOLN
10.0000 meq | INTRAVENOUS | Status: AC
  Administered 2020-06-22 (×4): 10 meq via INTRAVENOUS
  Filled 2020-06-22: qty 50

## 2020-06-22 MED ORDER — POTASSIUM CHLORIDE CRYS ER 20 MEQ PO TBCR
40.0000 meq | EXTENDED_RELEASE_TABLET | Freq: Every day | ORAL | Status: DC
  Administered 2020-06-22 – 2020-06-26 (×5): 40 meq via ORAL
  Filled 2020-06-22 (×2): qty 2
  Filled 2020-06-22: qty 4
  Filled 2020-06-22 (×3): qty 2

## 2020-06-22 MED ORDER — POTASSIUM CHLORIDE CRYS ER 20 MEQ PO TBCR
40.0000 meq | EXTENDED_RELEASE_TABLET | Freq: Once | ORAL | Status: AC
  Administered 2020-06-22: 40 meq via ORAL
  Filled 2020-06-22: qty 2

## 2020-06-22 MED ORDER — ATORVASTATIN CALCIUM 40 MG PO TABS
40.0000 mg | ORAL_TABLET | Freq: Every day | ORAL | Status: DC
  Administered 2020-06-22 – 2020-07-09 (×18): 40 mg via ORAL
  Filled 2020-06-22 (×18): qty 1

## 2020-06-22 MED ORDER — POTASSIUM CHLORIDE CRYS ER 20 MEQ PO TBCR: 40.0000 meq | EXTENDED_RELEASE_TABLET | Freq: Once | ORAL | Status: DC

## 2020-06-22 MED ORDER — APIXABAN 5 MG PO TABS
5.0000 mg | ORAL_TABLET | Freq: Two times a day (BID) | ORAL | Status: DC
  Administered 2020-06-22 – 2020-07-06 (×29): 5 mg via ORAL
  Filled 2020-06-22 (×29): qty 1

## 2020-06-22 NOTE — Progress Notes (Signed)
Advanced Heart Failure Rounding Note  PCP-Cardiologist: Sinclair Grooms, MD   Subjective:   Cardiogenic shock. New onset AF.   6/12 CO-OX 43% --->Started on milrinone 0.25 mcg.  6/13 Milrinone increased to 0.375. Co-ox 58% today.  6/15 Back in Afib w/ RVR, amio rebolused, rate increased to 60  6/15 Fever spike + leukocytosis + hypotension>>started on empiric cefepime + NE. BCx with proteus 6/17 TEE/DC-CV 6/19 Back in AF  Off NE. Remains on milrinone 0.375. Co-ox 64% CVP 8-9  Denies CP or SOB  Rash on R hand    Objective:   Weight Range: 93.3 kg Body mass index is 31.27 kg/m.   Vital Signs:   Temp:  [97.4 F (36.3 C)-98.4 F (36.9 C)] 97.7 F (36.5 C) (06/19 0700) Pulse Rate:  [51-103] 103 (06/19 0600) Resp:  [12-28] 28 (06/19 0600) BP: (93-133)/(53-101) 106/68 (06/19 0600) SpO2:  [90 %-100 %] 99 % (06/19 0600) Weight:  [93.3 kg] 93.3 kg (06/19 0321) Last BM Date: 06/20/20  Weight change: Filed Weights   06/20/20 1332 06/21/20 0424 06/22/20 0321  Weight: 92.1 kg 92.3 kg 93.3 kg    Intake/Output:   Intake/Output Summary (Last 24 hours) at 06/22/2020 0934 Last data filed at 06/22/2020 0801 Gross per 24 hour  Intake 2628.45 ml  Output 3675 ml  Net -1046.55 ml       Physical Exam   General:  Sitting up in bed No resp difficulty HEENT: normal Neck: supple. JVP 8-9 Carotids 2+ bilat; no bruits. No lymphadenopathy or thryomegaly appreciated. Cor: PMI nondisplaced. Irregular rate & rhythm. No rubs, gallops or murmurs. Lungs: clear Abdomen: soft, nontender, nondistended. No hepatosplenomegaly. No bruits or masses. Good bowel sounds. Extremities: no cyanosis, clubbing, rash, edema petechial rash on R hand. Sensation/pulses/strength intact Neuro: alert & orientedx3, cranial nerves grossly intact. moves all 4 extremities w/o difficulty. Affect pleasant  Telemetry   AF 90-100 Personally reviewed   Labs    CBC Recent Labs    06/21/20 0044  06/22/20 0312  WBC 14.1* 11.9*  HGB 8.8* 8.6*  HCT 28.7* 27.4*  MCV 76.5* 77.0*  PLT 282 675    Basic Metabolic Panel Recent Labs    06/21/20 0044 06/22/20 0312  NA 129* 129*  K 4.4 3.1*  CL 91* 90*  CO2 29 28  GLUCOSE 224* 249*  BUN 36* 23  CREATININE 1.17 1.03  CALCIUM 7.9* 7.6*  MG 2.1 1.5*    Liver Function Tests Recent Labs    06/21/20 0044 06/22/20 0312  AST 60* 46*  ALT 320* 200*  ALKPHOS 132* 129*  BILITOT 0.8 0.8  PROT 5.5* 4.9*  ALBUMIN 1.9* 1.7*    No results for input(s): LIPASE, AMYLASE in the last 72 hours. Cardiac Enzymes No results for input(s): CKTOTAL, CKMB, CKMBINDEX, TROPONINI in the last 72 hours.  BNP: BNP (last 3 results) Recent Labs    05/26/20 1429 06/15/20 0729  BNP 1,609.8* 2,424.4*     ProBNP (last 3 results) Recent Labs    07/11/19 1107 04/23/20 1446  PROBNP 445 2,488*      D-Dimer No results for input(s): DDIMER in the last 72 hours. Hemoglobin A1C No results for input(s): HGBA1C in the last 72 hours. Fasting Lipid Panel No results for input(s): CHOL, HDL, LDLCALC, TRIG, CHOLHDL, LDLDIRECT in the last 72 hours. Thyroid Function Tests No results for input(s): TSH, T4TOTAL, T3FREE, THYROIDAB in the last 72 hours.  Invalid input(s): FREET3   Other results:   Imaging  No results found.   Medications:     Scheduled Medications:  Chlorhexidine Gluconate Cloth  6 each Topical Q0600   furosemide  40 mg Oral Daily   insulin aspart  0-15 Units Subcutaneous TID WC   insulin aspart  0-5 Units Subcutaneous QHS   insulin glargine  10 Units Subcutaneous QHS   mupirocin ointment   Topical Q12H   potassium chloride SA  40 mEq Oral Daily   sodium chloride flush  10-40 mL Intracatheter Q12H   tamsulosin  0.4 mg Oral QPC supper   vitamin B-12  1,000 mcg Oral QHS    Infusions:  sodium chloride Stopped (06/22/20 0518)   amiodarone 60 mg/hr (06/22/20 0600)   cefTRIAXone (ROCEPHIN)  IV Stopped (06/21/20  2057)   heparin 1,300 Units/hr (06/22/20 0600)   milrinone 0.375 mcg/kg/min (06/22/20 0600)   norepinephrine (LEVOPHED) Adult infusion Stopped (06/21/20 1554)    PRN Medications:   Assessment/Plan    Acute systolic HF -> 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 Lactate 9.9  - doubt ischemic as hstrop low despite profound hemodynamic support but given h/p CAD and multiple CRFs will need ischemic eval once more stable and renal function improves - Off NE. On milrinone 0.375. Co-ox 64% Wean milrinone to 0.25 - volume status ok continue po lasix - Off GDMT due to need for NE  - Can go to SDU   2. CAD - s/p previous LAD stenting - doubt ischemia is major factor in HF given low hsTrop - plan as above - continue ASA - statin on hold with shock liver. Can restart today - no current s/s of ischemia    3. AF with RVR - Eliquis stopped 6/12 and switched to heparin.  - TEE/DCCV on 6/17. Back in AF overnight - Continue IV amio while on inotropes - Continue Eliquis  - Will plan one more attempt at [facility] early this week - May need to discuss AVN ablation w/CRT-P with EP   4. AKI - due to ATN and shock - peak creatine 2.9 - resolved  5. Shock liver - resolved   6. DM2 - SSI    7. Hypokalemia  - K 3.1 - supp KCl   8. Sepsis/GN Bacteremia  - PCT 2.7. CXR w/o infiltrate. Foley replaced . BCx Proteus Species.  - Abx narrowed from cefepime to ceftriaxone per ID. Treat x 7 days  - sepsis improving   9. Urinary retention - failed Foley removal - continue Flomax  - can try voiding trial again tomorrow  10. Wound on left foot - deep but clean based - WC recs bid dressing changes   11. Hyponatremia - Na 124-> 129 after tolvaptan, mentating ok  - fluid restrict - follow BMET  12. Code status -  Full Code   Length of Stay: [place], MD  06/22/2020, 9:34 AM  Advanced Heart Failure Team Pager [phone] (M-F; 7a - 5p)  Please contact [facility] for night-coverage after hours (5p -7a ) and weekends on [email or web address]

## 2020-06-22 NOTE — Progress Notes (Signed)
Patient ID: Wesley Winters, male   DOB: May 13, 1940, 80 y.o.   MRN: [id number]    Progress Note from the Palliative Medicine Team at [facility]   Patient Name: Wesley Winters        Date: 06/22/2020 DOB: 03-15-1940  Age: 80 y.o. MRN#: [id number] Attending Physician: Ezekiel Slocumb, DO Primary Care Physician: [name], MD Admit Date: 06/14/2020   Medical records reviewed   80 y.o. male   admitted on 06/14/2020 with history of cardiomyopathy with EF of 25% likely tachycardia related with history of A. fib, CAD status post stenting, diabetes mellitus was recently admitted for CHF and also underwent cardioversion discharged on Jun 03, 2020 about 10 days ago has been having poor appetite and weakness, and three reported falls per his wife.   Complaining of the weakness, patient was brought to the ER.  Patient reports poor appetite and nausea,  denies any abdominal pain.     In the ER patient was found to have intermittent episodes of A. fib with RVR labs are significant for creatinine worsening from 1.2 about 4 days ago and it is around 2.6.  LFTs are elevated at AST of 110 ALT of 87 total bilirubin 1.2.  CBC is around baseline.  Patient was given to 50 cc normal saline bolus given that patient had acute renal failure.  Abdomen appears benign.  COVID test was negative.  Patient underwent CT head and CT chest abdomen pelvis which does not show any acute.  Admitted for treatment and stabilization.  Currently on Milrinone, Amiodarone, Heparin   He reports weakness.  Patient and family face treatment option decisions, advanced directive decisions and anticipatory care needs.   This NP visited patient at the bedside as a follow up to  yesterday's [place].  Patient is alert and oriented, his daughter and [place] are at the bedside  Patient reports weakness in his legs.  He is alert and oriented.  Continue education offered regarding current medical situation.  Family verbalize concern for "what  the next steps are" in the treatment plan.    Patient will need to be medically stabilized prior to discharge.  [name] is open to short-term rehab at [facility] on discharge.  Education offered on outpatient community-based palliative care services  Discussed with patient the importance of continued conversation with his family and the  medical providers regarding overall plan of care and treatment options,  ensuring decisions are within the context of the patients values and GOCs.  Education offered on the importance of continued conversation and documentation of his advance care planning specific to [place].  Questions and concerns addressed   PMT will continue to support holistically   Total time spent on the unit was 35 minutes    Greater than 50% of the time was spent in counseling and coordination of care  [name] NP  Palliative Medicine Team Team Phone # [phone] Pager [phone]

## 2020-06-23 DIAGNOSIS — I480 Paroxysmal atrial fibrillation: Secondary | ICD-10-CM | POA: Diagnosis not present

## 2020-06-23 DIAGNOSIS — R570 Cardiogenic shock: Secondary | ICD-10-CM | POA: Diagnosis not present

## 2020-06-23 LAB — CBC
HCT: 29.5 % — ABNORMAL LOW (ref 39.0–52.0)
Hemoglobin: 9.1 g/dL — ABNORMAL LOW (ref 13.0–17.0)
MCH: 23.8 pg — ABNORMAL LOW (ref 26.0–34.0)
MCHC: 30.8 g/dL (ref 30.0–36.0)
MCV: 77 fL — ABNORMAL LOW (ref 80.0–100.0)
Platelets: 230 10*3/uL (ref 150–400)
RBC: 3.83 MIL/uL — ABNORMAL LOW (ref 4.22–5.81)
RDW: 20 % — ABNORMAL HIGH (ref 11.5–15.5)
WBC: 12.4 10*3/uL — ABNORMAL HIGH (ref 4.0–10.5)
nRBC: 0.4 % — ABNORMAL HIGH (ref 0.0–0.2)

## 2020-06-23 LAB — COMPREHENSIVE METABOLIC PANEL
ALT: 160 U/L — ABNORMAL HIGH (ref 0–44)
AST: 33 U/L (ref 15–41)
Albumin: 1.8 g/dL — ABNORMAL LOW (ref 3.5–5.0)
Alkaline Phosphatase: 132 U/L — ABNORMAL HIGH (ref 38–126)
Anion gap: 8 (ref 5–15)
BUN: 21 mg/dL (ref 8–23)
CO2: 28 mmol/L (ref 22–32)
Calcium: 7.4 mg/dL — ABNORMAL LOW (ref 8.9–10.3)
Chloride: 91 mmol/L — ABNORMAL LOW (ref 98–111)
Creatinine, Ser: 1.03 mg/dL (ref 0.61–1.24)
GFR, Estimated: >60 mL/min (ref >60)
Glucose, Bld: 263 mg/dL — ABNORMAL HIGH (ref 70–99)
Potassium: 3.9 mmol/L (ref 3.5–5.1)
Sodium: 127 mmol/L — ABNORMAL LOW (ref 135–145)
Total Bilirubin: 0.9 mg/dL (ref 0.3–1.2)
Total Protein: 5.4 g/dL — ABNORMAL LOW (ref 6.5–8.1)

## 2020-06-23 LAB — COOXEMETRY PANEL
Carboxyhemoglobin: 1.1 % (ref 0.5–1.5)
Carboxyhemoglobin: 1.2 % (ref 0.5–1.5)
Methemoglobin: 0.7 % (ref 0.0–1.5)
Methemoglobin: 1.1 % (ref 0.0–1.5)
O2 Saturation: 52.1 %
O2 Saturation: 51.5 %
Total hemoglobin: 9.4 g/dL — ABNORMAL LOW (ref 12.0–16.0)
Total hemoglobin: 9.2 g/dL — ABNORMAL LOW (ref 12.0–16.0)

## 2020-06-23 LAB — GLUCOSE, CAPILLARY
Glucose-Capillary: 230 mg/dL — ABNORMAL HIGH (ref 70–99)
Glucose-Capillary: 124 mg/dL — ABNORMAL HIGH (ref 70–99)
Glucose-Capillary: 130 mg/dL — ABNORMAL HIGH (ref 70–99)

## 2020-06-23 LAB — MAGNESIUM: Magnesium: 1.8 mg/dL (ref 1.7–2.4)

## 2020-06-23 MED ORDER — SENNA 8.6 MG PO TABS
2.0000 | ORAL_TABLET | Freq: Every day | ORAL | Status: DC
  Administered 2020-06-23 – 2020-06-27 (×3): 17.2 mg via ORAL
  Filled 2020-06-23 (×3): qty 2

## 2020-06-23 MED ORDER — DOCUSATE SODIUM 100 MG PO CAPS
200.0000 mg | ORAL_CAPSULE | Freq: Two times a day (BID) | ORAL | Status: DC
  Administered 2020-06-23 – 2020-06-27 (×7): 200 mg via ORAL
  Filled 2020-06-23 (×7): qty 2

## 2020-06-23 NOTE — Progress Notes (Addendum)
Advanced Heart Failure Rounding Note  PCP-Cardiologist: Sinclair Grooms, MD   Subjective:   Cardiogenic shock. New onset AF.   6/12 CO-OX 43% --->Started on milrinone 0.25 mcg.  6/13 Milrinone increased to 0.375. Co-ox 58% today.  6/15 Back in Afib w/ RVR, amio rebolused, rate increased to 60  6/15 Fever spike + leukocytosis + hypotension>>started on empiric cefepime + NE. BCx with proteus 6/17 TEE/DC-CV briefly in SR 6/19 Back in AF. Milrinone cut back to 0.25 mcg.   Remains on milrinone 0.25 mcg + amio 60 mg per hour. CO-OX 52%.   Denies SOB.   Objective:   Weight Range: 93 kg Body mass index is 31.17 kg/m.   Vital Signs:   Temp:  [97.6 F (36.4 C)-98 F (36.7 C)] 98 F (36.7 C) (06/20 0400) Pulse Rate:  [38-115] 108 (06/20 0700) Resp:  [10-31] 26 (06/20 0700) BP: (82-127)/(47-113) 95/70 (06/20 0700) SpO2:  [72 %-100 %] 100 % (06/20 0700) Weight:  [93 kg] 93 kg (06/20 0209) Last BM Date: 06/20/20  Weight change: Filed Weights   06/21/20 0424 06/22/20 0321 06/23/20 0209  Weight: 92.3 kg 93.3 kg 93 kg    Intake/Output:   Intake/Output Summary (Last 24 hours) at 06/23/2020 0733 Last data filed at 06/23/2020 0700 Gross per 24 hour  Intake 2489.87 ml  Output 2975 ml  Net -485.13 ml      Physical Exam  CVP 9-10  General: Elderly. No resp difficulty HEENT: normal Neck: supple. JVP 9-10 . Carotids 2+ bilat; no bruits. No lymphadenopathy or thryomegaly appreciated. Cor: PMI nondisplaced. Irregular rate & rhythm. No rubs, gallops or murmurs. Lungs: clear Abdomen: soft, nontender, nondistended. No hepatosplenomegaly. No bruits or masses. Good bowel sounds. Extremities: no cyanosis, clubbing, rash, edema. LUE PICC Neuro: alert & orientedx3, cranial nerves grossly intact. moves all 4 extremities w/o difficulty. Affect pleasant Skin: Right fingers with petechiae. Non tender. No edema.    Telemetry    AFib 90-100s Personally reviewed    Labs     CBC Recent Labs    06/22/20 0312 06/23/20 0204  WBC 11.9* 12.4*  HGB 8.6* 9.1*  HCT 27.4* 29.5*  MCV 77.0* 77.0*  PLT 221 010   Basic Metabolic Panel Recent Labs    06/22/20 0312 06/23/20 0204  NA 129* 127*  K 3.1* 3.9  CL 90* 91*  CO2 28 28  GLUCOSE 249* 263*  BUN 23 21  CREATININE 1.03 1.03  CALCIUM 7.6* 7.4*  MG 1.5* 1.8   Liver Function Tests Recent Labs    06/22/20 0312 06/23/20 0204  AST 46* 33  ALT 200* 160*  ALKPHOS 129* 132*  BILITOT 0.8 0.9  PROT 4.9* 5.4*  ALBUMIN 1.7* 1.8*   No results for input(s): LIPASE, AMYLASE in the last 72 hours. Cardiac Enzymes No results for input(s): CKTOTAL, CKMB, CKMBINDEX, TROPONINI in the last 72 hours.  BNP: BNP (last 3 results) Recent Labs    05/26/20 1429 06/15/20 0729  BNP 1,609.8* 2,424.4*    ProBNP (last 3 results) Recent Labs    07/11/19 1107 04/23/20 1446  PROBNP 445 2,488*     D-Dimer No results for input(s): DDIMER in the last 72 hours. Hemoglobin A1C No results for input(s): HGBA1C in the last 72 hours. Fasting Lipid Panel No results for input(s): CHOL, HDL, LDLCALC, TRIG, CHOLHDL, LDLDIRECT in the last 72 hours. Thyroid Function Tests No results for input(s): TSH, T4TOTAL, T3FREE, THYROIDAB in the last 72 hours.  Invalid input(s): FREET3  Other results:   Imaging    No results found.   Medications:     Scheduled Medications:  apixaban  5 mg Oral BID   atorvastatin  40 mg Oral Daily   Chlorhexidine Gluconate Cloth  6 each Topical Q0600   furosemide  40 mg Oral Daily   insulin aspart  0-15 Units Subcutaneous TID WC   insulin aspart  0-5 Units Subcutaneous QHS   insulin glargine  10 Units Subcutaneous QHS   mupirocin ointment   Topical Q12H   potassium chloride SA  40 mEq Oral Daily   sodium chloride flush  10-40 mL Intracatheter Q12H   tamsulosin  0.4 mg Oral QPC supper   vitamin B-12  1,000 mcg Oral QHS    Infusions:  sodium chloride 5 mL/hr at 06/23/20 0700    amiodarone 60 mg/hr (06/23/20 0700)   cefTRIAXone (ROCEPHIN)  IV 200 mL/hr at 06/23/20 0000   milrinone 0.25 mcg/kg/min (06/23/20 0700)    PRN Medications:   Assessment/Plan    Acute systolic HF -> 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 Lactate 9.9  - doubt ischemic as hstrop low despite profound hemodynamic support but given h/p CAD and multiple CRFs will need ischemic eval once more stable and renal function improves - Off NE. On milrinone 0.25 . Co-ox 52%. Repeat CO-OX  - CVP 9-10. Continue po lasix.  - Off GDMT due to need for inotropes  2. CAD - s/p previous LAD stenting - doubt ischemia is major factor in HF given low hsTrop - plan as above - continue ASA - statin on hold with shock liver. Can restart today - no current s/s of ischemia    3. AF with RVR - Eliquis stopped 6/12 and switched to heparin.  - TEE/DCCV on 6/17. Back in AF overnight - Continue IV amio while on inotropes - Continue Eliquis  - Remains in A fib.  - Plan for repeat DC-CV later this week once milrinone off - May need to discuss AVN ablation w/CRT-P with EP   4. AKI - due to ATN and shock - peak creatine 2.9 - Resolved.   5. Shock liver - resolved   6. DM2 - SSI    7. Hypokalemia  -Stable.   8. Sepsis/GN Bacteremia  - PCT 2.7. CXR w/o infiltrate. Foley replaced . BCx Proteus Species.  - Abx narrowed from cefepime to ceftriaxone per ID. Treat x 7 days  - sepsis resolved  9. Urinary retention - failed Foley removal - continue Flomax  - can try voiding trial again tomorrow  10. Wound on left foot - deep but clean based - WC recs bid dressing changes   11. Hyponatremia - Na 124-> 129 ->127  - 6/18  tolvaptan, mentating ok  - fluid restrict - follow BMET  12. Petechial rash on R hand - ? Cholesterol  emboli. - neurovascular intact. No wound - follow   13. Code status - Full Code  SW following. FL2 started.    Length of Stay: [place], NP  06/23/2020, 7:33 AM  Advanced Heart Failure Team Pager [phone] (M-F; 7a - 5p)  Please contact [facility] for night-coverage after hours (5p -7a ) and weekends on [email or web address]  Patient seen and examined with the above-signed Advanced Practice Provider and/or Housestaff. I personally reviewed  laboratory data, imaging studies and relevant notes. I independently examined the patient and formulated the important aspects of the plan. I have edited the note to reflect any of my changes or salient points. I have personally discussed the plan with the patient and/or family.  Remains tenuous. Back in AF after 2nd DC-CV despite amio at 61. Co-ox low at 52% on milrinone. Remains weak. No CP or SOB. CVP 9-10. Foley still in for urinary retention  General:  Weak appearing. No resp difficulty HEENT: normal Neck: supple. JVP 9-10. Carotids 2+ bilat; no bruits. No lymphadenopathy or thryomegaly appreciated. Cor: PMI nondisplaced. Irregular No rubs, gallops or murmurs. Lungs: clear Abdomen: soft, nontender, nondistended. No hepatosplenomegaly. No bruits or masses. Good bowel sounds. Extremities: no cyanosis, clubbing, rash, edema R hand with p[e Neuro: alert & orientedx3, cranial nerves grossly intact. moves all 4 extremities w/o difficulty. Affect pleasant  Can move out of CCU. Will repeat co-ox. If 55% or greater will try to wean milrinone to 0.125.   Continue amio for persistent AF. Will attempt repeat DC-CV if/when milrinone off. Will ask EP to see to discuss AVN ablation/CRT though device implantation is complicated by recent Proteus bacteremia and ongoing  for Foley. However AF is having serious consequences for him.   Can go to [facility]. Needs to ambulate. R hand looks like possible cholesterol emboli but currently stable.   Glori Bickers, MD   8:55 AM

## 2020-06-23 NOTE — TOC Progression Note (Addendum)
Transition of Care (TOC) - Progression Note  Heart Failure   Patient Details  Name: Wesley Winters MRN: [id number] Date of Birth: 07-29-1940  Transition of Care [facility]) CM/SW [place], [place] Phone Number: 06/23/2020, 10:20 AM  Clinical Narrative:    CSW called Wesley Winters [phone] who reported they will not have any beds available until the beginning of July. CSW to present bed offer from [facility] to patient and patient's wife.  10:45am - CSW spoke with the patient and patients spouse and daughter at bedside regarding bed offer from [facility]. CSW informed patient and family that [facility] doesn't have a bed available until July. Patient and family reported understanding and does not want the CSW to extend the SNF search and will stick with [facility] at this time.  3:21pm - CSW spoke with [facility] regarding bed offer made and [facility] reported having beds available and will need to check with the patients insurance to verify and they will let the CSW know.  CSW will continue to follow throughout discharge.   Expected Discharge Plan: [place] Barriers to Discharge: Continued Medical Work up  Expected Discharge Plan and Services Expected Discharge Plan: [place] In-house Referral: Clinical Social Work Discharge Planning Services: CM Consult   Living arrangements for the past 2 months: Single Family Home                                       Social Determinants of Health (SDOH) Interventions    Readmission Risk Interventions No flowsheet data found.  Wesley Winters, MSW, [place] Heart Failure Social Worker

## 2020-06-23 NOTE — Consult Note (Signed)
Cardiology Consultation:   Patient ID: Wesley Winters MRN: [id number]; DOB: 18-Feb-1940  Admit date: 06/14/2020 Date of Consult: 06/23/2020  PCP:  [name], MD   [facility] Providers Cardiologist:  Sinclair Grooms, MD EP; Dr. Curt Bears   Patient Profile:   Wesley Winters is a 80 y.o. male with a hx of HTN, HLD, OSA, CAD (stent to LAD and RCA in 1998 and 1999; patent coronary arteries 0867), new CHF(systolic), AFib, DM, who is being seen 06/23/2020 for the evaluation of AFib at the request of Dr. Haroldine Laws.    History of Present Illness:   Mr. Ramroop AFib diagnosed April 2022 (unknown onset), started on amiodarone planned for rate control to DCCV, during this w/u LVEF noted down to 20-25% (from 45-50% in 2020) developed progressive SOB and > admitted with CHF May 26 2020 >  DCCV 05/28/20, diuresed and discharged 06/03/20.   He saw Dr. Curt Bears 06/13/20 was in AFib, planned to increase his amiodarone and repeat DCCV  Admitted to [facility] 06/14/20 with a couple falls, weakness, poor intake, he arrived in Afib RVR, AKI with Creat 3.2, K+ was 6.6, given IVF, AST  342-> 3005  ALT 239 -> 1397 Developed hypotension requiring pressor support somewhat transiently.  HF was called 2/2 cardiogenic shock, placed back on NE and started on milrinone, amiodarone gtt Abn HS Trop not felt to be acutely ischemic though eventual w/u  CM suspect tachy-mediated  06/18/20 fever > started on empiric abx >. BC came with proteus (x2) Sepsis (notes report per ID to treat 7 days)  TEE/DCCV  06/20/20 LVEF 20-25%, LA 5.0cm  Yesterday off NE back in AFib, planned to start weaning off milrinone  EP is asked to see today for consideration of pace/ablate  Remains on: Milrinone 0.25 Amiodarone gtt at 58 Ceftriaxone    Coox 51.5  LABS K+ 3.9 BUN/Creat 21/1.03 (peak creat 3.21 AST 33 (peak 3132) ALT 160 (peak 1864) WBC 12.4 H/H 9.1/29.5 Plts 230   He is completely unaware of the AFib, feels better each  day No rest SOB Has not had CP   Past Medical History:  Diagnosis Date   Balanitis    Bladder calculus    Coronary artery disease    stents x3 by Dr Tamala Julian 1998   Depression    Diabetes mellitus without complication ([place])    GERD (gastroesophageal reflux disease)    Hypercholesterolemia    Hypertension    Hypomagnesemia 06/03/2020   Phimosis    Prostate cancer ([place])    Sleep apnea    wears CPAP nightly    Past Surgical History:  Procedure Laterality Date   BIOPSY  07/10/2019   Procedure: BIOPSY;  Surgeon: Otis Brace, MD;  Location: [facility];  Service: Gastroenterology;;   CARDIOVERSION N/A 05/28/2020   Procedure: CARDIOVERSION;  Surgeon: [name], MD;  Location: [place];  Service: Cardiovascular;  Laterality: N/A;   CARDIOVERSION N/A 06/20/2020   Procedure: CARDIOVERSION;  Surgeon: Jolaine Artist, MD;  Location: [facility];  Service: Cardiovascular;  Laterality: N/A;   CIRCUMCISION     CYSTOSCOPY     ESOPHAGOGASTRODUODENOSCOPY (EGD) WITH PROPOFOL N/A 07/10/2019   Procedure: ESOPHAGOGASTRODUODENOSCOPY (EGD) WITH PROPOFOL;  Surgeon: Otis Brace, MD;  Location: [facility] ENDOSCOPY;  Service: Gastroenterology;  Laterality: N/A;   HERNIA REPAIR     umbilical   PROSTATE BIOPSY     PROSTATE BIOPSY     PROSTATE BIOPSY     TEE WITHOUT CARDIOVERSION N/A 06/20/2020   Procedure:  TRANSESOPHAGEAL ECHOCARDIOGRAM (TEE);  Surgeon: Jolaine Artist, MD;  Location: [facility] ENDOSCOPY;  Service: Cardiovascular;  Laterality: N/A;   three cardiac stents       Home Medications:  Prior to Admission medications   Medication Sig Start Date End Date Taking? Authorizing Provider  acetaminophen (TYLENOL) 500 MG tablet Take 500-1,000 mg by mouth 3 (three) times daily as needed for moderate pain, mild pain or headache.   Yes [provider]  amiodarone (PACERONE) 200 MG tablet Take 2 tablets (400 mg total) by mouth 2 (two) times daily. 06/13/20  Yes Camnitz, Ocie Doyne,  MD  apixaban (ELIQUIS) 5 MG TABS tablet Take 1 tablet (5 mg total) by mouth 2 (two) times daily. 04/23/20  Yes Belva Crome, MD  atorvastatin (LIPITOR) 40 MG tablet Take 40 mg by mouth daily.   Yes [provider]  azelastine (ASTELIN) 0.1 % nasal spray Place 2 sprays into both nostrils 2 (two) times daily as needed for allergies. Use in each nostril as directed   Yes [provider]  B-D UF III MINI PEN NEEDLES 31G X 5 MM MISC 1 each by Other route every evening. 10/01/19  Yes [provider]  carvedilol (COREG) 3.125 MG tablet Take 1 tablet (3.125 mg total) by mouth 2 (two) times daily with a meal. 05/12/20  Yes Belva Crome, MD  cetirizine (ZYRTEC) 10 MG tablet Take 10 mg by mouth in the morning. 03/28/20  Yes [provider]  cholecalciferol (VITAMIN D3) 25 MCG (1000 UT) tablet Take 1,000 Units by mouth at bedtime.   Yes [provider]  fluticasone (FLONASE) 50 MCG/ACT nasal spray Place 2 sprays into both nostrils daily as needed for allergies.    Yes [provider]  furosemide (LASIX) 20 MG tablet Take 1 tablet (20 mg total) by mouth daily. 06/09/20  Yes Belva Crome, MD  metFORMIN (GLUCOPHAGE-XR) 500 MG 24 hr tablet Take 1,000 mg by mouth 2 (two) times daily.  11/11/14  Yes [provider]  mupirocin ointment (BACTROBAN) 2 % APPLY TO RIGHT GREAT TOE AND RIGHT 3RD TOE ONCE DAILY Patient taking differently: Apply 1 application topically See admin instructions. Apply to right great toe and right 3rd toe once daily 11/14/19  Yes Galaway, Stephani Police, DPM  ONETOUCH ULTRA test strip 1 each 3 (three) times daily. 08/15/19  Yes [provider]  PRESCRIPTION MEDICATION CPAP- At bedtime and during any naps   Yes [provider]  TOUJEO MAX SOLOSTAR 300 UNIT/ML Solostar Pen Inject 25 Units into the skin at bedtime.   Yes [provider]  vitamin B-12 (CYANOCOBALAMIN) 1000 MCG tablet Take 1,000 mcg by mouth at bedtime.    Yes [provider]    Inpatient Medications: Scheduled Meds:  apixaban  5 mg Oral BID   atorvastatin  40 mg Oral Daily   Chlorhexidine Gluconate Cloth  6 each Topical Q0600   docusate sodium  200 mg Oral BID   furosemide  40 mg Oral Daily   insulin aspart  0-15 Units Subcutaneous TID WC   insulin aspart  0-5 Units Subcutaneous QHS   insulin glargine  10 Units Subcutaneous QHS   mupirocin ointment   Topical Q12H   potassium chloride SA  40 mEq Oral Daily   senna  2 tablet Oral Daily   sodium chloride flush  10-40 mL Intracatheter Q12H   tamsulosin  0.4 mg Oral QPC supper   vitamin B-12  1,000 mcg Oral QHS  Continuous Infusions:  sodium chloride Stopped (06/23/20 0817)   amiodarone 60 mg/hr (06/23/20 1000)   cefTRIAXone (ROCEPHIN)  IV 200 mL/hr at 06/23/20 1000   milrinone 0.25 mcg/kg/min (06/23/20 1000)   PRN Meds: acetaminophen, ondansetron (ZOFRAN) IV, sodium chloride flush  Allergies:    Allergies  Allergen Reactions   Metformin Hcl Diarrhea   Other Other (See Comments) and Cough    Pollen- Chest congestion, also   Amoxicillin Hives, Nausea Only and Rash   Amoxicillin-Pot Clavulanate Rash   Empagliflozin Nausea Only, Anxiety and Other (See Comments)    Made the patient jittery and nauseous   Januvia [Sitagliptin] Anxiety    Social History:   Social History   Socioeconomic History   Marital status: Married    Spouse name: Not on file   Number of children: Not on file   Years of education: Not on file   Highest education level: Not on file  Occupational History   Not on file  Tobacco Use   Smoking status: Never   Smokeless tobacco: Never  Substance and Sexual Activity   Alcohol use: No   Drug use: No   Sexual activity: Yes  Other Topics Concern   Not on file  Social History Narrative   Not on file   Social Determinants of Health   Financial Resource Strain: Low Risk    Difficulty of Paying Living Expenses: Not very hard  Food  Insecurity: No Food Insecurity   Worried About Running Out of Food in the Last Year: Never true   Ran Out of Food in the Last Year: Never true  Transportation Needs: No Transportation Needs   Lack of Transportation (Medical): No   Lack of Transportation (Non-Medical): No  Physical Activity: Not on file  Stress: Not on file  Social Connections: Not on file  Intimate Partner Violence: Not on file    Family History:   Family History  Problem Relation Age of Onset   Cancer Brother        prostate   Heart disease Brother    Heart disease Mother    Arrhythmia Mother    Arrhythmia Brother      ROS:  Please see the history of present illness.  All other ROS reviewed and negative.     Physical Exam/Data:   Vitals:   06/23/20 0752 06/23/20 0800 06/23/20 0900 06/23/20 1000  BP:  (!) 79/54 (!) 81/71 113/73  Pulse:  (!) 105 100 (!) 101  Resp:  16 (!) 23 (!) 28  Temp: 98.7 F (37.1 C)     TempSrc: Oral     SpO2:  100% 99% 100%  Weight:      Height:        Intake/Output Summary (Last 24 hours) at 06/23/2020 1038 Last data filed at 06/23/2020 1000 Gross per 24 hour  Intake 2222.56 ml  Output 2655 ml  Net -432.44 ml   Last 3 Weights 06/23/2020 06/22/2020 06/21/2020  Weight (lbs) 205 lb 0.4 oz 205 lb 11 oz 203 lb 7.8 oz  Weight (kg) 93 kg 93.3 kg 92.3 kg     Body mass index is 31.17 kg/m.  General:  Well nourished, well developed, in no acute distress HEENT: normal Lymph: no adenopathy Neck: no JVD Endocrine:  No thryomegaly Vascular: No carotid bruits  Cardiac:  irreg-irreg ; no murmurs, gallops or rubs Lungs:  CTA b/l, no wheezing, rhonchi or rales  Abd: soft, nontender Ext: trace edema Musculoskeletal:  No deformities, BUE and BLE  strength normal and equal Skin: warm and dry, wound LLE toe/sole are dressed Neuro:  no focal abnormalities noted Psych:  Normal affect   EKG:  The EKG was personally reviewed and demonstrates:    Initial AFib 96bpm, LAD, IVCD 06/20/20  AFib 111bpm #2 is SR 81bpm, LBBB (165ms), LAD  Telemetry:  Telemetry was personally reviewed and demonstrates:   AFib generally low 100's  Relevant CV Studies:   06/20/20: TEE/DCCV LEFT VENTRICLE: EF = 20-25% RIGHT VENTRICLE: Moderately HK LEFT ATRIUM: Severely dilated 5.0 cm LEFT ATRIAL APPENDAGE: No clot RIGHT ATRIUM: Moderately dilated AORTIC VALVE:  Trileaflet. Mildly calcified No AI/AS MITRAL VALVE:    Normal severe posterior MR TRICUSPID VALVE: Moderate TR PULMONIC VALVE: Trivial PR INTERATRIAL SEPTUM: No ASD/PFO PERICARDIUM: No effusion DESCENDING AORTA: Moderate to severe plaque >> DCCV   06/15/20: TTE IMPRESSIONS   1. Left ventricular ejection fraction, by estimation, is <20%. The left  ventricle has severely decreased function. The left ventricle demonstrates  global hypokinesis. The left ventricular internal cavity size was severely  dilated. Left ventricular  diastolic function could not be evaluated.   2. Right ventricular systolic function is severely reduced. The right  ventricular size is moderately enlarged. There is moderately elevated  pulmonary artery systolic pressure. The estimated right ventricular  systolic pressure is 42.7 mmHg.   3. Right atrial size was severely dilated.   4. The mitral valve is degenerative. Moderate mitral valve regurgitation.  No evidence of mitral stenosis.   5. The aortic valve is tricuspid. Aortic valve regurgitation is not  visualized. Mild aortic valve sclerosis is present, with no evidence of  aortic valve stenosis.   6. The inferior vena cava is dilated in size with <50% respiratory  variability, suggesting right atrial pressure of 15 mmHg.    05/05/20; TTE IMPRESSIONS   1. Left ventricular ejection fraction, by estimation, is 20 to 25%. The  left ventricle has severely decreased function. The left ventricle  demonstrates global hypokinesis. The left ventricular internal cavity size  was moderately dilated. Left   ventricular diastolic parameters are indeterminate. Elevated left  ventricular end-diastolic pressure.   2. Right ventricular systolic function is moderately reduced. The right  ventricular size is normal. There is mildly elevated pulmonary artery  systolic pressure.   3. Left atrial size was severely dilated.   4. Right atrial size was mildly dilated.   5. The mitral valve is normal in structure. Mild mitral valve  regurgitation. No evidence of mitral stenosis.   6. The aortic valve is normal in structure. Aortic valve regurgitation is  not visualized. No aortic stenosis is present.   7. The inferior vena cava is normal in size with greater than 50%  respiratory variability, suggesting right atrial pressure of 3 mmHg.    03/15/2018: TTE IMPRESSIONS   1. The left ventricle has mildly reduced systolic function, with an  ejection fraction of 45-50%. The cavity size was moderately dilated. There  is mild concentric left ventricular hypertrophy. Left ventricular  diastolic Doppler parameters are consistent  with impaired relaxation. Indeterminate filling pressures.   2. Mild hypokinesis of the inferolateral myocardium.   3. The right ventricle has normal systolic function. The cavity was  normal. There is no increase in right ventricular wall thickness.     Laboratory Data:  High Sensitivity Troponin:   Recent Labs  Lab 06/14/20 2202 06/15/20 0118  TROPONINIHS 65* 80*     Chemistry Recent Labs  Lab 06/21/20 0044 06/22/20 0623 06/23/20 7628  NA 129* 129* 127*  K 4.4 3.1* 3.9  CL 91* 90* 91*  CO2 29 28 28   GLUCOSE 224* 249* 263*  BUN 36* 23 21  CREATININE 1.17 1.03 1.03  CALCIUM 7.9* 7.6* 7.4*  GFRNONAA >60 >60 >60  ANIONGAP 9 11 8     Recent Labs  Lab 06/21/20 0044 06/22/20 0312 06/23/20 0204  PROT 5.5* 4.9* 5.4*  ALBUMIN 1.9* 1.7* 1.8*  AST 60* 46* 33  ALT 320* 200* 160*  ALKPHOS 132* 129* 132*  BILITOT 0.8 0.8 0.9   Hematology Recent Labs  Lab  06/21/20 0044 06/22/20 0312 06/23/20 0204  WBC 14.1* 11.9* 12.4*  RBC 3.75* 3.56* 3.83*  HGB 8.8* 8.6* 9.1*  HCT 28.7* 27.4* 29.5*  MCV 76.5* 77.0* 77.0*  MCH 23.5* 24.2* 23.8*  MCHC 30.7 31.4 30.8  RDW 19.7* 19.9* 20.0*  PLT 282 221 230   BNPNo results for input(s): BNP, PROBNP in the last 168 hours.  DDimer No results for input(s): DDIMER in the last 168 hours.   Radiology/Studies:  DG CHEST PORT 1 VIEW  Result Date: 06/19/2020 CLINICAL DATA:  Recent PICC line placement EXAM: PORTABLE CHEST 1 VIEW COMPARISON:  06/18/2020 FINDINGS: Cardiac shadow is enlarged. Left-sided PICC line is noted with the catheter tip in the distal superior vena cava. Right jugular line has been removed in the interval. Lungs are clear. No bony abnormality is noted. IMPRESSION: New left-sided PICC line as described. Electronically Signed   By: Inez Catalina M.D.   On: 06/19/2020 16:03   [place] EKG SITE RITE  Result Date: 06/19/2020 If Site Rite image not attached, placement could not be confirmed due to current cardiac rhythm.    Assessment and Plan:   AFib (persistent) CHA2DS2Vasc is 6, on Eliquis > Heparin gtt > eliquis PO amiodarone ouit patient fairly new >> Gtt here DCCV 06/20/20 lasted <24 hours  Remains on milrinone Continue amiodarone gtt while on inotropes, agree with plans to retry DCCV later in the week once of milrinone  Would prefer to try and avoid pace ablate strategy so early in his AFib history if able Also given bacteremia and foot wounds would need to wait   I do not finds an ID note though HF note reports ID recommended treat 5 days Wound care note on 6/14:  full thickness diabetic foot ulcers to left foot plantar surface of the left 4th toe appears to have exposed ligament or tendon left medial midfoot, plantar surface has a full thickness punched out wound with a pink wound bed dorsum of the left 3rd toe has a shallow, dried wound  Dr. Quentin Ore will see later today    Risk  Assessment/Risk Scores:  { For questions or updates, please contact [facility] Please consult [email or web address] for contact info under    Signed, Baldwin [place], PA-C  06/23/2020 10:38 AM

## 2020-06-24 DIAGNOSIS — R531 Weakness: Secondary | ICD-10-CM | POA: Diagnosis not present

## 2020-06-24 DIAGNOSIS — N179 Acute kidney failure, unspecified: Secondary | ICD-10-CM | POA: Diagnosis not present

## 2020-06-24 DIAGNOSIS — R570 Cardiogenic shock: Secondary | ICD-10-CM | POA: Diagnosis not present

## 2020-06-24 DIAGNOSIS — I509 Heart failure, unspecified: Secondary | ICD-10-CM | POA: Diagnosis not present

## 2020-06-24 DIAGNOSIS — R0602 Shortness of breath: Secondary | ICD-10-CM | POA: Diagnosis not present

## 2020-06-24 LAB — COMPREHENSIVE METABOLIC PANEL
ALT: 116 U/L — ABNORMAL HIGH (ref 0–44)
AST: 29 U/L (ref 15–41)
Albumin: 1.9 g/dL — ABNORMAL LOW (ref 3.5–5.0)
Alkaline Phosphatase: 124 U/L (ref 38–126)
Anion gap: 15 (ref 5–15)
BUN: 19 mg/dL (ref 8–23)
CO2: 25 mmol/L (ref 22–32)
Calcium: 7.6 mg/dL — ABNORMAL LOW (ref 8.9–10.3)
Chloride: 88 mmol/L — ABNORMAL LOW (ref 98–111)
Creatinine, Ser: 0.99 mg/dL (ref 0.61–1.24)
GFR, Estimated: >60 mL/min (ref >60)
Glucose, Bld: 233 mg/dL — ABNORMAL HIGH (ref 70–99)
Potassium: 4.1 mmol/L (ref 3.5–5.1)
Sodium: 128 mmol/L — ABNORMAL LOW (ref 135–145)
Total Bilirubin: 0.9 mg/dL (ref 0.3–1.2)
Total Protein: 5.4 g/dL — ABNORMAL LOW (ref 6.5–8.1)

## 2020-06-24 LAB — COOXEMETRY PANEL
Carboxyhemoglobin: 1 % (ref 0.5–1.5)
Carboxyhemoglobin: 1 % (ref 0.5–1.5)
Carboxyhemoglobin: 1.1 % (ref 0.5–1.5)
Methemoglobin: 1 % (ref 0.0–1.5)
Methemoglobin: 0.9 % (ref 0.0–1.5)
Methemoglobin: 0.9 % (ref 0.0–1.5)
O2 Saturation: 45.6 %
O2 Saturation: 34.2 %
O2 Saturation: 45.1 %
Total hemoglobin: 9.7 g/dL — ABNORMAL LOW (ref 12.0–16.0)
Total hemoglobin: 9.9 g/dL — ABNORMAL LOW (ref 12.0–16.0)
Total hemoglobin: 9.8 g/dL — ABNORMAL LOW (ref 12.0–16.0)

## 2020-06-24 LAB — GLUCOSE, CAPILLARY
Glucose-Capillary: 245 mg/dL — ABNORMAL HIGH (ref 70–99)
Glucose-Capillary: 127 mg/dL — ABNORMAL HIGH (ref 70–99)
Glucose-Capillary: 147 mg/dL — ABNORMAL HIGH (ref 70–99)
Glucose-Capillary: 166 mg/dL — ABNORMAL HIGH (ref 70–99)
Glucose-Capillary: 155 mg/dL — ABNORMAL HIGH (ref 70–99)

## 2020-06-24 LAB — MAGNESIUM: Magnesium: 1.7 mg/dL (ref 1.7–2.4)

## 2020-06-24 LAB — CBC
HCT: 30.7 % — ABNORMAL LOW (ref 39.0–52.0)
Hemoglobin: 9.1 g/dL — ABNORMAL LOW (ref 13.0–17.0)
MCH: 23.2 pg — ABNORMAL LOW (ref 26.0–34.0)
MCHC: 29.6 g/dL — ABNORMAL LOW (ref 30.0–36.0)
MCV: 78.1 fL — ABNORMAL LOW (ref 80.0–100.0)
Platelets: 259 10*3/uL (ref 150–400)
RBC: 3.93 MIL/uL — ABNORMAL LOW (ref 4.22–5.81)
RDW: 20.2 % — ABNORMAL HIGH (ref 11.5–15.5)
WBC: 13.9 10*3/uL — ABNORMAL HIGH (ref 4.0–10.5)
nRBC: 0.3 % — ABNORMAL HIGH (ref 0.0–0.2)

## 2020-06-24 MED ORDER — MAGNESIUM SULFATE 4 GM/100ML IV SOLN
4.0000 g | Freq: Once | INTRAVENOUS | Status: AC
  Administered 2020-06-24: 4 g via INTRAVENOUS
  Filled 2020-06-24: qty 100

## 2020-06-24 MED ORDER — NOREPINEPHRINE 4 MG/250ML-% IV SOLN
1.0000 ug/min | INTRAVENOUS | Status: DC
  Administered 2020-06-24 – 2020-06-28 (×4): 3 ug/min via INTRAVENOUS
  Administered 2020-06-30: 1 ug/min via INTRAVENOUS
  Filled 2020-06-24 (×7): qty 250

## 2020-06-24 MED ORDER — FUROSEMIDE 10 MG/ML IJ SOLN
40.0000 mg | Freq: Once | INTRAMUSCULAR | Status: AC
  Administered 2020-06-24: 40 mg via INTRAVENOUS
  Filled 2020-06-24: qty 4

## 2020-06-24 MED ORDER — DIGOXIN 125 MCG PO TABS
0.1250 mg | ORAL_TABLET | Freq: Every day | ORAL | Status: DC
  Administered 2020-06-24 – 2020-06-29 (×6): 0.125 mg via ORAL
  Filled 2020-06-24 (×6): qty 1

## 2020-06-24 NOTE — Progress Notes (Signed)
Pt refused bath on night shift and stated he wanted to wait until after breakfast to get bathed.

## 2020-06-24 NOTE — Progress Notes (Signed)
RT in to place patient on CPAP, however, patient already currently with CPAP on. Patient resting comfortably.

## 2020-06-24 NOTE — Progress Notes (Addendum)
Advanced Heart Failure Rounding Note  PCP-Cardiologist: Sinclair Grooms, MD   Subjective:   Cardiogenic shock. New onset AF.   6/12 CO-OX 43% --->Started on milrinone 0.25 mcg.  6/13 Milrinone increased to 0.375. Co-ox 58% today.  6/15 Back in Afib w/ RVR, amio rebolused, rate increased to 60  6/15 Fever spike + leukocytosis + hypotension>>started on empiric cefepime + NE. BCx with proteus 6/17 TEE/DC-CV briefly in SR 6/19 Back in AF. Milrinone cut back to 0.25 mcg.  6/20 Developed petechiae R fingers.    Remains on milrinone 0.25 mcg + amio 60 mg per hour. CO-OX 46% .   Denies SOB.     Objective:   Weight Range: 100.6 kg Body mass index is 33.72 kg/m.   Vital Signs:   Temp:  [96 F (35.6 C)-98.5 F (36.9 C)] 96.5 F (35.8 C) (06/21 0339) Pulse Rate:  [50-157] 122 (06/21 0700) Resp:  [14-37] 15 (06/21 0700) BP: (77-122)/(48-97) 93/60 (06/21 0700) SpO2:  [79 %-100 %] 100 % (06/21 0700) Weight:  [100.6 kg] 100.6 kg (06/21 0500) Last BM Date: 06/23/20  Weight change: Filed Weights   06/22/20 0321 06/23/20 0209 06/24/20 0500  Weight: 93.3 kg 93 kg 100.6 kg    Intake/Output:   Intake/Output Summary (Last 24 hours) at 06/24/2020 0800 Last data filed at 06/24/2020 0700 Gross per 24 hour  Intake 1641.85 ml  Output 1750 ml  Net -108.15 ml    CVP 9-10   Physical Exam  General:  In bed.  No resp difficulty HEENT: normal Neck: supple. JVP 9-10.  Carotids 2+ bilat; no bruits. No lymphadenopathy or thryomegaly appreciated. Cor: PMI nondisplaced. Irregular rate & rhythm. No rubs, gallops or murmurs. Lungs: clear Abdomen: soft, nontender, nondistended. No hepatosplenomegaly. No bruits or masses. Good bowel sounds. Extremities: no cyanosis, clubbing, rash, R and LLE ted hose. LUE PICC  Neuro: alert & orientedx3, cranial nerves grossly intact. moves all 4 extremities w/o difficulty. Affect pleasant Skin: R and L fingers with petechiae. Non tender.  .     Telemetry   A Fib 90-100s  Labs    CBC Recent Labs    06/23/20 0204 06/24/20 0436  WBC 12.4* 13.9*  HGB 9.1* 9.1*  HCT 29.5* 30.7*  MCV 77.0* 78.1*  PLT 230 166   Basic Metabolic Panel Recent Labs    06/23/20 0204 06/24/20 0436  NA 127* 128*  K 3.9 4.1  CL 91* 88*  CO2 28 25  GLUCOSE 263* 233*  BUN 21 19  CREATININE 1.03 0.99  CALCIUM 7.4* 7.6*  MG 1.8 1.7   Liver Function Tests Recent Labs    06/23/20 0204 06/24/20 0436  AST 33 29  ALT 160* 116*  ALKPHOS 132* 124  BILITOT 0.9 0.9  PROT 5.4* 5.4*  ALBUMIN 1.8* 1.9*   No results for input(s): LIPASE, AMYLASE in the last 72 hours. Cardiac Enzymes No results for input(s): CKTOTAL, CKMB, CKMBINDEX, TROPONINI in the last 72 hours.  BNP: BNP (last 3 results) Recent Labs    05/26/20 1429 06/15/20 0729  BNP 1,609.8* 2,424.4*    ProBNP (last 3 results) Recent Labs    07/11/19 1107 04/23/20 1446  PROBNP 445 2,488*     D-Dimer No results for input(s): DDIMER in the last 72 hours. Hemoglobin A1C No results for input(s): HGBA1C in the last 72 hours. Fasting Lipid Panel No results for input(s): CHOL, HDL, LDLCALC, TRIG, CHOLHDL, LDLDIRECT in the last 72 hours. Thyroid Function Tests No results  for input(s): TSH, T4TOTAL, T3FREE, THYROIDAB in the last 72 hours.  Invalid input(s): FREET3   Other results:   Imaging    No results found.   Medications:     Scheduled Medications:  apixaban  5 mg Oral BID   atorvastatin  40 mg Oral Daily   Chlorhexidine Gluconate Cloth  6 each Topical Q0600   docusate sodium  200 mg Oral BID   furosemide  40 mg Oral Daily   insulin aspart  0-15 Units Subcutaneous TID WC   insulin aspart  0-5 Units Subcutaneous QHS   insulin glargine  10 Units Subcutaneous QHS   mupirocin ointment   Topical Q12H   potassium chloride SA  40 mEq Oral Daily   senna  2 tablet Oral Daily   sodium chloride flush  10-40 mL Intracatheter Q12H   tamsulosin  0.4 mg Oral QPC  supper   vitamin B-12  1,000 mcg Oral QHS    Infusions:  sodium chloride Stopped (06/23/20 0817)   amiodarone 60 mg/hr (06/24/20 0748)   cefTRIAXone (ROCEPHIN)  IV Stopped (06/23/20 2022)   milrinone 0.25 mcg/kg/min (06/24/20 0700)    PRN Medications:   Assessment/Plan    Acute systolic HF -> 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 Lactate 9.9  - doubt ischemic as hstrop low despite profound hemodynamic support but given h/p CAD and multiple CRFs will need ischemic eval once more stable and renal function improves - Off NE. On milrinone 0.25 . Co-ox 46%  -  Add digoxin.  -CVP 10. Give 40 mg IV lasix x1. Stop po lasix.   - Off GDMT due to need for inotropes  2. CAD - s/p previous LAD stenting - doubt ischemia is major factor in HF given low hsTrop - continue ASA - Continue lipitor 40 mg daily.  - No chest pain.    3. AF with RVR - Eliquis stopped 6/12 and switched to heparin.  - TEE/DCCV on 6/17. Back in AF overnight - Continue IV amio while on inotropes - Continue Eliquis  - Remains in A fib.  - Plan for repeat DC-CV tomorrow.  - EP reconsulted. Not currently a candidate for ablation or CRT-P with wounds.   4.  AKI - due to ATN and shock - peak creatine 2.9 - Resolved.   5. Shock liver - resolved   6. DM2 - SSI    7. Hypokalemia  -Stable.   8. Sepsis/GN Bacteremia  - PCT 2.7. CXR w/o infiltrate. Foley replaced . BCx Proteus Species.  - WBC 13.9  - Abx narrowed from cefepime to ceftriaxone per ID. Day 7.  - sepsis resolved  9. Urinary retention - failed Foley removal - continue Flomax   10. Wound on left foot - deep but clean based - WC recs bid dressing changes   11. Hyponatremia - Na 124-> 129 ->127 ->128  - 6/18  tolvaptan, mentating ok  - fluid restrict  12.  Petechial rash on R hand/L hand  - ? Cholesterol emboli. - neurovascular intact. No wound - follow   13. Code status - Full Code  PT following.  Move to [facility] to resume once out of the ICU.    Length of Stay: [place], NP  06/24/2020, 8:00 AM  Advanced Heart Failure Team Pager [phone] (M-F;  7a - 5p)  Please contact [facility] for night-coverage after hours (5p -7a ) and weekends on [email or web address]  Patient seen and examined with the above-signed Advanced Practice Provider and/or Housestaff. I personally reviewed laboratory data, imaging studies and relevant notes. I independently examined the patient and formulated the important aspects of the plan. I have edited the note to reflect any of my changes or salient points. I have personally discussed the plan with the patient and/or family.  Remains on milrinone 0.25 and IV amio. Still in AF. Co-ox down to 46%. Feels weak. Denies SOB, orthopnea or PND.   Has petechiae on both hands.   CVP 10  General:  Weak appearing. No resp difficulty HEENT: normal Neck: supple. JVP to jaw Carotids 2+ bilat; no bruits. No lymphadenopathy or thryomegaly appreciated. Cor: PMI nondisplaced. Irregular rate & rhythm. No rubs, gallops or murmurs. Lungs: clear Abdomen: soft, nontender, nondistended. No hepatosplenomegaly. No bruits or masses. Good bowel sounds. Extremities: no cyanosis, clubbing, rash, 1+ edema +petechiae on hands Neuro: alert & orientedx3, cranial nerves grossly intact. moves all 4 extremities w/o difficulty. Affect pleasant  Co-ox back down despite milrinone. Will repeat co-ox if > 50% can go to 2C. If not would hold in ICU. Agree with digoxin and lasix.   D/w EP. No candidate for AVN ablation and pacing due to risk of infection.   CRITICAL CARE Performed by: Glori Bickers  Total critical care time: 35 minutes  Critical care time was exclusive of separately billable procedures and treating other  patients.  Critical care was necessary to treat or prevent imminent or life-threatening deterioration.  Critical care was time spent personally by me (independent of midlevel providers or residents) on the following activities: development of treatment plan with patient and/or surrogate as well as nursing, discussions with consultants, evaluation of patient's response to treatment, examination of patient, obtaining history from patient or surrogate, ordering and performing treatments and interventions, ordering and review of laboratory studies, ordering and review of radiographic studies, pulse oximetry and re-evaluation of patient's condition.  Glori Bickers, MD  11:00 AM

## 2020-06-24 NOTE — Progress Notes (Addendum)
Progress Note  Patient Name: ABSHIR PAOLINI Date of Encounter: 06/24/2020  [facility] Cardiologist: Sinclair Grooms, MD   Subjective   Wants to get OOB, no CP, o rest SOB  Inpatient Medications    Scheduled Meds:  apixaban  5 mg Oral BID   atorvastatin  40 mg Oral Daily   Chlorhexidine Gluconate Cloth  6 each Topical Q0600   docusate sodium  200 mg Oral BID   furosemide  40 mg Oral Daily   insulin aspart  0-15 Units Subcutaneous TID WC   insulin aspart  0-5 Units Subcutaneous QHS   insulin glargine  10 Units Subcutaneous QHS   mupirocin ointment   Topical Q12H   potassium chloride SA  40 mEq Oral Daily   senna  2 tablet Oral Daily   sodium chloride flush  10-40 mL Intracatheter Q12H   tamsulosin  0.4 mg Oral QPC supper   vitamin B-12  1,000 mcg Oral QHS   Continuous Infusions:  sodium chloride Stopped (06/23/20 0817)   amiodarone 60 mg/hr (06/24/20 0748)   cefTRIAXone (ROCEPHIN)  IV Stopped (06/23/20 2022)   milrinone 0.25 mcg/kg/min (06/24/20 0700)   PRN Meds: acetaminophen, ondansetron (ZOFRAN) IV, sodium chloride flush   Vital Signs    Vitals:   06/24/20 0615 06/24/20 0630 06/24/20 0645 06/24/20 0700  BP:  101/69  93/60  Pulse: 98 97 (!) 110 (!) 122  Resp: (!) 22 (!) 23 (!) 24 15  Temp:      TempSrc:      SpO2: 98% 100% 99% 100%  Weight:      Height:        Intake/Output Summary (Last 24 hours) at 06/24/2020 0820 Last data filed at 06/24/2020 0700 Gross per 24 hour  Intake 1641.85 ml  Output 1750 ml  Net -108.15 ml   Last 3 Weights 06/24/2020 06/23/2020 06/22/2020  Weight (lbs) 221 lb 12.5 oz 205 lb 0.4 oz 205 lb 11 oz  Weight (kg) 100.6 kg 93 kg 93.3 kg      Telemetry    AFib generally low 100's - Personally Reviewed  ECG    No new EKGs - Personally Reviewed  Physical Exam   GEN: No acute distress.   Neck: No JVD Cardiac: irreg-irreg, no murmurs, rubs, or gallops.  Respiratory: CTA b/l. GI: Soft, nontender, non-distended  MS: trace  edema; No deformity. Neuro:  Nonfocal  Psych: Normal affect   Labs    High Sensitivity Troponin:   Recent Labs  Lab 06/14/20 2202 06/15/20 0118  TROPONINIHS 65* 80*      Chemistry Recent Labs  Lab 06/22/20 0312 06/23/20 0204 06/24/20 0436  NA 129* 127* 128*  K 3.1* 3.9 4.1  CL 90* 91* 88*  CO2 28 28 25   GLUCOSE 249* 263* 233*  BUN 23 21 19   CREATININE 1.03 1.03 0.99  CALCIUM 7.6* 7.4* 7.6*  PROT 4.9* 5.4* 5.4*  ALBUMIN 1.7* 1.8* 1.9*  AST 46* 33 29  ALT 200* 160* 116*  ALKPHOS 129* 132* 124  BILITOT 0.8 0.9 0.9  GFRNONAA >60 >60 >60  ANIONGAP 11 8 15      Hematology Recent Labs  Lab 06/22/20 0312 06/23/20 0204 06/24/20 0436  WBC 11.9* 12.4* 13.9*  RBC 3.56* 3.83* 3.93*  HGB 8.6* 9.1* 9.1*  HCT 27.4* 29.5* 30.7*  MCV 77.0* 77.0* 78.1*  MCH 24.2* 23.8* 23.2*  MCHC 31.4 30.8 29.6*  RDW 19.9* 20.0* 20.2*  PLT 221 230 259  BNPNo results for input(s): BNP, PROBNP in the last 168 hours.   DDimer No results for input(s): DDIMER in the last 168 hours.   Radiology    No results found.  Cardiac Studies   06/20/20: TEE/DCCV LEFT VENTRICLE: EF = 20-25% RIGHT VENTRICLE: Moderately HK LEFT ATRIUM: Severely dilated 5.0 cm LEFT ATRIAL APPENDAGE: No clot RIGHT ATRIUM: Moderately dilated AORTIC VALVE:  Trileaflet. Mildly calcified No AI/AS MITRAL VALVE:    Normal severe posterior MR TRICUSPID VALVE: Moderate TR PULMONIC VALVE: Trivial PR INTERATRIAL SEPTUM: No ASD/PFO PERICARDIUM: No effusion DESCENDING AORTA: Moderate to severe plaque >> DCCV     06/15/20: TTE IMPRESSIONS   1. Left ventricular ejection fraction, by estimation, is <20%. The left  ventricle has severely decreased function. The left ventricle demonstrates  global hypokinesis. The left ventricular internal cavity size was severely  dilated. Left ventricular  diastolic function could not be evaluated.   2. Right ventricular systolic function is severely reduced. The right  ventricular  size is moderately enlarged. There is moderately elevated  pulmonary artery systolic pressure. The estimated right ventricular  systolic pressure is 99.2 mmHg.   3. Right atrial size was severely dilated.   4. The mitral valve is degenerative. Moderate mitral valve regurgitation.  No evidence of mitral stenosis.   5. The aortic valve is tricuspid. Aortic valve regurgitation is not  visualized. Mild aortic valve sclerosis is present, with no evidence of  aortic valve stenosis.   6. The inferior vena cava is dilated in size with <50% respiratory  variability, suggesting right atrial pressure of 15 mmHg.     05/05/20; TTE IMPRESSIONS   1. Left ventricular ejection fraction, by estimation, is 20 to 25%. The  left ventricle has severely decreased function. The left ventricle  demonstrates global hypokinesis. The left ventricular internal cavity size  was moderately dilated. Left  ventricular diastolic parameters are indeterminate. Elevated left  ventricular end-diastolic pressure.   2. Right ventricular systolic function is moderately reduced. The right  ventricular size is normal. There is mildly elevated pulmonary artery  systolic pressure.   3. Left atrial size was severely dilated.   4. Right atrial size was mildly dilated.   5. The mitral valve is normal in structure. Mild mitral valve  regurgitation. No evidence of mitral stenosis.   6. The aortic valve is normal in structure. Aortic valve regurgitation is  not visualized. No aortic stenosis is present.   7. The inferior vena cava is normal in size with greater than 50%  respiratory variability, suggesting right atrial pressure of 3 mmHg.     03/15/2018: TTE IMPRESSIONS   1. The left ventricle has mildly reduced systolic function, with an  ejection fraction of 45-50%. The cavity size was moderately dilated. There  is mild concentric left ventricular hypertrophy. Left ventricular  diastolic Doppler parameters are consistent  with  impaired relaxation. Indeterminate filling pressures.   2. Mild hypokinesis of the inferolateral myocardium.   3. The right ventricle has normal systolic function. The cavity was  normal. There is no increase in right ventricular wall thickness.  Patient Profile     80 y.o. male with a hx of HTN, HLD, OSA, CAD (stent to LAD and RCA in 1998 and 1999; patent coronary arteries 4268), new CHF(systolic), AFib, DM  Admitted with CHF exacerbation, cardiogenic shock, sepsis, AFib w/RVR  Assessment & Plan    AFib (persistent) CHA2DS2Vasc is 6, on Eliquis > Heparin gtt > eliquis PO amiodarone ouit patient fairly new >>  Gtt here DCCV 06/20/20 lasted <24 hours  Continue amiodarone Rates stable low 100's   Dr. Curt Bears has seen and examined the patient Recommend re-try DCCV once volume status is euvolemic, off inotrope and close to discharge No plans for pacing given below    Sepsis is better UTI Proteus bacteremia I do not finds an ID note though HF note reports ID recommended treat 7 days (lloks like today is day 5-6 Wound care note on 6/14:  full thickness diabetic foot ulcers to left foot plantar surface of the left 4th toe appears to have exposed ligament or tendon left medial midfoot, plantar surface has a full thickness punched out wound with a pink wound bed dorsum of the left 3rd toe has a shallow, dried wound  For questions or updates, please contact [facility] Please consult [email or web address] for contact info under        Signed, Baldwin Jamaica, PA-C  06/24/2020, 8:20 AM    I have seen and examined this patient with Tommye Standard.  Agree with above, note added to reflect my findings.  On exam, tachycardic, irregular, no murmurs.  Patient with continued atrial fibrillation.  He is currently on.  He has net out 8 L.  Fortunately his heart rates are somewhat well controlled in the 90s to low 100s.  I do think that he would benefit from another chance in sinus rhythm.  Would  work to get him off of the milrinone and get his heart failure well compensated and possibly on heart failure medications prior to repeat cardioversion.  Unfortunately, he has wounds on his feet as well as Proteus bacteremia and thus would not be a candidate for invasive EP procedures at this time.  Christoffer Currier M. Kasey Ewings MD 06/24/2020 8:26 AM

## 2020-06-24 NOTE — Progress Notes (Addendum)
Occupational Therapy Treatment Patient Details Name: Wesley Winters MRN: [id number] DOB: 1940/02/24 Today's Date: 06/24/2020    History of present illness 80 yo admitted 6/11 after 2 falls at home with fatigue and weakness with ARF. PMHx; HF, cardiomyopathy with EF 25%, Afib, CAD, DM, HTN, recent admit 5/23-5/31   OT comments  Patient seated in recliner and agreeable to OT session. Declined functional mobility to sink due to fatigue from PT session, but assisted to sink via recliner and pt stood at sink with min assist. Completed grooming with min guard with limited tolerance in standing and requires increased rest breaks.  Reviewed exercises to BUEs due to edema/weakness, noted decreased FM coordination (L worse than R).  Continue to recommend SNF at this time, will follow acutely.    Follow Up Recommendations  SNF;Supervision/Assistance - 24 hour    Equipment Recommendations  3 in 1 bedside commode;Other (comment) (RW)    Recommendations for Other Services      Precautions / Restrictions Precautions Precautions: Fall Precaution Comments: stool incontinence Restrictions Weight Bearing Restrictions: No       Mobility Bed Mobility Overal bed mobility: Needs Assistance Bed Mobility: Supine to Sit     Supine to sit: Min assist     General bed mobility comments: OOB in recliner    Transfers Overall transfer level: Needs assistance Equipment used: Rolling walker (2 wheeled) Transfers: Sit to/from Stand Sit to Stand: Min assist         General transfer comment: min assist for sit to stand at sink    Balance Overall balance assessment: Needs assistance Sitting-balance support: No upper extremity supported;Feet supported Sitting balance-Leahy Scale: Fair     Standing balance support: Single extremity supported;During functional activity Standing balance-Leahy Scale: Fair Standing balance comment: relies on at least 1 UE in standing during grooming tasks at sink                            ADL either performed or assessed with clinical judgement   ADL Overall ADL's : Needs assistance/impaired     Grooming: Wash/dry face;Oral care;Min guard;Brushing hair;Sitting;Standing Grooming Details (indicate cue type and reason): min guard at sink, limited tolerance and requires rest breaks and completing tasks in sitting                   Toilet Transfer Details (indicate cue type and reason): pt declined transfer, just finsihed ambulation with PT         Functional mobility during ADLs: Min guard General ADL Comments: pt declined transfers/mobility in room as just finsihed with PT, agreeable to grooming standing at sink but fatigues easily     Vision       Perception     Praxis      Cognition Arousal/Alertness: Awake/alert Behavior During Therapy: WFL for tasks assessed/performed;Anxious Overall Cognitive Status: Impaired/Different from baseline Area of Impairment: Problem solving                             Problem Solving: Slow processing;Requires verbal cues General Comments: pt with decreased problem sovling, requires verbal cueing        Exercises Exercises: Other exercises Other Exercises Other Exercises: Completed 10 reps 1 setp of overhead press, hand flexion/extension; 5 reps 1 set of tip to tip   Shoulder Instructions       General Comments VSS on RA  Pertinent Vitals/ Pain       Pain Assessment: No/denies pain  Home Living                                          Prior Functioning/Environment              Frequency  Min 2X/week        Progress Toward Goals  OT Goals(current goals can now be found in the care plan section)  Progress towards OT goals: Progressing toward goals  Acute Rehab OT Goals Patient Stated Goal: return home OT Goal Formulation: With patient  Plan Discharge plan remains appropriate;Frequency remains appropriate     Co-evaluation                 AM-PAC OT "6 Clicks" Daily Activity     Outcome Measure   Help from another person eating meals?: A Little Help from another person taking care of personal grooming?: A Little Help from another person toileting, which includes using toliet, bedpan, or urinal?: A Lot Help from another person bathing (including washing, rinsing, drying)?: A Lot Help from another person to put on and taking off regular upper body clothing?: A Little Help from another person to put on and taking off regular lower body clothing?: A Lot 6 Click Score: 15    End of Session    OT Visit Diagnosis: Unsteadiness on feet (R26.81);Other abnormalities of gait and mobility (R26.89);Muscle weakness (generalized) (M62.81)   Activity Tolerance Patient limited by fatigue   Patient Left in chair;with call bell/phone within reach;with chair alarm set   Nurse Communication Mobility status        Time: 1038-1100 OT Time Calculation (min): 22 min  Charges: OT General Charges $OT Visit: 1 Visit OT Treatments $Self Care/Home Management : 8-22 mins  Wesley Winters, OT [place] Pager [phone] Office [phone]    Wesley Winters 06/24/2020, 12:53 PM

## 2020-06-24 NOTE — Progress Notes (Signed)
Patient ID: Wesley Winters, male   DOB: 10-12-1940, 80 y.o.   MRN: [id number]    Progress Note from the Palliative Medicine Team at [facility]   Patient Name: Wesley Winters        Date: 06/24/2020 DOB: [birth year]  Age: 80 y.o. MRN#: [id number] Attending Physician: Jolaine Artist, MD Primary Care Physician: [name], MD Admit Date: 06/14/2020   Medical records reviewed   80 y.o. male   admitted on 06/14/2020 with history of cardiomyopathy with EF of 25% likely tachycardia related with history of A. fib, CAD status post stenting, diabetes mellitus was recently admitted for CHF and also underwent cardioversion discharged on Jun 03, 2020 about 10 days ago has been having poor appetite and weakness, and three reported falls per his wife.   Complaining of the weakness, patient was brought to the ER.  Patient reports poor appetite and nausea,  denies any abdominal pain.     In the ER patient was found to have intermittent episodes of A. fib with RVR labs are significant for creatinine worsening from 1.2 about 4 days ago and it is around 2.6.  LFTs are elevated at AST of 110 ALT of 87 total bilirubin 1.2.  CBC is around baseline.  Patient was given to 50 cc normal saline bolus given that patient had acute renal failure.  Abdomen appears benign.  COVID test was negative.  Patient underwent CT head and CT chest abdomen pelvis which does not show any acute.  Day 9 of this hospitalization.   Currently on Milrinone, Amiodarone, Heparin   He reports weakness.  Ongoing issues with A-fib/ TEE/DCCV on 6/17.   Not medically stable for discharge  Patient and family face treatment option decisions, advanced directive decisions and anticipatory care needs.   This NP visited patient at the bedside as a follow up for palliative medicine needs and emotional support. Patient is alert and oriented, his daughter and wife are at the bedside  Patient is alert and oriented. He remains weak, working with therapies. He  is hopeful for improvement and open to all offered and available medcal interventions to prolong life.       Education offered on the importance of  continued conversation with family and their  medical providers regarding overall plan of care and treatment options,  ensuring decisions are within the context of the patients values and GOCs.    Encouraged patient/family to consider DNR/DNI status understanding evidenced based poor outcomes in similar hospitalized patient, as the cause of arrest is likely associated with advanced chronic illness rather than an easily reversible acute cardio-pulmonary event.      Continue education offered regarding current medical situation.  Family verbalize concern for "what the next steps are" in the treatment plan.    Patient will need to be medically stabilized prior to discharge.  [name] is open to short-term rehab at [facility] on discharge.  Education offered on outpatient community-based palliative care services  Questions and concerns addressed   PMT will continue to support holistically   Total time spent on the unit was 35 minutes  This nurse practitioner informed  the patient/family that I will be out of the hospital until Monday morning.  If the patient is still hospitalized I will follow-up at that time.   PMT will continue to support and f/u as needed.   Call palliative medicine team phone # [phone] with questions or concerns in the interim.    Greater than 50% of  the time was spent in counseling and coordination of care  [name] NP  Palliative Medicine Team Team Phone # [phone] Pager [phone]

## 2020-06-24 NOTE — Progress Notes (Signed)
Physical Therapy Treatment Patient Details Name: Wesley Winters MRN: [id number] DOB: 10-11-1940 Today's Date: 06/24/2020    History of Present Illness 80 yo admitted 6/11 after 2 falls at home with fatigue and weakness with ARF. PMHx; HF, cardiomyopathy with EF 25%, Afib, CAD, DM, HTN, recent admit 5/23-5/31    PT Comments    Pt making steady progress with mobility. Pt with very little confidence in his mobility and needs reassurance he can mobilize with assistance. Continue to recommend SNF for further rehab.   Follow Up Recommendations  SNF;Supervision for mobility/OOB     Equipment Recommendations  Rolling walker with 5" wheels;3in1 (PT)    Recommendations for Other Services       Precautions / Restrictions Precautions Precautions: Fall Precaution Comments: stool incontinence    Mobility  Bed Mobility Overal bed mobility: Needs Assistance Bed Mobility: Supine to Sit     Supine to sit: Min assist     General bed mobility comments: Assist to elevate trunk into sitting    Transfers Overall transfer level: Needs assistance Equipment used: Rolling walker (2 wheeled) Transfers: Sit to/from Stand Sit to Stand: Mod assist;Min assist         General transfer comment: Assist to bring hips up and verbal cues for hand placement. Min assist to rise from bed and mod assist to rise from recliner.  Ambulation/Gait Ambulation/Gait assistance: Min assist;+2 safety/equipment Gait Distance (Feet): 40 Feet (x 2) Assistive device: Rolling walker (2 wheeled) Gait Pattern/deviations: Step-through pattern;Decreased stride length;Trunk flexed Gait velocity: decr Gait velocity interpretation: <1.8 ft/sec, indicate of risk for recurrent falls General Gait Details: Assist for balance and support. Close chair follow to encourage incr distance   Stairs             Wheelchair Mobility    Modified Rankin (Stroke Patients Only)       Balance Overall balance assessment:  Needs assistance Sitting-balance support: Feet supported;No upper extremity supported Sitting balance-Leahy Scale: Fair     Standing balance support: Bilateral upper extremity supported Standing balance-Leahy Scale: Fair Standing balance comment: walker and min guard for static sitting                            Cognition Arousal/Alertness: Awake/alert Behavior During Therapy: WFL for tasks assessed/performed;Anxious Overall Cognitive Status: Within Functional Limits for tasks assessed                                 General Comments: Pt with lack of confidence in ability to mobilize. Needs verbal encouragement that he can perform task      Exercises      General Comments General comments (skin integrity, edema, etc.): VSS on RA      Pertinent Vitals/Pain Pain Assessment: No/denies pain    Home Living                      Prior Function            PT Goals (current goals can now be found in the care plan section) Acute Rehab PT Goals Patient Stated Goal: return home Progress towards PT goals: Progressing toward goals    Frequency    Min 3X/week      PT Plan Current plan remains appropriate    Co-evaluation              AM-PAC PT "  6 Clicks" Mobility   Outcome Measure  Help needed turning from your back to your side while in a flat bed without using bedrails?: A Little Help needed moving from lying on your back to sitting on the side of a flat bed without using bedrails?: A Little Help needed moving to and from a bed to a chair (including a wheelchair)?: A Little Help needed standing up from a chair using your arms (e.g., wheelchair or bedside chair)?: A Lot Help needed to walk in hospital room?: A Lot Help needed climbing 3-5 steps with a railing? : Total 6 Click Score: 14    End of Session Equipment Utilized During Treatment: Gait belt Activity Tolerance: Patient tolerated treatment well Patient left: in  chair;with call bell/phone within reach;with chair alarm set Nurse Communication: Mobility status PT Visit Diagnosis: Other abnormalities of gait and mobility (R26.89);Difficulty in walking, not elsewhere classified (R26.2)     Time: 5170-0174 PT Time Calculation (min) (ACUTE ONLY): 20 min  Charges:  $Gait Training: 8-22 mins                     Makena Pager [phone] Office [place] 06/24/2020, 12:01 PM

## 2020-06-25 DIAGNOSIS — L8990 Pressure ulcer of unspecified site, unspecified stage: Secondary | ICD-10-CM | POA: Insufficient documentation

## 2020-06-25 LAB — COOXEMETRY PANEL
Carboxyhemoglobin: 1.3 % (ref 0.5–1.5)
Methemoglobin: 0.7 % (ref 0.0–1.5)
O2 Saturation: 54.3 %
Total hemoglobin: 9.3 g/dL — ABNORMAL LOW (ref 12.0–16.0)

## 2020-06-25 LAB — CBC
HCT: 28.8 % — ABNORMAL LOW (ref 39.0–52.0)
Hemoglobin: 8.7 g/dL — ABNORMAL LOW (ref 13.0–17.0)
MCH: 23.1 pg — ABNORMAL LOW (ref 26.0–34.0)
MCHC: 30.2 g/dL (ref 30.0–36.0)
MCV: 76.4 fL — ABNORMAL LOW (ref 80.0–100.0)
Platelets: 269 10*3/uL (ref 150–400)
RBC: 3.77 MIL/uL — ABNORMAL LOW (ref 4.22–5.81)
RDW: 20.3 % — ABNORMAL HIGH (ref 11.5–15.5)
WBC: 12.9 10*3/uL — ABNORMAL HIGH (ref 4.0–10.5)
nRBC: 0.2 % (ref 0.0–0.2)

## 2020-06-25 LAB — GLUCOSE, CAPILLARY
Glucose-Capillary: 154 mg/dL — ABNORMAL HIGH (ref 70–99)
Glucose-Capillary: 159 mg/dL — ABNORMAL HIGH (ref 70–99)
Glucose-Capillary: 164 mg/dL — ABNORMAL HIGH (ref 70–99)
Glucose-Capillary: 151 mg/dL — ABNORMAL HIGH (ref 70–99)

## 2020-06-25 LAB — MAGNESIUM: Magnesium: 1.9 mg/dL (ref 1.7–2.4)

## 2020-06-25 LAB — BASIC METABOLIC PANEL
Anion gap: 10 (ref 5–15)
BUN: 16 mg/dL (ref 8–23)
CO2: 27 mmol/L (ref 22–32)
Calcium: 7.6 mg/dL — ABNORMAL LOW (ref 8.9–10.3)
Chloride: 86 mmol/L — ABNORMAL LOW (ref 98–111)
Creatinine, Ser: 1.05 mg/dL (ref 0.61–1.24)
GFR, Estimated: >60 mL/min (ref >60)
Glucose, Bld: 299 mg/dL — ABNORMAL HIGH (ref 70–99)
Potassium: 3.9 mmol/L (ref 3.5–5.1)
Sodium: 123 mmol/L — ABNORMAL LOW (ref 135–145)

## 2020-06-25 LAB — SODIUM: Sodium: 125 mmol/L — ABNORMAL LOW (ref 135–145)

## 2020-06-25 MED ORDER — ZINC OXIDE 40 % EX OINT
TOPICAL_OINTMENT | Freq: Every day | CUTANEOUS | Status: DC | PRN
  Filled 2020-06-25 (×3): qty 57

## 2020-06-25 MED ORDER — TOLVAPTAN 15 MG PO TABS
15.0000 mg | ORAL_TABLET | Freq: Once | ORAL | Status: AC
  Administered 2020-06-25: 15 mg via ORAL
  Filled 2020-06-25: qty 1

## 2020-06-25 MED ORDER — MAGNESIUM SULFATE 2 GM/50ML IV SOLN
2.0000 g | Freq: Once | INTRAVENOUS | Status: AC
  Administered 2020-06-25: 2 g via INTRAVENOUS
  Filled 2020-06-25: qty 50

## 2020-06-25 NOTE — Progress Notes (Addendum)
Advanced Heart Failure Rounding Note  PCP-Cardiologist: Sinclair Grooms, MD   Subjective:   Cardiogenic shock. New onset AF.   6/12 CO-OX 43% --->Started on milrinone 0.25 mcg.  6/13 Milrinone increased to 0.375. Co-ox 58% today.  6/15 Back in Afib w/ RVR, amio rebolused, rate increased to 60  6/15 Fever spike + leukocytosis + hypotension>>started on empiric cefepime + NE. BCx with proteus 6/17 TEE/DC-CV briefly in SR 6/19 Back in AF. Milrinone cut back to 0.25 mcg.  6/20 Developed petechiae R fingers.    Co-ox dropped yesterday to  34%. Milrinone increased to 0.375 and NE 3 added. Co-ox improved to 45%. Co-ox pending this am.  Remains in AF despite IV amio.   Feels ok. Denies CP or SOB. SCr stable. CBGs remain elevated. Sodium 123    Objective:   Weight Range: 102.6 kg Body mass index is 34.39 kg/m.   Vital Signs:   Temp:  [97.6 F (36.4 C)-98.2 F (36.8 C)] 97.6 F (36.4 C) (06/22 0000) Pulse Rate:  [53-108] 70 (06/22 0500) Resp:  [14-29] 16 (06/22 0500) BP: (97-133)/(28-84) 117/65 (06/22 0500) SpO2:  [84 %-100 %] 100 % (06/22 0500) Weight:  [102.6 kg] 102.6 kg (06/22 0500) Last BM Date: 06/24/20  Weight change: Filed Weights   06/23/20 0209 06/24/20 0500 06/25/20 0500  Weight: 93 kg 100.6 kg 102.6 kg    Intake/Output:   Intake/Output Summary (Last 24 hours) at 06/25/2020 4128 Last data filed at 06/25/2020 0500 Gross per 24 hour  Intake 1533.06 ml  Output 2040 ml  Net -506.94 ml       Physical Exam   General:  Elderly. No resp difficulty HEENT: normal Neck: supple. no JVD. Carotids 2+ bilat; no bruits. No lymphadenopathy or thryomegaly appreciated. Cor: PMI nondisplaced. Irregular rate & rhythm. No rubs, gallops or murmurs. Lungs: clear Abdomen: soft, nontender, nondistended. No hepatosplenomegaly. No bruits or masses. Good bowel sounds. Extremities: no cyanosis, clubbing, rash, edema  + Foley  Neuro: alert & orientedx3, cranial nerves  grossly intact. moves all 4 extremities w/o difficulty. Affect pleasant   Telemetry    A Fib 90-100s   Labs    CBC Recent Labs    06/24/20 0436 06/25/20 0240  WBC 13.9* 12.9*  HGB 9.1* 8.7*  HCT 30.7* 28.8*  MCV 78.1* 76.4*  PLT 259 786    Basic Metabolic Panel Recent Labs    06/24/20 0436 06/25/20 0240  NA 128* 123*  K 4.1 3.9  CL 88* 86*  CO2 25 27  GLUCOSE 233* 299*  BUN 19 16  CREATININE 0.99 1.05  CALCIUM 7.6* 7.6*  MG 1.7 1.9    Liver Function Tests Recent Labs    06/23/20 0204 06/24/20 0436  AST 33 29  ALT 160* 116*  ALKPHOS 132* 124  BILITOT 0.9 0.9  PROT 5.4* 5.4*  ALBUMIN 1.8* 1.9*    No results for input(s): LIPASE, AMYLASE in the last 72 hours. Cardiac Enzymes No results for input(s): CKTOTAL, CKMB, CKMBINDEX, TROPONINI in the last 72 hours.  BNP: BNP (last 3 results) Recent Labs    05/26/20 1429 06/15/20 0729  BNP 1,609.8* 2,424.4*     ProBNP (last 3 results) Recent Labs    07/11/19 1107 04/23/20 1446  PROBNP 445 2,488*      D-Dimer No results for input(s): DDIMER in the last 72 hours. Hemoglobin A1C No results for input(s): HGBA1C in the last 72 hours. Fasting Lipid Panel No results for input(s): CHOL, HDL,  LDLCALC, TRIG, CHOLHDL, LDLDIRECT in the last 72 hours. Thyroid Function Tests No results for input(s): TSH, T4TOTAL, T3FREE, THYROIDAB in the last 72 hours.  Invalid input(s): FREET3   Other results:   Imaging    No results found.   Medications:     Scheduled Medications:  apixaban  5 mg Oral BID   atorvastatin  40 mg Oral Daily   Chlorhexidine Gluconate Cloth  6 each Topical Q0600   digoxin  0.125 mg Oral Daily   docusate sodium  200 mg Oral BID   insulin aspart  0-15 Units Subcutaneous TID WC   insulin aspart  0-5 Units Subcutaneous QHS   insulin glargine  10 Units Subcutaneous QHS   mupirocin ointment   Topical Q12H   potassium chloride SA  40 mEq Oral Daily   senna  2 tablet Oral Daily    sodium chloride flush  10-40 mL Intracatheter Q12H   tamsulosin  0.4 mg Oral QPC supper   vitamin B-12  1,000 mcg Oral QHS    Infusions:  sodium chloride Stopped (06/23/20 0817)   amiodarone 60 mg/hr (06/25/20 0603)   cefTRIAXone (ROCEPHIN)  IV 2 g (06/24/20 2008)   milrinone 0.375 mcg/kg/min (06/24/20 2259)   norepinephrine (LEVOPHED) Adult infusion 3 mcg/min (06/24/20 1800)    PRN Medications:   Assessment/Plan    Acute systolic HF -> 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 Lactate 9.9  - doubt ischemic as hstrop low despite profound hemodynamic support but given h/p CAD and multiple CRFs will need ischemic eval once more stable and renal function improves - Failing inotrope wean. Milrinone increased to 0.375 and NE 3 added on 6/21. Repeat co-ox this am  - Continue digoxin  - Volume status improved after IV lasix yesterday.  - Off GDMT due to need for inotropes  2. CAD - s/p previous LAD stenting - doubt ischemia is major factor in HF given low hsTrop - continue ASA - Continue lipitor 40 mg daily.  - No s/s ischemia   3. AF with RVR - Eliquis stopped 6/12 and switched to heparin.  - TEE/DCCV on 6/17. Back in AF overnight - Continue IV amio while on inotropes - Continue Eliquis  - Remains in A fib.  - Plan for repeat DC-CV later this week  - EP reconsulted. Not currently a candidate for ablation or CRT-P with wounds.   4.  AKI - due to ATN and shock - peak creatine 2.9 - Resolved.   5. Shock liver - resolved   6. DM2 - SSI    7. Hypokalemia  -Stable.   8. Sepsis/GN Bacteremia  - PCT 2.7. CXR w/o infiltrate. Foley replaced . BCx Proteus Species.  - Abx narrowed from cefepime to ceftriaxone per ID. Has completed 7 days - sepsis resolved  9. Urinary retention - failed Foley  removal - continue Flomax  - will need repeat voiding trial soon   10. Wound on left foot - deep but clean based - WC recs bid dressing changes   11. Hyponatremia - Na 124-> 129 ->127 ->128 -> 123 - 6/18  tolvaptan, mentating ok  - will give another dose of tolvaptan. Discussed dosing with PharmD personally. - fluid restrict  12. Petechial rash on R hand/L hand  - ? Cholesterol emboli. - neurovascular intact. No wound -  follow   13. Code status - Full Code  CRITICAL CARE Performed by: Glori Bickers  Total critical care time: 35 minutes  Critical care time was exclusive of separately billable procedures and treating other patients.  Critical care was necessary to treat or prevent imminent or life-threatening deterioration.  Critical care was time spent personally by me (independent of midlevel providers or residents) on the following activities: development of treatment plan with patient and/or surrogate as well as nursing, discussions with consultants, evaluation of patient's response to treatment, examination of patient, obtaining history from patient or surrogate, ordering and performing treatments and interventions, ordering and review of laboratory studies, ordering and review of radiographic studies, pulse oximetry and re-evaluation of patient's condition.    Length of Stay: [place], MD  06/25/2020, 8:22 AM  Advanced Heart Failure Team Pager [phone] (M-F; 7a - 5p)  Please contact [facility] for night-coverage after hours (5p -7a ) and weekends on [email or web address]

## 2020-06-25 NOTE — Progress Notes (Signed)
Placed patient on CPAP for the night with pressure set at 11cm and oxygen set at 2lpm.

## 2020-06-25 NOTE — TOC Progression Note (Addendum)
Transition of Care (TOC) - Progression Note  Heart Failure  Patient Details  Name: Wesley Winters MRN: [id number] Date of Birth: Sep 08, 1940  Transition of Care [facility]) CM/SW [place], [place] Phone Number: 06/25/2020, 1:07 PM  Clinical Narrative: CSW met with patient's spouse, Inez Catalina and daughter, Alyse Low regarding BCBS statements that came in the mail stating that the last hospital admission in May would not be covered. CSW instructed Inez Catalina to file an appeal and explain to [facility] that the patient was admitted from the [place] office due to the patient being in Afib and explain it was a direct urgent admission and then to call [employer] and have them review the account for the last admission. CSW provided written instructions with phone numbers for [facility]. Patient's daughter, Alyse Low asked for information about getting her dad an in-home aid for follow up care upon discharge for help with ADL's. CSW explained the process that will occur after rehab at the SNF and also provided Alyse Low with a list of home health agencies from the Medicare.gov website as she wanted to follow up herself regarding an aid.   Patient and family choose bed at [facility] when he is medically ready for discharge.  CSW will continue to follow throughout discharge.    Expected Discharge Plan: [place] Barriers to Discharge: Continued Medical Work up  Expected Discharge Plan and Services Expected Discharge Plan: [place] In-house Referral: Clinical Social Work Discharge Planning Services: CM Consult   Living arrangements for the past 2 months: Single Family Home                                       Social Determinants of Health (SDOH) Interventions    Readmission Risk Interventions No flowsheet data found.  Ola Fawver, MSW, [place] Heart Failure Social Worker

## 2020-06-25 NOTE — Progress Notes (Signed)
PROGRESS NOTE    Wesley Winters  ZDG:[id number] DOB: Nov 30, 1940 DOA: 06/14/2020 PCP: [name], MD   Chief Complain: Patient is 80 year old male with history of severe systolic congestive heart failure with ejection fraction 25% suspected to be from tachycardia induced, A. fib, coronary artery disease status post stenting, diabetes type 2 who initially presented to the emergency room with complaints of poor appetite, weakness, fall.  On presentation he was hypotensive, found to have intermittent episodes of A. fib with RVR.  Lab work showed worsening kidney function with creatinine in the range of 2, liver liver enzymes.  Patient was admitted for the management of cardiogenic shock.  Cardiology will primary team 06/24/2020, transferred to [facility] service on 06/25/2020.  On milrinone drip  Brief Narrative:   Assessment & Plan:   Principal Problem:   ARF (acute renal failure) (HCC) Active Problems:   Diabetes (HCC)   CAD in native artery   Persistent atrial fibrillation (HCC)   Chronic anticoagulation   Cardiogenic shock (HCC)   PAF (paroxysmal atrial fibrillation) (HCC)   Pressure injury of skin   Acute on chronic systolic congestive heart failure: Suspected to be from tachycardia induced cardiomyopathy.  Echo done on 5/22 showed ejection fraction of 20 to 25%, he was found to have elevated lactate level, hypotensive on presentation.  Started on inotrope support.  Currently on milrinone.  Also on digoxin.  Continue IV Lasix.  A. fib with RVR: S/p TEE/DCCV on 6/17, back to A. fib again.  Currently on IV amiodarone, on Eliquis for anticoagulation.  Monitor on telemetry.  Plan to repeat DCCV later this week.  EP also following.  Not a candidate for ablation  Coronary artery  disease: Status post history of LAD stenting.  Currently on aspirin.  Denies any chest pain.  Also on Lipitor.  AKI: Likely secondary to cardiogenic shock, ATN.  AKI has resolved and kidney function at  baseline.  Elevated liver enzymes: Most likely from shock liver from hypotension/Congestive hepatopathy .resolved  Diabetes type 2: Continue sliding scale insulin,lantus  Monitor blood sugars  Hypokalemia: Being monitored and supplemented if necessary.  Gram-negative bacteremia: Blood cultures showed Proteus species.  Likely source is urine but urine culture did not show any growth.  Procalcitonin was elevated.  ID was following.  Finishing 7 days course of ceftriaxone.  Sepsis physiology has resolved.  Leukocytosis improving.  Normocytic anemia: Hemoglobin in the range of 8.  Currently stable.  Anemia is most likely secondary to chronic medical conditions.  No evidence of acute blood loss  Urine retention: Failed voiding trial.  Continue Flomax.  Continue Foley catheter.  Needs urology follow-up as an outpatient Has some purulent drainage from meatus and also has a small ulcer on the foreskin.Already on abx  Left foot wound/pressure ulcers: Wound care following.  Petechial rash on the hands and feet: Unclear etiology.  Looks like an embolic physiology,we will continue monitoring.Platelets stable, kidney function stable.  Hyponatremia: Secondary to severe congestive heart failure with volume overload.  Given few doses of tolvaptan.  Continue fluid restriction.  Goals of care: Elderly patient with multiple comorbidities with severe congestive heart failure, poor prognosis.  Palliative care also following.  Remains full code.  Deconditioning/debility: PT/OT recommended skilled nursing facility on discharge.  TOC consulted  Obesity: BMI 35.3          DVT prophylaxis:Eliquis Code Status: Full Family Communication: None at bedside Status is: Inpatient  Remains inpatient appropriate because:Inpatient level of care appropriate due to severity  of illness  Dispo: The patient is from: Home              Anticipated d/c is to: SNF              Patient currently is not medically stable  to d/c.   Difficult to place patient No    Consultants: Cardiology  Procedures:DCCV  Antimicrobials:  Anti-infectives (From admission, onward)    Start     Dose/Rate Route Frequency Ordered Stop   06/19/20 2000  cefTRIAXone (ROCEPHIN) 2 g in sodium chloride 0.9 % 100 mL IVPB        2 g 200 mL/hr over 30 Minutes Intravenous Every 24 hours 06/19/20 1102 06/25/20 2359   06/19/20 1400  ceFEPIme (MAXIPIME) 2 g in sodium chloride 0.9 % 100 mL IVPB  Status:  Discontinued        2 g 200 mL/hr over 30 Minutes Intravenous Every 24 hours 06/18/20 1301 06/18/20 1322   06/19/20 0800  ceFEPIme (MAXIPIME) 2 g in sodium chloride 0.9 % 100 mL IVPB  Status:  Discontinued        2 g 200 mL/hr over 30 Minutes Intravenous Every 12 hours 06/19/20 0723 06/19/20 1102   06/18/20 1400  ceFEPIme (MAXIPIME) 2 g in sodium chloride 0.9 % 100 mL IVPB  Status:  Discontinued        2 g 200 mL/hr over 30 Minutes Intravenous Every 24 hours 06/18/20 1322 06/19/20 0723   06/18/20 1030  ceFEPIme (MAXIPIME) 2 g in sodium chloride 0.9 % 100 mL IVPB  Status:  Discontinued        2 g 200 mL/hr over 30 Minutes Intravenous Every 24 hours 06/18/20 0941 06/18/20 1301       Subjective:  Patient seen and examined at the bedside this morning.  Hemodynamically stable.  Comfortable.  He was in A. fib but heart rate is well controlled.  Denies any chest pain, shortness of breath or cough.  Appears comfortable and eating his lunch.  Complains of some discomfort on the Foley site.   Objective: Vitals:   06/25/20 0400 06/25/20 0500 06/25/20 0940 06/25/20 1000  BP: 115/65 117/65 112/66 102/64  Pulse: 72 70 75 72  Resp: 18 16 18  (!) 21  Temp:      TempSrc:      SpO2: 98% 100% 99% 94%  Weight:  102.6 kg    Height:        Intake/Output Summary (Last 24 hours) at 06/25/2020 1124 Last data filed at 06/25/2020 1100 Gross per 24 hour  Intake 1864.05 ml  Output 1795 ml  Net 69.05 ml   Filed Weights   06/23/20 0209 06/24/20  0500 06/25/20 0500  Weight: 93 kg 100.6 kg 102.6 kg    Examination:  General exam: Appears calm and comfortable ,Not in distress,obese, pleasant elderly gentleman  HEENT:PERRL,Oral mucosa moist, Ear/Nose normal on gross exam Respiratory system: Bilateral basal crackles  Cardiovascular system: Irregularly irregular rhythm.  No JVD, murmurs, rubs, gallops or clicks. . Gastrointestinal system: Abdomen is nondistended, soft and nontender. No organomegaly or masses felt. Normal bowel sounds heard. Central nervous system: Alert and oriented. No focal neurological deficits. Extremities: Trace bilateral lower extremity edema, no clubbing ,no cyanosis Skin: Petechial rash on the fingers and bilateral lower feet , pressure ulcers, skin breakdowns GU: Foley    Data Reviewed: I have personally reviewed following labs and imaging studies  CBC: Recent Labs  Lab 06/21/20 0044 06/22/20 6433 06/23/20 0204 06/24/20 0436  06/25/20 0240  WBC 14.1* 11.9* 12.4* 13.9* 12.9*  HGB 8.8* 8.6* 9.1* 9.1* 8.7*  HCT 28.7* 27.4* 29.5* 30.7* 28.8*  MCV 76.5* 77.0* 77.0* 78.1* 76.4*  PLT 282 221 230 259 518   Basic Metabolic Panel: Recent Labs  Lab 06/21/20 0044 06/22/20 0312 06/23/20 0204 06/24/20 0436 06/25/20 0240  NA 129* 129* 127* 128* 123*  K 4.4 3.1* 3.9 4.1 3.9  CL 91* 90* 91* 88* 86*  CO2 29 28 28 25 27   GLUCOSE 224* 249* 263* 233* 299*  BUN 36* 23 21 19 16   CREATININE 1.17 1.03 1.03 0.99 1.05  CALCIUM 7.9* 7.6* 7.4* 7.6* 7.6*  MG 2.1 1.5* 1.8 1.7 1.9   GFR: Estimated Creatinine Clearance: 63 mL/min (by C-G formula based on SCr of 1.05 mg/dL). Liver Function Tests: Recent Labs  Lab 06/20/20 0252 06/21/20 0044 06/22/20 0312 06/23/20 0204 06/24/20 0436  AST 83* 60* 46* 33 29  ALT 461* 320* 200* 160* 116*  ALKPHOS 116 132* 129* 132* 124  BILITOT 1.3* 0.8 0.8 0.9 0.9  PROT 5.4* 5.5* 4.9* 5.4* 5.4*  ALBUMIN 1.9* 1.9* 1.7* 1.8* 1.9*   No results for input(s): LIPASE, AMYLASE in  the last 168 hours. No results for input(s): AMMONIA in the last 168 hours. Coagulation Profile: No results for input(s): INR, PROTIME in the last 168 hours. Cardiac Enzymes: No results for input(s): CKTOTAL, CKMB, CKMBINDEX, TROPONINI in the last 168 hours. BNP (last 3 results) Recent Labs    07/11/19 1107 04/23/20 1446  PROBNP 445 2,488*   HbA1C: No results for input(s): HGBA1C in the last 72 hours. CBG: Recent Labs  Lab 06/24/20 1123 06/24/20 1608 06/24/20 2028 06/25/20 0621 06/25/20 0756  GLUCAP 147* 166* 155* 154* 159*   Lipid Profile: No results for input(s): CHOL, HDL, LDLCALC, TRIG, CHOLHDL, LDLDIRECT in the last 72 hours. Thyroid Function Tests: No results for input(s): TSH, T4TOTAL, FREET4, T3FREE, THYROIDAB in the last 72 hours. Anemia Panel: No results for input(s): VITAMINB12, FOLATE, FERRITIN, TIBC, IRON, RETICCTPCT in the last 72 hours. Sepsis Labs: No results for input(s): PROCALCITON, LATICACIDVEN in the last 168 hours.  Recent Results (from the past 240 hour(s))  Culture, blood (Routine X 2) w Reflex to ID Panel     Status: Abnormal   Collection Time: 06/18/20  9:37 AM   Specimen: BLOOD LEFT HAND  Result Value Ref Range Status   Specimen Description BLOOD LEFT HAND  Final   Special Requests   Final    BOTTLES DRAWN AEROBIC AND ANAEROBIC Blood Culture adequate volume   Culture  Setup Time   Final    GRAM NEGATIVE RODS IN BOTH AEROBIC AND ANAEROBIC BOTTLES CRITICAL VALUE NOTED.  VALUE IS CONSISTENT WITH PREVIOUSLY REPORTED AND CALLED VALUE.    Culture (A)  Final    PROTEUS MIRABILIS SUSCEPTIBILITIES PERFORMED ON PREVIOUS CULTURE WITHIN THE LAST 5 DAYS. Performed at [facility], [place] [address] [place], [place] [address]    Report Status 06/21/2020 FINAL  Final  Culture, blood (Routine X 2) w Reflex to ID Panel     Status: Abnormal   Collection Time: 06/18/20  9:42 AM   Specimen: BLOOD  Result Value Ref Range Status   Specimen Description  BLOOD RIGHT ANTECUBITAL  Final   Special Requests   Final    BOTTLES DRAWN AEROBIC AND ANAEROBIC Blood Culture adequate volume   Culture  Setup Time   Final    GRAM NEGATIVE RODS IN BOTH AEROBIC AND ANAEROBIC  BOTTLES CRITICAL RESULT CALLED TO, READ BACK BY AND VERIFIED WITH: PHARMD EMILY SINCLAIR AT 9767 ON 06/19/20 BY KJ Performed at [facility], [place] [address], [place], [place] [address]    Culture PROTEUS MIRABILIS (A)  Final   Report Status 06/21/2020 FINAL  Final   Organism ID, Bacteria PROTEUS MIRABILIS  Final      Susceptibility   Proteus mirabilis - MIC*    AMPICILLIN <=2 SENSITIVE Sensitive     CEFAZOLIN <=4 SENSITIVE Sensitive     CEFEPIME <=0.12 SENSITIVE Sensitive     CEFTAZIDIME <=1 SENSITIVE Sensitive     CEFTRIAXONE <=0.25 SENSITIVE Sensitive     CIPROFLOXACIN <=0.25 SENSITIVE Sensitive     GENTAMICIN <=1 SENSITIVE Sensitive     IMIPENEM 2 SENSITIVE Sensitive     TRIMETH/SULFA <=20 SENSITIVE Sensitive     AMPICILLIN/SULBACTAM <=2 SENSITIVE Sensitive     PIP/TAZO <=4 SENSITIVE Sensitive     * PROTEUS MIRABILIS  Blood Culture ID Panel (Reflexed)     Status: Abnormal   Collection Time: 06/18/20  9:42 AM  Result Value Ref Range Status   Enterococcus faecalis NOT DETECTED NOT DETECTED Final   Enterococcus Faecium NOT DETECTED NOT DETECTED Final   Listeria monocytogenes NOT DETECTED NOT DETECTED Final   Staphylococcus species NOT DETECTED NOT DETECTED Final   Staphylococcus aureus (BCID) NOT DETECTED NOT DETECTED Final   Staphylococcus epidermidis NOT DETECTED NOT DETECTED Final   Staphylococcus lugdunensis NOT DETECTED NOT DETECTED Final   Streptococcus species NOT DETECTED NOT DETECTED Final   Streptococcus agalactiae NOT DETECTED NOT DETECTED Final   Streptococcus pneumoniae NOT DETECTED NOT DETECTED Final   Streptococcus pyogenes NOT DETECTED NOT DETECTED Final   A.calcoaceticus-baumannii NOT DETECTED NOT DETECTED Final   Bacteroides fragilis NOT DETECTED  NOT DETECTED Final   Enterobacterales DETECTED (A) NOT DETECTED Final    Comment: Enterobacterales represent a large order of gram negative bacteria, not a single organism. CRITICAL RESULT CALLED TO, READ BACK BY AND VERIFIED WITH: PHARMD EMILY SINCLAIR AT 7902 ON 06/19/20 BY KJ    Enterobacter cloacae complex NOT DETECTED NOT DETECTED Final   Escherichia coli NOT DETECTED NOT DETECTED Final   Klebsiella aerogenes NOT DETECTED NOT DETECTED Final   Klebsiella oxytoca NOT DETECTED NOT DETECTED Final   Klebsiella pneumoniae NOT DETECTED NOT DETECTED Final   Proteus species DETECTED (A) NOT DETECTED Final    Comment: CRITICAL RESULT CALLED TO, READ BACK BY AND VERIFIED WITH: PHARMD EMILY SINCLAIR AT 1050 ON 06/19/20 BY KJ    Salmonella species NOT DETECTED NOT DETECTED Final   Serratia marcescens NOT DETECTED NOT DETECTED Final   Haemophilus influenzae NOT DETECTED NOT DETECTED Final   Neisseria meningitidis NOT DETECTED NOT DETECTED Final   Pseudomonas aeruginosa NOT DETECTED NOT DETECTED Final   Stenotrophomonas maltophilia NOT DETECTED NOT DETECTED Final   Candida albicans NOT DETECTED NOT DETECTED Final   Candida auris NOT DETECTED NOT DETECTED Final   Candida glabrata NOT DETECTED NOT DETECTED Final   Candida krusei NOT DETECTED NOT DETECTED Final   Candida parapsilosis NOT DETECTED NOT DETECTED Final   Candida tropicalis NOT DETECTED NOT DETECTED Final   Cryptococcus neoformans/gattii NOT DETECTED NOT DETECTED Final   CTX-M ESBL NOT DETECTED NOT DETECTED Final   Carbapenem resistance IMP NOT DETECTED NOT DETECTED Final   Carbapenem resistance KPC NOT DETECTED NOT DETECTED Final   Carbapenem resistance NDM NOT DETECTED NOT DETECTED Final   Carbapenem resist OXA 48 LIKE NOT DETECTED NOT DETECTED  Final   Carbapenem resistance VIM NOT DETECTED NOT DETECTED Final    Comment: Performed at [facility], [place] [address], [place], [place] [address]         Radiology Studies: No  results found.      Scheduled Meds:  apixaban  5 mg Oral BID   atorvastatin  40 mg Oral Daily   Chlorhexidine Gluconate Cloth  6 each Topical Q0600   digoxin  0.125 mg Oral Daily   docusate sodium  200 mg Oral BID   insulin aspart  0-15 Units Subcutaneous TID WC   insulin aspart  0-5 Units Subcutaneous QHS   insulin glargine  10 Units Subcutaneous QHS   mupirocin ointment   Topical Q12H   potassium chloride SA  40 mEq Oral Daily   senna  2 tablet Oral Daily   sodium chloride flush  10-40 mL Intracatheter Q12H   tamsulosin  0.4 mg Oral QPC supper   vitamin B-12  1,000 mcg Oral QHS   Continuous Infusions:  sodium chloride Stopped (06/23/20 0817)   amiodarone 60 mg/hr (06/25/20 1100)   cefTRIAXone (ROCEPHIN)  IV 2 g (06/24/20 2008)   milrinone 0.375 mcg/kg/min (06/25/20 1100)   norepinephrine (LEVOPHED) Adult infusion 3 mcg/min (06/25/20 1100)     LOS: 11 days    Time spent: 35 mins.More than 50% of that time was spent in counseling and/or coordination of care.      Shelly Coss, MD [facility] P6/22/2022, 11:24 AM

## 2020-06-25 NOTE — Consult Note (Signed)
WOC Nurse Consult Note: Patient receiving care in [facility] 2H20 At assessment of this patient he has petechiae covering both hands and feet and purulent drainage from the penis.   Reason for Consult:Wound on Penis Wound type: MASD/IAD on anterior side of the penis r/t purulent discharge. Stage 2 on left and right buttock Pressure Injury POA: No Measurement: Left buttock: 2.7 cm x 2.0 cm pink and moist Right buttock: 2.3 cm x 1.0 cm pink and moist Periwound: Intact Dressing procedure/placement/frequency: Clean the sacral area with no rinse cleanser then apply Xeroform to both wounds and secure with Sacral Foam Dressing.  Clean the penis and groin area with soap and water, dry then apply Desitin cream to surrounding area that is exposed to the purulent drainage. May use the Desitin in other MASD areas if needed.   Monitor the wound area(s) for worsening of condition such as: Signs/symptoms of infection, increase in size, development of or worsening of odor, development of pain, or increased pain at the affected locations.   Notify the medical team if any of these develop.  Thank you for the consult. [place]ty nurse will not follow at this time.   Please re-consult the [facility] team if needed.  Cathlean Marseilles Tamala Julian, MSN, RN, [place], [place]le [place], [facility] Wound Treatment Associate Pager [phone]

## 2020-06-26 LAB — BASIC METABOLIC PANEL
Anion gap: 9 (ref 5–15)
BUN: 13 mg/dL (ref 8–23)
CO2: 27 mmol/L (ref 22–32)
Calcium: 7.2 mg/dL — ABNORMAL LOW (ref 8.9–10.3)
Chloride: 90 mmol/L — ABNORMAL LOW (ref 98–111)
Creatinine, Ser: 0.95 mg/dL (ref 0.61–1.24)
GFR, Estimated: >60 mL/min (ref >60)
Glucose, Bld: 391 mg/dL — ABNORMAL HIGH (ref 70–99)
Potassium: 3.6 mmol/L (ref 3.5–5.1)
Sodium: 126 mmol/L — ABNORMAL LOW (ref 135–145)

## 2020-06-26 LAB — CBC
HCT: 29 % — ABNORMAL LOW (ref 39.0–52.0)
Hemoglobin: 8.7 g/dL — ABNORMAL LOW (ref 13.0–17.0)
MCH: 23.3 pg — ABNORMAL LOW (ref 26.0–34.0)
MCHC: 30 g/dL (ref 30.0–36.0)
MCV: 77.7 fL — ABNORMAL LOW (ref 80.0–100.0)
Platelets: 267 10*3/uL (ref 150–400)
RBC: 3.73 MIL/uL — ABNORMAL LOW (ref 4.22–5.81)
RDW: 20.7 % — ABNORMAL HIGH (ref 11.5–15.5)
WBC: 12.7 10*3/uL — ABNORMAL HIGH (ref 4.0–10.5)
nRBC: 0 % (ref 0.0–0.2)

## 2020-06-26 LAB — GLUCOSE, CAPILLARY
Glucose-Capillary: 166 mg/dL — ABNORMAL HIGH (ref 70–99)
Glucose-Capillary: 159 mg/dL — ABNORMAL HIGH (ref 70–99)
Glucose-Capillary: 138 mg/dL — ABNORMAL HIGH (ref 70–99)
Glucose-Capillary: 197 mg/dL — ABNORMAL HIGH (ref 70–99)
Glucose-Capillary: 186 mg/dL — ABNORMAL HIGH (ref 70–99)

## 2020-06-26 LAB — COOXEMETRY PANEL
Carboxyhemoglobin: 1.1 % (ref 0.5–1.5)
Carboxyhemoglobin: 1.3 % (ref 0.5–1.5)
Methemoglobin: 0.8 % (ref 0.0–1.5)
Methemoglobin: 0.9 % (ref 0.0–1.5)
O2 Saturation: 41.9 %
O2 Saturation: 55.7 %
Total hemoglobin: 9.5 g/dL — ABNORMAL LOW (ref 12.0–16.0)
Total hemoglobin: 9.2 g/dL — ABNORMAL LOW (ref 12.0–16.0)

## 2020-06-26 LAB — MAGNESIUM: Magnesium: 1.8 mg/dL (ref 1.7–2.4)

## 2020-06-26 LAB — SODIUM: Sodium: 130 mmol/L — ABNORMAL LOW (ref 135–145)

## 2020-06-26 MED ORDER — POTASSIUM CHLORIDE CRYS ER 20 MEQ PO TBCR
40.0000 meq | EXTENDED_RELEASE_TABLET | Freq: Once | ORAL | Status: AC
  Administered 2020-06-26: 40 meq via ORAL
  Filled 2020-06-26: qty 2

## 2020-06-26 MED ORDER — SODIUM CHLORIDE 0.9 % IV SOLN
INTRAVENOUS | Status: DC | PRN
  Administered 2020-06-26: 500 mL via INTRAVENOUS

## 2020-06-26 MED ORDER — FUROSEMIDE 10 MG/ML IJ SOLN
80.0000 mg | Freq: Once | INTRAMUSCULAR | Status: AC
  Administered 2020-06-26: 80 mg via INTRAVENOUS
  Filled 2020-06-26: qty 8

## 2020-06-26 MED ORDER — MAGNESIUM SULFATE 4 GM/100ML IV SOLN
4.0000 g | Freq: Once | INTRAVENOUS | Status: AC
  Administered 2020-06-26: 4 g via INTRAVENOUS
  Filled 2020-06-26: qty 100

## 2020-06-26 NOTE — Progress Notes (Signed)
Placed patient on CPAP for the night at 11cm and oxygen set at 2lpm.

## 2020-06-26 NOTE — Progress Notes (Signed)
Physical Therapy Treatment Patient Details Name: Wesley Winters MRN: [id number] DOB: Dec 20, 1940 Today's Date: 06/26/2020    History of Present Illness 80 yo admitted 6/11 after 2 falls at home with fatigue and weakness with ARF. 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 Pt continues to requires cues and direction for all transfers, mobility and encouragement with assist for pericare and chair follow for gait due to fatigue. Encouraged OOB daily with nursing and continued mobility. Will continue to follow with D/C plan appropriate.   HR 112-126 SpO2 93-98% on RA Pre gait 113/68 During gait 119/58 (77)    Follow Up Recommendations  SNF;Supervision for mobility/OOB     Equipment Recommendations  Rolling walker with 5" wheels;3in1 (PT)    Recommendations for Other Services       Precautions / Restrictions Precautions Precautions: Fall Restrictions Weight Bearing Restrictions: No    Mobility  Bed Mobility Overal bed mobility: Needs Assistance Bed Mobility: Supine to Sit     Supine to sit: Min guard     General bed mobility comments: HOB 15 degrees with cues for use of rail and sequence with guarding for lines but no physical assist    Transfers Overall transfer level: Needs assistance   Transfers: Sit to/from Stand Sit to Stand: Min assist         General transfer comment: min assist to rise from bed, toilet and recliner with cues for hand placement total of 4 trials  Ambulation/Gait Ambulation/Gait assistance: Min guard;+2 safety/equipment Gait Distance (Feet): 120 Feet Assistive device: Rolling walker (2 wheeled) Gait Pattern/deviations: Step-through pattern;Decreased stride length;Trunk flexed   Gait velocity interpretation: 1.31 - 2.62 ft/sec, indicative of limited community ambulator General Gait Details: cues for posture,  position in RW and chair follow. Pt walked 10', 10', 44', 120' with chair follow and seated rest between all trials   Stairs             Wheelchair Mobility    Modified Rankin (Stroke Patients Only)       Balance Overall balance assessment: Needs assistance Sitting-balance support: No upper extremity supported;Feet supported Sitting balance-Leahy Scale: Good Sitting balance - Comments: toilet and EOB without support   Standing balance support: Bilateral upper extremity supported Standing balance-Leahy Scale: Poor Standing balance comment: RW for standing and gait                            Cognition Arousal/Alertness: Awake/alert Behavior During Therapy: WFL for tasks assessed/performed Overall Cognitive Status: Impaired/Different from baseline                           Safety/Judgement: Decreased awareness of deficits   Problem Solving: Slow processing;Requires verbal cues        Exercises General Exercises - Lower Extremity Long Arc Quad: AROM;Both;15 reps;Seated Hip Flexion/Marching: AROM;Both;Seated;15 reps    General Comments        Pertinent Vitals/Pain Pain Assessment: No/denies pain    Home Living                      Prior Function            PT Goals (current goals can now be found in the care plan section) Progress towards PT goals: Progressing toward goals  Frequency    Min 3X/week      PT Plan Current plan remains appropriate    Co-evaluation              AM-PAC PT "6 Clicks" Mobility   Outcome Measure  Help needed turning from your back to your side while in a flat bed without using bedrails?: A Little Help needed moving from lying on your back to sitting on the side of a flat bed without using bedrails?: A Little Help needed moving to and from a bed to a chair (including a wheelchair)?: A Little Help needed standing up from a chair using your arms (e.g., wheelchair or bedside chair)?:  A Little Help needed to walk in hospital room?: A Little Help needed climbing 3-5 steps with a railing? : A Lot 6 Click Score: 17    End of Session Equipment Utilized During Treatment: Gait belt Activity Tolerance: Patient tolerated treatment well Patient left: in chair;with call bell/phone within reach;with chair alarm set Nurse Communication: Mobility status PT Visit Diagnosis: Other abnormalities of gait and mobility (R26.89);Difficulty in walking, not elsewhere classified (R26.2)     Time: 2010-0712 PT Time Calculation (min) (ACUTE ONLY): 35 min  Charges:  $Gait Training: 8-22 mins $Therapeutic Activity: 8-22 mins                     Emlyn Maves P, PT Acute Rehabilitation Services Pager: [phone] Office: [place] 06/26/2020, 10:37 AM

## 2020-06-26 NOTE — Progress Notes (Addendum)
Advanced Heart Failure Rounding Note  PCP-Cardiologist: Sinclair Grooms, MD   Subjective:   Cardiogenic shock. New onset AF.   6/12 CO-OX 43% --->Started on milrinone 0.25 mcg.  6/13 Milrinone increased to 0.375. Co-ox 58% today.  6/15 Back in Afib w/ RVR, amio rebolused, rate increased to 60  6/15 Fever spike + leukocytosis + hypotension>>started on empiric cefepime + NE. BCx with proteus 6/17 TEE/DC-CV briefly in SR 6/19 Back in AF. Milrinone cut back to 0.25 mcg.  6/20 Developed petechiae R fingers.  6/21  Milrinone increased to 0.375 and NE 3 added.  6/22 Sodium 123 Given tolvaptan.   Sodium 123>126   CO-OX 42%. On Milrinone 0.375 mcg + Norepi 3 mcg.   Denies SOB.   Objective:   Weight Range: 102.6 kg Body mass index is 34.39 kg/m.   Vital Signs:   Temp:  [98.4 F (36.9 C)-98.8 F (37.1 C)] 98.4 F (36.9 C) (06/23 0000) Pulse Rate:  [64-114] 91 (06/23 0745) Resp:  [13-35] 13 (06/23 0745) BP: (102-154)/(52-136) 114/64 (06/23 0745) SpO2:  [94 %-100 %] 100 % (06/23 0745) Last BM Date: 06/24/20  Weight change: Filed Weights   06/23/20 0209 06/24/20 0500 06/25/20 0500  Weight: 93 kg 100.6 kg 102.6 kg    Intake/Output:   Intake/Output Summary (Last 24 hours) at 06/26/2020 0757 Last data filed at 06/26/2020 0600 Gross per 24 hour  Intake 1643.77 ml  Output 3350 ml  Net -1706.23 ml    CVP 12 personally checked.   Physical Exam  General:   No resp difficulty HEENT: normal Neck: supple. JVP 11-12  Carotids 2+ bilat; no bruits. No lymphadenopathy or thryomegaly appreciated. Cor: PMI nondisplaced. Irregular rate & rhythm. No rubs, gallops or murmurs. Lungs: clear Abdomen: soft, nontender, nondistended. No hepatosplenomegaly. No bruits or masses. Good bowel sounds. Extremities: no cyanosis, clubbing, rash, R and LLE ted hose.  Neuro: alert & orientedx3, cranial nerves grossly intact. moves all 4 extremities w/o difficulty. Affect pleasant GU :  Foley Skin: Petechiae right and left fingers and R toes    Telemetry    A fib 100s personally removed.   Labs    CBC Recent Labs    06/25/20 0240 06/26/20 0514  WBC 12.9* 12.7*  HGB 8.7* 8.7*  HCT 28.8* 29.0*  MCV 76.4* 77.7*  PLT 269 476   Basic Metabolic Panel Recent Labs    06/25/20 0240 06/25/20 1754 06/25/20 2343 06/26/20 0514  NA 123*   < > 130* 126*  K 3.9  --   --  3.6  CL 86*  --   --  90*  CO2 27  --   --  27  GLUCOSE 299*  --   --  391*  BUN 16  --   --  13  CREATININE 1.05  --   --  0.95  CALCIUM 7.6*  --   --  7.2*  MG 1.9  --   --  1.8   < > = values in this interval not displayed.   Liver Function Tests Recent Labs    06/24/20 0436  AST 29  ALT 116*  ALKPHOS 124  BILITOT 0.9  PROT 5.4*  ALBUMIN 1.9*   No results for input(s): LIPASE, AMYLASE in the last 72 hours. Cardiac Enzymes No results for input(s): CKTOTAL, CKMB, CKMBINDEX, TROPONINI in the last 72 hours.  BNP: BNP (last 3 results) Recent Labs    05/26/20 1429 06/15/20 0729  BNP 1,609.8* 2,424.4*  ProBNP (last 3 results) Recent Labs    07/11/19 1107 04/23/20 1446  PROBNP 445 2,488*     D-Dimer No results for input(s): DDIMER in the last 72 hours. Hemoglobin A1C No results for input(s): HGBA1C in the last 72 hours. Fasting Lipid Panel No results for input(s): CHOL, HDL, LDLCALC, TRIG, CHOLHDL, LDLDIRECT in the last 72 hours. Thyroid Function Tests No results for input(s): TSH, T4TOTAL, T3FREE, THYROIDAB in the last 72 hours.  Invalid input(s): FREET3   Other results:   Imaging    No results found.   Medications:     Scheduled Medications:  apixaban  5 mg Oral BID   atorvastatin  40 mg Oral Daily   Chlorhexidine Gluconate Cloth  6 each Topical Q0600   digoxin  0.125 mg Oral Daily   docusate sodium  200 mg Oral BID   insulin aspart  0-15 Units Subcutaneous TID WC   insulin aspart  0-5 Units Subcutaneous QHS   insulin glargine  10 Units  Subcutaneous QHS   mupirocin ointment   Topical Q12H   potassium chloride SA  40 mEq Oral Daily   senna  2 tablet Oral Daily   sodium chloride flush  10-40 mL Intracatheter Q12H   tamsulosin  0.4 mg Oral QPC supper   vitamin B-12  1,000 mcg Oral QHS    Infusions:  sodium chloride Stopped (06/23/20 0817)   amiodarone 60 mg/hr (06/26/20 0600)   milrinone 0.375 mcg/kg/min (06/26/20 0600)   norepinephrine (LEVOPHED) Adult infusion 3 mcg/min (06/26/20 0600)    PRN Medications:   Assessment/Plan    Acute systolic HF -> 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 Lactate 9.9  - doubt ischemic as hstrop low despite profound hemodynamic support but given h/p CAD and multiple CRFs will need ischemic eval once more stable and renal function improves - Failing inotrope wean. Milrinone increased to 0.375 and NE 3 added on 6/21. CO-OX 42%  - Continue digoxin  - CVP 12. Give 80 mg IV lasix x1.  - Off GDMT due to need for inotropes  2. CAD - s/p previous LAD stenting - doubt ischemia is major factor in HF given low hsTrop - continue ASA - Continue lipitor 40 mg daily.  - No s/s ischemia   3. AF with RVR - Eliquis stopped 6/12 and switched to heparin.  - TEE/DCCV on 6/17. Back in AF overnight - Continue IV amio while on inotropes - Continue Eliquis  - Remains in A fib.  - Would like to try and cardiovert but remain on pressors.  - EP reconsulted. Not currently a candidate for ablation or CRT-P with wounds.   4.  AKI - due to ATN and shock - peak creatine 2.9 - Resolved.   5. Shock liver - resolved   6. DM2 - SSI    7. Hypokalemia  -Supp K   8. Sepsis/GN Bacteremia  - PCT 2.7. CXR w/o infiltrate. Foley replaced . BCx Proteus Species.  - Abx narrowed from cefepime to ceftriaxone per ID. Has completed 7  days - sepsis resolved  9. Urinary retention - failed Foley removal - continue Flomax  - will need repeat voiding trial soon   10. Wound on left foot - deep but clean based - WC recs bid dressing changes   11. Hyponatremia - Na  124-> 129 ->127 ->128 -> 123->126  - 6/18 and 6/22 tolvaptan, mentating ok  -  12. Petechial rash on R hand/L hand /R foot  - ? Cholesterol emboli. - neurovascular intact. No wound - follow   13. Code status - Full Code - Palliative following for goals of care.   Length of Stay: House, NP  06/26/2020, 7:57 AM  Advanced Heart Failure Team Pager [phone] (M-F; 7a - 5p)  Please contact [facility] for night-coverage after hours (5p -7a ) and weekends on [email or web address]   Agree with above. Remains on NE 3 and milrinone 0.375. Co-ox back down to 42% CVP up. Feels ok. Denies SOB, orthopnea or PND. Sodium up slightly with tolvaptan. Weight recorded as up 20 pounds  Was in NSR from 11a to 2a yesterday. Now back in AF  General:  Weak appearing. No resp difficulty HEENT: normal Neck: supple. + JVP. Carotids 2+ bilat; no bruits. No lymphadenopathy or thryomegaly appreciated. Cor: PMI nondisplaced. Irregular rate & rhythm. No rubs, gallops or murmurs. Lungs: clear Abdomen: soft, nontender, nondistended. No hepatosplenomegaly. No bruits or masses. Good bowel sounds. Extremities: no cyanosis, clubbing, rash, tr edema Neuro: alert & orientedx3, cranial nerves grossly intact. moves all 4 extremities w/o difficulty. Affect pleasant  He is in/out AF. Co-ox remains low despite NE and milrinone. Will repeat. If outputs do not to begin to improve soon will need to re-explore GOC. Will need Foley wean  CRITICAL CARE Performed by: Glori Bickers  Total critical care time: 35 minutes  Critical care time was exclusive of separately billable procedures and treating other patients.  Critical care was necessary to treat or prevent imminent or  life-threatening deterioration.  Critical care was time spent personally by me (independent of midlevel providers or residents) on the following activities: development of treatment plan with patient and/or surrogate as well as nursing, discussions with consultants, evaluation of patient's response to treatment, examination of patient, obtaining history from patient or surrogate, ordering and performing treatments and interventions, ordering and review of laboratory studies, ordering and review of radiographic studies, pulse oximetry and re-evaluation of patient's condition.  Glori Bickers, MD  9:50 AM

## 2020-06-26 NOTE — Progress Notes (Signed)
[facility] Progress Note  Patient: Wesley Winters    UXN:[id number]  DOA: 06/14/2020     Date of Service: the patient was seen and examined on 06/26/2020  Brief hospital course: Patient is 80 year old male with history of severe systolic congestive heart failure with ejection fraction 25% suspected to be from tachycardia induced, A. fib, coronary artery disease status post stenting, diabetes type 2 who initially presented to the emergency room with complaints of poor appetite, weakness, fall.  On presentation he was hypotensive, found to have intermittent episodes of A. fib with RVR.  Lab work showed worsening kidney function with creatinine in the range of 2, liver liver enzymes.  Patient was admitted for the management of cardiogenic shock.  Cardiology will primary team 06/24/2020, transferred to [facility] service on 06/25/2020.  On milrinone drip  Currently plan is transferring the patient to the ICU under cardiology service to initiate on Levophed drip.  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  Echo done on 5/22 showed ejection fraction of 20 to 25%, he was found to have elevated lactate level, hypotensive on presentation.  Started on inotrope support.  Currently on milrinone.  Also on digoxin.  Continue IV Lasix. Started on Levophed drip and will be transferred to the ICU under cardiology service   A. fib with RVR: S/p TEE/DCCV on 6/17, back to A. fib again.  Currently on IV amiodarone, on Eliquis for anticoagulation.  Monitor on telemetry.  Plan to repeat DCCV later this week.  EP also following.  Not a candidate for ablation   Coronary artery  disease: Status post history of LAD stenting.  Currently on aspirin.  Denies any chest pain.  Also on Lipitor.   AKI: Likely secondary to cardiogenic shock, ATN.  AKI has resolved and kidney function at baseline.   Elevated liver enzymes: Most likely from shock  liver from hypotension/Congestive hepatopathy .resolved   Diabetes type 2: Continue sliding scale insulin,lantus  Monitor blood sugars   Hypokalemia: Being monitored and supplemented if necessary.   Gram-negative bacteremia: Blood cultures showed Proteus species.  Likely source is urine but urine culture did not show any growth.  Procalcitonin was elevated.  ID was following.  Finishing 7 days course of ceftriaxone.  Sepsis physiology has resolved.  Leukocytosis improving.   Normocytic anemia: Hemoglobin in the range of 8.  Currently stable.  Anemia is most likely secondary to chronic medical conditions.  No evidence of acute blood loss   Urine retention: Failed voiding trial.  Continue Flomax.  Continue Foley catheter.  Needs urology follow-up as an outpatient Has some purulent drainage from meatus and also has a small ulcer on the foreskin.Already on abx   Left foot wound/pressure ulcers: Wound care following.   Petechial rash on the hands and feet: Unclear etiology.  Looks like an embolic physiology,we will continue monitoring.Platelets stable, kidney function stable.   Hyponatremia: Secondary to severe congestive heart failure with volume overload.  Given few doses of tolvaptan.  Continue fluid restriction.   Goals of care: Elderly patient with multiple comorbidities with severe congestive heart failure, poor prognosis.  Palliative care also following.  Remains full code.   Deconditioning/debility: PT/OT recommended skilled nursing facility on discharge.  TOC consulted   Obesity:  Body mass index is 32.92 kg/m.   Pressure Injury 06/25/20 Buttocks Left Stage 2 -  Partial thickness loss of dermis presenting as a shallow open injury with a red, pink  wound bed without slough. 2.7 cm X 2.0 cm open stage 2, surrounding area is blanchable (Active)  06/25/20 0810  Location: Buttocks  Location Orientation: Left  Staging: Stage 2 -  Partial thickness loss of dermis presenting as a shallow  open injury with a red, pink wound bed without slough.  Wound Description (Comments): 2.7 cm X 2.0 cm open stage 2, surrounding area is blanchable  Present on Admission: No     Pressure Injury 06/25/20 Buttocks Right Stage 2 -  Partial thickness loss of dermis presenting as a shallow open injury with a red, pink wound bed without slough. 2.3 cm X 1.0 cm stage 2, surrounding area is blanchable (Active)  06/25/20 0811  Location: Buttocks  Location Orientation: Right  Staging: Stage 2 -  Partial thickness loss of dermis presenting as a shallow open injury with a red, pink wound bed without slough.  Wound Description (Comments): 2.3 cm X 1.0 cm stage 2, surrounding area is blanchable  Present on Admission: No     Diet: Cardiac diet DVT Prophylaxis:   Place TED hose Start: 06/16/20 1758 apixaban (ELIQUIS) tablet 5 mg    Advance goals of care discussion: Full code  Family Communication: family was present at bedside, at the time of interview.  The pt provided permission to discuss medical plan with the family. Opportunity was given to ask question and all questions were answered satisfactorily.   Disposition:  Status is: Inpatient  Remains inpatient appropriate because:Inpatient level of care appropriate due to severity of illness  Dispo: The patient is from: Home              Anticipated d/c is to: Home              Patient currently is not medically stable to d/c.   Difficult to place patient No        Subjective: Continues to have shortness of breath.  No nausea no vomiting no fever no chills.  No diarrhea.  Physical Exam:  General: Appear in mild distress, no Rash; Oral Mucosa Clear, moist. no Abnormal Neck Mass Or lumps, Conjunctiva normal  Cardiovascular: S1 and S2 Present, no Murmur, Respiratory: increased respiratory effort, Bilateral Air entry present and bilateral  Crackles, no wheezes Abdomen: Bowel Sound present, Soft and no tenderness Extremities: trace Pedal  edema Neurology: alert and oriented to time, place, and person affect appropriate. no new focal deficit Gait not checked due to patient safety concerns    Vitals:   06/26/20 1931 06/26/20 1945 06/26/20 2000 06/26/20 2015  BP:  134/84 (!) 135/59 131/79  Pulse:  (!) 104 (!) 106 (!) 114  Resp:  20 15 17   Temp: 97.9 F (36.6 C)     TempSrc: Oral     SpO2:  100% 100% 100%  Weight:      Height:        Intake/Output Summary (Last 24 hours) at 06/26/2020 2044 Last data filed at 06/26/2020 2000 Gross per 24 hour  Intake 1596.82 ml  Output 5355 ml  Net -3758.18 ml   Filed Weights   06/25/20 0500 06/26/20 0800 06/26/20 0915  Weight: 102.6 kg 98.2 kg 98.2 kg    Data Reviewed: I have personally reviewed and interpreted daily labs, tele strips, imaging. I reviewed all nursing notes, pharmacy notes, vitals, pertinent old records I have discussed plan of care as described above with RN and patient/family.  CBC: Recent Labs  Lab 06/22/20 0312 06/23/20 0204 06/24/20 0436 06/25/20 0240 06/26/20  0514  WBC 11.9* 12.4* 13.9* 12.9* 12.7*  HGB 8.6* 9.1* 9.1* 8.7* 8.7*  HCT 27.4* 29.5* 30.7* 28.8* 29.0*  MCV 77.0* 77.0* 78.1* 76.4* 77.7*  PLT 221 230 259 269 619   Basic Metabolic Panel: Recent Labs  Lab 06/22/20 0312 06/23/20 0204 06/24/20 0436 06/25/20 0240 06/25/20 1754 06/25/20 2343 06/26/20 0514  NA 129* 127* 128* 123* 125* 130* 126*  K 3.1* 3.9 4.1 3.9  --   --  3.6  CL 90* 91* 88* 86*  --   --  90*  CO2 28 28 25 27   --   --  27  GLUCOSE 249* 263* 233* 299*  --   --  391*  BUN 23 21 19 16   --   --  13  CREATININE 1.03 1.03 0.99 1.05  --   --  0.95  CALCIUM 7.6* 7.4* 7.6* 7.6*  --   --  7.2*  MG 1.5* 1.8 1.7 1.9  --   --  1.8    Studies: No results found.  Scheduled Meds:  apixaban  5 mg Oral BID   atorvastatin  40 mg Oral Daily   Chlorhexidine Gluconate Cloth  6 each Topical Q0600   digoxin  0.125 mg Oral Daily   docusate sodium  200 mg Oral BID   insulin  aspart  0-15 Units Subcutaneous TID WC   insulin aspart  0-5 Units Subcutaneous QHS   insulin glargine  10 Units Subcutaneous QHS   mupirocin ointment   Topical Q12H   potassium chloride SA  40 mEq Oral Daily   senna  2 tablet Oral Daily   sodium chloride flush  10-40 mL Intracatheter Q12H   tamsulosin  0.4 mg Oral QPC supper   vitamin B-12  1,000 mcg Oral QHS   Continuous Infusions:  sodium chloride Stopped (06/23/20 0817)   sodium chloride 10 mL/hr at 06/26/20 2000   amiodarone 60 mg/hr (06/26/20 2000)   milrinone 0.375 mcg/kg/min (06/26/20 2000)   norepinephrine (LEVOPHED) Adult infusion 3 mcg/min (06/26/20 2000)   PRN Meds: sodium chloride, acetaminophen, liver oil-zinc oxide, ondansetron (ZOFRAN) IV, sodium chloride flush  Time spent: 35 minutes  Author: Berle Mull, MD [facility] 06/26/2020 8:44 PM  To reach On-call, see care teams to locate the attending and reach out via [email or web address]. Between 7PM-7AM, please contact night-coverage If you still have difficulty reaching the attending provider, please page the [facility] (Director on Call) for [facility] on amion for assistance.

## 2020-06-27 LAB — BASIC METABOLIC PANEL
Anion gap: 7 (ref 5–15)
BUN: 10 mg/dL (ref 8–23)
CO2: 22 mmol/L (ref 22–32)
Calcium: 5.7 mg/dL — CL (ref 8.9–10.3)
Chloride: 104 mmol/L (ref 98–111)
Creatinine, Ser: 0.73 mg/dL (ref 0.61–1.24)
GFR, Estimated: >60 mL/min (ref >60)
Glucose, Bld: 212 mg/dL — ABNORMAL HIGH (ref 70–99)
Potassium: 3 mmol/L — ABNORMAL LOW (ref 3.5–5.1)
Sodium: 133 mmol/L — ABNORMAL LOW (ref 135–145)

## 2020-06-27 LAB — CBC
HCT: 30.3 % — ABNORMAL LOW (ref 39.0–52.0)
Hemoglobin: 8.8 g/dL — ABNORMAL LOW (ref 13.0–17.0)
MCH: 22.8 pg — ABNORMAL LOW (ref 26.0–34.0)
MCHC: 29 g/dL — ABNORMAL LOW (ref 30.0–36.0)
MCV: 78.5 fL — ABNORMAL LOW (ref 80.0–100.0)
Platelets: 289 10*3/uL (ref 150–400)
RBC: 3.86 MIL/uL — ABNORMAL LOW (ref 4.22–5.81)
RDW: 20.8 % — ABNORMAL HIGH (ref 11.5–15.5)
WBC: 11.9 10*3/uL — ABNORMAL HIGH (ref 4.0–10.5)
nRBC: 0 % (ref 0.0–0.2)

## 2020-06-27 LAB — GLUCOSE, CAPILLARY
Glucose-Capillary: 167 mg/dL — ABNORMAL HIGH (ref 70–99)
Glucose-Capillary: 147 mg/dL — ABNORMAL HIGH (ref 70–99)
Glucose-Capillary: 133 mg/dL — ABNORMAL HIGH (ref 70–99)
Glucose-Capillary: 139 mg/dL — ABNORMAL HIGH (ref 70–99)

## 2020-06-27 LAB — COOXEMETRY PANEL
Carboxyhemoglobin: 1.1 % (ref 0.5–1.5)
Carboxyhemoglobin: 0.9 % (ref 0.5–1.5)
Methemoglobin: 0.7 % (ref 0.0–1.5)
Methemoglobin: 0.7 % (ref 0.0–1.5)
O2 Saturation: 48.5 %
O2 Saturation: 33.1 %
Total hemoglobin: 9 g/dL — ABNORMAL LOW (ref 12.0–16.0)
Total hemoglobin: 9.5 g/dL — ABNORMAL LOW (ref 12.0–16.0)

## 2020-06-27 LAB — MAGNESIUM: Magnesium: 1.4 mg/dL — ABNORMAL LOW (ref 1.7–2.4)

## 2020-06-27 MED ORDER — POTASSIUM CHLORIDE CRYS ER 20 MEQ PO TBCR
40.0000 meq | EXTENDED_RELEASE_TABLET | ORAL | Status: AC
  Administered 2020-06-27 (×2): 40 meq via ORAL
  Filled 2020-06-27 (×2): qty 2

## 2020-06-27 MED ORDER — MAGNESIUM SULFATE 4 GM/100ML IV SOLN
4.0000 g | Freq: Once | INTRAVENOUS | Status: AC
  Administered 2020-06-27: 4 g via INTRAVENOUS
  Filled 2020-06-27: qty 100

## 2020-06-27 MED ORDER — FUROSEMIDE 40 MG PO TABS
40.0000 mg | ORAL_TABLET | Freq: Every day | ORAL | Status: DC
  Administered 2020-06-27 – 2020-06-28 (×2): 40 mg via ORAL
  Filled 2020-06-27 (×2): qty 1

## 2020-06-27 NOTE — Progress Notes (Signed)
Advanced Heart Failure Rounding Note  PCP-Cardiologist: Sinclair Grooms, MD   Subjective:   Cardiogenic shock. New onset AF.   6/12 CO-OX 43% --->Started on milrinone 0.25 mcg.  6/13 Milrinone increased to 0.375. Co-ox 58% today.  6/15 Back in Afib w/ RVR, amio rebolused, rate increased to 60  6/15 Fever spike + leukocytosis + hypotension>>started on empiric cefepime + NE. BCx with proteus 6/17 TEE/DC-CV briefly in SR 6/19 Back in AF. Milrinone cut back to 0.25 mcg.  6/20 Developed petechiae R fingers.  6/21  Milrinone increased to 0.375 and NE 3 added. 6/22 Sodium 123 Given tolvaptan.   Sodium 123>126> 133  CO-OX 42%-> 49%. On Milrinone 0.375 mcg + Norepi 3 mcg.   Feels ok. Denies CP or SOB    Objective:   Weight Range: 98.2 kg Body mass index is 32.92 kg/m.   Vital Signs:   Temp:  [97.7 F (36.5 C)-97.9 F (36.6 C)] 97.8 F (36.6 C) (06/24 0700) Pulse Rate:  [26-115] 103 (06/24 0600) Resp:  [12-29] 18 (06/24 0600) BP: (89-138)/(28-110) 113/71 (06/24 0600) SpO2:  [63 %-100 %] 100 % (06/24 0600) Last BM Date: 06/25/20  Weight change: Filed Weights   06/25/20 0500 06/26/20 0800 06/26/20 0915  Weight: 102.6 kg 98.2 kg 98.2 kg    Intake/Output:   Intake/Output Summary (Last 24 hours) at 06/27/2020 0956 Last data filed at 06/27/2020 0800 Gross per 24 hour  Intake 1558.79 ml  Output 4265 ml  Net -2706.21 ml      Physical Exam   CVP 7-8 General:  Sitting in chair No resp difficulty HEENT: normal Neck: supple. no JVD. Carotids 2+ bilat; no bruits. No lymphadenopathy or thryomegaly appreciated. Cor: PMI nondisplaced. Irregular rate & rhythm. Lungs: clear Abdomen: soft, nontender, nondistended. No hepatosplenomegaly. No bruits or masses. Good bowel sounds. Extremities: no cyanosis, clubbing, rash, edema + TED hose Neuro: alert & orientedx3, cranial nerves grossly intact. moves all 4 extremities w/o difficulty. Affect pleasant GU : Foley Skin:  Petechiae right and left fingers and R toes    Telemetry    A fib 90s  Personally reviewed   Labs    CBC Recent Labs    06/26/20 0514 06/27/20 0511  WBC 12.7* 11.9*  HGB 8.7* 8.8*  HCT 29.0* 30.3*  MCV 77.7* 78.5*  PLT 267 932    Basic Metabolic Panel Recent Labs    06/26/20 0514 06/27/20 0511  NA 126* 133*  K 3.6 3.0*  CL 90* 104  CO2 27 22  GLUCOSE 391* 212*  BUN 13 10  CREATININE 0.95 0.73  CALCIUM 7.2* 5.7*  MG 1.8 1.4*    Liver Function Tests No results for input(s): AST, ALT, ALKPHOS, BILITOT, PROT, ALBUMIN in the last 72 hours.  No results for input(s): LIPASE, AMYLASE in the last 72 hours. Cardiac Enzymes No results for input(s): CKTOTAL, CKMB, CKMBINDEX, TROPONINI in the last 72 hours.  BNP: BNP (last 3 results) Recent Labs    05/26/20 1429 06/15/20 0729  BNP 1,609.8* 2,424.4*     ProBNP (last 3 results) Recent Labs    07/11/19 1107 04/23/20 1446  PROBNP 445 2,488*      D-Dimer No results for input(s): DDIMER in the last 72 hours. Hemoglobin A1C No results for input(s): HGBA1C in the last 72 hours. Fasting Lipid Panel No results for input(s): CHOL, HDL, LDLCALC, TRIG, CHOLHDL, LDLDIRECT in the last 72 hours. Thyroid Function Tests No results for input(s): TSH, T4TOTAL, T3FREE, THYROIDAB in  the last 72 hours.  Invalid input(s): FREET3   Other results:   Imaging    No results found.   Medications:     Scheduled Medications:  apixaban  5 mg Oral BID   atorvastatin  40 mg Oral Daily   Chlorhexidine Gluconate Cloth  6 each Topical Q0600   digoxin  0.125 mg Oral Daily   docusate sodium  200 mg Oral BID   insulin aspart  0-15 Units Subcutaneous TID WC   insulin aspart  0-5 Units Subcutaneous QHS   insulin glargine  10 Units Subcutaneous QHS   mupirocin ointment   Topical Q12H   potassium chloride SA  40 mEq Oral Q4H   senna  2 tablet Oral Daily   sodium chloride flush  10-40 mL Intracatheter Q12H   tamsulosin  0.4  mg Oral QPC supper   vitamin B-12  1,000 mcg Oral QHS    Infusions:  sodium chloride Stopped (06/23/20 0817)   sodium chloride 10 mL/hr at 06/27/20 0800   amiodarone 60 mg/hr (06/27/20 0800)   magnesium sulfate bolus IVPB 4 g (06/27/20 0817)   milrinone 0.375 mcg/kg/min (06/27/20 0800)   norepinephrine (LEVOPHED) Adult infusion 3 mcg/min (06/27/20 0800)    PRN Medications:   Assessment/Plan    Acute systolic HF -> 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 Lactate 9.9  - doubt ischemic as hstrop low despite profound hemodynamic support but given h/p CAD and multiple CRFs will need ischemic eval once more stable and renal function improves - Co-ox remains low despite Milrinone 0.375 and NE 3 however he continues to improve from symptomatic standpoint.  - Will try to drop milrinone to 0.25 and NE to 2 and follow co-ox - If co-ox not improving may need swan for more accurate assessment as it does not match with clinic scenario - Continue digoxin  - Off GDMT due to need for inotropes  2. CAD - s/p previous LAD stenting - doubt ischemia is major factor in HF given low hsTrop - continue ASA - Continue lipitor 40 mg daily.  - No CP   3. AF with RVR - Eliquis stopped 6/12 and switched to heparin.  - TEE/DCCV on 6/17-> NSR then went back to AF - Remains in A fib today was in/out yesterday - Continue IV amio  - EP consulted. Not currently a candidate for ablation or CRT-P with wounds.   4.  AKI - due to ATN and shock - peak creatine 2.9 - Resolved.   5. Shock liver - resolved   6. DM2 - SSI    7. Hypokalemia  -Supp K  - will supp  8. Sepsis/GN Bacteremia  - PCT 2.7. CXR w/o infiltrate. Foley replaced . BCx Proteus Species.  - Abx narrowed from cefepime to ceftriaxone per ID. Has completed 7  days - sepsis resolved  9. Urinary retention - failed Foley removal - continue Flomax  - will need repeat voiding trial soon   10. Wound on left foot - deep but clean based - WC recs bid dressing changes   11. Hyponatremia - Na 124-> 129 ->127 ->128 -> 123->126-> 133 - 6/18 and 6/22 tolvaptan, mentating ok   12. Petechial rash on R hand/L hand /R foot  - ? Cholesterol emboli. - neurovascular intact. No wound - follow  13. Code status - Full Code - Palliative following for goals of care.   CRITICAL CARE Performed by: Glori Bickers  Total critical care time: 35 minutes  Critical care time was exclusive of separately billable procedures and treating other patients.  Critical care was necessary to treat or prevent imminent or life-threatening deterioration.  Critical care was time spent personally by me (independent of midlevel providers or residents) on the following activities: development of treatment plan with patient and/or surrogate as well as nursing, discussions with consultants, evaluation of patient's response to treatment, examination of patient, obtaining history from patient or surrogate, ordering and performing treatments and interventions, ordering and review of laboratory studies, ordering and review of radiographic studies, pulse oximetry and re-evaluation of patient's condition.   Length of Stay: [place], MD  06/27/2020, 9:56 AM  Advanced Heart Failure Team Pager [phone] (M-F; 7a - 5p)  Please contact [facility] for night-coverage after hours (5p -7a ) and weekends on [email or web address]

## 2020-06-27 NOTE — Progress Notes (Signed)
[facility] Progress Note  Patient: Wesley Winters    HBZ:[id number]  DOA: 06/14/2020     Date of Service: the patient was seen and examined on 06/27/2020  Brief hospital course: Patient is 80 year old male with history of severe systolic congestive heart failure with ejection fraction 25% suspected to be from tachycardia induced, A. fib, coronary artery disease status post stenting, diabetes type 2 who initially presented to the emergency room with complaints of poor appetite, weakness, fall.  On presentation he was hypotensive, found to have intermittent episodes of A. fib with RVR.  Lab work showed worsening kidney function with creatinine in the range of 2, liver liver enzymes.  Patient was admitted for the management of cardiogenic shock.  Cardiology will primary team 06/24/2020, transferred to [facility] service on 06/25/2020.  On milrinone drip  Currently plan is transferring the patient to the ICU under cardiology service to initiate on Levophed drip.  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 Echo done on 5/22 showed ejection fraction of 20 to 25%, he was found to have elevated lactate level, hypotensive on presentation.  Currently on milrinone.  Also on digoxin.  Continue Lasix. Started on Levophed drip and will be transferred to the ICU under cardiology service. Management per advanced heart failure service   Paroxysmal A. fib with RVR:  S/p TEE/DCCV on 6/17, back to A. fib again.   Currently on IV amiodarone, on Eliquis for anticoagulation.   Monitor on telemetry.   Not recorded treated for repeat DCCV. Not a candidate for ablation   Coronary artery  disease:  history of LAD stenting.   Currently on aspirin.   Denies any chest pain.   Also on Lipitor.   AKI:  Likely secondary to cardiogenic shock, ATN.   AKI has resolved and kidney function at baseline.   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 Continue sliding scale insulin,lantus  Monitor blood sugars   Hypokalemia:  Being monitored and supplemented if necessary.   Sepsis due to Proteus mirabilis bacteremia secondary to UTI. Gram-negative bacteremia:  Febrile tachycardic and tachypneic on 6/15. Blood cultures showed Proteus species.   Likely source is urine but urine culture did not show any growth.  Procalcitonin was elevated.  ID was following.  Completed 7 days course of ceftriaxone. Sepsis physiology has resolved.  Leukocytosis improving.   Normocytic anemia:  Hemoglobin in the range of 8.   Currently stable.   Anemia is most likely secondary to chronic medical conditions.  No evidence of acute blood loss   Acute urine retention:  Failed voiding trial.  Continue Flomax.  Continue Foley catheter.  Needs urology follow-up as an outpatient   Left foot wound/pressure ulcers:  Wound care following.   Petechial rash on the hands and feet:  Unclear etiology.   Looks like an embolic physiology, we will continue monitoring. Platelets stable, kidney function stable.   Hyponatremia:  Secondary to severe congestive heart failure with volume overload.  Given few doses of tolvaptan.   Continue fluid restriction.   Goals of care:  Elderly patient with multiple comorbidities with severe congestive heart failure, poor prognosis.  Palliative care also consulted.  Remains full code.   Deconditioning/debility:  PT/OT recommended skilled nursing facility on discharge.  TOC consulted   Obesity:  Placing the patient  at high risk for poor outcome. Body mass index is 32.92 kg/m.   Pressure Injury 06/25/20 Buttocks Left Stage 2 -  Partial thickness loss of dermis presenting as a shallow open injury with a red, pink wound bed without slough. 2.7 cm  X 2.0 cm open stage 2, surrounding area is blanchable (Active)  06/25/20 0810  Location: Buttocks  Location Orientation: Left  Staging: Stage 2 -  Partial thickness loss of dermis presenting as a shallow open injury with a red, pink wound bed without slough.  Wound Description (Comments): 2.7 cm X 2.0 cm open stage 2, surrounding area is blanchable  Present on Admission: No     Pressure Injury 06/25/20 Buttocks Right Stage 2 -  Partial thickness loss of dermis presenting as a shallow open injury with a red, pink wound bed without slough. 2.3 cm X 1.0 cm stage 2, surrounding area is blanchable (Active)  06/25/20 0811  Location: Buttocks  Location Orientation: Right  Staging: Stage 2 -  Partial thickness loss of dermis presenting as a shallow open injury with a red, pink wound bed without slough.  Wound Description (Comments): 2.3 cm X 1.0 cm stage 2, surrounding area is blanchable  Present on Admission: No     Diet: Cardiac diet DVT Prophylaxis:   Place TED hose Start: 06/16/20 1758 apixaban (ELIQUIS) tablet 5 mg    Advance goals of care discussion: Full code  Family Communication: family was present at bedside, at the time of interview.  The pt provided permission to discuss medical plan with the family. Opportunity was given to ask question and all questions were answered satisfactorily.   Disposition:  Status is: Inpatient  Remains inpatient appropriate because:Inpatient level of care appropriate due to severity of illness  Dispo: The patient is from: Home              Anticipated d/c is to: Home              Patient currently is not medically stable to d/c.   Difficult to place patient No        Subjective: No nausea no vomiting.  No fever no chills.  No acute complaint.  Physical Exam:  General: Appear in mild distress, no Rash; Oral Mucosa Clear, moist. no Abnormal Neck Mass Or lumps, Conjunctiva normal  Cardiovascular: S1 and S2 Present, no Murmur, Respiratory:  good respiratory effort, Bilateral Air entry present and CTA, no Crackles, no wheezes Abdomen: Bowel Sound present, Soft and no tenderness Extremities: no Pedal edema Neurology: alert and oriented to time, place, and person affect appropriate. no new focal deficit Gait not checked due to patient safety concerns   Vitals:   06/27/20 1730 06/27/20 1745 06/27/20 1800 06/27/20 1945  BP: 133/74 (!) 129/101 (!) 134/97   Pulse: 77 (!) 113 (!) 55   Resp: (!) 30 20 (!) 25   Temp:    98.9 F (37.2 C)  TempSrc:    Oral  SpO2: 100% 100% 96%   Weight:      Height:        Intake/Output Summary (Last 24 hours) at 06/27/2020 2026 Last data filed at 06/27/2020 1600 Gross per 24 hour  Intake 1220.76 ml  Output 865 ml  Net 355.76 ml    Filed Weights   06/25/20 0500 06/26/20 0800 06/26/20 0915  Weight: 102.6 kg 98.2 kg 98.2 kg    Data Reviewed: I have personally reviewed and interpreted daily labs, tele strips, imaging. I reviewed all nursing  notes, pharmacy notes, vitals, pertinent old records I have discussed plan of care as described above with RN and patient/family.  CBC: Recent Labs  Lab 06/23/20 0204 06/24/20 0436 06/25/20 0240 06/26/20 0514 06/27/20 0511  WBC 12.4* 13.9* 12.9* 12.7* 11.9*  HGB 9.1* 9.1* 8.7* 8.7* 8.8*  HCT 29.5* 30.7* 28.8* 29.0* 30.3*  MCV 77.0* 78.1* 76.4* 77.7* 78.5*  PLT 230 259 269 267 810    Basic Metabolic Panel: Recent Labs  Lab 06/23/20 0204 06/24/20 0436 06/25/20 0240 06/25/20 1754 06/25/20 2343 06/26/20 0514 06/27/20 0511  NA 127* 128* 123* 125* 130* 126* 133*  K 3.9 4.1 3.9  --   --  3.6 3.0*  CL 91* 88* 86*  --   --  90* 104  CO2 28 25 27   --   --  27 22  GLUCOSE 263* 233* 299*  --   --  391* 212*  BUN 21 19 16   --   --  13 10  CREATININE 1.03 0.99 1.05  --   --  0.95 0.73  CALCIUM 7.4* 7.6* 7.6*  --   --  7.2* 5.7*  MG 1.8 1.7 1.9  --   --  1.8 1.4*     Studies: No results found.  Scheduled Meds:  apixaban  5 mg Oral BID    atorvastatin  40 mg Oral Daily   Chlorhexidine Gluconate Cloth  6 each Topical Q0600   digoxin  0.125 mg Oral Daily   docusate sodium  200 mg Oral BID   furosemide  40 mg Oral Daily   insulin aspart  0-15 Units Subcutaneous TID WC   insulin aspart  0-5 Units Subcutaneous QHS   insulin glargine  10 Units Subcutaneous QHS   mupirocin ointment   Topical Q12H   senna  2 tablet Oral Daily   sodium chloride flush  10-40 mL Intracatheter Q12H   tamsulosin  0.4 mg Oral QPC supper   vitamin B-12  1,000 mcg Oral QHS   Continuous Infusions:  sodium chloride Stopped (06/23/20 0817)   sodium chloride 10 mL/hr at 06/27/20 1400   amiodarone 60 mg/hr (06/27/20 1500)   milrinone 0.375 mcg/kg/min (06/27/20 1713)   norepinephrine (LEVOPHED) Adult infusion 3 mcg/min (06/27/20 1505)   PRN Meds: sodium chloride, acetaminophen, liver oil-zinc oxide, ondansetron (ZOFRAN) IV, sodium chloride flush  Time spent: 35 minutes  Author: Berle Mull, MD [facility] 06/27/2020 8:26 PM  To reach On-call, see care teams to locate the attending and reach out via [email or web address]. Between 7PM-7AM, please contact night-coverage If you still have difficulty reaching the attending provider, please page the [facility] (Director on Call) for [facility] on amion for assistance.

## 2020-06-27 NOTE — Progress Notes (Signed)
Physical Therapy Treatment Patient Details Name: Wesley Winters MRN: [id number] DOB: 1940-04-30 Today's Date: 06/27/2020    History of Present Illness 79 yo admitted 6/11 after 2 falls at home with fatigue and weakness with ARF. PMHx; HF, cardiomyopathy with EF 25%, Afib, CAD, DM, HTN, recent admit 5/23-5/31    PT Comments    Pt pleasant with improved cognition, balance and gait tolerance. Pt continues to need cues and assist to mobilize and progress activity with encouragement to maximize gait distance.   HR 109-120 Spo2 100% on RA Pre gait 109/67 (79) Post gait 122/73 (87)    Follow Up Recommendations  SNF;Supervision for mobility/OOB     Equipment Recommendations  Rolling walker with 5" wheels;3in1 (PT)    Recommendations for Other Services       Precautions / Restrictions Precautions Precautions: Fall Restrictions Weight Bearing Restrictions: No    Mobility  Bed Mobility   Bed Mobility: Supine to Sit     Supine to sit: Min guard     General bed mobility comments: guarding with HOB 10 degrees and cues for sequence and use of rail with increased time    Transfers Overall transfer level: Needs assistance   Transfers: Sit to/from Stand Sit to Stand: Min assist         General transfer comment: min assist to rise from bed and recliner with cues for hand placement  Ambulation/Gait Ambulation/Gait assistance: Min guard;+2 safety/equipment Gait Distance (Feet): 120 Feet Assistive device: Rolling walker (2 wheeled) Gait Pattern/deviations: Step-through pattern;Decreased stride length;Trunk flexed   Gait velocity interpretation: 1.31 - 2.62 ft/sec, indicative of limited community ambulator General Gait Details: cues for posture, position in RW and chair follow. Pt walked 80', 120' with chair follow and seated rest between trials. Pt with increased speed and stability with RW today   Stairs             Wheelchair Mobility    Modified Rankin (Stroke  Patients Only)       Balance Overall balance assessment: Needs assistance Sitting-balance support: No upper extremity supported;Feet supported Sitting balance-Leahy Scale: Good Sitting balance - Comments: EOB without support   Standing balance support: Bilateral upper extremity supported   Standing balance comment: RW for standing and gait                            Cognition Arousal/Alertness: Awake/alert Behavior During Therapy: WFL for tasks assessed/performed Overall Cognitive Status: Within Functional Limits for tasks assessed                                        Exercises General Exercises - Lower Extremity Long Arc Quad: AROM;Both;Seated;20 reps Hip ABduction/ADduction: AROM;Both;Seated;20 reps Hip Flexion/Marching: AROM;Both;Seated;20 reps    General Comments        Pertinent Vitals/Pain Pain Assessment: No/denies pain    Home Living                      Prior Function            PT Goals (current goals can now be found in the care plan section) Progress towards PT goals: Progressing toward goals    Frequency    Min 3X/week      PT Plan Current plan remains appropriate    Co-evaluation  AM-PAC PT "6 Clicks" Mobility   Outcome Measure  Help needed turning from your back to your side while in a flat bed without using bedrails?: A Little Help needed moving from lying on your back to sitting on the side of a flat bed without using bedrails?: A Little Help needed moving to and from a bed to a chair (including a wheelchair)?: A Little Help needed standing up from a chair using your arms (e.g., wheelchair or bedside chair)?: A Little Help needed to walk in hospital room?: A Little Help needed climbing 3-5 steps with a railing? : A Lot 6 Click Score: 17    End of Session Equipment Utilized During Treatment: Gait belt Activity Tolerance: Patient tolerated treatment well Patient left: in  chair;with call bell/phone within reach;with chair alarm set Nurse Communication: Mobility status PT Visit Diagnosis: Other abnormalities of gait and mobility (R26.89);Difficulty in walking, not elsewhere classified (R26.2)     Time: 5750-5183 PT Time Calculation (min) (ACUTE ONLY): 19 min  Charges:  $Gait Training: 8-22 mins                     Bayard Males, PT Acute Rehabilitation Services Pager: [phone] Office: [place] 06/27/2020, 11:38 AM

## 2020-06-27 NOTE — Plan of Care (Signed)

## 2020-06-27 NOTE — Progress Notes (Signed)
Patient on CPAP at this time.

## 2020-06-28 LAB — BASIC METABOLIC PANEL
Anion gap: 8 (ref 5–15)
BUN: 12 mg/dL (ref 8–23)
CO2: 27 mmol/L (ref 22–32)
Calcium: 7.4 mg/dL — ABNORMAL LOW (ref 8.9–10.3)
Chloride: 91 mmol/L — ABNORMAL LOW (ref 98–111)
Creatinine, Ser: 1.03 mg/dL (ref 0.61–1.24)
GFR, Estimated: >60 mL/min (ref >60)
Glucose, Bld: 232 mg/dL — ABNORMAL HIGH (ref 70–99)
Potassium: 4.4 mmol/L (ref 3.5–5.1)
Sodium: 126 mmol/L — ABNORMAL LOW (ref 135–145)

## 2020-06-28 LAB — COOXEMETRY PANEL
Carboxyhemoglobin: 1.2 % (ref 0.5–1.5)
Methemoglobin: 0.7 % (ref 0.0–1.5)
O2 Saturation: 55.2 %
Total hemoglobin: 8.8 g/dL — ABNORMAL LOW (ref 12.0–16.0)

## 2020-06-28 LAB — CBC
HCT: 28.8 % — ABNORMAL LOW (ref 39.0–52.0)
Hemoglobin: 8.6 g/dL — ABNORMAL LOW (ref 13.0–17.0)
MCH: 23.3 pg — ABNORMAL LOW (ref 26.0–34.0)
MCHC: 29.9 g/dL — ABNORMAL LOW (ref 30.0–36.0)
MCV: 78 fL — ABNORMAL LOW (ref 80.0–100.0)
Platelets: 258 10*3/uL (ref 150–400)
RBC: 3.69 MIL/uL — ABNORMAL LOW (ref 4.22–5.81)
RDW: 20.5 % — ABNORMAL HIGH (ref 11.5–15.5)
WBC: 10.4 10*3/uL (ref 4.0–10.5)
nRBC: 0 % (ref 0.0–0.2)

## 2020-06-28 LAB — GLUCOSE, CAPILLARY
Glucose-Capillary: 120 mg/dL — ABNORMAL HIGH (ref 70–99)
Glucose-Capillary: 161 mg/dL — ABNORMAL HIGH (ref 70–99)
Glucose-Capillary: 192 mg/dL — ABNORMAL HIGH (ref 70–99)
Glucose-Capillary: 173 mg/dL — ABNORMAL HIGH (ref 70–99)

## 2020-06-28 LAB — MAGNESIUM: Magnesium: 2.1 mg/dL (ref 1.7–2.4)

## 2020-06-28 MED ORDER — TORSEMIDE 20 MG PO TABS: 40.0000 mg | ORAL_TABLET | Freq: Every day | ORAL | Status: DC

## 2020-06-28 MED ORDER — SPIRONOLACTONE 12.5 MG HALF TABLET
12.5000 mg | ORAL_TABLET | Freq: Every day | ORAL | Status: DC
  Administered 2020-06-28 – 2020-07-05 (×8): 12.5 mg via ORAL
  Filled 2020-06-28 (×8): qty 1

## 2020-06-28 MED ORDER — TORSEMIDE 20 MG PO TABS
40.0000 mg | ORAL_TABLET | Freq: Every day | ORAL | Status: DC
  Administered 2020-06-28 – 2020-06-29 (×2): 40 mg via ORAL
  Filled 2020-06-28 (×2): qty 2

## 2020-06-28 NOTE — Plan of Care (Signed)
  Problem: Education: Goal: Knowledge of General Education information will improve Description: Including pain rating scale, medication(s)/side effects and non-pharmacologic comfort measures Outcome: Progressing   Problem: Health Behavior/Discharge Planning: Goal: Ability to manage health-related needs will improve Outcome: Not Progressing   Problem: Clinical Measurements: Goal: Will remain free from infection Outcome: Progressing   Problem: Activity: Goal: Risk for activity intolerance will decrease Outcome: Progressing   Problem: Nutrition: Goal: Adequate nutrition will be maintained Outcome: Progressing   Problem: Elimination: Goal: Will not experience complications related to bowel motility Outcome: Progressing Goal: Will not experience complications related to urinary retention Outcome: Progressing

## 2020-06-28 NOTE — Progress Notes (Signed)
Occupational Therapy Treatment Patient Details Name: Wesley Winters MRN: [id number] DOB: [birth year] Today's Date: 06/28/2020    History of present illness 80 yo admitted 6/11 after 2 falls at home with fatigue and weakness with ARF. PMHx; HF, cardiomyopathy with EF 25%, Afib, CAD, DM, HTN, recent admit 5/23-5/31   OT comments  Patient with good progress toward patient focused OT goals.  Up in the recliner this date, MD in the room.  Patient needing setup only for seated grooming, and able to mobilize in the room with supervision to min guard and a RW.  Patient continues to complain of lower extremity weakness, and remaining barriers listed below.  Patient is needing moderate 24 hour care, and spouse is not able to provide this level currently.  SNF is recommended for post acute rehab prior to returning home.  Acute OT is recommended to maximize his functional status.  Patient was able to walk with staff earlier this morning.    Follow Up Recommendations  SNF;Supervision/Assistance - 24 hour    Equipment Recommendations  3 in 1 bedside commode;Other (comment)    Recommendations for Other Services      Precautions / Restrictions Precautions Precautions: Fall Restrictions Weight Bearing Restrictions: No       Mobility Bed Mobility                 Patient Response: Cooperative  Transfers Overall transfer level: Needs assistance Equipment used: Rolling walker (2 wheeled) Transfers: Sit to/from Stand Sit to Stand: Min guard Stand pivot transfers: Min guard            Balance Overall balance assessment: Needs assistance Sitting-balance support: No upper extremity supported;Feet supported Sitting balance-Leahy Scale: Good     Standing balance support: Bilateral upper extremity supported Standing balance-Leahy Scale: Poor Standing balance comment: RW for support                           ADL either performed or assessed with clinical judgement   ADL  Overall ADL's : Needs assistance/impaired     Grooming: Sitting;Wash/dry hands;Set up               Lower Body Dressing: Maximal assistance;Sit to/from stand               Functional mobility during ADLs: Min guard       Vision Patient Visual Report: No change from baseline     Perception     Praxis      Cognition Arousal/Alertness: Awake/alert Behavior During Therapy: WFL for tasks assessed/performed Overall Cognitive Status: Within Functional Limits for tasks assessed                                                            Pertinent Vitals/ Pain       Pain Assessment: No/denies pain                                                          Frequency  Min 2X/week        Progress Toward Goals  OT Goals(current  goals can now be found in the care plan section)  Progress towards OT goals: Progressing toward goals  Acute Rehab OT Goals Patient Stated Goal: I need to get a little stronger OT Goal Formulation: With patient Time For Goal Achievement: 07/04/20 Potential to Achieve Goals: Good  Plan Discharge plan remains appropriate;Frequency remains appropriate    Co-evaluation                 AM-PAC OT "6 Clicks" Daily Activity     Outcome Measure   Help from another person eating meals?: None Help from another person taking care of personal grooming?: None Help from another person toileting, which includes using toliet, bedpan, or urinal?: A Lot Help from another person bathing (including washing, rinsing, drying)?: A Lot Help from another person to put on and taking off regular upper body clothing?: A Little Help from another person to put on and taking off regular lower body clothing?: A Lot 6 Click Score: 17    End of Session Equipment Utilized During Treatment: Rolling walker  OT Visit Diagnosis: Unsteadiness on feet (R26.81);Other abnormalities of gait and mobility (R26.89);Muscle  weakness (generalized) (M62.81)   Activity Tolerance Patient tolerated treatment well   Patient Left in chair;with call bell/phone within reach;with family/visitor present   Nurse Communication          Time: 1749-4496 OT Time Calculation (min): 12 min  Charges: OT General Charges $OT Visit: 1 Visit OT Treatments $Self Care/Home Management : 8-22 mins  06/28/2020  Rich, OTR/L  Acute Rehabilitation Services  Office:  [phone]    Metta Clines 06/28/2020, 11:40 AM

## 2020-06-28 NOTE — Progress Notes (Signed)
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 fib, coronary artery disease status post stenting, diabetes type 2 who initially presented to the emergency room with complaints of poor appetite, weakness, fall.  On presentation he was hypotensive, found to have intermittent episodes of A. fib with RVR.  Lab work showed worsening kidney function with creatinine in the range of 2, liver liver enzymes.  Patient was admitted for the management of cardiogenic shock.  Cardiology will primary team 06/24/2020, transferred to [facility] service on 06/25/2020.  On milrinone drip. 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 Echo done on 5/22 showed ejection fraction of 20 to 25%, he was found to have elevated lactate level, hypotensive on presentation.  Currently on milrinone and levophed.  Also on digoxin.   Diuresis with torsemide/aldactone Management per advanced heart failure service   Paroxysmal A. fib with RVR:  S/p TEE/DCCV on 6/17, back to A. fib again.   Currently on IV amiodarone, digoxin, on Eliquis for anticoagulation.   Monitor on telemetry.   Not a candidate for ablation   Coronary artery  disease:  history of LAD stenting.   Currently on aspirin.   Denies any chest pain.   Also on Lipitor.   AKI:  Likely secondary to cardiogenic shock, ATN.   AKI has resolved and renal function at baseline.   Elevated liver enzymes:  Most likely from shock liver from hypotension/Congestive hepatopathy Resolved.   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 Continue sliding scale insulin,lantus  Blood sugars currently stable   Hypokalemia:  Being monitored and supplemented if necessary.   Sepsis due to Proteus mirabilis bacteremia secondary to UTI. Gram-negative bacteremia:  Febrile tachycardic and tachypneic on 6/15. Blood cultures showed Proteus species.   Likely source is urine but urine culture did not show any growth.  Procalcitonin was elevated.  ID was following.  Completed 7 days course of ceftriaxone. Sepsis physiology has resolved.  Leukocytosis resolved   Normocytic anemia:  Hemoglobin in the range of 8.   Currently stable.   Anemia is most likely secondary to chronic medical conditions.  No evidence of acute blood loss   Acute urine retention:  Failed voiding trial.  Continue Flomax.  Continue Foley catheter.  Needs urology follow-up as an outpatient   Left foot wound/pressure ulcers:  Wound care following.   Petechial rash on the hands and feet:  Unclear etiology.   Looks like an embolic physiology, we will continue monitoring. Platelets stable, kidney function stable.   Hyponatremia:  Secondary to severe congestive heart failure with volume overload.  Given few doses of tolvaptan.   Continue fluid restriction. Continue to follow   Goals of care:  Elderly patient with multiple comorbidities with severe congestive heart failure, poor prognosis.  Palliative care also consulted.  Remains full code.   Deconditioning/debility:  PT/OT recommended skilled nursing facility on discharge.  TOC consulted   Obesity:  Placing the patient at high risk for poor outcome. Body mass index is 34.19 kg/m.  Pressure Injury 06/25/20 Buttocks Left Stage 2 -  Partial thickness loss of dermis presenting as a shallow open injury with a red, pink wound bed without slough. 2.7 cm X 2.0 cm open stage 2, surrounding area is blanchable (Active)  06/25/20  0810  Location: Buttocks  Location Orientation: Left  Staging: Stage 2 -  Partial thickness loss of dermis presenting as a shallow open injury with a red, pink wound bed without slough.  Wound Description (Comments): 2.7 cm X 2.0 cm open stage 2, surrounding area is blanchable  Present on Admission: No     Pressure Injury 06/25/20 Buttocks Right Stage 2 -  Partial thickness loss of dermis presenting as a shallow open injury with a red, pink wound bed without slough. 2.3 cm X 1.0 cm stage 2, surrounding area is blanchable (Active)  06/25/20 0811  Location: Buttocks  Location Orientation: Right  Staging: Stage 2 -  Partial thickness loss of dermis presenting as a shallow open injury with a red, pink wound bed without slough.  Wound Description (Comments): 2.3 cm X 1.0 cm stage 2, surrounding area is blanchable  Present on Admission: No     Diet: Cardiac diet DVT Prophylaxis:   Place TED hose Start: 06/16/20 1758 apixaban (ELIQUIS) tablet 5 mg    Advance goals of care discussion: Full code  Family Communication: no family present at bedside today  Disposition:  Status is: Inpatient  Remains inpatient appropriate because:Inpatient level of care appropriate due to severity of illness  Dispo: The patient is from: Home              Anticipated d/c is to: Home              Patient currently is not medically stable to d/c.   Difficult to place patient No        Subjective: denies any shortness of breath or chest pain  Physical Exam:  General exam: Alert, awake, oriented x 3, currently on CPAP Respiratory system: Clear to auscultation. Respiratory effort normal. Cardiovascular system:RRR. No murmurs, rubs, gallops. Gastrointestinal system: Abdomen is nondistended, soft and nontender. No organomegaly or masses felt. Normal bowel sounds heard. Central nervous system: Alert and oriented. No focal neurological deficits. Extremities: 2+ edema bilaterally Skin: No rashes, lesions or  ulcers Psychiatry: Judgement and insight appear normal. Mood & affect appropriate.    Vitals:   06/28/20 1200 06/28/20 1215 06/28/20 1523 06/28/20 1600  BP:  112/65  97/60  Pulse: (!) 106 (!) 114  85  Resp: 20 17  (!) 22  Temp:   97.8 F (36.6 C)   TempSrc:   Oral   SpO2: 96% 100%  100%  Weight:      Height:        Intake/Output Summary (Last 24 hours) at 06/28/2020 1903 Last data filed at 06/28/2020 1800 Gross per 24 hour  Intake 2410.37 ml  Output 2290 ml  Net 120.37 ml   Filed Weights   06/26/20 0800 06/26/20 0915 06/28/20 0600  Weight: 98.2 kg 98.2 kg 102 kg    Data Reviewed: I have personally reviewed and interpreted daily labs, tele strips, imaging. I reviewed all nursing notes, pharmacy notes, vitals, pertinent old records I have discussed plan of care as described above with RN and patient/family.  CBC: Recent Labs  Lab 06/24/20 0436 06/25/20 0240 06/26/20 0514 06/27/20 0511 06/28/20 0432  WBC 13.9* 12.9* 12.7* 11.9* 10.4  HGB 9.1* 8.7* 8.7* 8.8* 8.6*  HCT 30.7* 28.8* 29.0* 30.3*  28.8*  MCV 78.1* 76.4* 77.7* 78.5* 78.0*  PLT 259 269 267 289 655   Basic Metabolic Panel: Recent Labs  Lab 06/24/20 0436 06/25/20 0240 06/25/20 1754 06/25/20 2343 06/26/20 0514 06/27/20 0511 06/28/20 0432  NA 128* 123* 125* 130* 126* 133* 126*  K 4.1 3.9  --   --  3.6 3.0* 4.4  CL 88* 86*  --   --  90* 104 91*  CO2 25 27  --   --  27 22 27   GLUCOSE 233* 299*  --   --  391* 212* 232*  BUN 19 16  --   --  13 10 12   CREATININE 0.99 1.05  --   --  0.95 0.73 1.03  CALCIUM 7.6* 7.6*  --   --  7.2* 5.7* 7.4*  MG 1.7 1.9  --   --  1.8 1.4* 2.1    Studies: No results found.  Scheduled Meds:  apixaban  5 mg Oral BID   atorvastatin  40 mg Oral Daily   Chlorhexidine Gluconate Cloth  6 each Topical Q0600   digoxin  0.125 mg Oral Daily   docusate sodium  200 mg Oral BID   insulin aspart  0-15 Units Subcutaneous TID WC   insulin aspart  0-5 Units Subcutaneous QHS    insulin glargine  10 Units Subcutaneous QHS   mupirocin ointment   Topical Q12H   senna  2 tablet Oral Daily   sodium chloride flush  10-40 mL Intracatheter Q12H   spironolactone  12.5 mg Oral Daily   tamsulosin  0.4 mg Oral QPC supper   torsemide  40 mg Oral Daily   vitamin B-12  1,000 mcg Oral QHS   Continuous Infusions:  sodium chloride Stopped (06/23/20 0817)   sodium chloride 10 mL/hr at 06/28/20 1800   amiodarone 60 mg/hr (06/28/20 1800)   milrinone 0.375 mcg/kg/min (06/28/20 1800)   norepinephrine (LEVOPHED) Adult infusion 3 mcg/min (06/28/20 1800)   PRN Meds: sodium chloride, acetaminophen, liver oil-zinc oxide, ondansetron (ZOFRAN) IV, sodium chloride flush  Time spent: 35 minutes  Author: Kathie Dike MD [facility] 06/28/2020 7:03 PM  To reach On-call, see care teams to locate the attending and reach out via [email or web address]. Between 7PM-7AM, please contact night-coverage If you still have difficulty reaching the attending provider, please page the [facility] (Director on Call) for [facility] on amion for assistance.

## 2020-06-28 NOTE — Plan of Care (Signed)

## 2020-06-28 NOTE — Progress Notes (Signed)
Advanced Heart Failure Rounding Note  PCP-Cardiologist: Sinclair Grooms, MD   Subjective:   Cardiogenic shock. New onset AF.   6/12 CO-OX 43% --->Started on milrinone 0.25 mcg.  6/13 Milrinone increased to 0.375. Co-ox 58% today.  6/15 Back in Afib w/ RVR, amio rebolused, rate increased to 60  6/15 Fever spike + leukocytosis + hypotension>>started on empiric cefepime + NE. BCx with proteus 6/17 TEE/DC-CV briefly in SR 6/19 Back in AF. Milrinone cut back to 0.25 mcg.  6/20 Developed petechiae R fingers.  6/21  Milrinone increased to 0.375 and NE 3 added. 6/22 Sodium 123 Given tolvaptan.   On Milrinone 0.375 mcg + Norepi 3 mcg. We attempted to wean milrinone o 0.25 and NE to 2 yesterday and co-ox dropped 48%-> 33%. Doses now back up co-ox 55%  Feels ok denies CP or SOB.   Remains in AF this am rates 100-115  Objective:   Weight Range: 102 kg Body mass index is 34.19 kg/m.   Vital Signs:   Temp:  [97.3 F (36.3 C)-98.9 F (37.2 C)] 98 F (36.7 C) (06/25 0700) Pulse Rate:  [29-115] 91 (06/25 0600) Resp:  [15-49] 49 (06/25 0600) BP: (98-166)/(52-148) 132/72 (06/25 0600) SpO2:  [93 %-100 %] 100 % (06/25 0600) Weight:  [102 kg] 102 kg (06/25 0600) Last BM Date: 06/27/20  Weight change: Filed Weights   06/26/20 0800 06/26/20 0915 06/28/20 0600  Weight: 98.2 kg 98.2 kg 102 kg    Intake/Output:   Intake/Output Summary (Last 24 hours) at 06/28/2020 0845 Last data filed at 06/28/2020 0800 Gross per 24 hour  Intake 1409.55 ml  Output 1585 ml  Net -175.45 ml      Physical Exam   CVP 12 General:  Sitting in chair  No resp difficulty HEENT: normal Neck: supple. JVP to jaw Carotids 2+ bilat; no bruits. No lymphadenopathy or thryomegaly appreciated. Cor: PMI nondisplaced. Irreg tachy . No rubs, gallops or murmurs. Lungs: clear Abdomen: soft, nontender, nondistended. No hepatosplenomegaly. No bruits or masses. Good bowel sounds. Extremities: no cyanosis,  clubbing, rash, edema Neuro: alert & orientedx3, cranial nerves grossly intact. moves all 4 extremities w/o difficulty. Affect pleasant  cranial nerves grossly intact. moves all 4 extremities w/o difficulty. Affect pleasant GU : Foley Skin: Petechiae right and left fingers and R toes    Telemetry    A fib 90-110 Personally reviewed   Labs    CBC Recent Labs    06/27/20 0511 06/28/20 0432  WBC 11.9* 10.4  HGB 8.8* 8.6*  HCT 30.3* 28.8*  MCV 78.5* 78.0*  PLT 289 175    Basic Metabolic Panel Recent Labs    06/27/20 0511 06/28/20 0432  NA 133* 126*  K 3.0* 4.4  CL 104 91*  CO2 22 27  GLUCOSE 212* 232*  BUN 10 12  CREATININE 0.73 1.03  CALCIUM 5.7* 7.4*  MG 1.4* 2.1    Liver Function Tests No results for input(s): AST, ALT, ALKPHOS, BILITOT, PROT, ALBUMIN in the last 72 hours.  No results for input(s): LIPASE, AMYLASE in the last 72 hours. Cardiac Enzymes No results for input(s): CKTOTAL, CKMB, CKMBINDEX, TROPONINI in the last 72 hours.  BNP: BNP (last 3 results) Recent Labs    05/26/20 1429 06/15/20 0729  BNP 1,609.8* 2,424.4*     ProBNP (last 3 results) Recent Labs    07/11/19 1107 04/23/20 1446  PROBNP 445 2,488*      D-Dimer No results for input(s): DDIMER in the  last 72 hours. Hemoglobin A1C No results for input(s): HGBA1C in the last 72 hours. Fasting Lipid Panel No results for input(s): CHOL, HDL, LDLCALC, TRIG, CHOLHDL, LDLDIRECT in the last 72 hours. Thyroid Function Tests No results for input(s): TSH, T4TOTAL, T3FREE, THYROIDAB in the last 72 hours.  Invalid input(s): FREET3   Other results:   Imaging    No results found.   Medications:     Scheduled Medications:  apixaban  5 mg Oral BID   atorvastatin  40 mg Oral Daily   Chlorhexidine Gluconate Cloth  6 each Topical Q0600   digoxin  0.125 mg Oral Daily   docusate sodium  200 mg Oral BID   furosemide  40 mg Oral Daily   insulin aspart  0-15 Units Subcutaneous TID  WC   insulin aspart  0-5 Units Subcutaneous QHS   insulin glargine  10 Units Subcutaneous QHS   mupirocin ointment   Topical Q12H   senna  2 tablet Oral Daily   sodium chloride flush  10-40 mL Intracatheter Q12H   tamsulosin  0.4 mg Oral QPC supper   vitamin B-12  1,000 mcg Oral QHS    Infusions:  sodium chloride Stopped (06/23/20 0817)   sodium chloride 10 mL/hr at 06/28/20 0500   amiodarone 60 mg/hr (06/28/20 0500)   milrinone 0.375 mcg/kg/min (06/28/20 0500)   norepinephrine (LEVOPHED) Adult infusion 3 mcg/min (06/28/20 0500)    PRN Medications:   Assessment/Plan    Acute systolic HF -> 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 Lactate 9.9  - doubt ischemic as hstrop low despite profound hemodynamic support but given h/p CAD and multiple CRFs will need ischemic eval once more stable and renal function improves - Co-ox remains low despite Milrinone 0.375 and NE 3 however he continues to improve from symptomatic standpoint. Failed wean on 6/24 to milrinone to 0.25 and NE to 2 (co-ox dropped to 33% though clinically unchanged) - If co-ox not improving may need R/L cath with swan for more accurate assessment as it does not match with clinic scenario - Continue digoxin  - will add spiro 12.5 - volume up. Change to torsemide 40 daily. - Off GDMT due to need for inotropes  2. CAD - s/p previous LAD stenting last cath 2005 with patent coronaries - doubt ischemia is major factor in HF given low hsTrop however with inability to wean inotropes will consider R/L cath early next week - continue ASA - Continue lipitor 40 mg daily.    3. AF with RVR - Eliquis stopped 6/12 and switched to heparin.  - TEE/DCCV on 6/17-> NSR then went back to AF - Remains in A fib today was in/out earlier int he week  - Continue  IV amio  - EP consulted. Not currently a candidate for ablation or CRT-P with wounds.   4.  AKI - due to ATN and shock - peak creatine 2.9 - Resolved.   5. Shock liver - resolved   6. DM2 - SSI    7. Hypokalemia  - K 4.4  8. Sepsis/GN Bacteremia  - PCT 2.7. CXR w/o infiltrate. Foley replaced . BCx Proteus Species.  - Abx narrowed from cefepime to ceftriaxone per ID. Has completed 7 days - sepsis resolved  9. Urinary retention - failed Foley removal - continue Flomax  -  will need repeat voiding trial soon   10. Wound on left foot - deep but clean based - WC recs bid dressing changes   11. Hyponatremia - Na 124-> 129 ->127 ->128 -> 123->126-> 133 -> 126 - 6/18 and 6/22 tolvaptan, mentating ok   12. Petechial rash on R hand/L hand /R foot  - ? Cholesterol emboli. - neurovascular intact. No wound - follow   13. Code status - Full Code - Palliative following for goals of care.   CRITICAL CARE Performed by: Glori Bickers  Total critical care time: 35 minutes  Critical care time was exclusive of separately billable procedures and treating other patients.  Critical care was necessary to treat or prevent imminent or life-threatening deterioration.  Critical care was time spent personally by me (independent of midlevel providers or residents) on the following activities: development of treatment plan with patient and/or surrogate as well as nursing, discussions with consultants, evaluation of patient's response to treatment, examination of patient, obtaining history from patient or surrogate, ordering and performing treatments and interventions, ordering and review of laboratory studies, ordering and review of radiographic studies, pulse oximetry and re-evaluation of patient's condition.   Length of Stay: [place], MD  06/28/2020, 8:45 AM  Advanced Heart Failure Team Pager [phone] (M-F; 7a - 5p)  Please contact [facility] for night-coverage  after hours (5p -7a ) and weekends on [email or web address]

## 2020-06-29 LAB — BASIC METABOLIC PANEL
Anion gap: 10 (ref 5–15)
BUN: 11 mg/dL (ref 8–23)
CO2: 28 mmol/L (ref 22–32)
Calcium: 7.1 mg/dL — ABNORMAL LOW (ref 8.9–10.3)
Chloride: 88 mmol/L — ABNORMAL LOW (ref 98–111)
Creatinine, Ser: 1.11 mg/dL (ref 0.61–1.24)
GFR, Estimated: >60 mL/min (ref >60)
Glucose, Bld: 288 mg/dL — ABNORMAL HIGH (ref 70–99)
Potassium: 3.2 mmol/L — ABNORMAL LOW (ref 3.5–5.1)
Sodium: 126 mmol/L — ABNORMAL LOW (ref 135–145)

## 2020-06-29 LAB — COOXEMETRY PANEL
Carboxyhemoglobin: 1.4 % (ref 0.5–1.5)
Methemoglobin: 0.6 % (ref 0.0–1.5)
O2 Saturation: 64.9 %
Total hemoglobin: 8.5 g/dL — ABNORMAL LOW (ref 12.0–16.0)

## 2020-06-29 LAB — MAGNESIUM
Magnesium: 1.5 mg/dL — ABNORMAL LOW (ref 1.7–2.4)
Magnesium: 3.9 mg/dL — ABNORMAL HIGH (ref 1.7–2.4)

## 2020-06-29 LAB — POTASSIUM: Potassium: 3.6 mmol/L (ref 3.5–5.1)

## 2020-06-29 LAB — DIGOXIN LEVEL: Digoxin Level: 0.6 ng/mL — ABNORMAL LOW (ref 0.8–2.0)

## 2020-06-29 LAB — GLUCOSE, CAPILLARY: Glucose-Capillary: 155 mg/dL — ABNORMAL HIGH (ref 70–99)

## 2020-06-29 MED ORDER — POTASSIUM CHLORIDE CRYS ER 20 MEQ PO TBCR
40.0000 meq | EXTENDED_RELEASE_TABLET | ORAL | Status: AC
  Administered 2020-06-29 (×2): 40 meq via ORAL
  Filled 2020-06-29 (×2): qty 2

## 2020-06-29 MED ORDER — DIGOXIN 125 MCG PO TABS
0.0625 mg | ORAL_TABLET | Freq: Every day | ORAL | Status: DC
  Administered 2020-06-30 – 2020-07-07 (×8): 0.0625 mg via ORAL
  Administered 2020-07-08: 0.125 mg via ORAL
  Administered 2020-07-09: 0.0625 mg via ORAL
  Filled 2020-06-29 (×10): qty 1

## 2020-06-29 MED ORDER — MAGNESIUM SULFATE 4 GM/100ML IV SOLN
4.0000 g | Freq: Once | INTRAVENOUS | Status: AC
  Administered 2020-06-29: 4 g via INTRAVENOUS
  Filled 2020-06-29: qty 100

## 2020-06-29 MED ORDER — SENNA 8.6 MG PO TABS
1.0000 | ORAL_TABLET | Freq: Every day | ORAL | Status: DC
  Administered 2020-07-03 – 2020-07-07 (×5): 8.6 mg via ORAL
  Filled 2020-06-29 (×8): qty 1

## 2020-06-29 MED ORDER — DOCUSATE SODIUM 100 MG PO CAPS
100.0000 mg | ORAL_CAPSULE | Freq: Every day | ORAL | Status: DC
  Administered 2020-07-03 – 2020-07-07 (×5): 100 mg via ORAL
  Filled 2020-06-29 (×8): qty 1

## 2020-06-29 MED ORDER — RANOLAZINE ER 500 MG PO TB12
500.0000 mg | ORAL_TABLET | Freq: Two times a day (BID) | ORAL | Status: DC
  Administered 2020-06-29 – 2020-07-09 (×22): 500 mg via ORAL
  Filled 2020-06-29 (×22): qty 1

## 2020-06-29 NOTE — Progress Notes (Addendum)
Patient on CPAP at this time.

## 2020-06-29 NOTE — Progress Notes (Signed)
Advanced Heart Failure Rounding Note  PCP-Cardiologist: Sinclair Grooms, MD   Subjective:   Cardiogenic shock. New onset AF.   6/12 CO-OX 43% --->Started on milrinone 0.25 mcg.  6/13 Milrinone increased to 0.375. Co-ox 58% today.  6/15 Back in Afib w/ RVR, amio rebolused, rate increased to 60  6/15 Fever spike + leukocytosis + hypotension>>started on empiric cefepime + NE. BCx with proteus 6/17 TEE/DC-CV briefly in SR 6/19 Back in AF. Milrinone cut back to 0.25 mcg.  6/20 Developed petechiae R fingers.  6/21  Milrinone increased to 0.375 and NE 3 added. 6/24 Attempted to wean milrinone to 0.25 and NE to 2 -> co-ox dropped 48%-> 33%  On Milrinone 0.375 mcg + NE at 3. Back in SR with PACs. Co-ox 64% this am. CVP 8 on po torsemide  Feels ok. Denies CP or SOB. Still with Foley in place  Objective:   Weight Range: 99.5 kg Body mass index is 33.35 kg/m.   Vital Signs:   Temp:  [97.8 F (36.6 C)-99.1 F (37.3 C)] 97.9 F (36.6 C) (06/26 0731) Pulse Rate:  [50-116] 69 (06/26 0300) Resp:  [14-41] 41 (06/26 0300) BP: (73-157)/(34-137) 106/57 (06/26 0300) SpO2:  [80 %-100 %] 100 % (06/26 0300) Weight:  [99.5 kg] 99.5 kg (06/26 0446) Last BM Date: 06/28/20  Weight change: Filed Weights   06/26/20 0915 06/28/20 0600 06/29/20 0446  Weight: 98.2 kg 102 kg 99.5 kg    Intake/Output:   Intake/Output Summary (Last 24 hours) at 06/29/2020 0811 Last data filed at 06/29/2020 0400 Gross per 24 hour  Intake 1633.38 ml  Output 2985 ml  Net -1351.62 ml      Physical Exam   General:  Sitting up in bed. No resp difficulty HEENT: normal Neck: supple. no JVD. Carotids 2+ bilat; no bruits. No lymphadenopathy or thryomegaly appreciated. Cor: PMI nondisplaced. Mildly irregular rate & rhythm. No rubs, gallops or murmurs. Lungs: clear Abdomen: soft, nontender, nondistended. No hepatosplenomegaly. No bruits or masses. Good bowel sounds. Extremities: no cyanosis, clubbing, rash, tr  edema + TED hose Neuro: alert & orientedx3, cranial nerves grossly intact. moves all 4 extremities w/o difficulty. Affect pleasant GU : Foley Skin: Petechiae right and left fingers and R toes    Telemetry    Sinus with PAC 60-70s Personally reviewed   Labs    CBC Recent Labs    06/27/20 0511 06/28/20 0432  WBC 11.9* 10.4  HGB 8.8* 8.6*  HCT 30.3* 28.8*  MCV 78.5* 78.0*  PLT 289 109    Basic Metabolic Panel Recent Labs    06/28/20 0432 06/29/20 0422  NA 126* 126*  K 4.4 3.2*  CL 91* 88*  CO2 27 28  GLUCOSE 232* 288*  BUN 12 11  CREATININE 1.03 1.11  CALCIUM 7.4* 7.1*  MG 2.1 1.5*    Liver Function Tests No results for input(s): AST, ALT, ALKPHOS, BILITOT, PROT, ALBUMIN in the last 72 hours.  No results for input(s): LIPASE, AMYLASE in the last 72 hours. Cardiac Enzymes No results for input(s): CKTOTAL, CKMB, CKMBINDEX, TROPONINI in the last 72 hours.  BNP: BNP (last 3 results) Recent Labs    05/26/20 1429 06/15/20 0729  BNP 1,609.8* 2,424.4*     ProBNP (last 3 results) Recent Labs    07/11/19 1107 04/23/20 1446  PROBNP 445 2,488*      D-Dimer No results for input(s): DDIMER in the last 72 hours. Hemoglobin A1C No results for input(s): HGBA1C in the  last 72 hours. Fasting Lipid Panel No results for input(s): CHOL, HDL, LDLCALC, TRIG, CHOLHDL, LDLDIRECT in the last 72 hours. Thyroid Function Tests No results for input(s): TSH, T4TOTAL, T3FREE, THYROIDAB in the last 72 hours.  Invalid input(s): FREET3   Other results:   Imaging    No results found.   Medications:     Scheduled Medications:  apixaban  5 mg Oral BID   atorvastatin  40 mg Oral Daily   Chlorhexidine Gluconate Cloth  6 each Topical Q0600   digoxin  0.125 mg Oral Daily   docusate sodium  200 mg Oral BID   insulin aspart  0-15 Units Subcutaneous TID WC   insulin aspart  0-5 Units Subcutaneous QHS   insulin glargine  10 Units Subcutaneous QHS   mupirocin  ointment   Topical Q12H   potassium chloride  40 mEq Oral Q4H   senna  2 tablet Oral Daily   sodium chloride flush  10-40 mL Intracatheter Q12H   spironolactone  12.5 mg Oral Daily   tamsulosin  0.4 mg Oral QPC supper   torsemide  40 mg Oral Daily   vitamin B-12  1,000 mcg Oral QHS    Infusions:  sodium chloride Stopped (06/23/20 0817)   sodium chloride 10 mL/hr at 06/29/20 0000   amiodarone 60 mg/hr (06/29/20 0316)   magnesium sulfate bolus IVPB 4 g (06/29/20 0802)   milrinone 0.375 mcg/kg/min (06/29/20 0552)   norepinephrine (LEVOPHED) Adult infusion 3 mcg/min (06/29/20 0000)    PRN Medications:   Assessment/Plan    Acute systolic HF -> 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 Lactate 9.9  - doubt ischemic as hstrop low despite profound hemodynamic support but given h/p CAD and multiple CRFs will need ischemic eval once more stable and renal function improves - Back in NSR today -> co-ox improved 64% - On milrinone 0.375 and NE 3. Wean NE to 1 - If unable to wean inotropes may need R/L cath to further assess - Continue digoxin  - Continue spiro 12.5 - Volume improved. Continue torsemide 40 daily. - Off GDMT due to need for inotropes  2. CAD - s/p previous LAD stenting last cath 2005 with patent coronaries - doubt ischemia is major factor in HF given low hsTrop however with inability to wean inotropes will consider R/L cath early next week - continue ASA - Continue lipitor 40 mg daily.    3. AF with RVR - Eliquis stopped 6/12 and switched to heparin.  - TEE/DCCV on 6/17-> NSR then went back to AF - Back in NSR - Continue IV amio. Can cut to 30/hr - EP consulted. Not currently a candidate for ablation or CRT-P with wounds.   4.  AKI - due to ATN and shock - peak creatine 2.9 - Resolved.    5. Shock liver - resolved   6. DM2 - SSI    7. Hypokalemia  - K 3.2 - Will supp  8. Sepsis/GN Bacteremia  - PCT 2.7. CXR w/o infiltrate. Foley replaced . BCx Proteus Species.  - Abx narrowed from cefepime to ceftriaxone per ID. Has completed 7 days - sepsis resolved  9. Urinary retention - failed Foley removal - continue Flomax  - will do voiding trial today  10. Wound on left foot - deep but clean based -  WC recs bid dressing changes   11. Hyponatremia - Na stable at 126 today - 6/18 and 6/22 tolvaptan, mentating ok   12. Petechial rash on R hand/L hand /R foot  - ? Cholesterol emboli. - neurovascular intact. No wound - follow   13. Code status - Full Code - Palliative following for goals of care.   CRITICAL CARE Performed by: Glori Bickers  Total critical care time: 35 minutes  Critical care time was exclusive of separately billable procedures and treating other patients.  Critical care was necessary to treat or prevent imminent or life-threatening deterioration.  Critical care was time spent personally by me (independent of midlevel providers or residents) on the following activities: development of treatment plan with patient and/or surrogate as well as nursing, discussions with consultants, evaluation of patient's response to treatment, examination of patient, obtaining history from patient or surrogate, ordering and performing treatments and interventions, ordering and review of laboratory studies, ordering and review of radiographic studies, pulse oximetry and re-evaluation of patient's condition.   Length of Stay: [place], MD  06/29/2020, 8:11 AM  Advanced Heart Failure Team Pager [phone] (M-F; 7a - 5p)  Please contact [facility] for night-coverage after hours (5p -7a ) and weekends on [email or web address]

## 2020-06-29 NOTE — Plan of Care (Signed)

## 2020-06-29 NOTE — Progress Notes (Addendum)
[facility] Progress Note  Patient: Wesley Winters    FKC:[id number]  DOA: 06/14/2020     Date of Service: the patient was seen and examined on 06/29/2020  Brief hospital course: Patient is 80 year old male with history of severe systolic congestive heart failure with ejection fraction 25% suspected to be from tachycardia induced, A. fib, coronary artery disease status post stenting, diabetes type 2 who initially presented to the emergency room with complaints of poor appetite, weakness, fall.  On presentation he was hypotensive, found to have intermittent episodes of A. fib with RVR.  Lab work showed worsening kidney function with creatinine in the range of 2, liver liver enzymes.  Patient was admitted for the management of cardiogenic shock.  Cardiology will primary team 06/24/2020, transferred to [facility] service on 06/25/2020.  On milrinone drip. 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 Echo done on 5/22 showed ejection fraction of 20 to 25%, he was found to have elevated lactate level, hypotensive on presentation.  Currently on milrinone and levophed.  Also on digoxin.   Diuresis with torsemide/aldactone Management per advanced heart failure service May need R/L heart cath per cardiology   Paroxysmal A. fib with RVR:  S/p TEE/DCCV on 6/17, has gone back and forth between sinus rhythm and A fib. Currently in Sinus rhythm Currently on IV amiodarone, digoxin, on Eliquis for anticoagulation.   Monitor on telemetry.   Not a candidate for ablation   Coronary artery  disease:  history of LAD stenting.   Currently on aspirin.   Denies any chest pain.   Also on Lipitor.   AKI:  Likely secondary to cardiogenic shock, ATN.   AKI has resolved and renal function at baseline.   Elevated liver enzymes:  Most likely from  shock liver from hypotension/Congestive hepatopathy Resolved.   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 Continue sliding scale insulin,lantus  Blood sugars currently stable   Hypokalemia:  Being monitored and supplemented if necessary. Supplement magnesium as well   Sepsis due to Proteus mirabilis bacteremia secondary to UTI. Gram-negative bacteremia:  Febrile tachycardic and tachypneic on 6/15. Blood cultures showed Proteus species.   Likely source is urine but urine culture did not show any growth.  Procalcitonin was elevated.  ID was following.  Completed 7 days course of ceftriaxone. Sepsis physiology has resolved.  Leukocytosis resolved   Normocytic anemia:  Hemoglobin in the range of 8.   Currently stable.   Anemia is most likely secondary to chronic medical conditions.  No evidence of acute blood loss   Acute urine retention:  Failed voiding trial.  Continue Flomax.  Foley catheter discontinued on 6/26 and he appears to be passing urine. Continue to monitor.   Left foot wound/pressure ulcers:  Wound care following.   Petechial rash on the hands and feet:  Unclear etiology.   Looks like an embolic physiology, we will continue monitoring. Platelets stable, kidney function stable.   Hyponatremia:  Secondary to severe congestive heart failure with volume overload.  Given few doses of tolvaptan.   Continue fluid restriction. Continue to follow   Goals of care:  Elderly patient with multiple comorbidities with severe congestive heart failure, poor prognosis.  Palliative care also consulted.  Remains full code.   Deconditioning/debility:  PT/OT recommended skilled nursing facility on  discharge.  TOC consulted   Obesity:  Placing the patient at high risk for poor outcome. Body mass index is 33.35 kg/m.   Pressure Injury 06/25/20 Buttocks Left Stage 2 -  Partial thickness loss of dermis presenting as  a shallow open injury with a red, pink wound bed without slough. 2.7 cm X 2.0 cm open stage 2, surrounding area is blanchable (Active)  06/25/20 0810  Location: Buttocks  Location Orientation: Left  Staging: Stage 2 -  Partial thickness loss of dermis presenting as a shallow open injury with a red, pink wound bed without slough.  Wound Description (Comments): 2.7 cm X 2.0 cm open stage 2, surrounding area is blanchable  Present on Admission: No     Pressure Injury 06/25/20 Buttocks Right Stage 2 -  Partial thickness loss of dermis presenting as a shallow open injury with a red, pink wound bed without slough. 2.3 cm X 1.0 cm stage 2, surrounding area is blanchable (Active)  06/25/20 0811  Location: Buttocks  Location Orientation: Right  Staging: Stage 2 -  Partial thickness loss of dermis presenting as a shallow open injury with a red, pink wound bed without slough.  Wound Description (Comments): 2.3 cm X 1.0 cm stage 2, surrounding area is blanchable  Present on Admission: No     Diet: Cardiac diet DVT Prophylaxis:   Place TED hose Start: 06/16/20 1758 apixaban (ELIQUIS) tablet 5 mg    Advance goals of care discussion: Full code  Family Communication: no family present at bedside today  Disposition:  Status is: Inpatient  Remains inpatient appropriate because:Inpatient level of care appropriate due to severity of illness  Dispo: The patient is from: Home              Anticipated d/c is to: Home              Patient currently is not medically stable to d/c.   Difficult to place patient No        Subjective: Feels well, no shortness of breath or chest pain. Currently on milrinone infusion and still on low dose levophed  Physical Exam:  General exam: Alert, awake, oriented x 3 Respiratory system: Clear to auscultation. Respiratory effort normal. Cardiovascular system:RRR. No murmurs, rubs, gallops. Gastrointestinal system: Abdomen is nondistended, soft and nontender. No  organomegaly or masses felt. Normal bowel sounds heard. Central nervous system: Alert and oriented. No focal neurological deficits. Extremities: 1+ edema bilaterally Skin: No rashes, lesions or ulcers Psychiatry: Judgement and insight appear normal. Mood & affect appropriate.     Vitals:   06/29/20 1545 06/29/20 1600 06/29/20 1615 06/29/20 1630  BP: 108/68 (!) 112/58 (!) 110/91 (!) 113/56  Pulse: 77 81 80 79  Resp: 20 (!) 24 (!) 29 20  Temp:  98.5 F (36.9 C)    TempSrc:  Oral    SpO2: 100% 100% 100% 100%  Weight:      Height:        Intake/Output Summary (Last 24 hours) at 06/29/2020 1926 Last data filed at 06/29/2020 1800 Gross per 24 hour  Intake 1694.26 ml  Output 3374 ml  Net -1679.74 ml   Filed Weights   06/26/20 0915 06/28/20 0600 06/29/20 0446  Weight: 98.2 kg 102 kg 99.5 kg    Data Reviewed: I have personally reviewed and interpreted daily labs, tele strips, imaging. I reviewed all nursing notes, pharmacy notes, vitals, pertinent old records I have discussed plan of care as described above with RN and patient/family.  CBC: Recent Labs  Lab 06/24/20 0436 06/25/20 0240 06/26/20 0514 06/27/20 0511 06/28/20 0432  WBC 13.9* 12.9* 12.7* 11.9* 10.4  HGB 9.1* 8.7* 8.7* 8.8* 8.6*  HCT 30.7* 28.8* 29.0* 30.3* 28.8*  MCV 78.1* 76.4* 77.7* 78.5* 78.0*  PLT 259 269 267 289 450   Basic Metabolic Panel: Recent Labs  Lab 06/25/20 0240 06/25/20 1754 06/25/20 2343 06/26/20 0514 06/27/20 0511 06/28/20 0432 06/29/20 0422 06/29/20 1230  NA 123*   < > 130* 126* 133* 126* 126*  --   K 3.9  --   --  3.6 3.0* 4.4 3.2* 3.6  CL 86*  --   --  90* 104 91* 88*  --   CO2 27  --   --  27 22 27 28   --   GLUCOSE 299*  --   --  391* 212* 232* 288*  --   BUN 16  --   --  13 10 12 11   --   CREATININE 1.05  --   --  0.95 0.73 1.03 1.11  --   CALCIUM 7.6*  --   --  7.2* 5.7* 7.4* 7.1*  --   MG 1.9  --   --  1.8 1.4* 2.1 1.5* 3.9*   < > = values in this interval not displayed.     Studies: No results found.  Scheduled Meds:  apixaban  5 mg Oral BID   atorvastatin  40 mg Oral Daily   Chlorhexidine Gluconate Cloth  6 each Topical Q0600   [START ON 06/30/2020] digoxin  0.0625 mg Oral Daily   [START ON 06/30/2020] docusate sodium  100 mg Oral Daily   insulin aspart  0-15 Units Subcutaneous TID WC   insulin aspart  0-5 Units Subcutaneous QHS   insulin glargine  10 Units Subcutaneous QHS   mupirocin ointment   Topical Q12H   ranolazine  500 mg Oral BID   [START ON 06/30/2020] senna  1 tablet Oral Daily   sodium chloride flush  10-40 mL Intracatheter Q12H   spironolactone  12.5 mg Oral Daily   tamsulosin  0.4 mg Oral QPC supper   torsemide  40 mg Oral Daily   vitamin B-12  1,000 mcg Oral QHS   Continuous Infusions:  sodium chloride Stopped (06/23/20 0817)   sodium chloride 10 mL/hr at 06/29/20 1800   amiodarone 30 mg/hr (06/29/20 1800)   milrinone 0.375 mcg/kg/min (06/29/20 1800)   norepinephrine (LEVOPHED) Adult infusion 1 mcg/min (06/29/20 1800)   PRN Meds: sodium chloride, acetaminophen, liver oil-zinc oxide, ondansetron (ZOFRAN) IV, sodium chloride flush  Time spent: 35 minutes  Author: Kathie Dike MD [facility] 06/29/2020 7:26 PM  To reach On-call, see care teams to locate the attending and reach out via [email or web address]. Between 7PM-7AM, please contact night-coverage If you still have difficulty reaching the attending provider, please page the [facility] (Director on Call) for [facility] on amion for assistance.

## 2020-06-30 ENCOUNTER — Ambulatory Visit: Payer: Medicare Other | Admitting: Podiatry

## 2020-06-30 DIAGNOSIS — E871 Hypo-osmolality and hyponatremia: Secondary | ICD-10-CM

## 2020-06-30 LAB — BASIC METABOLIC PANEL
Anion gap: 9 (ref 5–15)
BUN: 13 mg/dL (ref 8–23)
CO2: 31 mmol/L (ref 22–32)
Calcium: 7.8 mg/dL — ABNORMAL LOW (ref 8.9–10.3)
Chloride: 90 mmol/L — ABNORMAL LOW (ref 98–111)
Creatinine, Ser: 1.39 mg/dL — ABNORMAL HIGH (ref 0.61–1.24)
GFR, Estimated: 51 mL/min — ABNORMAL LOW (ref >60)
Glucose, Bld: 146 mg/dL — ABNORMAL HIGH (ref 70–99)
Potassium: 3.5 mmol/L (ref 3.5–5.1)
Sodium: 130 mmol/L — ABNORMAL LOW (ref 135–145)

## 2020-06-30 LAB — COOXEMETRY PANEL
Carboxyhemoglobin: 1.4 % (ref 0.5–1.5)
Methemoglobin: 0.8 % (ref 0.0–1.5)
O2 Saturation: 58.9 %
Total hemoglobin: 9 g/dL — ABNORMAL LOW (ref 12.0–16.0)

## 2020-06-30 LAB — GLUCOSE, CAPILLARY
Glucose-Capillary: 156 mg/dL — ABNORMAL HIGH (ref 70–99)
Glucose-Capillary: 135 mg/dL — ABNORMAL HIGH (ref 70–99)
Glucose-Capillary: 206 mg/dL — ABNORMAL HIGH (ref 70–99)
Glucose-Capillary: 126 mg/dL — ABNORMAL HIGH (ref 70–99)
Glucose-Capillary: 233 mg/dL — ABNORMAL HIGH (ref 70–99)
Glucose-Capillary: 100 mg/dL — ABNORMAL HIGH (ref 70–99)
Glucose-Capillary: 172 mg/dL — ABNORMAL HIGH (ref 70–99)
Glucose-Capillary: 202 mg/dL — ABNORMAL HIGH (ref 70–99)

## 2020-06-30 LAB — MAGNESIUM: Magnesium: 2.1 mg/dL (ref 1.7–2.4)

## 2020-06-30 MED ORDER — TORSEMIDE 20 MG PO TABS
20.0000 mg | ORAL_TABLET | Freq: Every day | ORAL | Status: DC
  Administered 2020-06-30 – 2020-07-09 (×10): 20 mg via ORAL
  Filled 2020-06-30 (×10): qty 1

## 2020-06-30 MED ORDER — POTASSIUM CHLORIDE CRYS ER 20 MEQ PO TBCR
30.0000 meq | EXTENDED_RELEASE_TABLET | Freq: Once | ORAL | Status: AC
  Administered 2020-06-30: 30 meq via ORAL
  Filled 2020-06-30: qty 1

## 2020-06-30 NOTE — Progress Notes (Signed)
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 fib, coronary artery disease status post stenting, diabetes type 2 who initially presented to the emergency room with complaints of poor appetite, weakness, fall.  On presentation he was hypotensive, found to have intermittent episodes of A. fib with RVR.  Lab work showed worsening kidney function with creatinine in the range of 2, liver liver enzymes.  Patient was admitted for the management of cardiogenic shock.  Cardiology will primary team 06/24/2020, transferred to [facility] service on 06/25/2020.  On milrinone drip. 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 Echo done on 5/22 showed ejection fraction of 20 to 25%, he was found to have elevated lactate level, hypotensive on presentation.  Currently on milrinone and levophed.  Also on digoxin.   Diuresis with torsemide/aldactone Management per advanced heart failure service May need R/L heart cath per cardiology   Paroxysmal A. fib with RVR:  S/p TEE/DCCV on 6/17, has gone back and forth between sinus rhythm and A fib. Currently in Sinus rhythm Currently on IV amiodarone, digoxin, on Eliquis for anticoagulation.   Monitor on telemetry.   Not a candidate for ablation   Coronary artery  disease:  history of LAD stenting.   Currently on aspirin.   Denies any chest pain.   Also on Lipitor.   AKI:  Likely secondary to cardiogenic shock, ATN.   AKI has resolved and renal function at baseline.   Elevated liver enzymes:  Most likely from  shock liver from hypotension/Congestive hepatopathy Resolved.   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 Continue sliding scale insulin,lantus  Blood sugars currently stable   Hypokalemia:  Being monitored and supplemented if necessary. Supplement magnesium as well   Sepsis due to Proteus mirabilis bacteremia secondary to UTI. Gram-negative bacteremia:  Febrile tachycardic and tachypneic on 6/15. Blood cultures showed Proteus species.   Likely source is urine but urine culture did not show any growth.  Procalcitonin was elevated.  ID was following.  Completed 7 days course of ceftriaxone. Sepsis physiology has resolved.  Leukocytosis resolved   Normocytic anemia:  Hemoglobin in the range of 8.   Currently stable.   Anemia is most likely secondary to chronic medical conditions.  No evidence of acute blood loss   Acute urine retention:  Failed voiding trial.  Currently on  Flomax.  Foley catheter discontinued on 6/26 and he appears to be passing urine.  There was some concern that he may be incompletely emptying his bladder Bladder scan shows 350cc of urine Patient did not describe any discomfort Would continue to monitor for now and repeat bladder scan as needed If more than 400cc on bladder scan and patient cannot urinate, then would consider re-inserting foley catheter   Left foot wound/pressure ulcers:  Wound care following.   Petechial rash on the hands and feet:  Unclear etiology.   Looks like an embolic physiology, we will continue monitoring. Platelets stable, kidney function stable. Overall improving   Hyponatremia:  Secondary to severe congestive heart  failure with volume overload.  Given few doses of tolvaptan.   Continue fluid restriction. Improving with diuresis Continue to follow   Goals of care:  Elderly patient with multiple comorbidities with severe congestive heart failure, poor  prognosis.  Palliative care also consulted.  Remains full code.   Deconditioning/debility:  PT/OT recommended skilled nursing facility on discharge.  TOC consulted   Obesity:  Placing the patient at high risk for poor outcome. Body mass index is 33.05 kg/m.   Pressure Injury 06/25/20 Buttocks Left Stage 2 -  Partial thickness loss of dermis presenting as a shallow open injury with a red, pink wound bed without slough. 2.7 cm X 2.0 cm open stage 2, surrounding area is blanchable (Active)  06/25/20 0810  Location: Buttocks  Location Orientation: Left  Staging: Stage 2 -  Partial thickness loss of dermis presenting as a shallow open injury with a red, pink wound bed without slough.  Wound Description (Comments): 2.7 cm X 2.0 cm open stage 2, surrounding area is blanchable  Present on Admission: No     Pressure Injury 06/25/20 Buttocks Right Stage 2 -  Partial thickness loss of dermis presenting as a shallow open injury with a red, pink wound bed without slough. 2.3 cm X 1.0 cm stage 2, surrounding area is blanchable (Active)  06/25/20 0811  Location: Buttocks  Location Orientation: Right  Staging: Stage 2 -  Partial thickness loss of dermis presenting as a shallow open injury with a red, pink wound bed without slough.  Wound Description (Comments): 2.3 cm X 1.0 cm stage 2, surrounding area is blanchable  Present on Admission: No     Diet: Cardiac diet DVT Prophylaxis:   Place TED hose Start: 06/16/20 1758 apixaban (ELIQUIS) tablet 5 mg    Advance goals of care discussion: Full code  Family Communication: discussed with wife at the bedside today  Disposition:  Status is: Inpatient  Remains inpatient appropriate because:Inpatient level of care appropriate due to severity of illness  Dispo: The patient is from: Home              Anticipated d/c is to: Home              Patient currently is not medically stable to d/c.   Difficult to place patient No        Subjective: no  shortness of breath. No chest pain. Still requiring low dose levophed  Physical Exam:  General exam: Alert, awake, oriented x 3 Respiratory system: Clear to auscultation. Respiratory effort normal. Cardiovascular system:RRR. No murmurs, rubs, gallops. Gastrointestinal system: Abdomen is nondistended, soft and nontender. No organomegaly or masses felt. Normal bowel sounds heard. Central nervous system: Alert and oriented. No focal neurological deficits. Extremities: 1+ edema bilaterally Skin: No rashes, lesions or ulcers Psychiatry: Judgement and insight appear normal. Mood & affect appropriate.     Vitals:   06/30/20 0900 06/30/20 1000 06/30/20 1100 06/30/20 1116  BP: (!) 86/60 108/62 108/63   Pulse: 78 73 75   Resp: 14 17 16    Temp:    98.2 F (36.8 C)  TempSrc:    Oral  SpO2: 97% 100% 100%   Weight:      Height:        Intake/Output Summary (Last 24 hours) at 06/30/2020 1159 Last data filed at 06/30/2020 1000 Gross per 24 hour  Intake 1950.04 ml  Output 1516 ml  Net 434.04 ml   Filed Weights   06/28/20 0600 06/29/20 0446 06/30/20 0430  Weight: 102 kg 99.5 kg 98.6 kg    Data Reviewed: I have personally reviewed and interpreted daily labs, tele strips, imaging. I reviewed all nursing notes, pharmacy notes, vitals, pertinent old records I have discussed plan of care as described above with RN and patient/family.  CBC: Recent Labs  Lab 06/24/20 0436 06/25/20 0240 06/26/20 0514 06/27/20 0511 06/28/20 0432  WBC 13.9* 12.9* 12.7* 11.9* 10.4  HGB 9.1* 8.7* 8.7* 8.8* 8.6*  HCT 30.7* 28.8* 29.0* 30.3* 28.8*  MCV 78.1* 76.4* 77.7* 78.5* 78.0*  PLT 259 269 267 289 440   Basic Metabolic Panel: Recent Labs  Lab 06/26/20 0514 06/27/20 0511 06/28/20 0432 06/29/20 0422 06/29/20 1230 06/30/20 0431 06/30/20 0908  NA 126* 133* 126* 126*  --   --  130*  K 3.6 3.0* 4.4 3.2* 3.6  --  3.5  CL 90* 104 91* 88*  --   --  90*  CO2 27 22 27 28   --   --  31  GLUCOSE 391*  212* 232* 288*  --   --  146*  BUN 13 10 12 11   --   --  13  CREATININE 0.95 0.73 1.03 1.11  --   --  1.39*  CALCIUM 7.2* 5.7* 7.4* 7.1*  --   --  7.8*  MG 1.8 1.4* 2.1 1.5* 3.9* 2.1  --     Studies: No results found.  Scheduled Meds:  apixaban  5 mg Oral BID   atorvastatin  40 mg Oral Daily   Chlorhexidine Gluconate Cloth  6 each Topical Q0600   digoxin  0.0625 mg Oral Daily   docusate sodium  100 mg Oral Daily   insulin aspart  0-15 Units Subcutaneous TID WC   insulin aspart  0-5 Units Subcutaneous QHS   insulin glargine  10 Units Subcutaneous QHS   mupirocin ointment   Topical Q12H   ranolazine  500 mg Oral BID   senna  1 tablet Oral Daily   sodium chloride flush  10-40 mL Intracatheter Q12H   spironolactone  12.5 mg Oral Daily   tamsulosin  0.4 mg Oral QPC supper   torsemide  20 mg Oral Daily   vitamin B-12  1,000 mcg Oral QHS   Continuous Infusions:  sodium chloride Stopped (06/23/20 0817)   sodium chloride Stopped (06/30/20 0429)   amiodarone 30 mg/hr (06/30/20 1000)   milrinone 0.375 mcg/kg/min (06/30/20 1058)   norepinephrine (LEVOPHED) Adult infusion 1 mcg/min (06/30/20 1000)   PRN Meds: sodium chloride, acetaminophen, liver oil-zinc oxide, ondansetron (ZOFRAN) IV, sodium chloride flush  Time spent: 35 minutes  Author: Kathie Dike MD [facility] 06/30/2020 11:59 AM  To reach On-call, see care teams to locate the attending and reach out via [email or web address]. Between 7PM-7AM, please contact night-coverage If you still have difficulty reaching the attending provider, please page the [facility] (Director on Call) for [facility] on amion for assistance.

## 2020-06-30 NOTE — Progress Notes (Addendum)
Advanced Heart Failure Rounding Note  PCP-Cardiologist: Sinclair Grooms, MD   Subjective:   Cardiogenic shock. New onset AF.   6/12 CO-OX 43% --->Started on milrinone 0.25 mcg.  6/13 Milrinone increased to 0.375. Co-ox 58% today.  6/15 Back in Afib w/ RVR, amio rebolused, rate increased to 60  6/15 Fever spike + leukocytosis + hypotension>>started on empiric cefepime + NE. BCx with proteus 6/17 TEE/DC-CV briefly in SR 6/19 Back in AF. Milrinone cut back to 0.25 mcg.  6/20 Developed petechiae R fingers.  6/21  Milrinone increased to 0.375 and NE 3 added. 6/24 Attempted to wean milrinone to 0.25 and NE to 2 -> co-ox dropped 48%-> 33%   On Milrinone 0.375 mcg + NE at 1. Co-ox 58%.   Maintaining NSR. HR 70s  Wt down 1 lb. Scr stable 1.3 CVP 5-6  Na 127   Meeting w/ palliative care team. Still full code currently.   Objective:   Weight Range: 98.6 kg Body mass index is 33.05 kg/m.   Vital Signs:   Temp:  [97.9 F (36.6 C)-98.7 F (37.1 C)] 97.9 F (36.6 C) (06/27 0726) Pulse Rate:  [68-86] 72 (06/27 0800) Resp:  [11-29] 14 (06/27 0800) BP: (86-150)/(47-101) 110/58 (06/27 0800) SpO2:  [98 %-100 %] 98 % (06/27 0800) Weight:  [98.6 kg] 98.6 kg (06/27 0430) Last BM Date: 06/29/20  Weight change: Filed Weights   06/28/20 0600 06/29/20 0446 06/30/20 0430  Weight: 102 kg 99.5 kg 98.6 kg    Intake/Output:   Intake/Output Summary (Last 24 hours) at 06/30/2020 0831 Last data filed at 06/30/2020 0800 Gross per 24 hour  Intake 1768 ml  Output 1864 ml  Net -96 ml     Physical Exam   PHYSICAL EXAM: CVP 5-6 General:  Well appearing elderly WM. No respiratory difficulty HEENT: normal Neck: supple. no JVD. Carotids 2+ bilat; no bruits. No lymphadenopathy or thyromegaly appreciated. Cor: PMI nondisplaced. Regular rate & rhythm. No rubs, gallops or murmurs. Lungs: clear Abdomen: soft, nontender, nondistended. No hepatosplenomegaly. No bruits or masses. Good bowel  sounds. Extremities: no cyanosis, clubbing, rash, edema Neuro: alert & oriented x 3, cranial nerves grossly intact. moves all 4 extremities w/o difficulty. Affect pleasant.    Telemetry    NSR 70s Personally reviewed   Labs    CBC Recent Labs    06/28/20 0432  WBC 10.4  HGB 8.6*  HCT 28.8*  MCV 78.0*  PLT 397   Basic Metabolic Panel Recent Labs    06/28/20 0432 06/29/20 0422 06/29/20 1230 06/30/20 0431  NA 126* 126*  --   --   K 4.4 3.2* 3.6  --   CL 91* 88*  --   --   CO2 27 28  --   --   GLUCOSE 232* 288*  --   --   BUN 12 11  --   --   CREATININE 1.03 1.11  --   --   CALCIUM 7.4* 7.1*  --   --   MG 2.1 1.5* 3.9* 2.1   Liver Function Tests No results for input(s): AST, ALT, ALKPHOS, BILITOT, PROT, ALBUMIN in the last 72 hours.  No results for input(s): LIPASE, AMYLASE in the last 72 hours. Cardiac Enzymes No results for input(s): CKTOTAL, CKMB, CKMBINDEX, TROPONINI in the last 72 hours.  BNP: BNP (last 3 results) Recent Labs    05/26/20 1429 06/15/20 0729  BNP 1,609.8* 2,424.4*    ProBNP (last 3 results) Recent Labs  07/11/19 1107 04/23/20 1446  PROBNP 445 2,488*     D-Dimer No results for input(s): DDIMER in the last 72 hours. Hemoglobin A1C No results for input(s): HGBA1C in the last 72 hours. Fasting Lipid Panel No results for input(s): CHOL, HDL, LDLCALC, TRIG, CHOLHDL, LDLDIRECT in the last 72 hours. Thyroid Function Tests No results for input(s): TSH, T4TOTAL, T3FREE, THYROIDAB in the last 72 hours.  Invalid input(s): FREET3   Other results:   Imaging    No results found.   Medications:     Scheduled Medications:  apixaban  5 mg Oral BID   atorvastatin  40 mg Oral Daily   Chlorhexidine Gluconate Cloth  6 each Topical Q0600   digoxin  0.0625 mg Oral Daily   docusate sodium  100 mg Oral Daily   insulin aspart  0-15 Units Subcutaneous TID WC   insulin aspart  0-5 Units Subcutaneous QHS   insulin glargine  10 Units  Subcutaneous QHS   mupirocin ointment   Topical Q12H   ranolazine  500 mg Oral BID   senna  1 tablet Oral Daily   sodium chloride flush  10-40 mL Intracatheter Q12H   spironolactone  12.5 mg Oral Daily   tamsulosin  0.4 mg Oral QPC supper   torsemide  40 mg Oral Daily   vitamin B-12  1,000 mcg Oral QHS    Infusions:  sodium chloride Stopped (06/23/20 0817)   sodium chloride Stopped (06/30/20 0429)   amiodarone 30 mg/hr (06/30/20 0800)   milrinone 0.375 mcg/kg/min (06/30/20 0800)   norepinephrine (LEVOPHED) Adult infusion 1 mcg/min (06/30/20 0800)    PRN Medications:   Assessment/Plan    Acute systolic HF -> 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 Lactate 9.9  - doubt ischemic as hstrop low despite profound hemodynamic support but given h/p CAD and multiple CRFs will need ischemic eval once more stable and renal function improves -  Converted to NSR. HR controlled - On milrinone 0.375 and NE 1. Co-ox 58% - Plan slow inotrope wean.  Stop NE, add midodrine to support BP. Continue milrinone 0.375. Monitor co-ox closely  - CVP 5-6 Continue torsemide 20 mg daily  - Continue digoxin  - Continue spiro 12.5 - Off GDMT due to need for inotropes  2. CAD - s/p previous LAD stenting last cath 2005 with patent coronaries - doubt ischemia is major factor in HF given low hsTrop however with inability to wean inotropes will consider R/L cath early next week - continue ASA - Continue lipitor 40 mg daily.    3. AF with RVR - Eliquis stopped 6/12 and switched to heparin.  - TEE/DCCV on 6/17-> NSR then went back to AF - Back in NSR - Continue IV amio, 30/hr  - EP consulted. Not currently a candidate for ablation or CRT-P with wounds.   4.  AKI - due to ATN and shock - peak creatine 2.9 - Resolved. SCr 1.3 and  stable   5. Shock liver - resolved   6. DM2 - SSI    7. Hypokalemia  - resolved, K 3.7   8. Sepsis/GN Bacteremia  - PCT 2.7. CXR w/o infiltrate. Foley replaced . BCx Proteus Species.  - Abx narrowed from cefepime to ceftriaxone per ID. Has completed 7 days - sepsis resolved  9. Urinary retention -  failed Foley removal - continue Flomax   10. Wound on left foot - deep but clean based - WC recs bid dressing changes   11. Hyponatremia - 6/18 and 6/22 tolvaptan, mentating ok  -  Na 127 today - fluid restrict and monitor   12. Petechial rash on R hand/L hand /R foot  - ? Cholesterol emboli. - neurovascular intact. No wound - follow   13. Code status - Full Code - Palliative following for goals of care.   14. Deconditioning - PT/OT following, currently recommend SNF - may benefit from CIR   Length of Stay: [address] PA-C  06/30/2020, 8:31 AM  Advanced Heart Failure Team Pager [phone] (M-F; 7a - 5p)  Please contact [facility] for night-coverage after hours (5p -7a ) and weekends on [email or web address]  Patient seen and examined with the above-signed Advanced Practice Provider and/or Housestaff. I personally reviewed laboratory data, imaging studies and relevant notes. I independently examined the patient and formulated the important aspects of the plan. I have edited the note to reflect any of my changes or salient points. I have personally discussed the plan with the patient and/or family.  Remains in NSR. Off NE. On milrinone 0.375. Co-ox 58% CVP ok  General:  Lying in bed. No resp difficulty HEENT: normal Neck: supple. no JVD. Carotids 2+ bilat; no bruits. No lymphadenopathy or thryomegaly appreciated. Cor: PMI nondisplaced. Regular rate & rhythm. No rubs, gallops or murmurs. Lungs: clear Abdomen: soft, nontender, nondistended. No hepatosplenomegaly. No bruits or masses. Good bowel sounds. Extremities: no cyanosis, clubbing, rash, edema Neuro: alert &  orientedx3, cranial nerves grossly intact. moves all 4 extremities w/o difficulty. Affect pleasant  Maintaining his CO off NE. Will begin slow milrinone wean. Drop to 0.25. Can go to SDU. Continue Eliquis. Follow CVPs.   Glori Bickers, MD  1:27 PM

## 2020-06-30 NOTE — Progress Notes (Signed)
Pt on cpap athis time.

## 2020-07-01 ENCOUNTER — Encounter (HOSPITAL_COMMUNITY)
Admission: EM | Disposition: A | Discharge status: Skilled Nursing Facility | Payer: Medicare Other | Source: Home / Self Care | Attending: Internal Medicine

## 2020-07-01 ENCOUNTER — Ambulatory Visit (HOSPITAL_COMMUNITY): Admission: RE | Admit: 2020-07-01 | Payer: Medicare Other | Source: Home / Self Care | Admitting: Cardiovascular Disease

## 2020-07-01 DIAGNOSIS — I5042 Chronic combined systolic (congestive) and diastolic (congestive) heart failure: Secondary | ICD-10-CM

## 2020-07-01 LAB — RENAL FUNCTION PANEL
Albumin: 1.8 g/dL — ABNORMAL LOW (ref 3.5–5.0)
Anion gap: 8 (ref 5–15)
BUN: 15 mg/dL (ref 8–23)
CO2: 30 mmol/L (ref 22–32)
Calcium: 7.8 mg/dL — ABNORMAL LOW (ref 8.9–10.3)
Chloride: 89 mmol/L — ABNORMAL LOW (ref 98–111)
Creatinine, Ser: 1.34 mg/dL — ABNORMAL HIGH (ref 0.61–1.24)
GFR, Estimated: 53 mL/min — ABNORMAL LOW (ref >60)
Glucose, Bld: 165 mg/dL — ABNORMAL HIGH (ref 70–99)
Phosphorus: 3.3 mg/dL (ref 2.5–4.6)
Potassium: 3.7 mmol/L (ref 3.5–5.1)
Sodium: 127 mmol/L — ABNORMAL LOW (ref 135–145)

## 2020-07-01 LAB — GLUCOSE, CAPILLARY
Glucose-Capillary: 183 mg/dL — ABNORMAL HIGH (ref 70–99)
Glucose-Capillary: 138 mg/dL — ABNORMAL HIGH (ref 70–99)
Glucose-Capillary: 101 mg/dL — ABNORMAL HIGH (ref 70–99)
Glucose-Capillary: 158 mg/dL — ABNORMAL HIGH (ref 70–99)

## 2020-07-01 LAB — COOXEMETRY PANEL
Carboxyhemoglobin: 1.5 % (ref 0.5–1.5)
Methemoglobin: 0.8 % (ref 0.0–1.5)
O2 Saturation: 58 %
Total hemoglobin: 8.7 g/dL — ABNORMAL LOW (ref 12.0–16.0)

## 2020-07-01 LAB — MAGNESIUM: Magnesium: 1.5 mg/dL — ABNORMAL LOW (ref 1.7–2.4)

## 2020-07-01 SURGERY — CARDIOVERSION
Anesthesia: General

## 2020-07-01 MED ORDER — POTASSIUM CHLORIDE CRYS ER 10 MEQ PO TBCR
40.0000 meq | EXTENDED_RELEASE_TABLET | Freq: Once | ORAL | Status: AC
  Administered 2020-07-01: 40 meq via ORAL
  Filled 2020-07-01 (×2): qty 4

## 2020-07-01 MED ORDER — MIDODRINE HCL 5 MG PO TABS
5.0000 mg | ORAL_TABLET | Freq: Three times a day (TID) | ORAL | Status: DC
  Administered 2020-07-01 – 2020-07-05 (×12): 5 mg via ORAL
  Filled 2020-07-01 (×12): qty 1

## 2020-07-01 MED ORDER — MAGNESIUM SULFATE 4 GM/100ML IV SOLN
4.0000 g | Freq: Once | INTRAVENOUS | Status: AC
  Administered 2020-07-01: 4 g via INTRAVENOUS
  Filled 2020-07-01: qty 100

## 2020-07-01 NOTE — TOC Progression Note (Signed)
Transition of Care [facility]) - Progression Note    Patient Details  Name: Wesley Winters MRN: [id number] Date of Birth: 17-Dec-1940  Transition of Care [facility]) CM/SW [place], [place] Phone Number: 07/01/2020, 11:48 AM  Clinical Narrative:     CSW was notified that pt spouse requesting to speak with CSW. CSW called and left message with pt spouse. Attempted to meet with pt and spouse bedside but at the time they were meeting with palliative. CSW will attempt again at later time.   Expected Discharge Plan: [place] Barriers to Discharge: Continued Medical Work up  Expected Discharge Plan and Services Expected Discharge Plan: [place] In-house Referral: Clinical Social Work Discharge Planning Services: CM Consult   Living arrangements for the past 2 months: Single Family Home                                       Social Determinants of Health (SDOH) Interventions    Readmission Risk Interventions No flowsheet data found.

## 2020-07-01 NOTE — Progress Notes (Signed)
Patient ID: Wesley Winters, male   DOB: Apr 17, 1940, 80 y.o.   MRN: [id number]    Progress Note from the Palliative Medicine Team at [facility]   Patient Name: Wesley Winters        Date: 07/01/2020 DOB: March 05, 1940  Age: 80 y.o. 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 fib, coronary artery disease status post stenting, diabetes type 2 who initially presented to the emergency room with complaints of poor appetite, weakness, fall.  On presentation he was hypotensive, found to have intermittent episodes of A. fib with RVR.  Lab work showed worsening kidney function with creatinine in the range of 3, elevated liver enzymes.  Patient was admitted for the management of cardiogenic shock.  Cardiology was primary team 06/24/2020, transferred to [facility] service on 06/25/2020.  On milrinone drip. Patient had to be transferred back to ICU and levophed was added to heart failure regimen.  Assessment and Plan  -  Ongoing ---Acute on chronic systolic congestive heart failure, A. fib with RVR, coronary artery disease, acute kidney injury, generalized weakness and fatigue.    Today Day 16 of this hospitalization.     Not medically stable for discharge  Patient and family face ongoing treatment option decisions, advanced directive decisions and anticipatory care needs.   This NP visited patient at the bedside as a follow up for palliative medicine needs and emotional support. Patient is alert and oriented, his  wife is at the bedside   Emotional discussion had today regarding current medical situation.  He recognizes that in spite of his efforts in working with therapies he remains weak and requiring significant nursing care.  Education offered on  concepts regarding human mortality and adult failure to thrive.  Education offered on the limitations of medical interventions to prolong quality of life when the body fails to thrive.  Created space and opportunity for patient and family to explore the thoughts and feelings regarding current medical situation. Wesley Winters quietly verbalizes his fear and anxiety regarding his significant medical issues at fear of the unknown, his own personal mortality.  Patient's wife is very pragmatic as she speaks to her own/her death and her own specific end-of-life wishes.  Education offered on the difference between an aggressive medical intervention path and a palliative comfort path for this patient at this time in this situation.  Education offered on various transition of care options.  Skilled nursing facility for short-term rehab, skilled nursing facility for long-term care, home with home health and home with hospice services discussed.  Education offered on the importance of  continued conversation with family and their  medical providers regarding overall plan of care and treatment options,  ensuring decisions are within the context of the patients values and GOCs.  Today Wesley Winters made decision to implement a partial code; no compression, no cardioversion no intubation.  I will document.    Family verbalize concern for "what the next steps are" in the treatment plan.    Questions and concerns addressed   PMT will continue to support holistically   Total time spent on the unit was 70 minutes   Greater than 50% of the time was spent in counseling and coordination of care  [name] NP  Palliative Medicine Team Team  Phone # [phone] Pager [phone]

## 2020-07-01 NOTE — Progress Notes (Signed)
Physical Therapy Treatment Patient Details Name: Wesley Winters MRN: [id number] DOB: 04-05-1940 Today's Date: 07/01/2020    History of Present Illness Pt is 80 yo admitted 6/11 after 2 falls at home with fatigue and weakness.  Pt found to have afib with RVR, hypotensive, worsening kidney function and admitted for cardiogenic shock. PMHx; HF, cardiomyopathy with EF 25%, Afib, CAD, DM, HTN, recent admit 5/23-5/31    PT Comments    Pt making gradual progress.  He does fatigue very easily and needs close chair follow.  Required frequent cues for safety, posture, and sequencing with transfers.  All VSS during session.  Continue plan of care.    Follow Up Recommendations  SNF;Supervision for mobility/OOB     Equipment Recommendations  Rolling walker with 5" wheels;3in1 (PT)    Recommendations for Other Services       Precautions / Restrictions Precautions Precautions: Fall Precaution Comments: stool incontinence    Mobility  Bed Mobility Overal bed mobility: Needs Assistance Bed Mobility: Rolling;Sidelying to Sit Rolling: Min assist Sidelying to sit: Mod assist       General bed mobility comments: Mod A to lift trunk for sitting    Transfers Overall transfer level: Needs assistance Equipment used: Rolling walker (2 wheeled) Transfers: Sit to/from Stand Sit to Stand: Min assist         General transfer comment: Cues for hand placement, leaning forward, and min A to rise and steady.  Frequent cues for safe return to sitting technique.  Performed 5 throughout session  Ambulation/Gait Ambulation/Gait assistance: Min guard;+2 safety/equipment Gait Distance (Feet): 60 Feet (60'x3) Assistive device: Rolling walker (2 wheeled) Gait Pattern/deviations: Step-through pattern;Decreased stride length;Trunk flexed Gait velocity: decr   General Gait Details: Frequent cues for safety with turns.  Ambulated 60'x3 with easy fatigue.  Needed a chair follow   Stairs              Wheelchair Mobility    Modified Rankin (Stroke Patients Only)       Balance Overall balance assessment: Needs assistance Sitting-balance support: No upper extremity supported;Feet supported Sitting balance-Leahy Scale: Good     Standing balance support: Bilateral upper extremity supported Standing balance-Leahy Scale: Poor Standing balance comment: RW for support                            Cognition Arousal/Alertness: Awake/alert Behavior During Therapy: Anxious Overall Cognitive Status: No family/caregiver present to determine baseline cognitive functioning Area of Impairment: Problem solving;Memory                     Memory: Decreased short-term memory   Safety/Judgement: Decreased awareness of safety   Problem Solving: Slow processing;Requires verbal cues        Exercises      General Comments General comments (skin integrity, edema, etc.): VSS with BP 120's/80's during session      Pertinent Vitals/Pain Pain Assessment: No/denies pain    Home Living                      Prior Function            PT Goals (current goals can now be found in the care plan section) Acute Rehab PT Goals Patient Stated Goal: I need to get a little stronger PT Goal Formulation: With patient Time For Goal Achievement: 07/15/20 Potential to Achieve Goals: Good Progress towards PT goals: Progressing toward goals  Frequency    Min 3X/week      PT Plan Current plan remains appropriate    Co-evaluation              AM-PAC PT "6 Clicks" Mobility   Outcome Measure  Help needed turning from your back to your side while in a flat bed without using bedrails?: A Little Help needed moving from lying on your back to sitting on the side of a flat bed without using bedrails?: A Little Help needed moving to and from a bed to a chair (including a wheelchair)?: A Little Help needed standing up from a chair using your arms (e.g.,  wheelchair or bedside chair)?: A Little Help needed to walk in hospital room?: A Little Help needed climbing 3-5 steps with a railing? : A Lot 6 Click Score: 17    End of Session Equipment Utilized During Treatment: Gait belt Activity Tolerance: Patient tolerated treatment well Patient left: in chair;with call bell/phone within reach;with nursing/sitter in room Nurse Communication: Mobility status PT Visit Diagnosis: Other abnormalities of gait and mobility (R26.89);Difficulty in walking, not elsewhere classified (R26.2)     Time: 1093-2355 PT Time Calculation (min) (ACUTE ONLY): 26 min  Charges:  $Gait Training: 8-22 mins $Therapeutic Activity: 8-22 mins                     Abran Richard, PT Acute Rehab Services Pager [phone] [facility] [place] 07/01/2020, 4:22 PM

## 2020-07-01 NOTE — Progress Notes (Signed)
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 fib, coronary artery disease status post stenting, diabetes type 2 who initially presented to the emergency room with complaints of poor appetite, weakness, fall.  On presentation he was hypotensive, found to have intermittent episodes of A. fib with RVR.  Lab work showed worsening kidney function with creatinine in the range of 3, elevated liver enzymes.  Patient was admitted for the management of cardiogenic shock.  Cardiology was primary team 06/24/2020, transferred to [facility] service on 06/25/2020.  On milrinone drip. 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 Echo done on 5/22 showed ejection fraction of 20 to 25%, he was found to have elevated lactate level, hypotensive on presentation.  Currently on milrinone and levophed.  Also on digoxin.   Diuresis with torsemide/aldactone Management per advanced heart failure service May need R/L heart cath per cardiology   Paroxysmal A. fib with RVR:  S/p TEE/DCCV on 6/17, has gone back and forth between sinus rhythm and A fib. Currently in Sinus rhythm Currently on IV amiodarone, digoxin, on Eliquis for anticoagulation.   Monitor on telemetry.   Not a candidate for ablation   Coronary artery  disease:  history of LAD stenting.   Currently on aspirin.   Denies any chest pain.   Also on Lipitor.   AKI:  Likely secondary to cardiogenic shock, ATN.   AKI has resolved and renal function at baseline. Baseline creatinine 1.1 Creatinine was 3.2  on admission->improved to 0.7->currently 1.3   Elevated liver enzymes:  Most likely from shock liver from hypotension/Congestive hepatopathy Resolved.   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 Continue sliding scale insulin,lantus  Blood sugars currently stable   Hypokalemia/hypomagnesemia:  Being monitored and supplemented if necessary. Supplement magnesium as well   Sepsis due to Proteus mirabilis bacteremia secondary to UTI. Gram-negative bacteremia:  Febrile tachycardic and tachypneic on 6/15. Blood cultures showed Proteus species.   Likely source is urine but urine culture did not show any growth.  Procalcitonin was elevated.  ID was following.  Completed 7 days course of ceftriaxone. Sepsis physiology has resolved.  Leukocytosis resolved   Normocytic anemia:  Hemoglobin in the range of 8.   Currently stable.   Anemia is most likely secondary to chronic medical conditions.  No evidence of acute blood loss   Acute urine retention:  Failed voiding trial.  Currently on  Flomax.  Foley catheter discontinued on 6/26 and he appears to be passing urine.  There was some concern that he may be incompletely emptying his bladder Patient did not describe any discomfort He had 850cc urine out yesterday, although output may not be accurate since he had he voided urine on the floor this morning. I have asked him to empty his bladder as much as he can, and then will check post void residual bladder scan If more than 400cc on bladder scan and patient cannot urinate, then would consider re-inserting foley catheter   Left foot wound/pressure ulcers:  Wound care following.  Petechial rash on the hands and feet:  Unclear etiology.   Looks like an embolic physiology, we will continue monitoring. Platelets stable, kidney function stable. Overall improving   Hyponatremia:  Secondary to severe congestive heart failure with  volume overload.  Given few doses of tolvaptan.   Continue fluid restriction. Continue to follow   Goals of care:  Elderly patient with multiple comorbidities with severe congestive heart failure, poor prognosis.  Palliative care also consulted.  Remains full code.   Deconditioning/debility:  PT/OT recommended skilled nursing facility on discharge.  TOC consulted   Obesity:  Placing the patient at high risk for poor outcome. Body mass index is 32.98 kg/m.   Pressure Injury 06/25/20 Buttocks Left Stage 2 -  Partial thickness loss of dermis presenting as a shallow open injury with a red, pink wound bed without slough. 2.7 cm X 2.0 cm open stage 2, surrounding area is blanchable (Active)  06/25/20 0810  Location: Buttocks  Location Orientation: Left  Staging: Stage 2 -  Partial thickness loss of dermis presenting as a shallow open injury with a red, pink wound bed without slough.  Wound Description (Comments): 2.7 cm X 2.0 cm open stage 2, surrounding area is blanchable  Present on Admission: No     Pressure Injury 06/25/20 Buttocks Right Stage 2 -  Partial thickness loss of dermis presenting as a shallow open injury with a red, pink wound bed without slough. 2.3 cm X 1.0 cm stage 2, surrounding area is blanchable (Active)  06/25/20 0811  Location: Buttocks  Location Orientation: Right  Staging: Stage 2 -  Partial thickness loss of dermis presenting as a shallow open injury with a red, pink wound bed without slough.  Wound Description (Comments): 2.3 cm X 1.0 cm stage 2, surrounding area is blanchable  Present on Admission: No     Diet: Cardiac diet DVT Prophylaxis:   Place TED hose Start: 06/16/20 1758 apixaban (ELIQUIS) tablet 5 mg    Advance goals of care discussion: Full code  Family Communication: discussed with wife at the bedside today  Disposition:  Status is: Inpatient  Remains inpatient appropriate because:Inpatient level of care appropriate due to severity of  illness  Dispo: The patient is from: Home              Anticipated d/c is to: Home              Patient currently is not medically stable to d/c.   Difficult to place patient No        Subjective: denies any shortness of breath, no chest pain. Does not feel any difficulty passing urine. Had a urinary accident this morning where he urinated on the floor.  Physical Exam:  General exam: Alert, awake, oriented x 3 Respiratory system: crackles at bases. Respiratory effort normal. Cardiovascular system:RRR. No murmurs, rubs, gallops. Gastrointestinal system: Abdomen is nondistended, soft and nontender. No organomegaly or masses felt. Normal bowel sounds heard. Central nervous system: Alert and oriented. No focal neurological deficits. Extremities: 1+ edema bilaterally Skin: No rashes, lesions or ulcers Psychiatry: Judgement and insight appear normal. Mood & affect appropriate.     Vitals:   07/01/20 0851 07/01/20 0900 07/01/20 0930 07/01/20 0940  BP:   (!) 116/58   Pulse: 70     Resp:  (!) 21  16  Temp:      TempSrc:      SpO2: 100%     Weight:      Height:  Intake/Output Summary (Last 24 hours) at 07/01/2020 1056 Last data filed at 07/01/2020 1000 Gross per 24 hour  Intake 1172.71 ml  Output 800 ml  Net 372.71 ml   Filed Weights   06/29/20 0446 06/30/20 0430 07/01/20 0324  Weight: 99.5 kg 98.6 kg 98.4 kg    Data Reviewed: I have personally reviewed and interpreted daily labs, tele strips, imaging. I reviewed all nursing notes, pharmacy notes, vitals, pertinent old records I have discussed plan of care as described above with RN and patient/family.  CBC: Recent Labs  Lab 06/25/20 0240 06/26/20 0514 06/27/20 0511 06/28/20 0432  WBC 12.9* 12.7* 11.9* 10.4  HGB 8.7* 8.7* 8.8* 8.6*  HCT 28.8* 29.0* 30.3* 28.8*  MCV 76.4* 77.7* 78.5* 78.0*  PLT 269 267 289 008   Basic Metabolic Panel: Recent Labs  Lab 06/27/20 0511 06/28/20 0432 06/29/20 0422  06/29/20 1230 06/30/20 0431 06/30/20 0908 07/01/20 0409  NA 133* 126* 126*  --   --  130* 127*  K 3.0* 4.4 3.2* 3.6  --  3.5 3.7  CL 104 91* 88*  --   --  90* 89*  CO2 22 27 28   --   --  31 30  GLUCOSE 212* 232* 288*  --   --  146* 165*  BUN 10 12 11   --   --  13 15  CREATININE 0.73 1.03 1.11  --   --  1.39* 1.34*  CALCIUM 5.7* 7.4* 7.1*  --   --  7.8* 7.8*  MG 1.4* 2.1 1.5* 3.9* 2.1  --  1.5*  PHOS  --   --   --   --   --   --  3.3    Studies: No results found.  Scheduled Meds:  apixaban  5 mg Oral BID   atorvastatin  40 mg Oral Daily   Chlorhexidine Gluconate Cloth  6 each Topical Q0600   digoxin  0.0625 mg Oral Daily   docusate sodium  100 mg Oral Daily   insulin aspart  0-15 Units Subcutaneous TID WC   insulin aspart  0-5 Units Subcutaneous QHS   insulin glargine  10 Units Subcutaneous QHS   mupirocin ointment   Topical Q12H   ranolazine  500 mg Oral BID   senna  1 tablet Oral Daily   sodium chloride flush  10-40 mL Intracatheter Q12H   spironolactone  12.5 mg Oral Daily   tamsulosin  0.4 mg Oral QPC supper   torsemide  20 mg Oral Daily   vitamin B-12  1,000 mcg Oral QHS   Continuous Infusions:  sodium chloride Stopped (06/23/20 0817)   sodium chloride Stopped (06/30/20 0429)   amiodarone 30 mg/hr (07/01/20 0900)   milrinone 0.375 mcg/kg/min (07/01/20 0900)   norepinephrine (LEVOPHED) Adult infusion 1 mcg/min (07/01/20 0900)   PRN Meds: sodium chloride, acetaminophen, liver oil-zinc oxide, ondansetron (ZOFRAN) IV, sodium chloride flush  Time spent: 35 minutes  Author: Kathie Dike MD [facility] 07/01/2020 10:56 AM  To reach On-call, see care teams to locate the attending and reach out via [email or web address]. Between 7PM-7AM, please contact night-coverage If you still have difficulty reaching the attending provider, please page the [facility] (Director on Call) for [facility] on amion for assistance.

## 2020-07-02 LAB — COOXEMETRY PANEL
Carboxyhemoglobin: 1.5 % (ref 0.5–1.5)
Carboxyhemoglobin: 1.3 % (ref 0.5–1.5)
Methemoglobin: 1.1 % (ref 0.0–1.5)
Methemoglobin: 0.7 % (ref 0.0–1.5)
O2 Saturation: 50.9 %
O2 Saturation: 44.2 %
Total hemoglobin: 8.1 g/dL — ABNORMAL LOW (ref 12.0–16.0)
Total hemoglobin: 8.3 g/dL — ABNORMAL LOW (ref 12.0–16.0)

## 2020-07-02 LAB — CBC
HCT: 26.9 % — ABNORMAL LOW (ref 39.0–52.0)
Hemoglobin: 8.2 g/dL — ABNORMAL LOW (ref 13.0–17.0)
MCH: 23.5 pg — ABNORMAL LOW (ref 26.0–34.0)
MCHC: 30.5 g/dL (ref 30.0–36.0)
MCV: 77.1 fL — ABNORMAL LOW (ref 80.0–100.0)
Platelets: 233 10*3/uL (ref 150–400)
RBC: 3.49 MIL/uL — ABNORMAL LOW (ref 4.22–5.81)
RDW: 19.9 % — ABNORMAL HIGH (ref 11.5–15.5)
WBC: 8.7 10*3/uL (ref 4.0–10.5)
nRBC: 0 % (ref 0.0–0.2)

## 2020-07-02 LAB — BASIC METABOLIC PANEL
Anion gap: 6 (ref 5–15)
BUN: 15 mg/dL (ref 8–23)
CO2: 30 mmol/L (ref 22–32)
Calcium: 7.5 mg/dL — ABNORMAL LOW (ref 8.9–10.3)
Chloride: 92 mmol/L — ABNORMAL LOW (ref 98–111)
Creatinine, Ser: 1.28 mg/dL — ABNORMAL HIGH (ref 0.61–1.24)
GFR, Estimated: 56 mL/min — ABNORMAL LOW (ref >60)
Glucose, Bld: 109 mg/dL — ABNORMAL HIGH (ref 70–99)
Potassium: 3.7 mmol/L (ref 3.5–5.1)
Sodium: 128 mmol/L — ABNORMAL LOW (ref 135–145)

## 2020-07-02 LAB — GLUCOSE, CAPILLARY
Glucose-Capillary: 109 mg/dL — ABNORMAL HIGH (ref 70–99)
Glucose-Capillary: 200 mg/dL — ABNORMAL HIGH (ref 70–99)
Glucose-Capillary: 164 mg/dL — ABNORMAL HIGH (ref 70–99)
Glucose-Capillary: 134 mg/dL — ABNORMAL HIGH (ref 70–99)

## 2020-07-02 LAB — MAGNESIUM: Magnesium: 3.3 mg/dL — ABNORMAL HIGH (ref 1.7–2.4)

## 2020-07-02 MED ORDER — NITROGLYCERIN 0.4 MG SL SUBL: 0.4000 mg | SUBLINGUAL_TABLET | SUBLINGUAL | Status: DC | PRN

## 2020-07-02 MED ORDER — NITROGLYCERIN 0.3 MG SL SUBL: 0.3000 mg | SUBLINGUAL_TABLET | SUBLINGUAL | Status: DC | PRN

## 2020-07-02 MED ORDER — POTASSIUM CHLORIDE CRYS ER 20 MEQ PO TBCR
20.0000 meq | EXTENDED_RELEASE_TABLET | Freq: Once | ORAL | Status: AC
  Administered 2020-07-02: 20 meq via ORAL
  Filled 2020-07-02: qty 1

## 2020-07-02 MED ORDER — SALINE SPRAY 0.65 % NA SOLN
1.0000 | NASAL | Status: DC | PRN
  Filled 2020-07-02: qty 44

## 2020-07-02 MED ORDER — MORPHINE SULFATE (PF) 2 MG/ML IV SOLN: 0.5000 mg | INTRAVENOUS | Status: DC | PRN

## 2020-07-02 MED ORDER — SODIUM CHLORIDE 0.9 % IV SOLN
510.0000 mg | INTRAVENOUS | Status: DC
  Administered 2020-07-02: 510 mg via INTRAVENOUS
  Filled 2020-07-02 (×2): qty 17

## 2020-07-02 NOTE — Progress Notes (Signed)
Physical Therapy Treatment Patient Details Name: Wesley Winters MRN: [id number] DOB: 1940/10/18 Today's Date: 07/02/2020    History of Present Illness Pt is 80 yo admitted 6/11 after 2 falls at home with fatigue and weakness.  Pt found to have afib with RVR, hypotensive, worsening kidney function and admitted for cardiogenic shock. PMHx; HF, cardiomyopathy with EF 25%, Afib, CAD, DM, HTN, recent admit 5/23-5/31    PT Comments    Pt making gradual progress. He does need encouragement to increase activities with cues for safe transfer techniques.  Pt's wife and daughter present and encouraging pt to work with therapy and increase activity.  He does need close chair follow as he fatigues very easily.  All VSS, did note pt still on Milirone drip.     Follow Up Recommendations  SNF;Supervision for mobility/OOB     Equipment Recommendations  Rolling walker with 5" wheels;3in1 (PT)    Recommendations for Other Services       Precautions / Restrictions Precautions Precautions: Fall Precaution Comments: stool incontinence    Mobility  Bed Mobility Overal bed mobility: Needs Assistance Bed Mobility: Rolling;Sidelying to Sit Rolling: Min assist Sidelying to sit: Mod assist       General bed mobility comments: Mod A to lift trunk for sitting    Transfers Overall transfer level: Needs assistance Equipment used: Rolling walker (2 wheeled) Transfers: Sit to/from Stand Sit to Stand: Min assist         General transfer comment: Cues for hand placement, leaning forward, and min A to rise and steady.  Frequent cues for safe return to sitting technique.  Performed 5 throughout session  Ambulation/Gait Ambulation/Gait assistance: Min guard;+2 safety/equipment Gait Distance (Feet): 90 Feet (45'x2 then 90') Assistive device: Rolling walker (2 wheeled) Gait Pattern/deviations: Step-through pattern;Decreased stride length;Trunk flexed     General Gait Details: Pt easily fatigued.   Ambulated 45'x2 slowly with very small steps and seated rest breaks.  When turned around and told ambulating back to room, pt with increased speed and step length and ambulated 90'. Had close chair follow.  Cues for RW use.  Needs encouragement/pushing - family and therapist provided   Stairs             Wheelchair Mobility    Modified Rankin (Stroke Patients Only)       Balance Overall balance assessment: Needs assistance Sitting-balance support: No upper extremity supported;Feet supported Sitting balance-Leahy Scale: Good Sitting balance - Comments: EOB without support   Standing balance support: Bilateral upper extremity supported Standing balance-Leahy Scale: Poor Standing balance comment: RW and min A at times for support                            Cognition Arousal/Alertness: Awake/alert Behavior During Therapy: Anxious Overall Cognitive Status: Impaired/Different from baseline Area of Impairment: Problem solving;Memory                     Memory: Decreased short-term memory   Safety/Judgement: Decreased awareness of safety   Problem Solving: Slow processing;Requires verbal cues General Comments: pt with decreased problem solving, requires verbal cueing      Exercises General Exercises - Lower Extremity Ankle Circles/Pumps: AROM;Both;10 reps;Seated Quad Sets: AROM;Both;10 reps;Seated Heel Slides: AAROM;Both;10 reps;Seated Other Exercises Other Exercises: hip Abd pillow squeeze x 10 with 3 sec hold    General Comments General comments (skin integrity, edema, etc.): VSS with BP 112/61 post walk.  Pt on  RA with sats maintaining 98%.  Pt's wife and daughter present.  Educated on importance of mobility and OOB activity.  Encouraged AROM exercises as able and OOB for meals with nursing.      Pertinent Vitals/Pain Pain Assessment: No/denies pain    Home Living                      Prior Function            PT Goals (current  goals can now be found in the care plan section) Acute Rehab PT Goals Patient Stated Goal: I need to get a little stronger PT Goal Formulation: With patient Time For Goal Achievement: 07/15/20 Potential to Achieve Goals: Good Progress towards PT goals: Progressing toward goals    Frequency    Min 3X/week      PT Plan Current plan remains appropriate    Co-evaluation              AM-PAC PT "6 Clicks" Mobility   Outcome Measure  Help needed turning from your back to your side while in a flat bed without using bedrails?: A Little Help needed moving from lying on your back to sitting on the side of a flat bed without using bedrails?: A Little Help needed moving to and from a bed to a chair (including a wheelchair)?: A Little Help needed standing up from a chair using your arms (e.g., wheelchair or bedside chair)?: A Little Help needed to walk in hospital room?: A Little Help needed climbing 3-5 steps with a railing? : A Lot 6 Click Score: 17    End of Session Equipment Utilized During Treatment: Gait belt Activity Tolerance: Patient tolerated treatment well Patient left: in chair;with call bell/phone within reach;with family/visitor present Nurse Communication: Mobility status PT Visit Diagnosis: Other abnormalities of gait and mobility (R26.89);Difficulty in walking, not elsewhere classified (R26.2)     Time: 1225-1300 PT Time Calculation (min) (ACUTE ONLY): 35 min  Charges:  $Gait Training: 8-22 mins $Therapeutic Exercise: 8-22 mins                     Abran Richard, PT Acute Rehab Services Pager [phone] [facility] [place] 07/02/2020, 1:29 PM

## 2020-07-02 NOTE — Progress Notes (Signed)
Occupational Therapy Treatment Patient Details Name: Wesley Winters MRN: [id number] DOB: 1940/08/04 Today's Date: 07/02/2020    History of present illness Pt is 80 yo admitted 6/11 after 2 falls at home with fatigue and weakness.  Pt found to have afib with RVR, hypotensive, worsening kidney function and admitted for cardiogenic shock. 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 Participated in grooming seated in recliner with min assist and assisted back to bed with min assist, second person for lines and safety.Pt with decreased awareness of deficits and insight into skills needed for safe discharge home.   Follow Up Recommendations  SNF;Supervision/Assistance - 24 hour    Equipment Recommendations  3 in 1 bedside commode    Recommendations for Other Services      Precautions / Restrictions Precautions Precautions: Fall Precaution Comments: stool incontinence       Mobility Bed Mobility Overal bed mobility: Needs Assistance Bed Mobility: Sit to Supine    Sit to supine: Mod assist   General bed mobility comments: assist for LEs back into bed    Transfers Overall transfer level: Needs assistance Equipment used: Rolling walker (2 wheeled) Transfers: Sit to/from [facility] Sit to Stand: Min assist Stand pivot transfers: Min assist       General transfer comment: assist to rise and steady    Balance Overall balance assessment: Needs assistance Sitting-balance support: Bilateral upper extremity supported;Feet supported Sitting balance-Leahy Scale: Poor Sitting balance - Comments: posterior lean   Standing balance support: Bilateral upper extremity supported Standing balance-Leahy Scale: Poor Standing balance comment: RW and min A at times for support                           ADL either performed or assessed with clinical judgement   ADL Overall ADL's : Needs  assistance/impaired     Grooming: Oral care;Sitting;Minimal assistance Grooming Details (indicate cue type and reason): seated in recliner                                     Vision       Perception     Praxis      Cognition Arousal/Alertness: Awake/alert Behavior During Therapy: WFL for tasks assessed/performed Overall Cognitive Status: Impaired/Different from baseline Area of Impairment: Problem solving;Memory                     Memory: Decreased short-term memory   Safety/Judgement: Decreased awareness of safety   Problem Solving: Slow processing;Requires verbal cues General Comments: pt with decreased problem solving, requires verbal cueing        Exercises    Shoulder Instructions       General Comments     Pertinent Vitals/ Pain       Pain Assessment: No/denies pain  Home Living                                          Prior Functioning/Environment              Frequency  Min 2X/week        Progress Toward Goals  OT Goals(current goals can now be found in the care plan  section)  Progress towards OT goals: Progressing toward goals  Acute Rehab OT Goals Patient Stated Goal: I need to get a little stronger OT Goal Formulation: With patient Time For Goal Achievement: 07/04/20 Potential to Achieve Goals: Good  Plan Discharge plan remains appropriate;Frequency remains appropriate    Co-evaluation                 AM-PAC OT "6 Clicks" Daily Activity     Outcome Measure   Help from another person eating meals?: None Help from another person taking care of personal grooming?: A Little Help from another person toileting, which includes using toliet, bedpan, or urinal?: Total Help from another person bathing (including washing, rinsing, drying)?: A Lot Help from another person to put on and taking off regular upper body clothing?: A Little Help from another person to put on and taking off  regular lower body clothing?: Total 6 Click Score: 14    End of Session Equipment Utilized During Treatment: Rolling walker  OT Visit Diagnosis: Unsteadiness on feet (R26.81);Other abnormalities of gait and mobility (R26.89);Muscle weakness (generalized) (M62.81)   Activity Tolerance Patient limited by fatigue   Patient Left with call bell/phone within reach;in bed;with bed alarm set   Nurse Communication          Time: 0177-9390 OT Time Calculation (min): 21 min  Charges: OT General Charges $OT Visit: 1 Visit OT Treatments $Therapeutic Activity: 8-22 mins  Nestor Lewandowsky, OTR/L Acute Rehabilitation Services Pager: [phone] Office: [phone]    Malka So 07/02/2020, 4:36 PM

## 2020-07-02 NOTE — Progress Notes (Signed)
PROGRESS NOTE    Wesley Winters  TUU:[id number] DOB: 28-Aug-1940 DOA: 06/14/2020 PCP: [name], MD    Chief Complaint  Patient presents with   Weakness    Brief Narrative:   Patient is 80 year old male with history of severe systolic congestive heart failure with ejection fraction 25% suspected to be from tachycardia induced, A. fib, coronary artery disease status post stenting, diabetes type 2 who initially presented to the emergency room with complaints of poor appetite, weakness, fall.  On presentation he was hypotensive, found to have intermittent episodes of A. fib with RVR.  Lab work showed worsening kidney function with creatinine in the range of 3, elevated liver enzymes.  Patient was admitted for the management of cardiogenic shock.  Cardiology was primary team 06/24/2020, transferred to [facility] service on 06/25/2020.  On milrinone drip. Patient had to be transferred back to ICU and levophed was added to heart failure regimen   Assessment & Plan:   Principal Problem:   Cardiogenic shock ([place]) Active Problems:   Diabetes ([place])   CAD in native artery   Persistent atrial fibrillation (HCC)   Chronic anticoagulation   ARF (acute renal failure) (HCC)   PAF (paroxysmal atrial fibrillation) (HCC)   Pressure injury of skin   Acute on chronic systolic CHF: ECHO showed LVEF of 20 to 25%, weaned off levophed , currently on milrinone, along with torsemide and aldactone.  Cardiology/ HF team on board.    Paroxysmal atrial fib with RVR S/P tee/ DCCV on 6/17, currently in sinus rhythm.  On IV amiodarone and digoxin for rte control.  On eliquis for anti coagulation.     AKI:  SEC to cardiogenic shock/ ATN.  CREATININE improved to 1.3.    Elevated liver enzymes:  Congestive hepatopathy.    Type 2 DM:  Insulin dependent, continue with SSI.    Anemia of chronic disease:  Transfuse to keep hemoglobin greater than 7.   Acute urinary retention:  Resolved.    Left foot  wound/ Pressure injury.  Pressure Injury 06/25/20 Buttocks Left Stage 2 -  Partial thickness loss of dermis presenting as a shallow open injury with a red, pink wound bed without slough. 2.7 cm X 2.0 cm open stage 2, surrounding area is blanchable (Active)  06/25/20 0810  Location: Buttocks  Location Orientation: Left  Staging: Stage 2 -  Partial thickness loss of dermis presenting as a shallow open injury with a red, pink wound bed without slough.  Wound Description (Comments): 2.7 cm X 2.0 cm open stage 2, surrounding area is blanchable  Present on Admission: No     Pressure Injury 06/25/20 Buttocks Right Stage 2 -  Partial thickness loss of dermis presenting as a shallow open injury with a red, pink wound bed without slough. 2.3 cm X 1.0 cm stage 2, surrounding area is blanchable (Active)  06/25/20 0811  Location: Buttocks  Location Orientation: Right  Staging: Stage 2 -  Partial thickness loss of dermis presenting as a shallow open injury with a red, pink wound bed without slough.  Wound Description (Comments): 2.3 cm X 1.0 cm stage 2, surrounding area is blanchable  Present on Admission: No  Wound care consulted and recommendations given.    Hyponatremia:  Sec to fluid overload.    Body mass index is 33.92 kg/m. Obesity.             DVT prophylaxis: eliquis.  Code Status: (Full code) Family Communication: family at bedside.  Disposition:   Status  is: Inpatient  Remains inpatient appropriate because:IV treatments appropriate due to intensity of illness or inability to take PO  Dispo: The patient is from: Home              Anticipated d/c is to: Home              Patient currently is not medically stable to d/c.   Difficult to place patient No       Consultants:  Heart failure team.   Procedures: None.   Antimicrobials: none.    Subjective: No chest pain or sob.   Objective: Vitals:   07/01/20 2300 07/02/20 0354 07/02/20 0627 07/02/20 1108  BP:  (!) 112/57 (!) 109/55  (!) 107/59  Pulse: 73 68  68  Resp: 17 19  14   Temp: 97.8 F (36.6 C) 98.8 F (37.1 C)  98.2 F (36.8 C)  TempSrc: Oral Oral    SpO2: 99% 100%  98%  Weight:   101.2 kg   Height:        Intake/Output Summary (Last 24 hours) at 07/02/2020 1111 Last data filed at 07/02/2020 0430 Gross per 24 hour  Intake 743.78 ml  Output 300 ml  Net 443.78 ml   Filed Weights   06/30/20 0430 07/01/20 0324 07/02/20 0627  Weight: 98.6 kg 98.4 kg 101.2 kg    Examination:  General exam: Appears calm and comfortable  Respiratory system: Clear to auscultation. Respiratory effort normal. Cardiovascular system: S1 & S2 heard, RRR. No JVD,  No pedal edema. Gastrointestinal system: Abdomen is nondistended, soft and nontender. Normal bowel sounds heard. Central nervous system: Alert and oriented. No focal neurological deficits. Extremities: Symmetric 5 x 5 power. Skin: No rashes, lesions or ulcers Psychiatry: Mood & affect appropriate.     Data Reviewed: I have personally reviewed following labs and imaging studies  CBC: Recent Labs  Lab 06/26/20 0514 06/27/20 0511 06/28/20 0432 07/02/20 0417  WBC 12.7* 11.9* 10.4 8.7  HGB 8.7* 8.8* 8.6* 8.2*  HCT 29.0* 30.3* 28.8* 26.9*  MCV 77.7* 78.5* 78.0* 77.1*  PLT 267 289 258 448    Basic Metabolic Panel: Recent Labs  Lab 06/28/20 0432 06/29/20 0422 06/29/20 1230 06/30/20 0431 06/30/20 0908 07/01/20 0409 07/02/20 0417  NA 126* 126*  --   --  130* 127* 128*  K 4.4 3.2* 3.6  --  3.5 3.7 3.7  CL 91* 88*  --   --  90* 89* 92*  CO2 27 28  --   --  31 30 30   GLUCOSE 232* 288*  --   --  146* 165* 109*  BUN 12 11  --   --  13 15 15   CREATININE 1.03 1.11  --   --  1.39* 1.34* 1.28*  CALCIUM 7.4* 7.1*  --   --  7.8* 7.8* 7.5*  MG 2.1 1.5* 3.9* 2.1  --  1.5* 3.3*  PHOS  --   --   --   --   --  3.3  --     GFR: Estimated Creatinine Clearance: 51.3 mL/min (A) (by C-G formula based on SCr of 1.28 mg/dL (H)).  Liver  Function Tests: Recent Labs  Lab 07/01/20 0409  ALBUMIN 1.8*    CBG: Recent Labs  Lab 07/01/20 0745 07/01/20 1122 07/01/20 1644 07/01/20 2123 07/02/20 0610  GLUCAP 183* 138* 101* 158* 109*     No results found for this or any previous visit (from the past 240 hour(s)).  Radiology Studies: No results found.      Scheduled Meds:  apixaban  5 mg Oral BID   atorvastatin  40 mg Oral Daily   Chlorhexidine Gluconate Cloth  6 each Topical Q0600   digoxin  0.0625 mg Oral Daily   docusate sodium  100 mg Oral Daily   insulin aspart  0-15 Units Subcutaneous TID WC   insulin aspart  0-5 Units Subcutaneous QHS   insulin glargine  10 Units Subcutaneous QHS   midodrine  5 mg Oral TID WC   mupirocin ointment   Topical Q12H   ranolazine  500 mg Oral BID   senna  1 tablet Oral Daily   sodium chloride flush  10-40 mL Intracatheter Q12H   spironolactone  12.5 mg Oral Daily   tamsulosin  0.4 mg Oral QPC supper   torsemide  20 mg Oral Daily   vitamin B-12  1,000 mcg Oral QHS   Continuous Infusions:  sodium chloride Stopped (06/23/20 0817)   sodium chloride Stopped (06/30/20 0429)   amiodarone 30 mg/hr (07/02/20 0719)   ferumoxytol     milrinone 0.25 mcg/kg/min (07/02/20 0430)     LOS: 18 days        Hosie Poisson, MD [facility]   To contact the attending provider between 7A-7P or the covering provider during after hours 7P-7A, please log into the web site [email or web address] and access using universal [place] password for that web site. If you do not have the password, please call the hospital operator.  07/02/2020, 11:11 AM

## 2020-07-02 NOTE — Progress Notes (Signed)
Notified by CCMD that patient appeared to be in accelerated junctional rhythm.  12 lead EKG obtained, shows NSR.

## 2020-07-02 NOTE — TOC Progression Note (Signed)
Transition of Care [facility]) - Progression Note    Patient Details  Name: KANNEN MOXEY MRN: [id number] Date of Birth: 10/17/1940  Transition of Care [facility]) CM/SW Contact  Zenon Mayo, RN Phone Number: 07/02/2020, 12:36 PM  Clinical Narrative:    NCM notified by Carolynn Sayers, she received information from HF team  for possible milrinone and patient is for possible SNF.   Expected Discharge Plan: [place] Barriers to Discharge: Continued Medical Work up  Expected Discharge Plan and Services Expected Discharge Plan: [place] In-house Referral: Clinical Social Work Discharge Planning Services: CM Consult   Living arrangements for the past 2 months: Single Family Home                                       Social Determinants of Health (SDOH) Interventions    Readmission Risk Interventions No flowsheet data found.

## 2020-07-02 NOTE — Progress Notes (Signed)
Patient self administered CPAP.  Tolerating well.

## 2020-07-02 NOTE — Progress Notes (Addendum)
Advanced Heart Failure Rounding Note  PCP-Cardiologist: Sinclair Grooms, MD   Subjective:   Cardiogenic shock. New onset AF.   6/12 CO-OX 43% --->Started on milrinone 0.25 mcg.  6/13 Milrinone increased to 0.375. Co-ox 58% today.  6/15 Back in Afib w/ RVR, amio rebolused, rate increased to 60  6/15 Fever spike + leukocytosis + hypotension>>started on empiric cefepime + NE. BCx with proteus 6/17 TEE/DC-CV briefly in SR 6/19 Back in AF. Milrinone cut back to 0.25 mcg.  6/20 Developed petechiae R fingers.  6/21  Milrinone increased to 0.375 and NE 3 added. 6/24 Attempted to wean milrinone to 0.25 and NE to 2 -> co-ox dropped 48%-> 33% 6/26 NE stopped, milrinone reduced to 0.25   Co-ox lower today w/ milrinone wean, 58>>51%.   Maintaining NSR.   Scr stable at 1.28  Na 128   Doubt wt is accurate, charted 7 lb gain. Was moved to different unit yesterday. CVP 7   Objective:   Weight Range: 101.2 kg Body mass index is 33.92 kg/m.   Vital Signs:   Temp:  [97.8 F (36.6 C)-98.8 F (37.1 C)] 98.8 F (37.1 C) (06/29 0354) Pulse Rate:  [68-84] 68 (06/29 0354) Resp:  [16-20] 19 (06/29 0354) BP: (109-128)/(55-73) 109/55 (06/29 0354) SpO2:  [99 %-100 %] 100 % (06/29 0354) Weight:  [101.2 kg] 101.2 kg (06/29 0627) Last BM Date: 07/01/20  Weight change: Filed Weights   06/30/20 0430 07/01/20 0324 07/02/20 0627  Weight: 98.6 kg 98.4 kg 101.2 kg    Intake/Output:   Intake/Output Summary (Last 24 hours) at 07/02/2020 0914 Last data filed at 07/02/2020 0430 Gross per 24 hour  Intake 863.78 ml  Output 300 ml  Net 563.78 ml     Physical Exam   CVP 7  General:  Well appearing elderly WM, obese. No respiratory difficulty HEENT: normal Neck: supple. JVD 7 cm. Carotids 2+ bilat; no bruits. No lymphadenopathy or thyromegaly appreciated. Cor: PMI nondisplaced. Regular rate & rhythm. No rubs, gallops or murmurs. Lungs: clear Abdomen: soft, nontender, nondistended. No  hepatosplenomegaly. No bruits or masses. Good bowel sounds. Extremities: no cyanosis, clubbing, rash, edema Neuro: alert & oriented x 3, cranial nerves grossly intact. moves all 4 extremities w/o difficulty. Affect pleasant.   Telemetry    NSR 70s Personally reviewed   Labs    CBC Recent Labs    07/02/20 0417  WBC 8.7  HGB 8.2*  HCT 26.9*  MCV 77.1*  PLT 831   Basic Metabolic Panel Recent Labs    07/01/20 0409 07/02/20 0417  NA 127* 128*  K 3.7 3.7  CL 89* 92*  CO2 30 30  GLUCOSE 165* 109*  BUN 15 15  CREATININE 1.34* 1.28*  CALCIUM 7.8* 7.5*  MG 1.5* 3.3*  PHOS 3.3  --    Liver Function Tests Recent Labs    07/01/20 0409  ALBUMIN 1.8*    No results for input(s): LIPASE, AMYLASE in the last 72 hours. Cardiac Enzymes No results for input(s): CKTOTAL, CKMB, CKMBINDEX, TROPONINI in the last 72 hours.  BNP: BNP (last 3 results) Recent Labs    05/26/20 1429 06/15/20 0729  BNP 1,609.8* 2,424.4*    ProBNP (last 3 results) Recent Labs    07/11/19 1107 04/23/20 1446  PROBNP 445 2,488*     D-Dimer No results for input(s): DDIMER in the last 72 hours. Hemoglobin A1C No results for input(s): HGBA1C in the last 72 hours. Fasting Lipid Panel No results for  input(s): CHOL, HDL, LDLCALC, TRIG, CHOLHDL, LDLDIRECT in the last 72 hours. Thyroid Function Tests No results for input(s): TSH, T4TOTAL, T3FREE, THYROIDAB in the last 72 hours.  Invalid input(s): FREET3   Other results:   Imaging    No results found.   Medications:     Scheduled Medications:  apixaban  5 mg Oral BID   atorvastatin  40 mg Oral Daily   Chlorhexidine Gluconate Cloth  6 each Topical Q0600   digoxin  0.0625 mg Oral Daily   docusate sodium  100 mg Oral Daily   insulin aspart  0-15 Units Subcutaneous TID WC   insulin aspart  0-5 Units Subcutaneous QHS   insulin glargine  10 Units Subcutaneous QHS   midodrine  5 mg Oral TID WC   mupirocin ointment   Topical Q12H    ranolazine  500 mg Oral BID   senna  1 tablet Oral Daily   sodium chloride flush  10-40 mL Intracatheter Q12H   spironolactone  12.5 mg Oral Daily   tamsulosin  0.4 mg Oral QPC supper   torsemide  20 mg Oral Daily   vitamin B-12  1,000 mcg Oral QHS    Infusions:  sodium chloride Stopped (06/23/20 0817)   sodium chloride Stopped (06/30/20 0429)   amiodarone 30 mg/hr (07/02/20 0719)   milrinone 0.25 mcg/kg/min (07/02/20 0430)   norepinephrine (LEVOPHED) Adult infusion Stopped (07/01/20 1600)    PRN Medications:   Assessment/Plan    Acute systolic HF -> 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 Lactate 9.9  - doubt ischemic as hstrop low despite profound hemodynamic support but given h/p CAD and multiple CRFs will need ischemic eval once more stable and renal function improves - Converted to NSR. HR controlled - Weaning milrinone, on 0.25. Co-ox 58>>51% - Repeat co-ox again at noon. If ok, will wean milrinone to 0.125 - Continue to follow co-ox. If he fails wean, could consider home milrinone  - CVP 7 Continue torsemide 20 mg daily  - Continue digoxin  - Continue spiro 12.5 - Off GDMT due to need for inotropes  2. CAD - s/p previous LAD stenting last cath 2005 with patent coronaries - doubt ischemia is major factor in HF given low hsTrop however with inability to wean inotropes will consider R/L cath early next week - continue ASA - Continue lipitor 40 mg daily.    3. AF with RVR - Eliquis stopped 6/12 and switched to heparin.  - TEE/DCCV on 6/17-> NSR then went back to AF - Back in NSR - Continue IV amio, 30/hr  - Back on Eliquis  - EP consulted. Not currently a candidate for ablation or CRT-P with wounds.   4.  AKI - due to ATN and shock - peak creatine 2.9 - Resolved. SCr 1.3 and stable    5. Shock liver - resolved   6. DM2 - SSI    7. Hypokalemia  - resolved, K 3.7   8. Sepsis/GN Bacteremia  - PCT 2.7. CXR w/o infiltrate. Foley replaced . BCx Proteus Species.  - Abx narrowed from cefepime to ceftriaxone per ID. Has completed 7 days - sepsis resolved  9. Urinary retention - failed Foley removal - continue Flomax   10. Wound on left foot - deep but clean based - WC recs bid dressing changes  11. Hyponatremia - 6/18 and 6/22 tolvaptan, mentating ok  -  Na 128 today - fluid restrict and monitor   12. Petechial rash on R hand/L hand /R foot  - ? Cholesterol emboli. - neurovascular intact. No wound - follow   13. Code status - Full Code - Palliative following for goals of care.   14. Deconditioning - PT/OT following, currently recommend SNF  Length of Stay: [address], PA-C  07/02/2020, 9:14 AM  Advanced Heart Failure Team Pager [phone] (M-F; 7a - 5p)  Please contact [facility] for night-coverage after hours (5p -7a ) and weekends on [email or web address]  Patient seen and examined with the above-signed Advanced Practice Provider and/or Housestaff. I personally reviewed laboratory data, imaging studies and relevant notes. I independently examined the patient and formulated the important aspects of the plan. I have edited the note to reflect any of my changes or salient points. I have personally discussed the plan with the patient and/or family.  Remains in NSR. Feels weak. Denies SOB. Milrinone dropped to 0.25 and co-ox now down to 44% CVP 7   General:  Weak appearing. No resp difficulty HEENT: normal Neck: supple. JVP 7  Carotids 2+ bilat; no bruits. No lymphadenopathy or thryomegaly appreciated. Cor: PMI nondisplaced. Regular rate & rhythm. No rubs, gallops or murmurs. Lungs: clear Abdomen: obese soft, nontender, nondistended. No hepatosplenomegaly. No bruits or masses. Good bowel sounds. Extremities: no cyanosis, clubbing, rash,  edema Neuro: alert & orientedx3, cranial nerves grossly intact. moves all 4 extremities w/o difficulty. Affect pleasant  He is failing milrinone wean. Will increase milrinone to 0.375. Will need to discuss with family regarding if he wants to proceed with trial of home milrinone to give his heart more time to recover or wants to switch to Palliative care. Can consider coronary angio but would have to stop Eliquis and switch to heparin.   Glori Bickers, MD  4:09 PM

## 2020-07-03 LAB — BASIC METABOLIC PANEL
Anion gap: 9 (ref 5–15)
BUN: 20 mg/dL (ref 8–23)
CO2: 29 mmol/L (ref 22–32)
Calcium: 7.6 mg/dL — ABNORMAL LOW (ref 8.9–10.3)
Chloride: 90 mmol/L — ABNORMAL LOW (ref 98–111)
Creatinine, Ser: 1.56 mg/dL — ABNORMAL HIGH (ref 0.61–1.24)
GFR, Estimated: 44 mL/min — ABNORMAL LOW (ref >60)
Glucose, Bld: 111 mg/dL — ABNORMAL HIGH (ref 70–99)
Potassium: 3.8 mmol/L (ref 3.5–5.1)
Sodium: 128 mmol/L — ABNORMAL LOW (ref 135–145)

## 2020-07-03 LAB — GLUCOSE, CAPILLARY
Glucose-Capillary: 114 mg/dL — ABNORMAL HIGH (ref 70–99)
Glucose-Capillary: 172 mg/dL — ABNORMAL HIGH (ref 70–99)
Glucose-Capillary: 93 mg/dL (ref 70–99)
Glucose-Capillary: 121 mg/dL — ABNORMAL HIGH (ref 70–99)

## 2020-07-03 LAB — MAGNESIUM: Magnesium: 1.7 mg/dL (ref 1.7–2.4)

## 2020-07-03 LAB — COOXEMETRY PANEL
Carboxyhemoglobin: 1.7 % — ABNORMAL HIGH (ref 0.5–1.5)
Methemoglobin: 1.3 % (ref 0.0–1.5)
O2 Saturation: 71.1 %
Total hemoglobin: 8.2 g/dL — ABNORMAL LOW (ref 12.0–16.0)

## 2020-07-03 MED ORDER — AMIODARONE HCL 200 MG PO TABS
200.0000 mg | ORAL_TABLET | Freq: Two times a day (BID) | ORAL | Status: DC
  Administered 2020-07-03 – 2020-07-06 (×8): 200 mg via ORAL
  Filled 2020-07-03 (×8): qty 1

## 2020-07-03 MED ORDER — MAGNESIUM SULFATE 2 GM/50ML IV SOLN
2.0000 g | Freq: Once | INTRAVENOUS | Status: AC
  Administered 2020-07-03: 2 g via INTRAVENOUS
  Filled 2020-07-03: qty 50

## 2020-07-03 NOTE — Progress Notes (Signed)
Patient placed self on CPAP.  RT assistance not needed at this time.

## 2020-07-03 NOTE — TOC Progression Note (Addendum)
Transition of Care (TOC) - Progression Note  Heart Failure   Patient Details  Name: Wesley Winters MRN: [id number] Date of Birth: Nov 18, 1940  Transition of Care [facility]) CM/SW [place], [place] Phone Number: 07/03/2020, 11:03 AM  Clinical Narrative:    CSW received a message from the palliative nurse to speak over the phone. CSW reached out to palliative and discussed next steps for patient's disposition. Apparently the patient's spouse is anxious to get the patient home and the patient's daughter wants the patient to go to a SNF for rehab however Mr. Trompeter is still on milrinone and not many SNF's take patients on IV milrinone. CSW reached out to [facility] and they do not take patients on milrinone and neither does [facility], SNF. [facility] does take patient's on milrinone but doesn't have any beds available and [facility] did not answer the phone and CSW had to leave a voicemail for them to return the call. CSW will continue to [facility] and see if any other SNF's take patients on milrinone.  CSW informed palliative of the update. CSW attempted to speak with the patient's spouse but she didn't answer the phone. CSW was able to speak with the patient's daughter, Alyse Low over the phone to update her about the milirnone being a barrier for SNF and she asked about CIR as an option for rehab and CSW informed her that she will reach out to CIR and ask and will update her. Christy asked about the social worker coming by to the room today and CSW informed her about working from home today but will come by the patient's room tomorrow and that the [place] attempted to reach out to her mom over the phone but left a voicemail for her to return the call.   CIR reported that the patient would be unable to tolerate the intensity required of CIR admit 3 hours of rehab per day nor would the patients insurance approve for CIR. CSW informed the patient's daughter, Alyse Low of this information who reported  understanding.  CSW did hear back from [facility] who reported they do not take patients on milrinone at this time.  Palliative to follow up with the patient and his family and they are aware CSW is available for support. Per palliative family wants to wait on bed availability at [facility]. CSW made [facility] aware and sent over clinicals. [facility] reported to check back on Tuesday for bed availability. IV milrinone is a barrier to discharge to a SNF.   CSW will continue to follow throughout discharge.  Expected Discharge Plan: [place] Barriers to Discharge: Continued Medical Work up  Expected Discharge Plan and Services Expected Discharge Plan: [place] In-house Referral: Clinical Social Work Discharge Planning Services: CM Consult   Living arrangements for the past 2 months: Single Family Home                                       Social Determinants of Health (SDOH) Interventions    Readmission Risk Interventions No flowsheet data found.  Wesley Winters, MSW, [place] Heart Failure Social Worker

## 2020-07-03 NOTE — TOC Progression Note (Signed)
Transition of Care [facility]) - Progression Note    Patient Details  Name: Wesley Winters MRN: [id number] Date of Birth: 1940/07/09  Transition of Care [facility]) CM/SW Contact  Zenon Mayo, RN Phone Number: 07/03/2020, 2:28 PM  Clinical Narrative:    Per CSW note [facility] is the only SNF willing to take the milrinone and they do not have any beds available, CSW will check back on Tuesday.  NCM notified Carolynn Sayers of this information concerning the Milrinone medication.   Expected Discharge Plan: [place] Barriers to Discharge: Continued Medical Work up  Expected Discharge Plan and Services Expected Discharge Plan: [place] In-house Referral: Clinical Social Work Discharge Planning Services: CM Consult   Living arrangements for the past 2 months: Single Family Home                                       Social Determinants of Health (SDOH) Interventions    Readmission Risk Interventions No flowsheet data found.

## 2020-07-03 NOTE — Plan of Care (Signed)

## 2020-07-03 NOTE — Progress Notes (Signed)
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 fib, coronary artery disease status post stenting, diabetes type 2 who initially presented to the emergency room with complaints of poor appetite, weakness, fall.  On presentation he was hypotensive, found to have intermittent episodes of A. fib with RVR.  Lab work showed worsening kidney function with creatinine in the range of 3, elevated liver enzymes.  Patient was admitted for the management of cardiogenic shock.  Cardiology was primary team 06/24/2020, transferred to [facility] service on 06/25/2020.  On milrinone drip. Patient had to be transferred back to ICU and levophed was added to heart failure regimen. Pt seen and examined at bedside. Pt had an episode of chest pain yesterday, with some sob.     Assessment & Plan:   Principal Problem:   Cardiogenic shock ([place]) Active Problems:   Diabetes ([place])   CAD in native artery   Persistent atrial fibrillation (HCC)   Chronic anticoagulation   ARF (acute renal failure) (HCC)   PAF (paroxysmal atrial fibrillation) (HCC)   Pressure injury of skin   Acute on chronic systolic CHF: ECHO showed LVEF of 20 to 25%, weaned off levophed , currently on milrinone, along with torsemide 20 mg daily and aldactone 12.5 mg daily.  Diuresed about 7 lit since admission.  Continue with strict intake and output.  Daily weights.  Creatinine worsened to 1.56 today.  Cardiology/ HF team on board.    Paroxysmal atrial fib with RVR S/P tee/ DCCV on 6/17, currently in sinus rhythm.  On IV amiodarone and digoxin for rate control.  On eliquis for anti coagulation.     AKI:  SEC to cardiogenic shock/ ATN.  Slight worsening of  creatinine to 1.56.    Elevated liver enzymes:  Congestive hepatopathy.  Continue to monitor.    Type 2 DM:  Insulin dependent, continue with SSI.  CBG (last 3)  Recent Labs    07/02/20 1541 07/02/20 2111 07/03/20 0601  GLUCAP 164* 134* 114*   Resume lantus and SSI.    Anemia of chronic disease:  Transfuse to keep hemoglobin greater than 7. Hemoglobin of 8.2.    Acute urinary retention:  Resolved.    Left foot wound/ Pressure injury.  Pressure Injury 06/25/20 Buttocks Left Stage 2 -  Partial thickness loss of dermis presenting as a shallow open injury with a red, pink wound bed without slough. 2.7 cm X 2.0 cm open stage 2, surrounding area is blanchable (Active)  06/25/20 0810  Location: Buttocks  Location Orientation: Left  Staging: Stage 2 -  Partial thickness loss of dermis presenting as a shallow open injury with a red, pink wound bed without slough.  Wound Description (Comments): 2.7 cm X 2.0 cm open stage 2, surrounding area is blanchable  Present on Admission: No     Pressure Injury 06/25/20 Buttocks Right Stage 2 -  Partial thickness loss of dermis presenting as a shallow open injury with a red, pink wound bed without slough. 2.3 cm X 1.0 cm stage 2, surrounding area is blanchable (Active)  06/25/20 0811  Location: Buttocks  Location Orientation: Right  Staging: Stage 2 -  Partial thickness loss of dermis presenting as a shallow open  injury with a red, pink wound bed without slough.  Wound Description (Comments): 2.3 cm X 1.0 cm stage 2, surrounding area is blanchable  Present on Admission: No  Wound care consulted and recommendations given.    Hyponatremia:  Sec to fluid overload.  Sodium of 128.    Body mass index is 33.39 kg/m. Obesity.    GERD:  Will start him protonix.   DVT prophylaxis: eliquis.  Code Status: (Full code) Family Communication: family at bedside.  Disposition:   Status is: Inpatient  Remains inpatient appropriate  because:IV treatments appropriate due to intensity of illness or inability to take PO  Dispo: The patient is from: Home              Anticipated d/c is to: Home              Patient currently is not medically stable to d/c.   Difficult to place patient No       Consultants:  Heart failure team.   Procedures: None.   Antimicrobials: none.    Subjective: No chest pain or sob this morning.  Objective: Vitals:   07/02/20 2342 07/03/20 0347 07/03/20 0618 07/03/20 0929  BP:  (!) 102/53  (!) 109/58  Pulse: 69 63  62  Resp: (!) 22 20  19   Temp:  97.6 F (36.4 C)  97.8 F (36.6 C)  TempSrc:  Oral  Oral  SpO2:  99%  97%  Weight:   99.6 kg   Height:        Intake/Output Summary (Last 24 hours) at 07/03/2020 1056 Last data filed at 07/03/2020 0500 Gross per 24 hour  Intake 752.29 ml  Output --  Net 752.29 ml    Filed Weights   07/01/20 0324 07/02/20 0627 07/03/20 0618  Weight: 98.4 kg 101.2 kg 99.6 kg    Examination:  General exam: alert and comfortable on RA.  Respiratory system: Diminished air entry at bases, no wheezing or rhonchi Cardiovascular system: S1-S2 heard, regular rate rhythm, improving pedal edema Gastrointestinal system: Abdomen is soft nontender bowel sounds normal alert ambulatory Central nervous system: Alert and oriented. No focal neurological deficits. Extremities: No cyanosis Skin: Pressure injury in the back Psychiatry: Mood is appropriate    Data Reviewed: I have personally reviewed following labs and imaging studies  CBC: Recent Labs  Lab 06/27/20 0511 06/28/20 0432 07/02/20 0417  WBC 11.9* 10.4 8.7  HGB 8.8* 8.6* 8.2*  HCT 30.3* 28.8* 26.9*  MCV 78.5* 78.0* 77.1*  PLT 289 258 233     Basic Metabolic Panel: Recent Labs  Lab 06/29/20 0422 06/29/20 1230 06/30/20 0431 06/30/20 0908 07/01/20 0409 07/02/20 0417 07/03/20 0459  NA 126*  --   --  130* 127* 128* 128*  K 3.2* 3.6  --  3.5 3.7 3.7 3.8  CL 88*  --   --  90* 89*  92* 90*  CO2 28  --   --  31 30 30 29   GLUCOSE 288*  --   --  146* 165* 109* 111*  BUN 11  --   --  13 15 15 20   CREATININE 1.11  --   --  1.39* 1.34* 1.28* 1.56*  CALCIUM 7.1*  --   --  7.8* 7.8* 7.5* 7.6*  MG 1.5* 3.9* 2.1  --  1.5* 3.3* 1.7  PHOS  --   --   --   --  3.3  --   --      GFR: Estimated  Creatinine Clearance: 41.8 mL/min (A) (by C-G formula based on SCr of 1.56 mg/dL (H)).  Liver Function Tests: Recent Labs  Lab 07/01/20 0409  ALBUMIN 1.8*     CBG: Recent Labs  Lab 07/02/20 0610 07/02/20 1110 07/02/20 1541 07/02/20 2111 07/03/20 0601  GLUCAP 109* 200* 164* 134* 114*      No results found for this or any previous visit (from the past 240 hour(s)).       Radiology Studies: No results found.      Scheduled Meds:  apixaban  5 mg Oral BID   atorvastatin  40 mg Oral Daily   Chlorhexidine Gluconate Cloth  6 each Topical Q0600   digoxin  0.0625 mg Oral Daily   docusate sodium  100 mg Oral Daily   insulin aspart  0-15 Units Subcutaneous TID WC   insulin aspart  0-5 Units Subcutaneous QHS   insulin glargine  10 Units Subcutaneous QHS   midodrine  5 mg Oral TID WC   mupirocin ointment   Topical Q12H   ranolazine  500 mg Oral BID   senna  1 tablet Oral Daily   sodium chloride flush  10-40 mL Intracatheter Q12H   spironolactone  12.5 mg Oral Daily   tamsulosin  0.4 mg Oral QPC supper   torsemide  20 mg Oral Daily   vitamin B-12  1,000 mcg Oral QHS   Continuous Infusions:  sodium chloride Stopped (06/23/20 0817)   sodium chloride Stopped (06/30/20 0429)   amiodarone 30 mg/hr (07/03/20 0510)   ferumoxytol 510 mg (07/02/20 1429)   milrinone 0.375 mcg/kg/min (07/03/20 0809)     LOS: 19 days        Hosie Poisson, MD [facility]   To contact the attending provider between 7A-7P or the covering provider during after hours 7P-7A, please log into the web site [email or web address] and access using universal [place] password for that web  site. If you do not have the password, please call the hospital operator.  07/03/2020, 10:56 AM

## 2020-07-03 NOTE — Progress Notes (Addendum)
Advanced Heart Failure Rounding Note  PCP-Cardiologist: Sinclair Grooms, MD   Subjective:   Cardiogenic shock. New onset AF.   6/12 CO-OX 43% --->Started on milrinone 0.25 mcg.  6/13 Milrinone increased to 0.375. Co-ox 58% today.  6/15 Back in Afib w/ RVR, amio rebolused, rate increased to 60  6/15 Fever spike + leukocytosis + hypotension>>started on empiric cefepime + NE. BCx with proteus 6/17 TEE/DC-CV briefly in SR 6/19 Back in AF. Milrinone cut back to 0.25 mcg.  6/20 Developed petechiae R fingers.  6/21  Milrinone increased to 0.375 and NE 3 added. 6/24 Attempted to wean milrinone to 0.25 and NE to 2 -> co-ox dropped 48%-> 33% 6/26 NE stopped, milrinone reduced to 0.25  6/29 Failed milrinone wean. CO-OX dropped. Milrinone increased to 0.375 mcg.   CO-OX 71% on milrinone 0.375 mcg.    Creatinine trending up 1.3>1.6  Na 128   Complaining of fatigue. Complaining of incontinence. Not sure he wants home milrinone.    Objective:   Weight Range: 99.6 kg Body mass index is 33.39 kg/m.   Vital Signs:   Temp:  [97.6 F (36.4 C)-98.6 F (37 C)] 97.6 F (36.4 C) (06/30 0347) Pulse Rate:  [58-69] 63 (06/30 0347) Resp:  [14-22] 20 (06/30 0347) BP: (89-112)/(53-77) 102/53 (06/30 0347) SpO2:  [95 %-100 %] 99 % (06/30 0347) Weight:  [99.6 kg] 99.6 kg (06/30 0618) Last BM Date: 07/02/20  Weight change: Filed Weights   07/01/20 0324 07/02/20 0627 07/03/20 0618  Weight: 98.4 kg 101.2 kg 99.6 kg    Intake/Output:   Intake/Output Summary (Last 24 hours) at 07/03/2020 0652 Last data filed at 07/03/2020 0500 Gross per 24 hour  Intake 752.29 ml  Output --  Net 752.29 ml     Physical Exam  General:  No resp difficulty HEENT: normal Neck: supple. JVP 7-8 . Carotids 2+ bilat; no bruits. No lymphadenopathy or thryomegaly appreciated. Cor: PMI nondisplaced. Regular rate & rhythm. No rubs, gallops or murmurs. Lungs: clear Abdomen: soft, nontender, nondistended. No  hepatosplenomegaly. No bruits or masses. Good bowel sounds. Extremities: no cyanosis, clubbing, rash, 1+ lower extremity edema.  LUE PICC Neuro: alert & orientedx3, cranial nerves grossly intact. moves all 4 extremities w/o difficulty. Affect pleasant    Telemetry   NSR 60-70s   Labs    CBC Recent Labs    07/02/20 0417  WBC 8.7  HGB 8.2*  HCT 26.9*  MCV 77.1*  PLT 485   Basic Metabolic Panel Recent Labs    07/01/20 0409 07/02/20 0417 07/03/20 0459  NA 127* 128* 128*  K 3.7 3.7 3.8  CL 89* 92* 90*  CO2 30 30 29   GLUCOSE 165* 109* 111*  BUN 15 15 20   CREATININE 1.34* 1.28* 1.56*  CALCIUM 7.8* 7.5* 7.6*  MG 1.5* 3.3* 1.7  PHOS 3.3  --   --    Liver Function Tests Recent Labs    07/01/20 0409  ALBUMIN 1.8*    No results for input(s): LIPASE, AMYLASE in the last 72 hours. Cardiac Enzymes No results for input(s): CKTOTAL, CKMB, CKMBINDEX, TROPONINI in the last 72 hours.  BNP: BNP (last 3 results) Recent Labs    05/26/20 1429 06/15/20 0729  BNP 1,609.8* 2,424.4*    ProBNP (last 3 results) Recent Labs    07/11/19 1107 04/23/20 1446  PROBNP 445 2,488*     D-Dimer No results for input(s): DDIMER in the last 72 hours. Hemoglobin A1C No results for input(s): HGBA1C  in the last 72 hours. Fasting Lipid Panel No results for input(s): CHOL, HDL, LDLCALC, TRIG, CHOLHDL, LDLDIRECT in the last 72 hours. Thyroid Function Tests No results for input(s): TSH, T4TOTAL, T3FREE, THYROIDAB in the last 72 hours.  Invalid input(s): FREET3   Other results:   Imaging    No results found.   Medications:     Scheduled Medications:  apixaban  5 mg Oral BID   atorvastatin  40 mg Oral Daily   Chlorhexidine Gluconate Cloth  6 each Topical Q0600   digoxin  0.0625 mg Oral Daily   docusate sodium  100 mg Oral Daily   insulin aspart  0-15 Units Subcutaneous TID WC   insulin aspart  0-5 Units Subcutaneous QHS   insulin glargine  10 Units Subcutaneous QHS    midodrine  5 mg Oral TID WC   mupirocin ointment   Topical Q12H   ranolazine  500 mg Oral BID   senna  1 tablet Oral Daily   sodium chloride flush  10-40 mL Intracatheter Q12H   spironolactone  12.5 mg Oral Daily   tamsulosin  0.4 mg Oral QPC supper   torsemide  20 mg Oral Daily   vitamin B-12  1,000 mcg Oral QHS    Infusions:  sodium chloride Stopped (06/23/20 0817)   sodium chloride Stopped (06/30/20 0429)   amiodarone 30 mg/hr (07/03/20 0510)   ferumoxytol 510 mg (07/02/20 1429)   milrinone 0.375 mcg/kg/min (07/03/20 0500)    PRN Medications:   Assessment/Plan    Acute systolic HF -> 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 Lactate 9.9  - doubt ischemic as hstrop low despite profound hemodynamic support but given h/p CAD and multiple CRFs will need ischemic eval once more stable and renal function improves - Converted to NSR. HR controlled - Failed milrinone wean. CO-OX dropped so milrinone increased to 0.375 mcg---> CO-OX 71%.  - CVP not hooked up. Waiting on new tubing.  - Volume status appears stable. Continue torsemide 20 mg daily  - Continue digoxin , dig level 0.6 on 06/29/20 - Continue spiro 12.5 - Off GDMT due to need for inotropes  2. CAD - s/p previous LAD stenting last cath 2005 with patent coronaries - doubt ischemia is major factor in HF given low hsTrop however with inability to wean inotropes will consider R/L cath early next week.  - No chest pain.  - continue ASA - Continue lipitor 40 mg daily.    3. AF with RVR - Eliquis stopped 6/12 and switched to heparin.  - TEE/DCCV on 6/17-> NSR then went back to AF - Remains in NSR.  - Continue IV amio, 30/hr  - Back on Eliquis  - EP consulted. Not currently a candidate for ablation or CRT-P with wounds.   4.  AKI - due to ATN and  shock - peak creatine 2.9 - Creatinine trending up 1.3>1.6.    5. Shock liver - resolved   6. DM2 - SSI    7. Hypokalemia  - resolved, K 3.8  8. Sepsis/GN Bacteremia  - PCT 2.7. CXR w/o infiltrate. Foley replaced . BCx Proteus Species.  - Abx narrowed from cefepime to ceftriaxone per ID. Has completed 7 days - sepsis resolved  9. Urinary retention - failed Foley removal - continue Flomax   10. Wound on  left foot - deep but clean based - WC recs bid dressing changes   11. Hyponatremia - 6/18 and 6/22 tolvaptan, mentating ok  -  Na 128 today - fluid restrict and monitor   12. Petechial rash on R hand/L hand /R foot  - ? Cholesterol emboli. - neurovascular intact. No wound - follow   13. Code status - Full Code - Palliative following for goals of care.   14. Deconditioning - PT/OT following, currently recommend SNF - May need continuous IV milrinone which will make placement options limited.   15. Hypomagnesia -Mag 1.7 . Give 2 grams now.   -Check Mag in am.    CVP set up pending. Will ask Palliative to follow up. I discussed that he may need home inotropes and this would be for quality of life. He is not sure he can handle this at home.   Length of Stay: [place], NP  07/03/2020, 6:52 AM  Advanced Heart Failure Team Pager [phone] (M-F; 7a - 5p)  Please contact [facility] for night-coverage after hours (5p -7a ) and weekends on [email or web address]  Patient seen and examined with the above-signed Advanced Practice Provider and/or Housestaff. I personally reviewed laboratory data, imaging studies and relevant notes. I independently examined the patient and formulated the important aspects of the plan. I have edited the note to reflect any of my changes or salient points. I have personally discussed the plan with the patient and/or family.  Failed milrinone wean. Now back up to 0.375. Feels weak. Maintaining NSR. Scr up.   General:  Weak appearing. No resp  difficulty HEENT: normal Neck: supple. no JVD. Carotids 2+ bilat; no bruits. No lymphadenopathy or thryomegaly appreciated. Cor: PMI nondisplaced. Regular rate & rhythm. No rubs, gallops or murmurs. Lungs: clear Abdomen: soft, nontender, nondistended. No hepatosplenomegaly. No bruits or masses. Good bowel sounds. Extremities: no cyanosis, clubbing, rash, tr edema Neuro: alert & orientedx3, cranial nerves grossly intact. moves all 4 extremities w/o difficulty. Affect pleasant  Co-ox improved on higher dose milrinone. Looks like he will be inotrope dependent at least for now. Will need to decide between home milrinone to give heart more time to heal from AF cardiomyopathy or Hospice Care. He has become quite weak and if he decides to continue aggressive care will need to find SNF that will take milrinone.   Volume status looks up. Check CVP. Increase diuretics as needed. Can switch amio to po. He has had an extensive load. Watch renal function. May need to stop Ranexa  Glori Bickers, MD  11:12 AM

## 2020-07-03 NOTE — Progress Notes (Signed)
Patient ID: Wesley Winters, male   DOB: 1940-02-16, 80 y.o.   MRN: [id number]    Progress Note from the Palliative Medicine Team at [facility]   Patient Name: Wesley Winters        Date: 07/03/2020 DOB: 08/24/1940  Age: 80 y.o. MRN#: [id number] Attending Physician: Hosie Poisson, MD Primary Care Physician: [name], MD Admit Date: 06/14/2020   Medical records reviewed   Patient is 80 year old male with history of severe systolic congestive heart failure with ejection fraction 25% suspected to be from tachycardia induced, A. fib, coronary artery disease status post stenting, diabetes type 2 who initially presented to the emergency room with complaints of poor appetite, weakness, fall.     Lab work showed worsening kidney function with creatinine in the range of 3, elevated liver enzymes.  Patient was admitted for the management of cardiogenic shock.     Assessment and Plan    Today is day 19 of this hospital stay. -  Ongoing ---Acute on chronic systolic congestive heart failure, A. fib with RVR, coronary artery disease, acute kidney injury, worsening  weakness and fatigue.   Failed milrinone wean yesterday, Co. ox dropped.    Patient and family now face need for a more definitive decision regarding continue  milrinone on discharge.   This NP visited patient at the bedside as a follow up for palliative medicine needs and emotional support. Patient is alert and oriented, his  wife and daughter are at the bedside.  Continued education regarding the seriousness of his current medical situation.  All understand milrinone has an intervention to enhance quality of life, not necessarily quantity.  Education offered on concepts regarding human mortality and adult failure to thrive.  Education offered on the limitations of medical interventions to prolong quality of life when the body fails to thrive.  Education offered on the difference between an aggressive medical intervention path and a  palliative comfort path for this patient at this time in this situation.  Education offered on various transition of care options.  Skilled nursing facility for short-term rehab, skilled nursing facility for long-term care, home with home health and home with hospice services discussed.  After discussion had with this nurse practitioner and transition of care, patient and family have voiced their hope to continue with IV milrinone and move forward with continued rehabilitation at [facility] skilled nursing facility.  I shared with patient and family my worry his high risk for decompensation.  Education offered on the difference between an aggressive medical intervention path and a palliative comfort path for this patient at this time in this situation.  Hospice benefit detailed both in the home and at a residential facility.  Education offered on the importance of  continued conversation with family and their  medical providers regarding overall plan of care and treatment options,  ensuring decisions are within the context of the patients values and GOCs.  GOC documented In [place].  Unable to complete MOST form today, too overwhelming for more decisions.  Hopefully will be able to complete that document before discharge to skilled nursing facility next week.   Questions and concerns addressed   PMT will continue to support holistically   Total time spent on the unit was 45 minutes  This nurse practitioner informed  the patient/family and the attending/ via secure chat  that I will be out of the hospital until Monday July 11 th , a provider with the palliative medicine team will continue to support  holistically.   Call palliative medicine team phone # [phone] with questions or concerns.    Greater than 50% of the time was spent in counseling and coordination of care  [name] NP  Palliative Medicine Team Team Phone # [phone] Pager [phone]

## 2020-07-04 LAB — GLUCOSE, CAPILLARY
Glucose-Capillary: 96 mg/dL (ref 70–99)
Glucose-Capillary: 176 mg/dL — ABNORMAL HIGH (ref 70–99)
Glucose-Capillary: 173 mg/dL — ABNORMAL HIGH (ref 70–99)
Glucose-Capillary: 103 mg/dL — ABNORMAL HIGH (ref 70–99)

## 2020-07-04 LAB — BASIC METABOLIC PANEL
Anion gap: 8 (ref 5–15)
BUN: 18 mg/dL (ref 8–23)
CO2: 31 mmol/L (ref 22–32)
Calcium: 8 mg/dL — ABNORMAL LOW (ref 8.9–10.3)
Chloride: 90 mmol/L — ABNORMAL LOW (ref 98–111)
Creatinine, Ser: 1.44 mg/dL — ABNORMAL HIGH (ref 0.61–1.24)
GFR, Estimated: 49 mL/min — ABNORMAL LOW (ref >60)
Glucose, Bld: 89 mg/dL (ref 70–99)
Potassium: 3.6 mmol/L (ref 3.5–5.1)
Sodium: 129 mmol/L — ABNORMAL LOW (ref 135–145)

## 2020-07-04 LAB — CBC
HCT: 27.6 % — ABNORMAL LOW (ref 39.0–52.0)
Hemoglobin: 8.2 g/dL — ABNORMAL LOW (ref 13.0–17.0)
MCH: 23 pg — ABNORMAL LOW (ref 26.0–34.0)
MCHC: 29.7 g/dL — ABNORMAL LOW (ref 30.0–36.0)
MCV: 77.5 fL — ABNORMAL LOW (ref 80.0–100.0)
Platelets: 257 10*3/uL (ref 150–400)
RBC: 3.56 MIL/uL — ABNORMAL LOW (ref 4.22–5.81)
RDW: 19.9 % — ABNORMAL HIGH (ref 11.5–15.5)
WBC: 8 10*3/uL (ref 4.0–10.5)
nRBC: 0 % (ref 0.0–0.2)

## 2020-07-04 LAB — MAGNESIUM: Magnesium: 1.9 mg/dL (ref 1.7–2.4)

## 2020-07-04 LAB — COOXEMETRY PANEL
Carboxyhemoglobin: 1.6 % — ABNORMAL HIGH (ref 0.5–1.5)
Methemoglobin: 1 % (ref 0.0–1.5)
O2 Saturation: 58.4 %
Total hemoglobin: 8.6 g/dL — ABNORMAL LOW (ref 12.0–16.0)

## 2020-07-04 MED ORDER — MAGNESIUM SULFATE 2 GM/50ML IV SOLN
2.0000 g | Freq: Once | INTRAVENOUS | Status: AC
  Administered 2020-07-04: 2 g via INTRAVENOUS
  Filled 2020-07-04: qty 50

## 2020-07-04 MED ORDER — PANTOPRAZOLE SODIUM 40 MG PO TBEC
40.0000 mg | DELAYED_RELEASE_TABLET | Freq: Every day | ORAL | Status: DC
  Administered 2020-07-05 – 2020-07-09 (×5): 40 mg via ORAL
  Filled 2020-07-04 (×5): qty 1

## 2020-07-04 MED ORDER — MORPHINE SULFATE (PF) 2 MG/ML IV SOLN: 2.0000 mg | INTRAVENOUS | Status: DC | PRN

## 2020-07-04 MED ORDER — POTASSIUM CHLORIDE CRYS ER 20 MEQ PO TBCR
40.0000 meq | EXTENDED_RELEASE_TABLET | Freq: Three times a day (TID) | ORAL | Status: AC
  Administered 2020-07-04 (×2): 40 meq via ORAL
  Filled 2020-07-04 (×2): qty 2

## 2020-07-04 NOTE — TOC Progression Note (Signed)
Transition of Care (TOC) - Progression Note  Heart Failure   Patient Details  Name: Wesley Winters MRN: [id number] Date of Birth: 1940-01-27  Transition of Care [facility] CM/SW [place], [place] Phone Number: 07/04/2020, 3:33 PM  Clinical Narrative:    CSW spoke with the patients spouse and daughter at bedside while the patient was working with occupational therapy. CSW spoke with the patients spouse, Inez Catalina regarding [employer]BS medical bills/statements and next steps for filing an appeal. CSW and family discussed next steps for disposition however the patients daughter, Alyse Low had many questions for the MD and would like to gain further insight from the MD that the social worker could not speak to. CSW informed the cardiologist to follow up with the patient and his family when he gets a chance today. The patients daughter would like to know the reality of getting physical therapy and if that would be beneficial or if her father is declining medically and will continue to further decline. The CSW encouraged the family to follow up with the patients doctors for further medical information and clarification.   CSW will continue to follow throughout discharge.   Expected Discharge Plan: [place] Barriers to Discharge: Continued Medical Work up  Expected Discharge Plan and Services Expected Discharge Plan: [place] In-house Referral: Clinical Social Work Discharge Planning Services: CM Consult   Living arrangements for the past 2 months: Single Family Home                                       Social Determinants of Health (SDOH) Interventions    Readmission Risk Interventions No flowsheet data found.  Tandy Lewin, MSW, [place] Heart Failure Social Worker

## 2020-07-04 NOTE — Plan of Care (Signed)
  Problem: Clinical Measurements: Goal: Respiratory complications will improve Outcome: Progressing   Problem: Nutrition: Goal: Adequate nutrition will be maintained Outcome: Progressing   

## 2020-07-04 NOTE — Progress Notes (Signed)
Physical Therapy Treatment Patient Details Name: ROMIE TAY MRN: [id number] DOB: 06-15-1940 Today's Date: 07/04/2020    History of Present Illness Pt is 80 yo admitted 6/11 after 2 falls at home with fatigue and weakness.  Pt found to have afib with RVR, hypotensive, worsening kidney function and admitted for cardiogenic shock. Pt requiring Milrinone drip. PMHx; HF, cardiomyopathy with EF 25%, Afib, CAD, DM, HTN, recent admit 5/23-5/31    PT Comments    Pt requiring encouragement but making good progress today.  He does require min A at times with cues for safety and chair follow for frequent rest breaks.  Pt will frequently state things like "I'm not sure I can," but will try and can do more than he expects.  Continue to advance.     Follow Up Recommendations  SNF;Supervision for mobility/OOB     Equipment Recommendations  Rolling walker with 5" wheels;3in1 (PT)    Recommendations for Other Services       Precautions / Restrictions Precautions Precautions: Fall Restrictions Weight Bearing Restrictions: No    Mobility  Bed Mobility Overal bed mobility: Needs Assistance Bed Mobility: Supine to Sit     Supine to sit: Min assist;HOB elevated     General bed mobility comments: increased time    Transfers Overall transfer level: Needs assistance Equipment used: Rolling walker (2 wheeled) Transfers: Sit to/from Stand Sit to Stand: Min guard;Min assist         General transfer comment: Min guard initially but with fatigue requiring min A.  Cues for hand placement and leaning forward.  Sit to stand x 6 during session  Ambulation/Gait Ambulation/Gait assistance: Min assist Gait Distance (Feet): 85 Feet (45'x2, 85', 45'x2) Assistive device: Rolling walker (2 wheeled) Gait Pattern/deviations: Step-through pattern;Decreased stride length;Trunk flexed Gait velocity: decr   General Gait Details: Pt ambulated 45'x2 then 76' then 45'x2.  All with seated rest breaks,  daughter provided chair follow.  Pt at times with increased forward momentum requiring cues for RW proximity and taking his time.   Stairs             Wheelchair Mobility    Modified Rankin (Stroke Patients Only)       Balance Overall balance assessment: Needs assistance Sitting-balance support: Feet supported;No upper extremity supported Sitting balance-Leahy Scale: Fair     Standing balance support: Bilateral upper extremity supported Standing balance-Leahy Scale: Poor Standing balance comment: RW and min A at times for support; at times forward momentum                            Cognition Arousal/Alertness: Awake/alert Behavior During Therapy: WFL for tasks assessed/performed Overall Cognitive Status: Impaired/Different from baseline Area of Impairment: Problem solving;Memory                     Memory: Decreased short-term memory   Safety/Judgement: Decreased awareness of safety   Problem Solving: Slow processing;Requires verbal cues General Comments: pt with decreased problem solving, requires verbal cueing ; requires encouragement -frequently states "How far do I have to walk?, I'm not sure I can."      Exercises      General Comments General comments (skin integrity, edema, etc.): VSS during session on RA      Pertinent Vitals/Pain Pain Assessment: No/denies pain    Home Living  Prior Function            PT Goals (current goals can now be found in the care plan section) Acute Rehab PT Goals Patient Stated Goal: I need to get a little stronger PT Goal Formulation: With patient/family Time For Goal Achievement: 07/15/20 Potential to Achieve Goals: Good Progress towards PT goals: Progressing toward goals    Frequency    Min 3X/week      PT Plan Current plan remains appropriate    Co-evaluation              AM-PAC PT "6 Clicks" Mobility   Outcome Measure  Help needed turning  from your back to your side while in a flat bed without using bedrails?: A Little Help needed moving from lying on your back to sitting on the side of a flat bed without using bedrails?: A Little Help needed moving to and from a bed to a chair (including a wheelchair)?: A Little Help needed standing up from a chair using your arms (e.g., wheelchair or bedside chair)?: A Little Help needed to walk in hospital room?: A Little Help needed climbing 3-5 steps with a railing? : A Lot 6 Click Score: 17    End of Session Equipment Utilized During Treatment: Gait belt Activity Tolerance: Patient tolerated treatment well Patient left: in chair;with call bell/phone within reach;with family/visitor present Nurse Communication: Mobility status PT Visit Diagnosis: Other abnormalities of gait and mobility (R26.89);Difficulty in walking, not elsewhere classified (R26.2)     Time: 1230-1300 PT Time Calculation (min) (ACUTE ONLY): 30 min  Charges:  $Gait Training: 23-37 mins                     Abran Richard, PT Acute Rehab Services Pager [phone] [facility] [place] 07/04/2020, 1:14 PM

## 2020-07-04 NOTE — Progress Notes (Signed)
Rn unable to preform CVP due to tubing shortage in hospital

## 2020-07-04 NOTE — Progress Notes (Signed)
Occupational Therapy Treatment Patient Details Name: Wesley Winters MRN: [id number] DOB: 10-14-1940 Today's Date: 07/04/2020    History of present illness Pt is 80 yo admitted 6/11 after 2 falls at home with fatigue and weakness.  Pt found to have afib with RVR, hypotensive, worsening kidney function and admitted for cardiogenic shock. Pt requiring Milrinone drip. PMHx; HF, cardiomyopathy with EF 25%, Afib, CAD, DM, HTN, recent admit 5/23-5/31   OT comments  Pt with slower progress towards OT goals, anticipated goal date extended with ADLs as appropriate. Pt overall Min A to min guard initially for mobility to bathroom using RW. However, due to fatigue, pt with difficulty standing from regular toilet (simulating home environment), requiring Mod A x 2 to effectively stand and take steps out of bathroom using RW. Educated pt/family on energy conservation strategies (handout provided), as well as use of BSC over toilet to allow pt to push from armrests for easier toilet transfers. Continue to recommend SNF for short term rehab.    Follow Up Recommendations  SNF;Supervision/Assistance - 24 hour    Equipment Recommendations  3 in 1 bedside commode    Recommendations for Other Services      Precautions / Restrictions Precautions Precautions: Fall Precaution Comments: stool incontinence Restrictions Weight Bearing Restrictions: No       Mobility Bed Mobility Overal bed mobility: Needs Assistance Bed Mobility: Supine to Sit     Supine to sit: Min assist;HOB elevated     General bed mobility comments: up in chair    Transfers Overall transfer level: Needs assistance Equipment used: Rolling walker (2 wheeled) Transfers: Sit to/from Stand Sit to Stand: Mod assist;+2 physical assistance         General transfer comment: Initially Min A for sit to stand from recliner with RW, cues for body mechanics and sequencing. Mod A x 2 for successful sit to stand from regular toilet due to  fatigue    Balance Overall balance assessment: Needs assistance Sitting-balance support: Feet supported;No upper extremity supported Sitting balance-Leahy Scale: Fair     Standing balance support: Bilateral upper extremity supported Standing balance-Leahy Scale: Poor Standing balance comment: RW and min A at times for support; posterior lean                           ADL either performed or assessed with clinical judgement   ADL Overall ADL's : Needs assistance/impaired                         Toilet Transfer: Moderate assistance;Ambulation;Comfort height toilet;Grab bars;RW [employer] forces technical officer Details (indicate cue type and reason): simulated home environment with pt able to ambulate to bathroom at minguard initially but when attempting to stand from toilet, pt unable - required Mod A x 2 and to take steps out of bathroom to recliner that OT brought to door. Pt fatigued and required assist to manuever RW. Placed BSC over toilet and educated pt/family on using this strategy at home as pt does better when armrests present to push up from [place] and Hygiene: Maximal assistance;Sit to/from stand Toileting - Clothing Manipulation Details (indicate cue type and reason): RN present, assisted with peri care in standing       General ADL Comments: Pt continues to be limited by fatigue with short distances during ADLs. Provided energy conservation handout and education ot pt/family on strategies to trial. Reports new incontinence - educated on implementation  of toileting schedule     Vision   Vision Assessment?: No apparent visual deficits   Perception     Praxis      Cognition Arousal/Alertness: Awake/alert Behavior During Therapy: WFL for tasks assessed/performed Overall Cognitive Status: Impaired/Different from baseline Area of Impairment: Problem solving;Memory                     Memory: Decreased short-term memory    Safety/Judgement: Decreased awareness of safety   Problem Solving: Slow processing;Requires verbal cues General Comments: pt with decreased problem solving, requires verbal cueing ; requires encouragement as self limiting at times        Exercises     Shoulder Instructions       General Comments VSS on RA. Noted wound on bottom of L foot bleeding, RN present and redressed. compression stockings wet so removed and RN educated pt to remain with LEs elevated until new stockings placed    Pertinent Vitals/ Pain       Pain Assessment: No/denies pain  Home Living                                          Prior Functioning/Environment              Frequency  Min 2X/week        Progress Toward Goals  OT Goals(current goals can now be found in the care plan section)  Progress towards OT goals: Progressing toward goals  Acute Rehab OT Goals Patient Stated Goal: I need to get a little stronger OT Goal Formulation: With patient Time For Goal Achievement: 07/18/20 Potential to Achieve Goals: Good ADL Goals Pt Will Perform Grooming: with modified independence;standing Pt Will Perform Lower Body Bathing: sit to/from stand;sitting/lateral leans;with min guard assist Pt Will Perform Lower Body Dressing: with min guard assist;sit to/from stand;sitting/lateral leans Pt Will Transfer to Toilet: with supervision;ambulating Additional ADL Goal #1: Pt to verbalize at least 2 energy conservation strategies to implement during ADLs/mobility  Plan Discharge plan remains appropriate;Frequency remains appropriate    Co-evaluation                 AM-PAC OT "6 Clicks" Daily Activity     Outcome Measure   Help from another person eating meals?: None Help from another person taking care of personal grooming?: A Little Help from another person toileting, which includes using toliet, bedpan, or urinal?: A Lot Help from another person bathing (including washing,  rinsing, drying)?: A Lot Help from another person to put on and taking off regular upper body clothing?: A Little Help from another person to put on and taking off regular lower body clothing?: Total 6 Click Score: 15    End of Session Equipment Utilized During Treatment: Gait belt;Rolling walker  OT Visit Diagnosis: Unsteadiness on feet (R26.81);Other abnormalities of gait and mobility (R26.89);Muscle weakness (generalized) (M62.81)   Activity Tolerance Patient limited by fatigue   Patient Left in chair;with call bell/phone within reach;with family/visitor present   Nurse Communication Mobility status        Time: 6283-6629 OT Time Calculation (min): 38 min  Charges: OT General Charges $OT Visit: 1 Visit OT Treatments $Self Care/Home Management : 8-22 mins $Therapeutic Activity: 8-22 mins  Malachy Chamber, OTR/L Acute Rehab Services Office: [phone]    Layla Maw 07/04/2020, 2:00 PM

## 2020-07-04 NOTE — Progress Notes (Signed)
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 fib, coronary artery disease status post stenting, diabetes type 2 who initially presented to the emergency room with complaints of poor appetite, weakness, fall.  On presentation he was hypotensive, found to have intermittent episodes of A. fib with RVR.  Lab work showed worsening kidney function with creatinine in the range of 3, elevated liver enzymes.  Patient was admitted for the management of cardiogenic shock.  Cardiology was primary team 06/24/2020, transferred to [facility] service on 06/25/2020.  On milrinone drip. Patient had to be transferred back to ICU and levophed was added to heart failure regimen. Pt seen and examined at bedside.no new complaints today. Pt ambulated in the hallway, denies any sob or chest pain.     Assessment & Plan:   Principal Problem:   Cardiogenic shock ([place]) Active Problems:   Diabetes ([place])   CAD in native artery   Persistent atrial fibrillation (H[facility])   Chronic anticoagulation   ARF (acute renal failure) (HCC)   PAF (paroxysmal atrial fibrillation) (H[facility])   Pressure injury of skin   Acute on chronic systolic CHF: ECHO showed LVEF of 20 to 25%, weaned off levophed , currently on milrinone, along with torsemide 20 mg daily and aldactone 12.5 mg daily.  Diuresed about 7.5 lit since admission.  Continue with strict intake and output.  Daily weights.  Creatinine around 1.4 today.  Cardiology/ HF team on board.    Paroxysmal atrial fib with RVR S/P tee/ DCCV on 6/17, currently in sinus rhythm.  On IV amiodarone and digoxin for rate control.  On eliquis for anti coagulation.     AKI:  SEC to cardiogenic shock/ ATN.   Creatinine at 1.44.   Elevated liver enzymes:  Congestive hepatopathy. Repeat liver enzymes in am.  Continue to monitor.    Type 2 DM:  Insulin dependent, continue with SSI.  CBG (last 3)  Recent Labs    07/03/20 2111 07/04/20 0634 07/04/20 1109  GLUCAP 121* 96 176*    Resume lantus and SSI.    Anemia of chronic disease:  Transfuse to keep hemoglobin greater than 7. Hemoglobin of 8.2.    Acute urinary retention:  Resolved.    Left foot wound/ Pressure injury.  Pressure Injury 06/25/20 Buttocks Left Stage 2 -  Partial thickness loss of dermis presenting as a shallow open injury with a red, pink wound bed without slough. 2.7 cm X 2.0 cm open stage 2, surrounding area is blanchable (Active)  06/25/20 0810  Location: Buttocks  Location Orientation: Left  Staging: Stage 2 -  Partial thickness loss of dermis presenting as a shallow open injury with a red, pink wound bed without slough.  Wound Description (Comments): 2.7 cm X 2.0 cm open stage 2, surrounding area is blanchable  Present on Admission: No     Pressure Injury 06/25/20 Buttocks Right Stage 2 -  Partial thickness loss of dermis presenting as a shallow open injury with a red, pink wound bed without slough. 2.3 cm X 1.0 cm stage 2, surrounding area is blanchable (Active)  06/25/20 0811  Location: Buttocks  Location Orientation: Right  Staging: Stage 2 -  Partial thickness loss of dermis presenting  as a shallow open injury with a red, pink wound bed without slough.  Wound Description (Comments): 2.3 cm X 1.0 cm stage 2, surrounding area is blanchable  Present on Admission: No  Wound care consulted and recommendations given.    Hyponatremia:  Sec to fluid overload. Sodium improved to 129.    Sepsis sec to proteus mirablilis bacteremia sec to UTI.  Completed 7 days of IV rocephin.  Sepsis physiology resolved.     Body mass index is 31.21 kg/m. Obesity.    GERD:  Will start him protonix.    Deconditioning Therapy evaluations show SNF.   DVT prophylaxis: eliquis.  Code Status: (Full code) Family Communication: family at bedside.  Disposition:   Status is: Inpatient  Remains inpatient appropriate because:IV treatments appropriate due to intensity of illness or inability to take PO  Dispo: The patient is from: Home              Anticipated d/c is to: Home              Patient currently is not medically stable to d/c.   Difficult to place patient No       Consultants:  Heart failure team.   Procedures: None.   Antimicrobials: none.    Subjective: No new complaints.   Objective: Vitals:   07/04/20 0307 07/04/20 0649 07/04/20 0720 07/04/20 1107  BP: 109/65  91/67 122/63  Pulse: 72  81 74  Resp: 16  20 18   Temp: 98 F (36.7 C)   97.9 F (36.6 C)  TempSrc: Oral   Oral  SpO2: 98%  100% 100%  Weight:  93.1 kg    Height:       No intake or output data in the 24 hours ending 07/04/20 1217  Filed Weights   07/02/20 0627 07/03/20 0618 07/04/20 0649  Weight: 101.2 kg 99.6 kg 93.1 kg    Examination:  General exam: Elderly gentleman on room air not in any kind of distress Respiratory system: Diminished entry at bases, no wheezing or rhonchi Cardiovascular system: S1-S2 heard, regular rhythm, pedal edema present Gastrointestinal system: Abdomen is soft nontender nondistended bowel sounds normal Central nervous system: Alert and oriented. Extremities: Pedal edema present Skin: Pressure injury in the back Psychiatry: Mood is appropriate    Data Reviewed: I have personally reviewed following labs and imaging studies  CBC: Recent Labs  Lab 06/28/20 0432 07/02/20 0417 07/04/20 0500  WBC 10.4 8.7 8.0  HGB 8.6* 8.2* 8.2*  HCT 28.8* 26.9* 27.6*  MCV 78.0* 77.1* 77.5*  PLT 258 233 257     Basic Metabolic Panel: Recent Labs  Lab 06/30/20 0431 06/30/20 0908 07/01/20 0409 07/02/20 0417 07/03/20 0459 07/04/20 0500  NA  --  130* 127* 128*  128* 129*  K  --  3.5 3.7 3.7 3.8 3.6  CL  --  90* 89* 92* 90* 90*  CO2  --  31 30 30 29 31   GLUCOSE  --  146* 165* 109* 111* 89  BUN  --  13 15 15 20 18   CREATININE  --  1.39* 1.34* 1.28* 1.56* 1.44*  CALCIUM  --  7.8* 7.8* 7.5* 7.6* 8.0*  MG 2.1  --  1.5* 3.3* 1.7 1.9  PHOS  --   --  3.3  --   --   --      GFR: Estimated Creatinine Clearance: 43.8 mL/min (A) (by C-G formula based on SCr of 1.44 mg/dL (H)).  Liver Function Tests: Recent  Labs  Lab 07/01/20 0409  ALBUMIN 1.8*     CBG: Recent Labs  Lab 07/03/20 1134 07/03/20 1617 07/03/20 2111 07/04/20 0634 07/04/20 1109  GLUCAP 172* 93 121* 96 176*      No results found for this or any previous visit (from the past 240 hour(s)).       Radiology Studies: No results found.      Scheduled Meds:  amiodarone  200 mg Oral BID   apixaban  5 mg Oral BID   atorvastatin  40 mg Oral Daily   Chlorhexidine Gluconate Cloth  6 each Topical Q0600   digoxin  0.0625 mg Oral Daily   docusate sodium  100 mg Oral Daily   insulin aspart  0-15 Units Subcutaneous TID WC   insulin aspart  0-5 Units Subcutaneous QHS   insulin glargine  10 Units Subcutaneous QHS   midodrine  5 mg Oral TID WC   mupirocin ointment   Topical Q12H   potassium chloride  40 mEq Oral Q8H   ranolazine  500 mg Oral BID   senna  1 tablet Oral Daily   sodium chloride flush  10-40 mL Intracatheter Q12H   spironolactone  12.5 mg Oral Daily   tamsulosin  0.4 mg Oral QPC supper   torsemide  20 mg Oral Daily   vitamin B-12  1,000 mcg Oral QHS   Continuous Infusions:  sodium chloride Stopped (06/23/20 0817)   sodium chloride Stopped (06/30/20 0429)   ferumoxytol 510 mg (07/02/20 1429)   milrinone 0.375 mcg/kg/min (07/04/20 0236)     LOS: 20 days        Hosie Poisson, MD [facility]   To contact the attending provider between 7A-7P or the covering provider during after hours 7P-7A, please log into the web site [email or web address] and access  using universal [place] password for that web site. If you do not have the password, please call the hospital operator.  07/04/2020, 12:17 PM

## 2020-07-04 NOTE — Progress Notes (Addendum)
Advanced Heart Failure Rounding Note  PCP-Cardiologist: Sinclair Grooms, MD   Subjective:   Cardiogenic shock. New onset AF.   6/12 CO-OX 43% --->Started on milrinone 0.25 mcg.  6/13 Milrinone increased to 0.375. Co-ox 58% today.  6/15 Back in Afib w/ RVR, amio rebolused, rate increased to 60  6/15 Fever spike + leukocytosis + hypotension>>started on empiric cefepime + NE. BCx with proteus 6/17 TEE/DC-CV briefly in SR 6/19 Back in AF. Milrinone cut back to 0.25 mcg.  6/20 Developed petechiae R fingers.  6/21  Milrinone increased to 0.375 and NE 3 added. 6/24 Attempted to wean milrinone to 0.25 and NE to 2 -> co-ox dropped 48%-> 33% 6/26 NE stopped, milrinone reduced to 0.25  6/29 Failed milrinone wean. CO-OX dropped. Milrinone increased to 0.375 mcg.  6/30 Amio drip switched to po amio.   CO-OX 58% on milrinone 0.375 mcg.    Creatinine trending down  1.6>1.4   Na 129   Denies SOB. Denies chest pain.   Objective:   Weight Range: 93.1 kg Body mass index is 31.21 kg/m.   Vital Signs:   Temp:  [97.6 F (36.4 C)-98.5 F (36.9 C)] 97.9 F (36.6 C) (07/01 1107) Pulse Rate:  [65-81] 74 (07/01 1107) Resp:  [16-20] 18 (07/01 1107) BP: (91-122)/(57-67) 122/63 (07/01 1107) SpO2:  [94 %-100 %] 100 % (07/01 1107) Weight:  [93.1 kg] 93.1 kg (07/01 0649) Last BM Date: 07/02/20  Weight change: Filed Weights   07/02/20 0627 07/03/20 0618 07/04/20 0649  Weight: 101.2 kg 99.6 kg 93.1 kg    Intake/Output:  No intake or output data in the 24 hours ending 07/04/20 1125    Physical Exam  General:  In bed.  No resp difficulty HEENT: normal Neck: supple. no JVD. Carotids 2+ bilat; no bruits. No lymphadenopathy or thryomegaly appreciated. Cor: PMI nondisplaced. Regular rate & rhythm. No rubs, gallops or murmurs. Lungs: clear Abdomen: soft, nontender, nondistended. No hepatosplenomegaly. No bruits or masses. Good bowel sounds. Extremities: no cyanosis, clubbing, rash, R and  LLE trace-1+ edema. Ted hose in place. LUE PICC  Neuro: alert & orientedx3, cranial nerves grossly intact. moves all 4 extremities w/o difficulty. Affect pleasant  Telemetry   NSR 60-70s   Labs    CBC Recent Labs    07/02/20 0417 07/04/20 0500  WBC 8.7 8.0  HGB 8.2* 8.2*  HCT 26.9* 27.6*  MCV 77.1* 77.5*  PLT 233 505   Basic Metabolic Panel Recent Labs    07/03/20 0459 07/04/20 0500  NA 128* 129*  K 3.8 3.6  CL 90* 90*  CO2 29 31  GLUCOSE 111* 89  BUN 20 18  CREATININE 1.56* 1.44*  CALCIUM 7.6* 8.0*  MG 1.7 1.9   Liver Function Tests No results for input(s): AST, ALT, ALKPHOS, BILITOT, PROT, ALBUMIN in the last 72 hours.   No results for input(s): LIPASE, AMYLASE in the last 72 hours. Cardiac Enzymes No results for input(s): CKTOTAL, CKMB, CKMBINDEX, TROPONINI in the last 72 hours.  BNP: BNP (last 3 results) Recent Labs    05/26/20 1429 06/15/20 0729  BNP 1,609.8* 2,424.4*    ProBNP (last 3 results) Recent Labs    07/11/19 1107 04/23/20 1446  PROBNP 445 2,488*     D-Dimer No results for input(s): DDIMER in the last 72 hours. Hemoglobin A1C No results for input(s): HGBA1C in the last 72 hours. Fasting Lipid Panel No results for input(s): CHOL, HDL, LDLCALC, TRIG, CHOLHDL, LDLDIRECT in the last  72 hours. Thyroid Function Tests No results for input(s): TSH, T4TOTAL, T3FREE, THYROIDAB in the last 72 hours.  Invalid input(s): FREET3   Other results:   Imaging    No results found.   Medications:     Scheduled Medications:  amiodarone  200 mg Oral BID   apixaban  5 mg Oral BID   atorvastatin  40 mg Oral Daily   Chlorhexidine Gluconate Cloth  6 each Topical Q0600   digoxin  0.0625 mg Oral Daily   docusate sodium  100 mg Oral Daily   insulin aspart  0-15 Units Subcutaneous TID WC   insulin aspart  0-5 Units Subcutaneous QHS   insulin glargine  10 Units Subcutaneous QHS   midodrine  5 mg Oral TID WC   mupirocin ointment   Topical  Q12H   potassium chloride  40 mEq Oral Q8H   ranolazine  500 mg Oral BID   senna  1 tablet Oral Daily   sodium chloride flush  10-40 mL Intracatheter Q12H   spironolactone  12.5 mg Oral Daily   tamsulosin  0.4 mg Oral QPC supper   torsemide  20 mg Oral Daily   vitamin B-12  1,000 mcg Oral QHS    Infusions:  sodium chloride Stopped (06/23/20 0817)   sodium chloride Stopped (06/30/20 0429)   ferumoxytol 510 mg (07/02/20 1429)   milrinone 0.375 mcg/kg/min (07/04/20 0236)    PRN Medications:   Assessment/Plan    Acute systolic HF -> 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 Lactate 9.9  - doubt ischemic as hstrop low despite profound hemodynamic support but given h/p CAD and multiple CRFs will need ischemic eval once more stable and renal function improves - Converted to NSR. HR controlled - Failed milrinone wean. Remains on milrinone 0.375 mcg---> CO-OX 58% .  - CVP not hooked up. Waiting on new tubing.  - Volume status appears stable. Continue torsemide 20 mg daily  - Continue digoxin , dig level 0.6 on 06/29/20 - Increase spiro to 25 mg daily.  - Renal function stable.   - Off GDMT due to need for inotropes  2. CAD - s/p previous LAD stenting last cath 2005 with patent coronaries - doubt ischemia is major factor in HF given low hsTrop however with inability to wean inotropes will consider R/L cath early next week.  - No chest pain.  - continue ASA - Continue lipitor 40 mg daily.    3. AF with RVR - Eliquis stopped 6/12 and switched to heparin.  - TEE/DCCV on 6/17-> NSR then went back to AF - Remains in NSR.  - Transitioned from IV amio to amio 200 mg po twice a day.  - Back on Eliquis  - EP consulted. Not currently a candidate for ablation or CRT-P with wounds.   4.  AKI - due to ATN and  shock - peak creatine 2.9 - Creatinine trending up 1.3>1.6>1.4    5. Shock liver - resolved   6. DM2 - SSI    7. Hypokalemia  - resolved, K 3.6   8. Sepsis/GN Bacteremia  - PCT 2.7. CXR w/o infiltrate. Foley replaced . BCx Proteus Species.  - Abx narrowed from cefepime to ceftriaxone per ID. Has completed 7 days - sepsis resolved  9. Urinary retention - failed Foley removal - continue  Flomax   10. Wound on left foot - deep but clean based - WC recs bid dressing changes   11. Hyponatremia - 6/18 and 6/22 tolvaptan, mentating ok  -  Na 129  today - fluid restrict and monitor   12. Petechial rash on R hand/L hand /R foot  - ? Cholesterol emboli. - neurovascular intact. No wound - follow   13. Code status - Limited Code- no intubation/no CPR. ACLS drugs only.  - Palliative following for goals of care.   14. Deconditioning - PT/OT following, currently recommend SNF - Will need continuous IV milrinone  15. Hypomagnesia -Mag 1.9.  Give 2 grams of Mag now.  -Check Mag in am.   SW following for SNF. [facility] will not have bed until until next week.   Dr Haroldine Laws to follow and discuss care with his family.  Length of Stay: [place], NP  07/04/2020, 11:25 AM  Advanced Heart Failure Team Pager [phone] (M-F; 7a - 5p)  Please contact [facility] for night-coverage after hours (5p -7a ) and weekends on [email or web address]  Patient seen and examined with the above-signed Advanced Practice Provider and/or Housestaff. I personally reviewed laboratory data, imaging studies and relevant notes. I independently examined the patient and formulated the important aspects of the plan. I have edited the note to reflect any of my changes or salient points. I have personally discussed the plan with the patient and/or family.  Has failed milrinone wean. Co-ox now stable on milrinone 0.375. Remains in NSR.   General:  Sitting up in bed  No resp difficulty HEENT: normal Neck:  supple. no JVD. Carotids 2+ bilat; no bruits. No lymphadenopathy or thryomegaly appreciated. Cor: PMI nondisplaced. Regular rate & rhythm. No rubs, gallops or murmurs. Lungs: clear Abdomen: soft, nontender, nondistended. No hepatosplenomegaly. No bruits or masses. Good bowel sounds. Extremities: no cyanosis, clubbing, rash, tr edema Neuro: alert & orientedx3, cranial nerves grossly intact. moves all 4 extremities w/o difficulty. Affect pleasant  Long talk with patient, his wife and daughter about options. After much back and forth, he has decided to stop milrinone and see what happens (will not restart). If stabilizes will plan to send to SNF (would like [facility]). If deteriorates will plan transition to [place] (either home or facility). Code status changed to DNR.  Will stop checking co-ox and go by clinical course.   Palliative Care following.   Glori Bickers, MD  6:20 PM

## 2020-07-05 DIAGNOSIS — I5043 Acute on chronic combined systolic (congestive) and diastolic (congestive) heart failure: Secondary | ICD-10-CM

## 2020-07-05 LAB — BASIC METABOLIC PANEL
Anion gap: 8 (ref 5–15)
BUN: 19 mg/dL (ref 8–23)
CO2: 30 mmol/L (ref 22–32)
Calcium: 8.1 mg/dL — ABNORMAL LOW (ref 8.9–10.3)
Chloride: 90 mmol/L — ABNORMAL LOW (ref 98–111)
Creatinine, Ser: 1.52 mg/dL — ABNORMAL HIGH (ref 0.61–1.24)
GFR, Estimated: 45 mL/min — ABNORMAL LOW (ref >60)
Glucose, Bld: 89 mg/dL (ref 70–99)
Potassium: 4.4 mmol/L (ref 3.5–5.1)
Sodium: 128 mmol/L — ABNORMAL LOW (ref 135–145)

## 2020-07-05 LAB — GLUCOSE, CAPILLARY
Glucose-Capillary: 98 mg/dL (ref 70–99)
Glucose-Capillary: 156 mg/dL — ABNORMAL HIGH (ref 70–99)
Glucose-Capillary: 137 mg/dL — ABNORMAL HIGH (ref 70–99)
Glucose-Capillary: 172 mg/dL — ABNORMAL HIGH (ref 70–99)
Glucose-Capillary: 106 mg/dL — ABNORMAL HIGH (ref 70–99)

## 2020-07-05 LAB — HEPATIC FUNCTION PANEL
ALT: 22 U/L (ref 0–44)
AST: 20 U/L (ref 15–41)
Albumin: 1.7 g/dL — ABNORMAL LOW (ref 3.5–5.0)
Alkaline Phosphatase: 98 U/L (ref 38–126)
Bilirubin, Direct: 0.3 mg/dL — ABNORMAL HIGH (ref 0.0–0.2)
Indirect Bilirubin: 0.7 mg/dL (ref 0.3–0.9)
Total Bilirubin: 1 mg/dL (ref 0.3–1.2)
Total Protein: 5.2 g/dL — ABNORMAL LOW (ref 6.5–8.1)

## 2020-07-05 LAB — MAGNESIUM: Magnesium: 1.9 mg/dL (ref 1.7–2.4)

## 2020-07-05 MED ORDER — SPIRONOLACTONE 25 MG PO TABS
25.0000 mg | ORAL_TABLET | Freq: Every day | ORAL | Status: DC
  Administered 2020-07-06 – 2020-07-09 (×4): 25 mg via ORAL
  Filled 2020-07-05 (×4): qty 1

## 2020-07-05 MED ORDER — MIDODRINE HCL 5 MG PO TABS
2.5000 mg | ORAL_TABLET | Freq: Three times a day (TID) | ORAL | Status: DC
  Administered 2020-07-05 – 2020-07-09 (×14): 2.5 mg via ORAL
  Filled 2020-07-05 (×14): qty 1

## 2020-07-05 NOTE — Progress Notes (Signed)
Secretary tried to order CVP tubing again. System wide shortage. Unable to obtain tubing. Will try sources on other units.

## 2020-07-05 NOTE — Progress Notes (Signed)
PROGRESS NOTE    Wesley Winters  XIP:[id number] DOB: June 05, 1940 DOA: 06/14/2020 PCP: [name], MD    Chief Complaint  Patient presents with   Weakness    Brief Narrative:   Patient is 80 year old male with history of severe systolic congestive heart failure with ejection fraction 25% suspected to be from tachycardia induced, A. fib, coronary artery disease status post stenting, diabetes type 2 who initially presented to the emergency room with complaints of poor appetite, weakness, fall.  On presentation he was hypotensive, found to have intermittent episodes of A. fib with RVR.  Lab work showed worsening kidney function with creatinine in the range of 3, elevated liver enzymes.  Patient was admitted for the management of cardiogenic shock.  Cardiology was primary team 06/24/2020, transferred to [facility] service on 06/25/2020.  On milrinone drip. Patient had to be transferred back to ICU and levophed was added to heart failure regimen. PT seen and examined at bedside, off milrinone gtt and amiodarone gtt. No new complaints.     Assessment & Plan:   Principal Problem:   Cardiogenic shock ([place]) Active Problems:   Diabetes ([place])   CAD in native artery   Persistent atrial fibrillation (HCC)   Chronic anticoagulation   ARF (acute renal failure) (HCC)   PAF (paroxysmal atrial fibrillation) (HCC)   Pressure injury of skin   Acute on chronic systolic CHF: ECHO showed LVEF of 20 to 25%, weaned off levophed , currently on milrinone, along with torsemide 20 mg daily and aldactone 12.5 mg daily.  Urine output not recorder ed yesterday. Diuresed about 6.6lit since admission Continue with strict intake and output.  Daily weights.  Creatinine around 1.5 today.  Cardiology/ HF team on board.    Paroxysmal atrial fib with RVR S/P tee/ DCCV on 6/17, currently in sinus rhythm.  Weaned off amiodarone gtt last night, on digoxin.  On eliquis for anti coagulation.     AKI:  SEC to cardiogenic  shock/ ATN.  Creatinine stable around 1.5   Elevated liver enzymes:  Congestive hepatopathy. Repeat liver enzymes in am.  Continue to monitor.    Type 2 DM:  Insulin dependent, continue with SSI.  CBG (last 3)  Recent Labs    07/04/20 1554 07/04/20 2145 07/05/20 0645  GLUCAP 173* 103* 98    Resume lantus and SSI.  No changes in meds.    Anemia of chronic disease:  Transfuse to keep hemoglobin greater than 7. Hemoglobin of 8.2.    Acute urinary retention:  Resolved.    Left foot wound/ Pressure injury.  Pressure Injury 06/25/20 Buttocks Left Stage 2 -  Partial thickness loss of dermis presenting as a shallow open injury with a red, pink wound bed without slough. 2.7 cm X 2.0 cm open stage 2, surrounding area is blanchable (Active)  06/25/20 0810  Location: Buttocks  Location Orientation: Left  Staging: Stage 2 -  Partial thickness loss of dermis presenting as a shallow open injury with a red, pink wound bed without slough.  Wound Description (Comments): 2.7 cm X 2.0 cm open stage 2, surrounding area is blanchable  Present on Admission: No     Pressure Injury 06/25/20 Buttocks Right Stage 2 -  Partial thickness loss of dermis presenting as a shallow open injury with a red, pink wound bed without slough. 2.3 cm X 1.0 cm stage 2, surrounding area is blanchable (Active)  06/25/20 0811  Location: Buttocks  Location Orientation: Right  Staging: Stage 2 -  Partial  thickness loss of dermis presenting as a shallow open injury with a red, pink wound bed without slough.  Wound Description (Comments): 2.3 cm X 1.0 cm stage 2, surrounding area is blanchable  Present on Admission: No  Wound care consulted and recommendations given.    Hyponatremia:  Sec to fluid overload.    Sepsis sec to proteus mirablilis bacteremia sec to UTI.  Completed 7 days of IV rocephin.  Sepsis physiology resolved.     Body mass index is 31.14 kg/m. Obesity.    GERD:  Will start him  protonix.   Deconditioning Therapy evaluations show SNF.   DVT prophylaxis: eliquis.  Code Status: (Full code) Family Communication: family at bedside.  Disposition:   Status is: Inpatient  Remains inpatient appropriate because:IV treatments appropriate due to intensity of illness or inability to take PO  Dispo: The patient is from: Home              Anticipated d/c is to: Home              Patient currently is not medically stable to d/c.   Difficult to place patient No       Consultants:  Heart failure team.   Procedures: None.   Antimicrobials: none.    Subjective: No chest pain or sob  Objective: Vitals:   07/04/20 2328 07/05/20 0250 07/05/20 0550 07/05/20 0911  BP:  115/63    Pulse:  67  65  Resp:  20    Temp:  98 F (36.7 C)    TempSrc:  Oral    SpO2: 99% 99%    Weight:   92.9 kg   Height:        Intake/Output Summary (Last 24 hours) at 07/05/2020 1040 Last data filed at 07/04/2020 1200 Gross per 24 hour  Intake 4 ml  Output --  Net 4 ml    Filed Weights   07/03/20 0618 07/04/20 0649 07/05/20 0550  Weight: 99.6 kg 93.1 kg 92.9 kg    Examination:  General exam: elderly gentleman, not in distress.  Respiratory system: air entry fair, no wheezing heard, no tachypnea, on RA.  Cardiovascular system: S1-S2 heard, rrr, no JVD, pedal edema present.  Gastrointestinal system: Abdomen is soft NT ND BS+ Central nervous system: Alert and oriented, non focal Extremities: Pedal edema present.  Skin: Pressure injury in the back Psychiatry: Mood is appropriate    Data Reviewed: I have personally reviewed following labs and imaging studies  CBC: Recent Labs  Lab 07/02/20 0417 07/04/20 0500  WBC 8.7 8.0  HGB 8.2* 8.2*  HCT 26.9* 27.6*  MCV 77.1* 77.5*  PLT 233 257     Basic Metabolic Panel: Recent Labs  Lab 07/01/20 0409 07/02/20 0417 07/03/20 0459 07/04/20 0500 07/05/20 0530  NA 127* 128* 128* 129* 128*  K 3.7 3.7 3.8 3.6 4.4  CL 89*  92* 90* 90* 90*  CO2 30 30 29 31 30   GLUCOSE 165* 109* 111* 89 89  BUN 15 15 20 18 19   CREATININE 1.34* 1.28* 1.56* 1.44* 1.52*  CALCIUM 7.8* 7.5* 7.6* 8.0* 8.1*  MG 1.5* 3.3* 1.7 1.9 1.9  PHOS 3.3  --   --   --   --      GFR: Estimated Creatinine Clearance: 41.4 mL/min (A) (by C-G formula based on SCr of 1.52 mg/dL (H)).  Liver Function Tests: Recent Labs  Lab 07/01/20 0409  ALBUMIN 1.8*     CBG: Recent Labs  Lab 07/04/20 [phone]  07/04/20 1109 07/04/20 1554 07/04/20 2145 07/05/20 0645  GLUCAP 96 176* 173* 103* 98      No results found for this or any previous visit (from the past 240 hour(s)).       Radiology Studies: No results found.      Scheduled Meds:  amiodarone  200 mg Oral BID   apixaban  5 mg Oral BID   atorvastatin  40 mg Oral Daily   Chlorhexidine Gluconate Cloth  6 each Topical Q0600   digoxin  0.0625 mg Oral Daily   docusate sodium  100 mg Oral Daily   insulin aspart  0-15 Units Subcutaneous TID WC   insulin aspart  0-5 Units Subcutaneous QHS   insulin glargine  10 Units Subcutaneous QHS   midodrine  2.5 mg Oral TID WC   mupirocin ointment   Topical Q12H   pantoprazole  40 mg Oral Q0600   ranolazine  500 mg Oral BID   senna  1 tablet Oral Daily   sodium chloride flush  10-40 mL Intracatheter Q12H   [START ON 07/06/2020] spironolactone  25 mg Oral Daily   tamsulosin  0.4 mg Oral QPC supper   torsemide  20 mg Oral Daily   vitamin B-12  1,000 mcg Oral QHS   Continuous Infusions:  sodium chloride Stopped (06/23/20 0817)   sodium chloride Stopped (06/30/20 0429)   ferumoxytol 510 mg (07/02/20 1429)     LOS: 21 days        Hosie Poisson, MD [facility]   To contact the attending provider between 7A-7P or the covering provider during after hours 7P-7A, please log into the web site [email or web address] and access using universal [place] password for that web site. If you do not have the password, please call the hospital  operator.  07/05/2020, 10:40 AM

## 2020-07-05 NOTE — Progress Notes (Signed)
Patient ID: Wesley Winters, male   DOB: 28-Jun-1940, 80 y.o.   MRN: [id number]     Advanced Heart Failure Rounding Note  PCP-Cardiologist: Sinclair Grooms, MD   Subjective:   Cardiogenic shock. New onset AF.   6/12 CO-OX 43% --->Started on milrinone 0.25 mcg.  6/13 Milrinone increased to 0.375. Co-ox 58% today.  6/15 Back in Afib w/ RVR, amio rebolused, rate increased to 60  6/15 Fever spike + leukocytosis + hypotension>>started on empiric cefepime + NE. BCx with proteus 6/17 TEE/DC-CV briefly in SR 6/19 Back in AF. Milrinone cut back to 0.25 mcg.  6/20 Developed petechiae R fingers.  6/21  Milrinone increased to 0.375 and NE 3 added. 6/24 Attempted to wean milrinone to 0.25 and NE to 2 -> co-ox dropped 48%-> 33% 6/26 NE stopped, milrinone reduced to 0.25  6/29 Failed milrinone wean. CO-OX dropped. Milrinone increased to 0.375 mcg.  6/30 Amio drip switched to po amio.  7/1 milrinone stopped  Off milrinone, does not feel any different.  Creatinine fairly stable at 1.5.  He remains on NSR on po amiodarone.   Objective:   Weight Range: 92.9 kg Body mass index is 31.14 kg/m.   Vital Signs:   Temp:  [97.6 F (36.4 C)-98 F (36.7 C)] 98 F (36.7 C) (07/02 0250) Pulse Rate:  [65-74] 65 (07/02 0911) Resp:  [18-20] 20 (07/02 0250) BP: (113-122)/(53-66) 115/63 (07/02 0250) SpO2:  [95 %-100 %] 99 % (07/02 0250) Weight:  [92.9 kg] 92.9 kg (07/02 0550) Last BM Date: 07/04/20  Weight change: Filed Weights   07/03/20 0618 07/04/20 0649 07/05/20 0550  Weight: 99.6 kg 93.1 kg 92.9 kg    Intake/Output:   Intake/Output Summary (Last 24 hours) at 07/05/2020 1029 Last data filed at 07/04/2020 1200 Gross per 24 hour  Intake 4 ml  Output --  Net 4 ml      Physical Exam  General: NAD Neck: JVP 8 cm, no thyromegaly or thyroid nodule.  Lungs: Clear to auscultation bilaterally with normal respiratory effort. CV: Nondisplaced PMI.  Heart regular S1/S2, no S3/S4, no murmur.  Trace ankle  edema.  Abdomen: Soft, nontender, no hepatosplenomegaly, no distention.  Skin: Intact without lesions or rashes.  Neurologic: Alert and oriented x 3.  Psych: Normal affect. Extremities: No clubbing or cyanosis.  HEENT: Normal.    Telemetry   NSR 60s (personally reviewed)  Labs    CBC Recent Labs    07/04/20 0500  WBC 8.0  HGB 8.2*  HCT 27.6*  MCV 77.5*  PLT 720   Basic Metabolic Panel Recent Labs    07/04/20 0500 07/05/20 0530  NA 129* 128*  K 3.6 4.4  CL 90* 90*  CO2 31 30  GLUCOSE 89 89  BUN 18 19  CREATININE 1.44* 1.52*  CALCIUM 8.0* 8.1*  MG 1.9 1.9   Liver Function Tests No results for input(s): AST, ALT, ALKPHOS, BILITOT, PROT, ALBUMIN in the last 72 hours.   No results for input(s): LIPASE, AMYLASE in the last 72 hours. Cardiac Enzymes No results for input(s): CKTOTAL, CKMB, CKMBINDEX, TROPONINI in the last 72 hours.  BNP: BNP (last 3 results) Recent Labs    05/26/20 1429 06/15/20 0729  BNP 1,609.8* 2,424.4*    ProBNP (last 3 results) Recent Labs    07/11/19 1107 04/23/20 1446  PROBNP 445 2,488*     D-Dimer No results for input(s): DDIMER in the last 72 hours. Hemoglobin A1C No results for input(s): HGBA1C in the  last 72 hours. Fasting Lipid Panel No results for input(s): CHOL, HDL, LDLCALC, TRIG, CHOLHDL, LDLDIRECT in the last 72 hours. Thyroid Function Tests No results for input(s): TSH, T4TOTAL, T3FREE, THYROIDAB in the last 72 hours.  Invalid input(s): FREET3   Other results:   Imaging    No results found.   Medications:     Scheduled Medications:  amiodarone  200 mg Oral BID   apixaban  5 mg Oral BID   atorvastatin  40 mg Oral Daily   Chlorhexidine Gluconate Cloth  6 each Topical Q0600   digoxin  0.0625 mg Oral Daily   docusate sodium  100 mg Oral Daily   insulin aspart  0-15 Units Subcutaneous TID WC   insulin aspart  0-5 Units Subcutaneous QHS   insulin glargine  10 Units Subcutaneous QHS   midodrine  5  mg Oral TID WC   mupirocin ointment   Topical Q12H   pantoprazole  40 mg Oral Q0600   ranolazine  500 mg Oral BID   senna  1 tablet Oral Daily   sodium chloride flush  10-40 mL Intracatheter Q12H   spironolactone  12.5 mg Oral Daily   tamsulosin  0.4 mg Oral QPC supper   torsemide  20 mg Oral Daily   vitamin B-12  1,000 mcg Oral QHS    Infusions:  sodium chloride Stopped (06/23/20 0817)   sodium chloride Stopped (06/30/20 0429)   ferumoxytol 510 mg (07/02/20 1429)    PRN Medications:   Assessment/Plan    Acute systolic HF -> 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 Lactate 9.9  - doubt ischemic as hstrop low despite profound hemodynamic support but given h/p CAD and multiple CRFs will need ischemic eval once more stable and renal function improves - Converted to NSR. HR controlled - Failed milrinone wean initially, milrinone then turned off 7/1.  Plan is that if he stabilizes, will send to SNF (would like [facility]). If deteriorates will plan transition to [place] (either home or facility). Code status changed to DNR.  Will stop checking co-ox and go by clinical course. So far, seems to be tolerating discontinuation of milrinone.  - Volume status looks ok on torsemide 20 mg daily, will hook up CVP again to make sure he does not need higher torsemide dosing.  - Continue digoxin 0.0625.  - Increase spiro to 25 mg daily and can decrease midodrine to 2.5 tid.  - Renal function stable.    2. CAD - s/p previous LAD stenting last cath 2005 with patent coronaries - doubt ischemia is major factor in HF given low hsTrop, would hold off on cath.  - No chest pain.  - continue ASA - Continue lipitor 40 mg daily.    3. AF with RVR - Eliquis stopped 6/12 and switched to heparin.  - TEE/DCCV on 6/17-> NSR  then went back to AF - Remains in NSR now.  - Transitioned from IV amio to amio 200 mg po twice a day.  - Back on Eliquis  - EP consulted. Not currently a candidate for ablation/CRT-P with wounds.   4.  AKI - due to ATN and shock - peak creatine 2.9 - Creatinine trending up 1.3>1.6>1.4>1.5    5. Shock liver - resolved   6. DM2 - SSI    7. Hypokalemia  -  resolved, K 4.4  8. Sepsis/GN Bacteremia  - PCT 2.7. CXR w/o infiltrate. Foley replaced. BCx Proteus  - Abx narrowed from cefepime to ceftriaxone per ID. Has completed 7 days and off abx.  - sepsis resolved  9. Urinary retention - failed Foley removal - continue Flomax   10. Wound on left foot - deep but clean based - WC recs bid dressing changes   11. Hyponatremia - 6/18 and 6/22 tolvaptan, mentating ok  - Na 128  today - fluid restrict and monitor   12. Petechial rash on R hand/L hand /R foot  - ? Cholesterol emboli. - neurovascular intact. No wound - follow   13. Code status - Now DNR - Palliative following for goals of care, if fails milrinone discontinuation plan for hospice (so far doing ok off milrinone).    51. Deconditioning - PT/OT following, currently recommend SNF  Now appears stable off milrinone, ready for SNF.   Length of Stay: 49  Loralie Champagne, MD  07/05/2020, 10:29 AM  Advanced Heart Failure Team Pager [phone] (M-F; 7a - 5p)  Please contact [facility] for night-coverage after hours (5p -7a ) and weekends on [email or web address]

## 2020-07-05 NOTE — Progress Notes (Signed)
Patient placed himself on CPAP and is currently tolerating well. RT will continue to monitor

## 2020-07-06 LAB — BASIC METABOLIC PANEL
Anion gap: 7 (ref 5–15)
BUN: 20 mg/dL (ref 8–23)
CO2: 30 mmol/L (ref 22–32)
Calcium: 8 mg/dL — ABNORMAL LOW (ref 8.9–10.3)
Chloride: 93 mmol/L — ABNORMAL LOW (ref 98–111)
Creatinine, Ser: 1.59 mg/dL — ABNORMAL HIGH (ref 0.61–1.24)
GFR, Estimated: 43 mL/min — ABNORMAL LOW (ref >60)
Glucose, Bld: 99 mg/dL (ref 70–99)
Potassium: 4.2 mmol/L (ref 3.5–5.1)
Sodium: 130 mmol/L — ABNORMAL LOW (ref 135–145)

## 2020-07-06 LAB — GLUCOSE, CAPILLARY
Glucose-Capillary: 92 mg/dL (ref 70–99)
Glucose-Capillary: 170 mg/dL — ABNORMAL HIGH (ref 70–99)
Glucose-Capillary: 159 mg/dL — ABNORMAL HIGH (ref 70–99)
Glucose-Capillary: 91 mg/dL (ref 70–99)

## 2020-07-06 LAB — DIGOXIN LEVEL: Digoxin Level: 0.2 ng/mL — ABNORMAL LOW (ref 0.8–2.0)

## 2020-07-06 LAB — MAGNESIUM: Magnesium: 1.8 mg/dL (ref 1.7–2.4)

## 2020-07-06 MED ORDER — APIXABAN 2.5 MG PO TABS
2.5000 mg | ORAL_TABLET | Freq: Two times a day (BID) | ORAL | Status: DC
  Administered 2020-07-06 – 2020-07-09 (×7): 2.5 mg via ORAL
  Filled 2020-07-06 (×7): qty 1

## 2020-07-06 MED ORDER — HYDROXYZINE HCL 25 MG PO TABS
25.0000 mg | ORAL_TABLET | Freq: Three times a day (TID) | ORAL | Status: DC | PRN
  Administered 2020-07-06 – 2020-07-09 (×4): 25 mg via ORAL
  Filled 2020-07-06 (×4): qty 1

## 2020-07-06 MED ORDER — MAGNESIUM SULFATE IN D5W 1-5 GM/100ML-% IV SOLN
1.0000 g | Freq: Once | INTRAVENOUS | Status: AC
  Administered 2020-07-06: 1 g via INTRAVENOUS
  Filled 2020-07-06: qty 100

## 2020-07-06 MED ORDER — ALTEPLASE 2 MG IJ SOLR
2.0000 mg | Freq: Once | INTRAMUSCULAR | Status: DC
Start: 2020-07-06 — End: 2020-07-10
  Filled 2020-07-06: qty 2

## 2020-07-06 NOTE — Progress Notes (Signed)
Hospital is out of Safety set IV tubing with transducer to allow staff to perform CVPs.  Call was made to materials management to see if any had arrived and it was confirmed that none had arrived as of this am

## 2020-07-06 NOTE — Plan of Care (Signed)

## 2020-07-06 NOTE — Progress Notes (Signed)
PROGRESS NOTE    Wesley Winters  QQV:[id number] DOB: Jun 06, 1940 DOA: 06/14/2020 PCP: [name], MD    Chief Complaint  Patient presents with   Weakness    Brief Narrative:   Patient is 80 year old male with history of severe systolic congestive heart failure with ejection fraction 25% suspected to be from tachycardia induced, A. fib, coronary artery disease status post stenting, diabetes type 2 who initially presented to the emergency room with complaints of poor appetite, weakness, fall.  On presentation he was hypotensive, found to have intermittent episodes of A. fib with RVR.  Lab work showed worsening kidney function with creatinine in the range of 3, elevated liver enzymes.  Patient was admitted for the management of cardiogenic shock.  Cardiology was primary team 06/24/2020, transferred to [facility] service on 06/25/2020.  On milrinone drip. Patient had to be transferred back to ICU and levophed was added to heart failure regimen. PT seen and examined at bedside, off milrinone gtt and amiodarone gtt. No new complaints.  He reports having a good night sleep.    Assessment & Plan:   Principal Problem:   Cardiogenic shock ([place]) Active Problems:   Diabetes ([place])   CAD in native artery   Persistent atrial fibrillation (HCC)   Chronic anticoagulation   ARF (acute renal failure) (HCC)   PAF (paroxysmal atrial fibrillation) (HCC)   Pressure injury of skin   Acute on chronic systolic CHF: ECHO showed LVEF of 20 to 25%, weaned off levophed , currently on milrinone, along with torsemide 20 mg daily and aldactone 12.5 mg daily.  Continue with strict intake and output.  Daily weights.  Creatinine stable around 1.5 Cardiology/ HF team on board.    Paroxysmal atrial fib with RVR S/P tee/ DCCV on 6/17, currently in sinus rhythm.  Weaned off amiodarone gtt last night, on digoxin.  On eliquis for anti coagulation.     AKI:  SEC to cardiogenic shock/ ATN.  Creatinine stable around  1.5   Elevated liver enzymes:  Congestive hepatopathy. Repeat liver enzymes in am.  Continue to monitor.    Type 2 DM:  Insulin dependent, continue with SSI.  CBG (last 3)  Recent Labs    07/06/20 0615 07/06/20 1111 07/06/20 1631  GLUCAP 92 170* 159*    Resume lantus and SSI.  No changes in meds.    Anemia of chronic disease:  Transfuse to keep hemoglobin greater than 7. Hemoglobin of 8.2.    Acute urinary retention:  Resolved.    Left foot wound/ Pressure injury.  Pressure Injury 06/25/20 Buttocks Left Stage 2 -  Partial thickness loss of dermis presenting as a shallow open injury with a red, pink wound bed without slough. 2.7 cm X 2.0 cm open stage 2, surrounding area is blanchable (Active)  06/25/20 0810  Location: Buttocks  Location Orientation: Left  Staging: Stage 2 -  Partial thickness loss of dermis presenting as a shallow open injury with a red, pink wound bed without slough.  Wound Description (Comments): 2.7 cm X 2.0 cm open stage 2, surrounding area is blanchable  Present on Admission: No     Pressure Injury 06/25/20 Buttocks Right Stage 2 -  Partial thickness loss of dermis presenting as a shallow open injury with a red, pink wound bed without slough. 2.3 cm X 1.0 cm stage 2, surrounding area is blanchable (Active)  06/25/20 0811  Location: Buttocks  Location Orientation: Right  Staging: Stage 2 -  Partial thickness loss of dermis presenting  as a shallow open injury with a red, pink wound bed without slough.  Wound Description (Comments): 2.3 cm X 1.0 cm stage 2, surrounding area is blanchable  Present on Admission: No  Wound care consulted and recommendations given.    Hyponatremia:  Resolved. 130 today.    Sepsis sec to proteus mirablilis bacteremia sec to UTI.  Completed 7 days of IV rocephin.  Sepsis physiology resolved.     Body mass index is 33.49 kg/m. Obesity.    GERD:  Will start him protonix.   Deconditioning Therapy  evaluations show SNF.   DVT prophylaxis: eliquis.  Code Status: (Full code) Family Communication: family at bedside.  Disposition:   Status is: Inpatient  Remains inpatient appropriate because:IV treatments appropriate due to intensity of illness or inability to take PO  Dispo: The patient is from: Home              Anticipated d/c is to: Home              Patient currently is not medically stable to d/c.   Difficult to place patient No       Consultants:  Heart failure team.   Procedures: None.   Antimicrobials: none.    Subjective: No sob, or chest pain.   Objective: Vitals:   07/06/20 0334 07/06/20 0727 07/06/20 1111 07/06/20 1500  BP: 105/61 95/60 105/76 106/67  Pulse: 61 63 62 66  Resp: 19 20 16 20   Temp: 98.4 F (36.9 C) 97.8 F (36.6 C) 98.8 F (37.1 C) 98 F (36.7 C)  TempSrc: Oral Oral Oral Oral  SpO2: 100% 100% 100% 100%  Weight: 99.9 kg     Height:        Intake/Output Summary (Last 24 hours) at 07/06/2020 1647 Last data filed at 07/06/2020 1300 Gross per 24 hour  Intake 730 ml  Output --  Net 730 ml    Filed Weights   07/04/20 0649 07/05/20 0550 07/06/20 0334  Weight: 93.1 kg 92.9 kg 99.9 kg    Examination:  General exam: Elderly gentleman, not in distress.  Respiratory system: Air entry fair bilateral no wheezing or rhonchi Cardiovascular system: S1-S2 heard regular rate rhythm, pedal edema present.  Gastrointestinal system: Abdomen is soft non tender non distended, bowel sounds wnl.  Central nervous system: Alert and oriented non focal  Extremities: Pedal edema present.  Skin: Pressure injury in the back Psychiatry: Mood is appropriate.     Data Reviewed: I have personally reviewed following labs and imaging studies  CBC: Recent Labs  Lab 07/02/20 0417 07/04/20 0500  WBC 8.7 8.0  HGB 8.2* 8.2*  HCT 26.9* 27.6*  MCV 77.1* 77.5*  PLT 233 257     Basic Metabolic Panel: Recent Labs  Lab 07/01/20 0409 07/02/20 0417  07/03/20 0459 07/04/20 0500 07/05/20 0530 07/06/20 0340  NA 127* 128* 128* 129* 128* 130*  K 3.7 3.7 3.8 3.6 4.4 4.2  CL 89* 92* 90* 90* 90* 93*  CO2 30 30 29 31 30 30   GLUCOSE 165* 109* 111* 89 89 99  BUN 15 15 20 18 19 20   CREATININE 1.34* 1.28* 1.56* 1.44* 1.52* 1.59*  CALCIUM 7.8* 7.5* 7.6* 8.0* 8.1* 8.0*  MG 1.5* 3.3* 1.7 1.9 1.9 1.8  PHOS 3.3  --   --   --   --   --      GFR: Estimated Creatinine Clearance: 41 mL/min (A) (by C-G formula based on SCr of 1.59 mg/dL (H)).  Liver  Function Tests: Recent Labs  Lab 07/01/20 0409 07/05/20 1100  AST  --  20  ALT  --  22  ALKPHOS  --  98  BILITOT  --  1.0  PROT  --  5.2*  ALBUMIN 1.8* 1.7*     CBG: Recent Labs  Lab 07/05/20 1909 07/05/20 2118 07/06/20 0615 07/06/20 1111 07/06/20 1631  GLUCAP 172* 106* 92 170* 159*      No results found for this or any previous visit (from the past 240 hour(s)).       Radiology Studies: No results found.      Scheduled Meds:  alteplase  2 mg Intracatheter Once   amiodarone  200 mg Oral BID   apixaban  2.5 mg Oral BID   atorvastatin  40 mg Oral Daily   Chlorhexidine Gluconate Cloth  6 each Topical Q0600   digoxin  0.0625 mg Oral Daily   docusate sodium  100 mg Oral Daily   insulin aspart  0-15 Units Subcutaneous TID WC   insulin aspart  0-5 Units Subcutaneous QHS   insulin glargine  10 Units Subcutaneous QHS   midodrine  2.5 mg Oral TID WC   mupirocin ointment   Topical Q12H   pantoprazole  40 mg Oral Q0600   ranolazine  500 mg Oral BID   senna  1 tablet Oral Daily   sodium chloride flush  10-40 mL Intracatheter Q12H   spironolactone  25 mg Oral Daily   tamsulosin  0.4 mg Oral QPC supper   torsemide  20 mg Oral Daily   vitamin B-12  1,000 mcg Oral QHS   Continuous Infusions:  sodium chloride Stopped (06/23/20 0817)   sodium chloride Stopped (06/30/20 0429)   ferumoxytol 510 mg (07/02/20 1429)     LOS: 22 days        Hosie Poisson, MD [facility]   To contact the attending provider between 7A-7P or the covering provider during after hours 7P-7A, please log into the web site [email or web address] and access using universal [place] password for that web site. If you do not have the password, please call the hospital operator.  07/06/2020, 4:47 PM

## 2020-07-06 NOTE — Progress Notes (Signed)
Patient ID: Wesley Winters, male   DOB: [birth year], 80 y.o.   MRN: [id number]     Advanced Heart Failure Rounding Note  PCP-Cardiologist: Sinclair Grooms, MD   Subjective:   Cardiogenic shock. New onset AF.   6/12 CO-OX 43% --->Started on milrinone 0.25 mcg.  6/13 Milrinone increased to 0.375. Co-ox 58% today.  6/15 Back in Afib w/ RVR, amio rebolused, rate increased to 60  6/15 Fever spike + leukocytosis + hypotension>>started on empiric cefepime + NE. BCx with proteus 6/17 TEE/DC-CV briefly in SR 6/19 Back in AF. Milrinone cut back to 0.25 mcg.  6/20 Developed petechiae R fingers.  6/21  Milrinone increased to 0.375 and NE 3 added. 6/24 Attempted to wean milrinone to 0.25 and NE to 2 -> co-ox dropped 48%-> 33% 6/26 NE stopped, milrinone reduced to 0.25  6/29 Failed milrinone wean. CO-OX dropped. Milrinone increased to 0.375 mcg.  6/30 Amio drip switched to po amio.  7/1 milrinone stopped  Off milrinone, does not feel any different.  He did not get out of bed yesterday. Creatinine fairly stable at 1.59.  He remains on NSR on po amiodarone. Cannot get CVP as we are out of the tubing currently.    Objective:   Weight Range: 99.9 kg Body mass index is 33.49 kg/m.   Vital Signs:   Temp:  [97.8 F (36.6 C)-98.8 F (37.1 C)] 98.4 F (36.9 C) (07/03 0334) Pulse Rate:  [61-67] 61 (07/03 0334) Resp:  [19-23] 19 (07/03 0334) BP: (105-118)/(53-67) 105/61 (07/03 0334) SpO2:  [96 %-100 %] 100 % (07/03 0334) Weight:  [99.9 kg] 99.9 kg (07/03 0334) Last BM Date: 07/05/20  Weight change: Filed Weights   07/04/20 0649 07/05/20 0550 07/06/20 0334  Weight: 93.1 kg 92.9 kg 99.9 kg    Intake/Output:   Intake/Output Summary (Last 24 hours) at 07/06/2020 0934 Last data filed at 07/06/2020 0400 Gross per 24 hour  Intake 20 ml  Output --  Net 20 ml      Physical Exam  General: NAD Neck: No JVD, no thyromegaly or thyroid nodule.  Lungs: Clear to auscultation bilaterally with normal  respiratory effort. CV: Nondisplaced PMI.  Heart regular S1/S2, no S3/S4, no murmur.  No peripheral edema.   Abdomen: Soft, nontender, no hepatosplenomegaly, no distention.  Skin: Intact without lesions or rashes.  Neurologic: Alert and oriented x 3.  Psych: Normal affect. Extremities: No clubbing or cyanosis.  HEENT: Normal.    Telemetry   NSR 60s (personally reviewed)  Labs    CBC Recent Labs    07/04/20 0500  WBC 8.0  HGB 8.2*  HCT 27.6*  MCV 77.5*  PLT 631   Basic Metabolic Panel Recent Labs    07/05/20 0530 07/06/20 0340  NA 128* 130*  K 4.4 4.2  CL 90* 93*  CO2 30 30  GLUCOSE 89 99  BUN 19 20  CREATININE 1.52* 1.59*  CALCIUM 8.1* 8.0*  MG 1.9 1.8   Liver Function Tests Recent Labs    07/05/20 1100  AST 20  ALT 22  ALKPHOS 98  BILITOT 1.0  PROT 5.2*  ALBUMIN 1.7*     No results for input(s): LIPASE, AMYLASE in the last 72 hours. Cardiac Enzymes No results for input(s): CKTOTAL, CKMB, CKMBINDEX, TROPONINI in the last 72 hours.  BNP: BNP (last 3 results) Recent Labs    05/26/20 1429 06/15/20 0729  BNP 1,609.8* 2,424.4*    ProBNP (last 3 results) Recent Labs  07/11/19 1107 04/23/20 1446  PROBNP 445 2,488*     D-Dimer No results for input(s): DDIMER in the last 72 hours. Hemoglobin A1C No results for input(s): HGBA1C in the last 72 hours. Fasting Lipid Panel No results for input(s): CHOL, HDL, LDLCALC, TRIG, CHOLHDL, LDLDIRECT in the last 72 hours. Thyroid Function Tests No results for input(s): TSH, T4TOTAL, T3FREE, THYROIDAB in the last 72 hours.  Invalid input(s): FREET3   Other results:   Imaging    No results found.   Medications:     Scheduled Medications:  amiodarone  200 mg Oral BID   apixaban  5 mg Oral BID   atorvastatin  40 mg Oral Daily   Chlorhexidine Gluconate Cloth  6 each Topical Q0600   digoxin  0.0625 mg Oral Daily   docusate sodium  100 mg Oral Daily   insulin aspart  0-15 Units  Subcutaneous TID WC   insulin aspart  0-5 Units Subcutaneous QHS   insulin glargine  10 Units Subcutaneous QHS   midodrine  2.5 mg Oral TID WC   mupirocin ointment   Topical Q12H   pantoprazole  40 mg Oral Q0600   ranolazine  500 mg Oral BID   senna  1 tablet Oral Daily   sodium chloride flush  10-40 mL Intracatheter Q12H   spironolactone  25 mg Oral Daily   tamsulosin  0.4 mg Oral QPC supper   torsemide  20 mg Oral Daily   vitamin B-12  1,000 mcg Oral QHS    Infusions:  sodium chloride Stopped (06/23/20 0817)   sodium chloride Stopped (06/30/20 0429)   ferumoxytol 510 mg (07/02/20 1429)   magnesium sulfate bolus IVPB      PRN Medications:   Assessment/Plan    Acute systolic HF -> 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 Lactate 9.9  - doubt ischemic as hstrop low despite profound hemodynamic support but given h/p CAD and multiple CRFs will need ischemic eval once more stable and renal function improves - Converted to NSR. HR controlled - Failed milrinone wean initially, milrinone then turned off 7/1.  Plan is that if he stabilizes, will send to SNF (would like [facility]). If deteriorates will plan transition to [place] (either home or facility). Code status changed to DNR.  Have stopped checking co-ox and are going by clinical course. So far, seems to be tolerating discontinuation of milrinone.  - Volume status looks ok on torsemide 20 mg daily, unable to get tubing for CVP (hospital is out).  - Continue digoxin 0.0625, level 0.2 today.  - Continue spironolactone 25 mg daily and midodrine to 2.5 tid.  - Renal function stable.    2. CAD - s/p previous LAD stenting last cath 2005 with patent coronaries - doubt ischemia is major factor in HF given low hsTrop, would hold off on cath.  - No chest  pain.  - continue ASA - Continue lipitor 40 mg daily.    3. AF with RVR - Eliquis stopped 6/12 and switched to heparin.  - TEE/DCCV on 6/17-> NSR then went back to AF - Remains in NSR now.  - Transitioned from IV amio to amio 200 mg po twice a day.  - Back on Eliquis  - EP consulted. Not currently a candidate for ablation/CRT-P with wounds.   4.  AKI - due to ATN and shock - peak creatine 2.9 - Creatinine trending up 1.3>1.6>1.4>1.5>1.59   5. Shock liver - resolved   6. DM2 - SSI    7. Hypokalemia  - resolved, K 4.2  8. Sepsis/GN Bacteremia  - PCT 2.7. CXR w/o infiltrate. Foley replaced. BCx Proteus  - Abx narrowed from cefepime to ceftriaxone per ID. Has completed 7 days and off abx.  - sepsis resolved  9. Urinary retention - failed Foley removal - continue Flomax   10. Wound on left foot - deep but clean based - WC recs bid dressing changes   11. Hyponatremia - 6/18 and 6/22 tolvaptan, mentating ok  - Na 132 today - fluid restrict and monitor   12. Petechial rash on R hand/L hand /R foot  - ? Cholesterol emboli. - neurovascular intact. No wound - follow   13. Code status - Now DNR - Palliative following for goals of care, if fails milrinone discontinuation plan for hospice (so far doing ok off milrinone).    54. Deconditioning - PT/OT following, currently recommend SNF  Now appears stable off milrinone, ready for SNF. Needs PT work.   Length of Stay: [place], MD  07/06/2020, 9:34 AM  Advanced Heart Failure Team Pager [phone] (M-F; 7a - 5p)  Please contact [facility] for night-coverage after hours (5p -7a ) and weekends on [email or web address]

## 2020-07-06 NOTE — Plan of Care (Signed)

## 2020-07-06 NOTE — TOC Progression Note (Signed)
Transition of Care [facility]) - Progression Note    Patient Details  Name: Wesley Winters MRN: [id number] Date of Birth: 05/15/1940  Transition of Care [facility]) CM/SW Clipper Burciaga, LCSW Phone Number:[phone] 07/06/2020, 10:06 AM  Clinical Narrative:     CSW alerted RN on progression call that pt would need DNR on chart for transport when medically stable. RN stated that currently there was no DNR chart. CSW reminded her of need for one.  TOC team will continue to assist with discharge planning needs.   Expected Discharge Plan: [place] Barriers to Discharge: Continued Medical Work up  Expected Discharge Plan and Services Expected Discharge Plan: [place] In-house Referral: Clinical Social Work Discharge Planning Services: CM Consult   Living arrangements for the past 2 months: Single Family Home                                       Social Determinants of Health (SDOH) Interventions Food Insecurity Interventions: Intervention Not Indicated Financial Strain Interventions: Other (Comment) (Discuss patient assistance program at [facility] outpatient appointment) Housing Interventions: Intervention Not Indicated Transportation Interventions: Intervention Not Indicated  Readmission Risk Interventions No flowsheet data found.

## 2020-07-07 DIAGNOSIS — I5022 Chronic systolic (congestive) heart failure: Secondary | ICD-10-CM

## 2020-07-07 LAB — BASIC METABOLIC PANEL
Anion gap: 7 (ref 5–15)
BUN: 23 mg/dL (ref 8–23)
CO2: 30 mmol/L (ref 22–32)
Calcium: 8.1 mg/dL — ABNORMAL LOW (ref 8.9–10.3)
Chloride: 93 mmol/L — ABNORMAL LOW (ref 98–111)
Creatinine, Ser: 1.64 mg/dL — ABNORMAL HIGH (ref 0.61–1.24)
GFR, Estimated: 42 mL/min — ABNORMAL LOW (ref >60)
Glucose, Bld: 79 mg/dL (ref 70–99)
Potassium: 4.3 mmol/L (ref 3.5–5.1)
Sodium: 130 mmol/L — ABNORMAL LOW (ref 135–145)

## 2020-07-07 LAB — GLUCOSE, CAPILLARY
Glucose-Capillary: 84 mg/dL (ref 70–99)
Glucose-Capillary: 161 mg/dL — ABNORMAL HIGH (ref 70–99)
Glucose-Capillary: 140 mg/dL — ABNORMAL HIGH (ref 70–99)
Glucose-Capillary: 129 mg/dL — ABNORMAL HIGH (ref 70–99)
Glucose-Capillary: 164 mg/dL — ABNORMAL HIGH (ref 70–99)

## 2020-07-07 LAB — MAGNESIUM: Magnesium: 1.9 mg/dL (ref 1.7–2.4)

## 2020-07-07 MED ORDER — AMIODARONE HCL 200 MG PO TABS
200.0000 mg | ORAL_TABLET | Freq: Every day | ORAL | Status: DC
  Administered 2020-07-07 – 2020-07-09 (×3): 200 mg via ORAL
  Filled 2020-07-07 (×3): qty 1

## 2020-07-07 MED ORDER — MAGNESIUM SULFATE 2 GM/50ML IV SOLN
2.0000 g | Freq: Once | INTRAVENOUS | Status: AC
  Administered 2020-07-07: 2 g via INTRAVENOUS
  Filled 2020-07-07: qty 50

## 2020-07-07 NOTE — Plan of Care (Signed)
  Problem: Education: Goal: Knowledge of General Education information will improve Description Including pain rating scale, medication(s)/side effects and non-pharmacologic comfort measures Outcome: Progressing   Problem: Health Behavior/Discharge Planning: Goal: Ability to manage health-related needs will improve Outcome: Progressing   Problem: Clinical Measurements: Goal: Ability to maintain clinical measurements within normal limits will improve Outcome: Progressing Goal: Will remain free from infection Outcome: Progressing Goal: Diagnostic test results will improve Outcome: Progressing Goal: Respiratory complications will improve Outcome: Progressing Goal: Cardiovascular complication will be avoided Outcome: Progressing   Problem: Activity: Goal: Risk for activity intolerance will decrease Outcome: Progressing   Problem: Nutrition: Goal: Adequate nutrition will be maintained Outcome: Progressing   Problem: Elimination: Goal: Will not experience complications related to bowel motility Outcome: Progressing Goal: Will not experience complications related to urinary retention Outcome: Progressing   Problem: Pain Managment: Goal: General experience of comfort will improve Outcome: Progressing   

## 2020-07-07 NOTE — Progress Notes (Signed)
  Heart failure team will sign off as of 07/07/20  HF Team Medication Recommendations for Home: Digoxin 0.0625 mg daily Amiodarone 200 mg daily Eliqiuis 2.5 mg twice a day Atrovastatin 40 mg daily  Midodrine 2.5 mg three times a day  Ranexa 500 mg twice a day  Spironolactone 25 mg daily  Torsemide 20 mg daily    Other recommendations (Labs,testing, etc): No   Follow up as an outpatient: We will set up HF  follow up .   Elliannah Wayment NP-C  11:15 AM

## 2020-07-07 NOTE — Progress Notes (Signed)
PT Cancellation Note  Patient Details Name: Wesley Winters MRN: [id number] DOB: 18-Apr-1940   Cancelled Treatment:    Reason Eval/Treat Not Completed: Other (comment)  Bedrest after PICC pulled;   Will follow up later today as time allows;  Otherwise, will follow up for PT tomorrow;   Thank you,  Roney Marion, PT  Acute Rehabilitation Services Pager [phone] Office [place] 07/07/2020, 10:02 AM

## 2020-07-07 NOTE — Progress Notes (Signed)
PROGRESS NOTE    Wesley Winters  FIE:[id number] DOB: 05/19/1940 DOA: 06/14/2020 PCP: [name], MD    Chief Complaint  Patient presents with   Weakness    Brief Narrative:   Patient is 80 year old male with history of severe systolic congestive heart failure with ejection fraction 25% suspected to be from tachycardia induced, A. fib, coronary artery disease status post stenting, diabetes type 2 who initially presented to the emergency room with complaints of poor appetite, weakness, fall.  On presentation he was hypotensive, found to have intermittent episodes of A. fib with RVR.  Lab work showed worsening kidney function with creatinine in the range of 3, elevated liver enzymes.  Patient was admitted for the management of cardiogenic shock.  Cardiology was primary team 06/24/2020, transferred to [facility] service on 06/25/2020. He was weaned off milrinone gtt and amiodarone gtt. Pt seen and examined, no new complaints today.  Assessment & Plan:   Principal Problem:   Cardiogenic shock ([place]) Active Problems:   Diabetes ([place])   CAD in native artery   Persistent atrial fibrillation (HCC)   Chronic anticoagulation   ARF (acute renal failure) (HCC)   PAF (paroxysmal atrial fibrillation) (HCC)   Pressure injury of skin   Acute on chronic systolic CHF: ECHO showed LVEF of 20 to 25%, weaned off levophed , off milrinone gtt,  along with torsemide 20 mg daily and aldactone 12.5 mg daily.  Continue with strict intake and output.  Daily weights.  Creatinine continues to slowly creep up to 1.6 Cardiology/ HF team on board.    Paroxysmal atrial fib with RVR S/P tee/ DCCV on 6/17, currently in sinus rhythm.  Weaned off amiodarone gtt, remains on digoxin.  On eliquis for anti coagulation.  Continue with amiodarone daily.     AKI:  SEC to cardiogenic shock/ ATN.  Creatinine stable around 1.6, continues to slowly 1.5.    Elevated liver enzymes:  Congestive hepatopathy. Pt denies any  nausea, or vomiting.  Continue to monitor.    Type 2 DM:  Insulin dependent, continue with SSI.  CBG (last 3)  Recent Labs    07/07/20 0559 07/07/20 0830 07/07/20 1140  GLUCAP 84 161* 140*    Resume lantus and SSI.  No changes in meds.    Anemia of chronic disease:  Transfuse to keep hemoglobin greater than 7. Hemoglobin of 8.2.    Acute urinary retention:  Resolved.    Left foot wound/ Pressure injury.  Pressure Injury 06/25/20 Buttocks Left Stage 2 -  Partial thickness loss of dermis presenting as a shallow open injury with a red, pink wound bed without slough. 2.7 cm X 2.0 cm open stage 2, surrounding area is blanchable (Active)  06/25/20 0810  Location: Buttocks  Location Orientation: Left  Staging: Stage 2 -  Partial thickness loss of dermis presenting as a shallow open injury with a red, pink wound bed without slough.  Wound Description (Comments): 2.7 cm X 2.0 cm open stage 2, surrounding area is blanchable  Present on Admission: No     Pressure Injury 06/25/20 Buttocks Right Stage 2 -  Partial thickness loss of dermis presenting as a shallow open injury with a red, pink wound bed without slough. 2.3 cm X 1.0 cm stage 2, surrounding area is blanchable (Active)  06/25/20 0811  Location: Buttocks  Location Orientation: Right  Staging: Stage 2 -  Partial thickness loss of dermis presenting as a shallow open injury with a red, pink wound bed without slough.  Wound Description (Comments): 2.3 cm X 1.0 cm stage 2, surrounding area is blanchable  Present on Admission: No  Wound care consulted and recommendations given.    Hyponatremia:  Resolved. 130 today.    Sepsis sec to proteus mirablilis bacteremia sec to UTI.  Completed 7 days of IV rocephin.  Sepsis physiology resolved.     Body mass index is 32.95 kg/m. Obesity.    GERD:  Protonix started.   Deconditioning Therapy evaluations show SNF.   DVT prophylaxis: eliquis.  Code Status: (Full  code) Family Communication: family at bedside.  Disposition:   Status is: Inpatient  Remains inpatient appropriate because:IV treatments appropriate due to intensity of illness or inability to take PO  Dispo: The patient is from: Home              Anticipated d/c is to: Home              Patient currently is not medically stable to d/c.   Difficult to place patient No       Consultants:  Heart failure team.   Procedures: None.   Antimicrobials: none.    Subjective: No new complaints today.   Objective: Vitals:   07/07/20 0642 07/07/20 0644 07/07/20 0717 07/07/20 1134  BP:  113/61 (!) 105/56 127/73  Pulse:  61 65   Resp:  16 18 17   Temp:   97.6 F (36.4 C) 97.8 F (36.6 C)  TempSrc:   Oral Oral  SpO2:   96% 95%  Weight: 98.3 kg     Height:       No intake or output data in the 24 hours ending 07/07/20 1501  Filed Weights   07/05/20 0550 07/06/20 0334 07/07/20 0642  Weight: 92.9 kg 99.9 kg 98.3 kg    Examination:  General exam: Elderly gentleman, not in distress.  Respiratory system: air entry fair, no wheezing heard, on RA.  Cardiovascular system: S1-S2 heard, no JVD, RRR, no pedal edema.  Gastrointestinal system: Abdomen is soft, NT ND BS+ Central nervous system: Alert and answering questions appropriately.  Extremities: Pedal edema bilateral.  Skin: Pressure injury in the back Psychiatry: Mood is appropriate.     Data Reviewed: I have personally reviewed following labs and imaging studies  CBC: Recent Labs  Lab 07/02/20 0417 07/04/20 0500  WBC 8.7 8.0  HGB 8.2* 8.2*  HCT 26.9* 27.6*  MCV 77.1* 77.5*  PLT 233 257     Basic Metabolic Panel: Recent Labs  Lab 07/01/20 0409 07/02/20 0417 07/03/20 0459 07/04/20 0500 07/05/20 0530 07/06/20 0340 07/07/20 0540  NA 127*   < > 128* 129* 128* 130* 130*  K 3.7   < > 3.8 3.6 4.4 4.2 4.3  CL 89*   < > 90* 90* 90* 93* 93*  CO2 30   < > 29 31 30 30 30   GLUCOSE 165*   < > 111* 89 89 99 79   BUN 15   < > 20 18 19 20 23   CREATININE 1.34*   < > 1.56* 1.44* 1.52* 1.59* 1.64*  CALCIUM 7.8*   < > 7.6* 8.0* 8.1* 8.0* 8.1*  MG 1.5*   < > 1.7 1.9 1.9 1.8 1.9  PHOS 3.3  --   --   --   --   --   --    < > = values in this interval not displayed.     GFR: Estimated Creatinine Clearance: 39.5 mL/min (A) (by C-G formula based on SCr  of 1.64 mg/dL (H)).  Liver Function Tests: Recent Labs  Lab 07/01/20 0409 07/05/20 1100  AST  --  20  ALT  --  22  ALKPHOS  --  98  BILITOT  --  1.0  PROT  --  5.2*  ALBUMIN 1.8* 1.7*     CBG: Recent Labs  Lab 07/06/20 1631 07/06/20 1912 07/07/20 0559 07/07/20 0830 07/07/20 1140  GLUCAP 159* 91 84 161* 140*      No results found for this or any previous visit (from the past 240 hour(s)).       Radiology Studies: No results found.      Scheduled Meds:  alteplase  2 mg Intracatheter Once   amiodarone  200 mg Oral Daily   apixaban  2.5 mg Oral BID   atorvastatin  40 mg Oral Daily   Chlorhexidine Gluconate Cloth  6 each Topical Q0600   digoxin  0.0625 mg Oral Daily   docusate sodium  100 mg Oral Daily   insulin aspart  0-15 Units Subcutaneous TID WC   insulin aspart  0-5 Units Subcutaneous QHS   insulin glargine  10 Units Subcutaneous QHS   midodrine  2.5 mg Oral TID WC   mupirocin ointment   Topical Q12H   pantoprazole  40 mg Oral Q0600   ranolazine  500 mg Oral BID   senna  1 tablet Oral Daily   sodium chloride flush  10-40 mL Intracatheter Q12H   spironolactone  25 mg Oral Daily   tamsulosin  0.4 mg Oral QPC supper   torsemide  20 mg Oral Daily   vitamin B-12  1,000 mcg Oral QHS   Continuous Infusions:  sodium chloride Stopped (06/23/20 0817)   sodium chloride Stopped (06/30/20 0429)   ferumoxytol 510 mg (07/02/20 1429)     LOS: 23 days        Hosie Poisson, MD [facility]   To contact the attending provider between 7A-7P or the covering provider during after hours 7P-7A, please log into the  web site [email or web address] and access using universal  password for that web site. If you do not have the password, please call the hospital operator.  07/07/2020, 3:01 PM

## 2020-07-07 NOTE — Progress Notes (Signed)
Patient's spouse Inez Catalina called returning night shift's phone call.  Relayed the message that patient was lowered to the floor when legs got weak but there was no injury. Inez Catalina thanked [place] for calling and she will be in later this am.

## 2020-07-07 NOTE — Plan of Care (Signed)

## 2020-07-07 NOTE — Progress Notes (Signed)
   07/07/20 0644  What Happened  Was fall witnessed? Yes  Who witnessed fall? Allie Dimmer, NT  Patients activity before fall other (comment) (daily standing weight)  Point of contact buttocks  Was patient injured? No  Follow Up  MD notified Dr. Nevada Crane  Time MD notified 0700 (text page)  Family notified No - patient refusal (attempted to call wife on home and cell line, no answer)  Time family notified 0711 (unsucessful attempt)  Progress note created (see row info) Yes  Adult Fall Risk Assessment  Risk Factor Category (scoring not indicated) Fall has occurred during this admission (document High fall risk)  Age 80  Fall History: Fall within 6 months prior to admission 5  Elimination; Bowel and/or Urine Incontinence 2  Elimination; Bowel and/or Urine Urgency/Frequency 2  Medications: includes PCA/Opiates, Anti-convulsants, Anti-hypertensives, Diuretics, Hypnotics, Laxatives, Sedatives, and Psychotropics 5  Patient Care Equipment 2  Mobility-Assistance 2  Mobility-Gait 2  Mobility-Sensory Deficit 0  Altered awareness of immediate physical environment 0  Impulsiveness 0  Lack of understanding of one's physical/cognitive limitations 0  Total Score 23  Patient Fall Risk Level High fall risk  Adult Fall Risk Interventions  Required Bundle Interventions *See Row Information* High fall risk - low, moderate, and high requirements implemented  Additional Interventions PT/OT need assessed if change in mobility from baseline;Use of appropriate toileting equipment (bedpan, BSC, etc.)  Screening for Fall Injury Risk (To be completed on HIGH fall risk patients) - Assessing Need for Floor Mats  Risk For Fall Injury- Criteria for Floor Mats Previous fall this admission  Will Implement Floor Mats Yes  Vitals  BP 113/61  MAP (mmHg) 76  BP Location Right Arm  BP Method Automatic  Patient Position (if appropriate) Lying  Pulse Rate 61  Pulse Rate Source Monitor  ECG Heart Rate 61  Resp 16   Neurological  Level of Consciousness Alert

## 2020-07-07 NOTE — Progress Notes (Signed)
Patient wearing CPAP when RT entered room. No respiratory distress noted. RT will monitor as needed.

## 2020-07-07 NOTE — Progress Notes (Addendum)
Patient ID: Wesley Winters, male   DOB: 01-Jun-1940, 80 y.o.   MRN: [id number]     Advanced Heart Failure Rounding Note  PCP-Cardiologist: Sinclair Grooms, MD   Subjective:   Cardiogenic shock. New onset AF.   6/12 CO-OX 43% --->Started on milrinone 0.25 mcg.  6/13 Milrinone increased to 0.375. Co-ox 58% today.  6/15 Back in Afib w/ RVR, amio rebolused, rate increased to 60  6/15 Fever spike + leukocytosis + hypotension>>started on empiric cefepime + NE. BCx with proteus 6/17 TEE/DC-CV briefly in SR 6/19 Back in AF. Milrinone cut back to 0.25 mcg.  6/20 Developed petechiae R fingers.  6/21  Milrinone increased to 0.375 and NE 3 added. 6/24 Attempted to wean milrinone to 0.25 and NE to 2 -> co-ox dropped 48%-> 33% 6/26 NE stopped, milrinone reduced to 0.25  6/29 Failed milrinone wean. CO-OX dropped. Milrinone increased to 0.375 mcg.  6/30 Amio drip switched to po amio.  7/1 milrinone stopped 7/4 Assisted fall when standing on the scale. Staff assisted to floor.   Denies SOB. Feels ok.   Objective:   Weight Range: 98.3 kg Body mass index is 32.95 kg/m.   Vital Signs:   Temp:  [97.6 F (36.4 C)-98.8 F (37.1 C)] 97.6 F (36.4 C) (07/04 0717) Pulse Rate:  [59-66] 61 (07/04 0644) Resp:  [14-20] 18 (07/04 0717) BP: (95-127)/(56-77) 105/56 (07/04 0717) SpO2:  [96 %-100 %] 96 % (07/04 0717) Weight:  [98.3 kg] 98.3 kg (07/04 0642) Last BM Date: 07/05/20  Weight change: Filed Weights   07/05/20 0550 07/06/20 0334 07/07/20 0642  Weight: 92.9 kg 99.9 kg 98.3 kg    Intake/Output:   Intake/Output Summary (Last 24 hours) at 07/07/2020 0725 Last data filed at 07/06/2020 1300 Gross per 24 hour  Intake 720 ml  Output --  Net 720 ml      Physical Exam  General:  . No resp difficulty HEENT: normal Neck: supple. JVP 7-8. Carotids 2+ bilat; no bruits. No lymphadenopathy or thryomegaly appreciated. Cor: PMI nondisplaced. Regular rate & rhythm. No rubs, gallops or murmurs. Lungs:  clear Abdomen: soft, nontender, nondistended. No hepatosplenomegaly. No bruits or masses. Good bowel sounds. Extremities: no cyanosis, clubbing, rash, Rand LLE trace-1+ edema. LUE PICC  Neuro: alert & orientedx3, cranial nerves grossly intact. moves all 4 extremities w/o difficulty. Affect pleasant    Telemetry   NSR 60s personally reviewed.   Labs    CBC No results for input(s): WBC, NEUTROABS, HGB, HCT, MCV, PLT in the last 72 hours.  Basic Metabolic Panel Recent Labs    07/06/20 0340 07/07/20 0540  NA 130* 130*  K 4.2 4.3  CL 93* 93*  CO2 30 30  GLUCOSE 99 79  BUN 20 23  CREATININE 1.59* 1.64*  CALCIUM 8.0* 8.1*  MG 1.8 1.9   Liver Function Tests Recent Labs    07/05/20 1100  AST 20  ALT 22  ALKPHOS 98  BILITOT 1.0  PROT 5.2*  ALBUMIN 1.7*     No results for input(s): LIPASE, AMYLASE in the last 72 hours. Cardiac Enzymes No results for input(s): CKTOTAL, CKMB, CKMBINDEX, TROPONINI in the last 72 hours.  BNP: BNP (last 3 results) Recent Labs    05/26/20 1429 06/15/20 0729  BNP 1,609.8* 2,424.4*    ProBNP (last 3 results) Recent Labs    07/11/19 1107 04/23/20 1446  PROBNP 445 2,488*     D-Dimer No results for input(s): DDIMER in the last 72 hours. Hemoglobin  A1C No results for input(s): HGBA1C in the last 72 hours. Fasting Lipid Panel No results for input(s): CHOL, HDL, LDLCALC, TRIG, CHOLHDL, LDLDIRECT in the last 72 hours. Thyroid Function Tests No results for input(s): TSH, T4TOTAL, T3FREE, THYROIDAB in the last 72 hours.  Invalid input(s): FREET3   Other results:   Imaging    No results found.   Medications:     Scheduled Medications:  alteplase  2 mg Intracatheter Once   amiodarone  200 mg Oral BID   apixaban  2.5 mg Oral BID   atorvastatin  40 mg Oral Daily   Chlorhexidine Gluconate Cloth  6 each Topical Q0600   digoxin  0.0625 mg Oral Daily   docusate sodium  100 mg Oral Daily   insulin aspart  0-15 Units  Subcutaneous TID WC   insulin aspart  0-5 Units Subcutaneous QHS   insulin glargine  10 Units Subcutaneous QHS   midodrine  2.5 mg Oral TID WC   mupirocin ointment   Topical Q12H   pantoprazole  40 mg Oral Q0600   ranolazine  500 mg Oral BID   senna  1 tablet Oral Daily   sodium chloride flush  10-40 mL Intracatheter Q12H   spironolactone  25 mg Oral Daily   tamsulosin  0.4 mg Oral QPC supper   torsemide  20 mg Oral Daily   vitamin B-12  1,000 mcg Oral QHS    Infusions:  sodium chloride Stopped (06/23/20 0817)   sodium chloride Stopped (06/30/20 0429)   ferumoxytol 510 mg (07/02/20 1429)    PRN Medications:   Assessment/Plan    Acute systolic HF -> 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 Lactate 9.9  - doubt ischemic as hstrop low despite profound hemodynamic support but given h/p CAD and multiple CRFs will need ischemic eval once more stable and renal function improves - Converted to NSR. HR controlled - Failed milrinone wean initially, milrinone then turned off 7/1.  Plan is that if he stabilizes, will send to SNF. If deteriorates will plan transition to [place] (either home or facility). Code status changed to DNR.  Have stopped checking co-ox and are going by clinical course. So far, seems to be tolerating discontinuation of milrinone.  - Volume status stable. Continue torsemide 20 mg daily - Continue digoxin 0.0625, level 0.2 today.  - Continue spironolactone 25 mg daily and midodrine to 2.5 tid.  - Renal function stable.    2. CAD - s/p previous LAD stenting last cath 2005 with patent coronaries - doubt ischemia is major factor in HF given low hsTrop, would hold off on cath.  - No chest apin.  - continue ASA - Continue lipitor 40 mg daily.    3. AF with RVR - Eliquis stopped 6/12 and  switched to heparin.  - TEE/DCCV on 6/17-> NSR then went back to AF - Maintaining NSR.  Mag 1.9 Give 2 grams now. K stable.  - Transitioned from IV amio to po amio. Cut back amio to 200 mg daily.   - Back on Eliquis  - EP consulted. Not currently a candidate for ablation/CRT-P with wounds.   4.  AKI - due to ATN and shock - peak creatine 2.9 - Creatinine trending up slightly 1.4>1.5>1.59>1.64    5. Shock liver - resolved   6.  DM2 - SSI    7. Hypokalemia  - resolved, K 4.3  8. Sepsis/GN Bacteremia  - PCT 2.7. CXR w/o infiltrate. Foley replaced. BCx Proteus  - Abx narrowed from cefepime to ceftriaxone per ID. Has completed 7 days and off abx.  - sepsis resolved  9. Urinary retention -- continue Flomax   10. Wound on left foot - deep but clean based - WC recs bid dressing changes   11. Hyponatremia - 6/18 and 6/22 tolvaptan, mentating ok  - Na 130 today - fluid restrict and monitor   12. Petechial rash on R hand/L hand /R foot  - ? Cholesterol emboli. - neurovascular intact. No wound -Resolved.   13. Code status - Now DNR - Palliative following for goals of care, if fails milrinone discontinuation plan for hospice (so far doing ok off milrinone).    14. Deconditioning - PT/OT following, currently recommend SNF    Plan for SNF once be available.  Can remove PICC    Length of Stay: [place], NP  07/07/2020, 7:25 AM  Advanced Heart Failure Team Pager [phone] (M-F; 7a - 5p)  Please contact [facility] for night-coverage after hours (5p -7a ) and weekends on [email or web address]  Patient seen with NP, agree with the above note.   Feels weak but otherwise ok, no dyspnea.  Remains in NSR. SBP 100s-110s.   General: NAD Neck: JVP 7-8 cm, no thyromegaly or thyroid nodule.  Lungs: Clear to auscultation bilaterally with normal respiratory effort. CV: Nondisplaced PMI.  Heart regular S1/S2, no S3/S4, no murmur.  No peripheral edema.   Abdomen: Soft, nontender, no  hepatosplenomegaly, no distention.  Skin: Intact without lesions or rashes.  Neurologic: Alert and oriented x 3.  Psych: Normal affect. Extremities: No clubbing or cyanosis.  HEENT: Normal.   No changes to med regimen except can cut back amiodarone to once daily, would continue on low dose midodrine for now.   Ready for SNF when there is a bed.  Will need extensive PT work.   Loralie Champagne 07/07/2020 10:19 AM

## 2020-07-07 NOTE — TOC Progression Note (Addendum)
Transition of Care (TOC) - Progression Note  Heart Failure   Patient Details  Name: Wesley Winters MRN: [id number] Date of Birth: [birth year]  Transition of Care [facility]) CM/SW [place], [place] Phone Number: 07/07/2020, 9:35 AM  Clinical Narrative:    CSW received a secure chat from the cardiologist about the patient being ready for discharge and that he is off of the milrinone. CSW callded [place] who reported that they don't have a bed available today but possibly tomorrow but to call and check as they are waiting on patients to discharge from their facility.  Health insurance authorization started on [employer] online portal Bernadene Bell Authorization ID#: [id number] pending approval. 11:31am - Insurance authorization approved from 07/08/2020 - 07/10/2020.  DNR is on the front of the patients chart and CSW sent a secure chat to the attending MD for signature when available.  CSW will continue to follow throughout discharge.   Expected Discharge Plan: [place] Barriers to Discharge: Continued Medical Work up  Expected Discharge Plan and Services Expected Discharge Plan: [place] In-house Referral: Clinical Social Work Discharge Planning Services: CM Consult   Living arrangements for the past 2 months: Single Family Home                                       Social Determinants of Health (SDOH) Interventions Food Insecurity Interventions: Intervention Not Indicated Financial Strain Interventions: Other (Comment) (Discuss patient assistance program at [facility] outpatient appointment) Housing Interventions: Intervention Not Indicated Transportation Interventions: Intervention Not Indicated  Readmission Risk Interventions No flowsheet data found.  Pradeep Beaubrun, MSW, [place] Heart Failure Social Worker

## 2020-07-08 LAB — GLUCOSE, CAPILLARY
Glucose-Capillary: 97 mg/dL (ref 70–99)
Glucose-Capillary: 200 mg/dL — ABNORMAL HIGH (ref 70–99)
Glucose-Capillary: 133 mg/dL — ABNORMAL HIGH (ref 70–99)
Glucose-Capillary: 115 mg/dL — ABNORMAL HIGH (ref 70–99)

## 2020-07-08 LAB — MAGNESIUM: Magnesium: 2 mg/dL (ref 1.7–2.4)

## 2020-07-08 MED ORDER — RANOLAZINE ER 500 MG PO TB12: 500.0000 mg | ORAL_TABLET | Freq: Two times a day (BID) | ORAL | 1 refills | Status: AC

## 2020-07-08 MED ORDER — SENNA 8.6 MG PO TABS: 1.0000 | ORAL_TABLET | Freq: Every day | ORAL | 0 refills | Status: AC

## 2020-07-08 MED ORDER — DIGOXIN 62.5 MCG PO TABS: 0.0625 mg | ORAL_TABLET | Freq: Every day | ORAL | 0 refills | Status: DC

## 2020-07-08 MED ORDER — APIXABAN 2.5 MG PO TABS: 2.5000 mg | ORAL_TABLET | Freq: Two times a day (BID) | ORAL | 2 refills | Status: AC

## 2020-07-08 MED ORDER — AMIODARONE HCL 200 MG PO TABS
200.0000 mg | ORAL_TABLET | Freq: Every day | ORAL | 2 refills | Status: DC
Start: 2020-07-09 — End: 2020-07-18

## 2020-07-08 MED ORDER — MIDODRINE HCL 2.5 MG PO TABS: 2.5000 mg | ORAL_TABLET | Freq: Three times a day (TID) | ORAL | 2 refills | Status: AC

## 2020-07-08 MED ORDER — DOCUSATE SODIUM 100 MG PO CAPS: 100.0000 mg | ORAL_CAPSULE | Freq: Every day | ORAL | 0 refills | Status: AC

## 2020-07-08 MED ORDER — PANTOPRAZOLE SODIUM 40 MG PO TBEC: 40.0000 mg | DELAYED_RELEASE_TABLET | Freq: Every day | ORAL | 0 refills | Status: AC

## 2020-07-08 MED ORDER — TOUJEO MAX SOLOSTAR 300 UNIT/ML ~~LOC~~ SOPN: 10.0000 [IU] | PEN_INJECTOR | Freq: Every day | SUBCUTANEOUS | 0 refills | Status: AC

## 2020-07-08 MED ORDER — TAMSULOSIN HCL 0.4 MG PO CAPS
0.4000 mg | ORAL_CAPSULE | Freq: Every day | ORAL | 2 refills | Status: AC
Start: 2020-07-08 — End: ?

## 2020-07-08 MED ORDER — NITROGLYCERIN 0.4 MG SL SUBL: 0.4000 mg | SUBLINGUAL_TABLET | SUBLINGUAL | 12 refills | Status: AC | PRN

## 2020-07-08 MED ORDER — SPIRONOLACTONE 25 MG PO TABS
25.0000 mg | ORAL_TABLET | Freq: Every day | ORAL | 2 refills | Status: AC
Start: 2020-07-09 — End: ?

## 2020-07-08 MED ORDER — TORSEMIDE 20 MG PO TABS: 20.0000 mg | ORAL_TABLET | Freq: Every day | ORAL | 2 refills | Status: AC

## 2020-07-08 NOTE — Discharge Summary (Signed)
Physician Discharge Summary  Wesley Winters QQP:[id number] DOB: [birth year] DOA: 06/14/2020  PCP: [name], MD  Admit date: 06/14/2020 Discharge date: 07/09/2020  Admitted From: Home Disposition:  SNF  Recommendations for Outpatient Follow-up:  Follow up with PCP in 1-2 weeks Please obtain BMP/CBC in one week Please follow up with cardiology as recommended.     Discharge Condition:Guarded.  CODE STATUS:full code. 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 fib, coronary artery disease status post stenting, diabetes type 2 who initially presented to the emergency room with complaints of poor appetite, weakness, fall.  On presentation he was hypotensive, found to have intermittent episodes of A. fib with RVR.  Lab work showed worsening kidney function with creatinine in the range of 3, elevated liver enzymes.  Patient was admitted for the management of cardiogenic shock.  Cardiology was primary team 06/24/2020, transferred to [facility] service on 06/25/2020. He was weaned off milrinone gtt and amiodarone gtt.  Discharge Diagnoses:  Principal Problem:   Cardiogenic shock ([place]) Active Problems:   Diabetes ([place])   CAD in native artery   Persistent atrial fibrillation (HCC)   Chronic anticoagulation   ARF (acute renal failure) (HCC)   PAF (paroxysmal atrial fibrillation) (HCC)   Pressure injury of skin   Acute on chronic systolic CHF: ECHO showed LVEF of 20 to 25%, weaned off levophed , off milrinone gtt,  along with torsemide 20 mg daily and spironolactone 25 mg daily.  Continue with strict intake and output. Daily weights.  Creatinine continues to slowly creep up to 1.6.  Cardiology/ HF team on board.     Paroxysmal atrial fib with RVR S/P tee/ DCCV on 6/17, currently in sinus rhythm.  Weaned off amiodarone gtt,  remains on digoxin. On eliquis for anti coagulation.  Continue with amiodarone daily.        AKI: SEC to cardiogenic shock/ ATN. Creatinine stable around 1.6,.     Elevated liver enzymes: Congestive hepatopathy. Pt denies any nausea, or vomiting.  Continue to monitor.     Type 2 DM: Insulin dependent, continue with SSI. CBG (last 3)  Recent Labs    07/07/20 2156 07/08/20 0639 07/08/20 1103  GLUCAP 164* 97 200*    Resume lantus and SSI. No changes in meds.     Anemia of chronic disease: Transfuse to keep hemoglobin greater than 7. Hemoglobin of 8.2.     Acute urinary retention: Resolved.     Left foot wound/ Pressure injury.  Pressure Injury 06/25/20 Buttocks Left Stage 2 -  Partial thickness loss of dermis presenting as a shallow open injury with a red, pink wound bed without slough. 2.7 cm X 2.0 cm open stage 2, surrounding area is blanchable (Active)  06/25/20 0810  Location: Buttocks  Location Orientation: Left  Staging: Stage 2 -  Partial thickness loss of dermis presenting as a shallow open injury with a red, pink wound bed without slough.  Wound Description (Comments): 2.7 cm X 2.0 cm open stage 2, surrounding area is blanchable  Present on Admission: No     Pressure Injury 06/25/20 Buttocks Right Stage 2 -  Partial thickness loss of dermis presenting as a shallow open injury with a red, pink wound bed without slough. 2.3 cm X 1.0 cm stage 2, surrounding area is blanchable (Active)  06/25/20 0811  Location: Buttocks  Location Orientation: Right  Staging: Stage 2 -  Partial thickness loss of dermis presenting as a shallow open injury with a red, pink wound bed without slough.  Wound Description (Comments): 2.3 cm X 1.0 cm stage 2, surrounding area is blanchable  Present on Admission: No  Wound care consulted and recommendations given.     Hyponatremia: Resolved. 130 today.     Sepsis sec to proteus mirablilis bacteremia sec to UTI. Completed 7 days  of IV rocephin. Sepsis physiology resolved.        Body mass index is 32.95 kg/m. Obesity.     GERD:  Protonix started.    Deconditioning Therapy evaluations show SNF.  Discharge Instructions   Allergies as of 07/08/2020       Reactions   Metformin Hcl Diarrhea   Other Other (See Comments), Cough   Pollen- Chest congestion, also   Amoxicillin Hives, Nausea Only, Rash   Amoxicillin-pot Clavulanate Rash   Empagliflozin Nausea Only, Anxiety, Other (See Comments)   Made the patient jittery and nauseous   Januvia [sitagliptin] Anxiety        Medication List     STOP taking these medications    azelastine 0.1 % nasal spray Commonly known as: ASTELIN   carvedilol 3.125 MG tablet Commonly known as: Coreg   furosemide 20 MG tablet Commonly known as: LASIX   metFORMIN 500 MG 24 hr tablet Commonly known as: GLUCOPHAGE-XR       TAKE these medications    acetaminophen 500 MG tablet Commonly known as: TYLENOL Take 500-1,000 mg by mouth 3 (three) times daily as needed for moderate pain, mild pain or headache.   amiodarone 200 MG tablet Commonly known as: PACERONE Take 1 tablet (200 mg total) by mouth daily. Start taking on: July 09, 2020 What changed:  how much to take when to take this   apixaban 2.5 MG Tabs tablet Commonly known as: ELIQUIS Take 1 tablet (2.5 mg total) by mouth 2 (two) times daily. What changed:  medication strength how much to take   atorvastatin 40 MG tablet Commonly known as: LIPITOR Take 40 mg by mouth daily.   B-D UF III MINI PEN NEEDLES 31G X 5 MM Misc Generic drug: Insulin Pen Needle 1 each by Other route every evening.   cetirizine 10 MG tablet Commonly known as: ZYRTEC Take 10 mg by mouth in the morning.   cholecalciferol 25 MCG (1000 UNIT) tablet Commonly known as: VITAMIN D3 Take 1,000 Units by mouth at bedtime.   Digoxin 62.5 MCG Tabs Take 0.0625 mg by mouth daily. Start taking on: July 09, 2020   docusate  sodium 100 MG capsule Commonly known as: COLACE Take 1 capsule (100 mg total) by mouth daily. Start taking on: July 09, 2020   fluticasone 50 MCG/ACT nasal spray Commonly known as: FLONASE Place 2 sprays into both nostrils daily as needed for allergies.   midodrine 2.5 MG tablet Commonly known as: PROAMATINE Take 1 tablet (2.5 mg total) by mouth 3 (three) times daily with meals.   mupirocin ointment 2 % Commonly known as: BACTROBAN APPLY TO RIGHT GREAT TOE AND RIGHT 3RD TOE ONCE DAILY What changed: See the new instructions.   nitroGLYCERIN 0.4 MG SL tablet Commonly known as: NITROSTAT Place 1 tablet (0.4 mg total) under the tongue every 5 (five) minutes as needed for chest pain.   OneTouch Ultra test strip Generic drug: glucose blood 1 each 3 (three) times daily.   pantoprazole 40 MG tablet Commonly known as: PROTONIX Take  1 tablet (40 mg total) by mouth daily at 6 (six) AM. Start taking on: July 09, 2020   PRESCRIPTION MEDICATION CPAP- At bedtime and during any naps   ranolazine 500 MG 12 hr tablet Commonly known as: RANEXA Take 1 tablet (500 mg total) by mouth 2 (two) times daily.   senna 8.6 MG Tabs tablet Commonly known as: SENOKOT Take 1 tablet (8.6 mg total) by mouth daily. Start taking on: July 09, 2020   spironolactone 25 MG tablet Commonly known as: ALDACTONE Take 1 tablet (25 mg total) by mouth daily. Start taking on: July 09, 2020   tamsulosin 0.4 MG Caps capsule Commonly known as: FLOMAX Take 1 capsule (0.4 mg total) by mouth daily after supper.   torsemide 20 MG tablet Commonly known as: DEMADEX Take 1 tablet (20 mg total) by mouth daily. Start taking on: July 09, 2020   Toujeo Max SoloStar 300 UNIT/ML Solostar Pen Generic drug: insulin glargine (2 Unit Dial) Inject 10 Units into the skin at bedtime. What changed: how much to take   vitamin B-12 1000 MCG tablet Commonly known as: CYANOCOBALAMIN Take 1,000 mcg by mouth at bedtime.         Follow-up Information     [facility] Follow up on 07/18/2020.   Specialty: Cardiology Why: at 2:00 . Located in the [place] at [facility]. Contact information: [address] [id number] [place] [address] [phone]               Allergies  Allergen Reactions   Metformin Hcl Diarrhea   Other Other (See Comments) and Cough    Pollen- Chest congestion, also   Amoxicillin Hives, Nausea Only and Rash   Amoxicillin-Pot Clavulanate Rash   Empagliflozin Nausea Only, Anxiety and Other (See Comments)    Made the patient jittery and nauseous   Januvia [Sitagliptin] Anxiety    Consultations: Heart failure team.    Procedures/Studies: CT ABDOMEN PELVIS WO CONTRAST  Result Date: 06/14/2020 CLINICAL DATA:  Abdominal pain and distension. Recent hospitalization for atrial fibrillation. Fell last night and today. Bilateral leg weakness. EXAM: CT CHEST, ABDOMEN AND PELVIS WITHOUT CONTRAST TECHNIQUE: Multidetector CT imaging of the chest, abdomen and pelvis was performed following the standard protocol without IV contrast. COMPARISON:  Pelvis CT dated 10/15/2014. Chest radiographs obtained earlier today. FINDINGS: CT CHEST FINDINGS Cardiovascular: Enlarged heart. Atheromatous calcifications, including the coronary arteries and aorta. Mediastinum/Nodes: No enlarged mediastinal, hilar, or axillary lymph nodes. Thyroid gland, trachea, and esophagus demonstrate no significant findings. Lungs/Pleura: Small left upper lobe calcified granuloma. Normal vascularity. No airspace consolidation or pleural fluid. Musculoskeletal: Thoracic and lower cervical spine degenerative changes. No fractures. CT ABDOMEN PELVIS FINDINGS Hepatobiliary: Mildly heterogeneous, medium density the in the gallbladder. No gallbladder wall thickening or pericholecystic fluid seen. Unremarkable liver. Pancreas: Moderate pancreatic atrophy. Spleen:  Normal in size and shape. Small probable cyst or hemangioma. Small calcified granuloma. Adrenals/Urinary Tract: Bilateral renal cysts. The largest is in the lower pole of the right kidney and measures 7 cm in maximum diameter with a small amount of thin wall calcification. Adjacent calculi in the upper pole of the right kidney, the largest measuring 1.4 cm. 1.0 cm left renal pelvis calculus. No hydronephrosis. Normal appearing ureters. Poorly distended urinary bladder. Stomach/Bowel: Unremarkable stomach, small bowel and colon. No evidence of appendicitis. Vascular/Lymphatic: Mildly prominent porta hepatis and portacaval lymph nodes with no pathologically enlarged nodes. Atheromatous arterial calcifications. Reproductive: Markedly enlarged  prostate gland containing several small surgical clips or radioactive seeds. Other: Small amount of free peritoneal fluid. Mild subcutaneous edema. Musculoskeletal: Lumbar spine degenerative changes. IMPRESSION: 1. No acute abnormality. 2. Bilateral nonobstructing renal calculi. 3. Probable borderline reactive adenopathy in the right upper abdomen. 4.  Calcific coronary artery and aortic atherosclerosis. 5. Cardiomegaly. 6. Markedly enlarged prostate gland. Electronically Signed   By: Claudie Revering M.D.   On: 06/14/2020 17:50   DG Chest 2 View  Result Date: 06/14/2020 CLINICAL DATA:  Shortness of breath and weakness.  Falling. EXAM: CHEST - 2 VIEW COMPARISON:  05/27/2020 FINDINGS: Lungs are adequately inflated with resolution of previously seen small left effusion. No acute airspace process. Mild stable cardiomegaly. Remainder of the exam is unchanged. IMPRESSION: 1. No acute cardiopulmonary disease. 2. Mild stable cardiomegaly. Electronically Signed   By: Marin Olp M.D.   On: 06/14/2020 14:01   CT Head Wo Contrast  Result Date: 06/14/2020 CLINICAL DATA:  Fall. EXAM: CT HEAD WITHOUT CONTRAST TECHNIQUE: Contiguous axial images were obtained from the base of the skull  through the vertex without intravenous contrast. COMPARISON:  None. FINDINGS: Brain: No evidence of acute infarction, hemorrhage, hydrocephalus, extra-axial collection or mass lesion/mass effect. Mild generalized cerebral atrophy, within normal limits for age. Scattered mild periventricular and subcortical white matter hypodensities are nonspecific, but favored to reflect chronic microvascular ischemic changes. Vascular: Calcified atherosclerosis at the skullbase. No hyperdense vessel. Skull: Normal. Negative for fracture or focal lesion. Sinuses/Orbits: No acute finding. Other: None. IMPRESSION: 1. No acute intracranial abnormality. 2. Mild chronic microvascular ischemic changes. Electronically Signed   By: Titus Dubin M.D.   On: 06/14/2020 17:35   CT Chest Wo Contrast  Result Date: 06/14/2020 CLINICAL DATA:  Abdominal pain and distension. Recent hospitalization for atrial fibrillation. Fell last night and today. Bilateral leg weakness. EXAM: CT CHEST, ABDOMEN AND PELVIS WITHOUT CONTRAST TECHNIQUE: Multidetector CT imaging of the chest, abdomen and pelvis was performed following the standard protocol without IV contrast. COMPARISON:  Pelvis CT dated 10/15/2014. Chest radiographs obtained earlier today. FINDINGS: CT CHEST FINDINGS Cardiovascular: Enlarged heart. Atheromatous calcifications, including the coronary arteries and aorta. Mediastinum/Nodes: No enlarged mediastinal, hilar, or axillary lymph nodes. Thyroid gland, trachea, and esophagus demonstrate no significant findings. Lungs/Pleura: Small left upper lobe calcified granuloma. Normal vascularity. No airspace consolidation or pleural fluid. Musculoskeletal: Thoracic and lower cervical spine degenerative changes. No fractures. CT ABDOMEN PELVIS FINDINGS Hepatobiliary: Mildly heterogeneous, medium density the in the gallbladder. No gallbladder wall thickening or pericholecystic fluid seen. Unremarkable liver. Pancreas: Moderate pancreatic atrophy.  Spleen: Normal in size and shape. Small probable cyst or hemangioma. Small calcified granuloma. Adrenals/Urinary Tract: Bilateral renal cysts. The largest is in the lower pole of the right kidney and measures 7 cm in maximum diameter with a small amount of thin wall calcification. Adjacent calculi in the upper pole of the right kidney, the largest measuring 1.4 cm. 1.0 cm left renal pelvis calculus. No hydronephrosis. Normal appearing ureters. Poorly distended urinary bladder. Stomach/Bowel: Unremarkable stomach, small bowel and colon. No evidence of appendicitis. Vascular/Lymphatic: Mildly prominent porta hepatis and portacaval lymph nodes with no pathologically enlarged nodes. Atheromatous arterial calcifications. Reproductive: Markedly enlarged prostate gland containing several small surgical clips or radioactive seeds. Other: Small amount of free peritoneal fluid. Mild subcutaneous edema. Musculoskeletal: Lumbar spine degenerative changes. IMPRESSION: 1. No acute abnormality. 2. Bilateral nonobstructing renal calculi. 3. Probable borderline reactive adenopathy in the right upper abdomen. 4.  Calcific coronary artery and aortic atherosclerosis. 5. Cardiomegaly. 6.  Markedly enlarged prostate gland. Electronically Signed   By: Claudie Revering M.D.   On: 06/14/2020 17:50   DG CHEST PORT 1 VIEW  Result Date: 06/19/2020 CLINICAL DATA:  Recent PICC line placement EXAM: PORTABLE CHEST 1 VIEW COMPARISON:  06/18/2020 FINDINGS: Cardiac shadow is enlarged. Left-sided PICC line is noted with the catheter tip in the distal superior vena cava. Right jugular line has been removed in the interval. Lungs are clear. No bony abnormality is noted. IMPRESSION: New left-sided PICC line as described. Electronically Signed   By: Inez Catalina M.D.   On: 06/19/2020 16:03   DG CHEST PORT 1 VIEW  Result Date: 06/18/2020 CLINICAL DATA:  Fever R/o any infectious process EXAM: PORTABLE CHEST - 1 VIEW COMPARISON:  06/15/2020 FINDINGS:  Relatively low lung volumes. No focal infiltrate or overt edema. Stable right IJ central line to the cavoatrial junction. Stable cardiomegaly.  Aortic Atherosclerosis (ICD10-170.0). There is blunting of the left lateral costophrenic angle as before. No pneumothorax. Right acromioclavicular spurring. Regional bones otherwise unremarkable. IMPRESSION: Stable cardiomegaly.  No acute disease. Electronically Signed   By: Lucrezia Europe M.D.   On: 06/18/2020 10:40   DG CHEST PORT 1 VIEW  Result Date: 06/15/2020 CLINICAL DATA:  Central line EXAM: PORTABLE CHEST 1 VIEW COMPARISON:  June 14, 2020 FINDINGS: The cardiomediastinal silhouette is unchanged and enlarged in contour.RIGHT IJ CVC tip terminates over the region of the inferior SVC. Atherosclerotic calcifications of the aorta. No pleural effusion. No pneumothorax. Unchanged LEFT retrocardiac opacity, likely atelectasis no acute pleuroparenchymal abnormality. Visualized abdomen is unremarkable. Multilevel degenerative changes of the thoracic spine. IMPRESSION: RIGHT IJ CVC tip terminates over the region of the inferior SVC. Electronically Signed   By: Valentino Saxon MD   On: 06/15/2020 18:39   ECHO TEE  Result Date: 06/29/2020    TRANSESOPHOGEAL ECHO REPORT   Patient Name:   Wesley Winters Date of Exam: 06/20/2020 Medical Rec #:  [id number]    Height:       68.0 in Accession #:    [id number]   Weight:       203.0 lb Date of Birth:  1940-12-25    BSA:          2.057 m Patient Age:    89 years     BP:           104/67 mmHg Patient Gender: M            HR:           88 bpm. Exam Location:  Inpatient Procedure: 3D Echo, Transesophageal Echo, Cardiac Doppler and Color Doppler                               MODIFIED REPORT: This report was modified by Glori Bickers MD on 06/29/2020 due to updated.  Indications:     I48.91* Unspeicified atrial fibrillation  History:         Patient has prior history of Echocardiogram examinations, most                  recent 06/15/2020.  CHF, CAD, Abnormal ECG, Arrythmias:Atrial                  Fibrillation; Risk Factors:Sleep Apnea, Dyslipidemia and                  Diabetes. Cardiogenic shock. Cancer.  Sonographer:     [place]  Referring Phys:  [id number] Sha[address] Pascal BENSIMHON Diagnosing Phys: Glori Bickers MD PROCEDURE: After discussion of the risks and benefits of a TEE, an informed consent was obtained from the patient. The transesophogeal probe was passed without difficulty through the esophogus of the patient. Imaged were obtained with the patient in a left lateral decubitus position. Sedation performed by different physician. The patient was monitored while under deep sedation. Anesthestetic sedation was provided intravenously by Anesthesiology: 190mg  of Propofol. The patient developed no complications during the procedure. A successful direct current cardioversion was performed at 200 joules with 1 attempt. IMPRESSIONS  1. Left ventricular ejection fraction, by estimation, is 20 to 25%. The left ventricle has severely decreased function. The left ventricle demonstrates global hypokinesis.  2. Right ventricular systolic function is moderately reduced. The right ventricular size is normal.  3. LA diameter 5.0cm. Left atrial size was severely dilated. No left atrial/left atrial appendage thrombus was detected.  4. Right atrial size was moderately dilated.  5. The mitral valve is abnormal. Severe mitral valve regurgitation.  6. Tricuspid valve regurgitation is moderate.  7. The aortic valve is tricuspid. There is mild calcification of the aortic valve. Aortic valve regurgitation is not visualized.  8. There is Moderate (Grade III) plaque. FINDINGS  Left Ventricle: Left ventricular ejection fraction, by estimation, is 20 to 25%. The left ventricle has severely decreased function. The left ventricle demonstrates global hypokinesis. The left ventricular internal cavity size was normal in size. Right Ventricle: The right ventricular size is  normal. No increase in right ventricular wall thickness. Right ventricular systolic function is moderately reduced. Left Atrium: LA diameter 5.0cm. Left atrial size was severely dilated. No left atrial/left atrial appendage thrombus was detected. Right Atrium: Right atrial size was moderately dilated. Pericardium: There is no evidence of pericardial effusion. Mitral Valve: The mitral valve is abnormal. Severe mitral valve regurgitation, with posteriorly-directed jet. Tricuspid Valve: The tricuspid valve is normal in structure. Tricuspid valve regurgitation is moderate. Aortic Valve: The aortic valve is tricuspid. There is mild calcification of the aortic valve. Aortic valve regurgitation is not visualized. Pulmonic Valve: The pulmonic valve was normal in structure. Pulmonic valve regurgitation is trivial. Aorta: The aortic root and ascending aorta are structurally normal, with no evidence of dilitation. There is moderate (Grade III) plaque. IAS/Shunts: No atrial level shunt detected by color flow Doppler. There is no evidence of a patent foramen ovale. There is no evidence of an atrial septal defect. Glori Bickers MD Electronically signed by Glori Bickers MD Signature Date/Time: 06/29/2020/4:08:42 PM    Final (Updated)    ECHOCARDIOGRAM LIMITED  Result Date: 06/15/2020    ECHOCARDIOGRAM LIMITED REPORT   Patient Name:   Wesley Winters Date of Exam: 06/15/2020 Medical Rec #:  [id number]    Height:       68.0 in Accession #:    [id number]   Weight:       209.0 lb Date of Birth:  [birth year]    BSA:          2.082 m Patient Age:    40 years     BP:           119/71 mmHg Patient Gender: M            HR:           108 bpm. Exam Location:  Inpatient Procedure: Limited Echo, Cardiac Doppler, Color Doppler and Intracardiac            Opacification Agent Indications:  Shock  History:        Patient has prior history of Echocardiogram examinations, most                 recent 05/05/2020. CAD, Arrythmias:Atrial  Fibrillation; Risk                 Factors:Hypertension, Dyslipidemia, Sleep Apnea and Diabetes.                 S/P PCI.  Sonographer:    Clayton Lefort RDCS (AE) Referring Phys: [id number] [place]  1. Left ventricular ejection fraction, by estimation, is <20%. The left ventricle has severely decreased function. The left ventricle demonstrates global hypokinesis. The left ventricular internal cavity size was severely dilated. Left ventricular diastolic function could not be evaluated.  2. Right ventricular systolic function is severely reduced. The right ventricular size is moderately enlarged. There is moderately elevated pulmonary artery systolic pressure. The estimated right ventricular systolic pressure is 50.5 mmHg.  3. Right atrial size was severely dilated.  4. The mitral valve is degenerative. Moderate mitral valve regurgitation. No evidence of mitral stenosis.  5. The aortic valve is tricuspid. Aortic valve regurgitation is not visualized. Mild aortic valve sclerosis is present, with no evidence of aortic valve stenosis.  6. The inferior vena cava is dilated in size with <50% respiratory variability, suggesting right atrial pressure of 15 mmHg. FINDINGS  Left Ventricle: Left ventricular ejection fraction, by estimation, is <20%. The left ventricle has severely decreased function. The left ventricle demonstrates global hypokinesis. Definity contrast agent was given IV to delineate the left ventricular endocardial borders. The left ventricular internal cavity size was severely dilated. There is no left ventricular hypertrophy. Left ventricular diastolic function could not be evaluated. Left ventricular diastolic function could not be evaluated due to atrial fibrillation. Right Ventricle: The right ventricular size is moderately enlarged. No increase in right ventricular wall thickness. Right ventricular systolic function is severely reduced. There is moderately elevated pulmonary artery  systolic pressure. The tricuspid regurgitant velocity is 3.31 m/s, and with an assumed right atrial pressure of 15 mmHg, the estimated right ventricular systolic pressure is 69.7 mmHg. Left Atrium: Left atrial size was normal in size. Right Atrium: Right atrial size was severely dilated. Pericardium: There is no evidence of pericardial effusion. Mitral Valve: The mitral valve is degenerative in appearance. There is mild thickening of the mitral valve leaflet(s). There is mild calcification of the mitral valve leaflet(s). Moderate mitral valve regurgitation. No evidence of mitral valve stenosis. Tricuspid Valve: The tricuspid valve is normal in structure. Tricuspid valve regurgitation is mild . No evidence of tricuspid stenosis. Aortic Valve: The aortic valve is tricuspid. Aortic valve regurgitation is not visualized. Mild aortic valve sclerosis is present, with no evidence of aortic valve stenosis. Pulmonic Valve: The pulmonic valve was normal in structure. Pulmonic valve regurgitation is not visualized. No evidence of pulmonic stenosis. Aorta: The aortic root is normal in size and structure. Venous: The inferior vena cava is dilated in size with less than 50% respiratory variability, suggesting right atrial pressure of 15 mmHg. IAS/Shunts: No atrial level shunt detected by color flow Doppler. LEFT VENTRICLE PLAX 2D LVIDd:         6.40 cm LVIDs:         5.80 cm LV PW:         0.90 cm LV IVS:        0.80 cm LVOT diam:     2.10 cm LVOT  Area:     3.46 cm  IVC IVC diam: 2.70 cm LEFT ATRIUM         Index LA diam:    3.50 cm 1.68 cm/m   AORTA Ao Root diam: 3.40 cm Ao Asc diam:  3.40 cm MR Peak grad: 66.9 mmHg   TRICUSPID VALVE MR Mean grad: 45.0 mmHg   TR Peak grad:   43.8 mmHg MR Vmax:      409.00 cm/s TR Vmax:        331.00 cm/s MR Vmean:     317.0 cm/s                           SHUNTS                           Systemic Diam: 2.10 cm Fransico Him MD Electronically signed by Fransico Him MD Signature Date/Time:  06/15/2020/11:02:38 AM    Final    [place] EKG SITE RITE  Result Date: 06/19/2020 If Site Rite image not attached, placement could not be confirmed due to current cardiac rhythm.     Subjective:   Discharge Exam: Vitals:   07/08/20 0847 07/08/20 1105  BP:  (!) 122/58  Pulse: 60 61  Resp:  16  Temp:  98 F (36.7 C)  SpO2:  99%   Vitals:   07/08/20 0416 07/08/20 0500 07/08/20 0847 07/08/20 1105  BP: 119/63   (!) 122/58  Pulse: (!) 58  60 61  Resp: 17   16  Temp: 98 F (36.7 C)   98 F (36.7 C)  TempSrc: Oral   Oral  SpO2: 98%   99%  Weight:  99.2 kg    Height:        General: Pt is alert, awake, not in acute distress Cardiovascular: RRR, S1/S2 +, no rubs, no gallops Respiratory: CTA bilaterally, no wheezing, no rhonchi Abdominal: Soft, NT, ND, bowel sounds + Extremities: no edema, no cyanosis    The results of significant diagnostics from this hospitalization (including imaging, microbiology, ancillary and laboratory) are listed below for reference.     Microbiology: No results found for this or any previous visit (from the past 240 hour(s)).   Labs: BNP (last 3 results) Recent Labs    05/26/20 1429 06/15/20 0729  BNP 1,609.8* 2,979.8*   Basic Metabolic Panel: Recent Labs  Lab 07/03/20 0459 07/04/20 0500 07/05/20 0530 07/06/20 0340 07/07/20 0540 07/08/20 0605  NA 128* 129* 128* 130* 130*  --   K 3.8 3.6 4.4 4.2 4.3  --   CL 90* 90* 90* 93* 93*  --   CO2 29 31 30 30 30   --   GLUCOSE 111* 89 89 99 79  --   BUN 20 18 19 20 23   --   CREATININE 1.56* 1.44* 1.52* 1.59* 1.64*  --   CALCIUM 7.6* 8.0* 8.1* 8.0* 8.1*  --   MG 1.7 1.9 1.9 1.8 1.9 2.0   Liver Function Tests: Recent Labs  Lab 07/05/20 1100  AST 20  ALT 22  ALKPHOS 98  BILITOT 1.0  PROT 5.2*  ALBUMIN 1.7*   No results for input(s): LIPASE, AMYLASE in the last 168 hours. No results for input(s): AMMONIA in the last 168 hours. CBC: Recent Labs  Lab 07/02/20 0417 07/04/20 0500  WBC  8.7 8.0  HGB 8.2* 8.2*  HCT 26.9* 27.6*  MCV 77.1* 77.5*  PLT 233  257   Cardiac Enzymes: No results for input(s): CKTOTAL, CKMB, CKMBINDEX, TROPONINI in the last 168 hours. BNP: Invalid input(s): POCBNP CBG: Recent Labs  Lab 07/07/20 1140 07/07/20 1524 07/07/20 2156 07/08/20 0639 07/08/20 1103  GLUCAP 140* 129* 164* 97 200*   D-Dimer No results for input(s): DDIMER in the last 72 hours. Hgb A1c No results for input(s): HGBA1C in the last 72 hours. Lipid Profile No results for input(s): CHOL, HDL, LDLCALC, TRIG, CHOLHDL, LDLDIRECT in the last 72 hours. Thyroid function studies No results for input(s): TSH, T4TOTAL, T3FREE, THYROIDAB in the last 72 hours.  Invalid input(s): FREET3 Anemia work up No results for input(s): VITAMINB12, FOLATE, FERRITIN, TIBC, IRON, RETICCTPCT in the last 72 hours. Urinalysis    Component Value Date/Time   COLORURINE YELLOW 06/16/2020 1822   APPEARANCEUR CLEAR 06/16/2020 1822   LABSPEC 1.015 06/16/2020 1822   PHURINE 7.0 06/16/2020 1822   GLUCOSEU NEGATIVE 06/16/2020 1822   HGBUR LARGE (A) 06/16/2020 1822   BILIRUBINUR NEGATIVE 06/16/2020 1822   KETONESUR NEGATIVE 06/16/2020 1822   PROTEINUR >300 (A) 06/16/2020 1822   NITRITE POSITIVE (A) 06/16/2020 1822   LEUKOCYTESUR MODERATE (A) 06/16/2020 1822   Sepsis Labs Invalid input(s): PROCALCITONIN,  WBC,  LACTICIDVEN Microbiology No results found for this or any previous visit (from the past 240 hour(s)).   Time coordinating discharge:36 minutes.   SIGNED:   Hosie Poisson, MD  [facility] 07/08/2020, 3:06 PM

## 2020-07-08 NOTE — Consult Note (Signed)
   [facility] Inpatient Consult   07/08/2020  [name] 07-11-1940 [id number]  [place] Organization [ACO] Patient: Wesley Winters [facility]   Patient screened for less than 30 days readmission and length of stay  day 23 for post ICU [2H] hospitalization with noted high risk score for unplanned readmission risk . Reviewed for potential [place] Management service needs for post hospital transition.  Review of patient's medical record reveals patient is being recommended and planning for a skilled nursing facility level of care.  Plan:  Post hospital needs to be met at a skilled nursing level of care currently. No [facility] follow up needs at this time assessed.   For questions contact:   Natividad Brood, RN BSN [facility] Liaison  [phone] business mobile phone Toll free office [phone]  Fax number: [phone] [email or web address]

## 2020-07-08 NOTE — Plan of Care (Signed)
  Problem: Education: Goal: Knowledge of General Education information will improve Description: Including pain rating scale, medication(s)/side effects and non-pharmacologic comfort measures Outcome: Progressing   Problem: Nutrition: Goal: Adequate nutrition will be maintained Outcome: Progressing   Problem: Pain Managment: Goal: General experience of comfort will improve Outcome: Progressing   Problem: Safety: Goal: Ability to remain free from injury will improve Outcome: Progressing   Problem: Coping: Goal: Level of anxiety will decrease Outcome: Adequate for Discharge

## 2020-07-08 NOTE — Progress Notes (Signed)
Patient wearing CPAP when RT entered room. Patient resting comfortably. No respiratory distress noted. RT will monitor as needed.

## 2020-07-08 NOTE — TOC Progression Note (Addendum)
Transition of Care (TOC) - Progression Note  Heart Failure   Patient Details  Name: Wesley Winters MRN: [id number] Date of Birth: 02/21/1940  Transition of Care [facility]) CM/SW [place], [place] Phone Number: 07/08/2020, 10:29 AM  Clinical Narrative:    10:28am - CS[facility] to follow up regarding bed availability for [name] however no answer and CSW left a voicemail for them to return the call. Insurance authorization has been approved from 07/08/20 - 07/10/2020 awaiting a bed for rehab at [facility]. 11am - CSW received a call from [facility] who reported they do not have any private beds available at this time but will let the CSW know once something becomes available.   CSW called [facility] and spoke with Wesley Winters regarding bed availability and she reported not having anything right now but will reach out again this afternoon for an update. CSW faxed clinicals over again to [facility]   Awaiting a bed for rehab insurance has been approved.  1:45pm - CSW spoke with the patient at bedside and the patient's spouse Wesley Winters over the phone regarding [name] disposition plan upon discharge if [place] [facility] [place] do no have a bed available today or tomorrow both the patient and Wesley Winters reported that they do not want to go to any other facility than [facility] or [facility] and prefer to go home with hospice. CSW will not extend the SNF search beyond [facility] or [facility] and if they have nothing available then the CSW will arrange for home with hospice.  2:30pm - CSW received a message from [facility] stating that the nursing staff is declining the patient due to [place] concerns and not wanting the patient to get it with his other health issues. CSW informed the family of this information and they asked about Wesley Winters and CSW again reached out to [place] [facility] [place] [facility] [place] and is awaiting a response.  CSW spoke with the attending MD who reported they will put in a d/c summary for tomorrow pending bed availability at  [facility].  CSW will continue to follow throughout discharge.   Expected Discharge Plan: [place] Barriers to Discharge: Continued Medical Work up  Expected Discharge Plan and Services Expected Discharge Plan: [place] In-house Referral: Clinical Social Work Discharge Planning Services: CM Consult   Living arrangements for the past 2 months: Single Family Home                                       Social Determinants of Health (SDOH) Interventions Food Insecurity Interventions: Intervention Not Indicated Financial Strain Interventions: Other (Comment) (Discuss patient assistance program at [facility] outpatient appointment) Housing Interventions: Intervention Not Indicated Transportation Interventions: Intervention Not Indicated  Readmission Risk Interventions No flowsheet data found.  Geni Skorupski, MSW, [place] Heart Failure Social Worker

## 2020-07-08 NOTE — Plan of Care (Signed)

## 2020-07-09 LAB — GLUCOSE, CAPILLARY
Glucose-Capillary: 89 mg/dL (ref 70–99)
Glucose-Capillary: 177 mg/dL — ABNORMAL HIGH (ref 70–99)
Glucose-Capillary: 128 mg/dL — ABNORMAL HIGH (ref 70–99)
Glucose-Capillary: 107 mg/dL — ABNORMAL HIGH (ref 70–99)

## 2020-07-09 LAB — RESP PANEL BY RT-PCR (FLU A&B, COVID) ARPGX2
Influenza A by PCR: NEGATIVE
Influenza B by PCR: NEGATIVE
SARS Coronavirus 2 by RT PCR: NEGATIVE

## 2020-07-09 NOTE — TOC Transition Note (Addendum)
Transition of Care [facility]) - CM/SW Discharge Note Heart Failure   Patient Details  Name: Wesley Winters MRN: [id number] Date of Birth: Oct 31, 1940  Transition of Care [facility]) CM/SW Contact:  [place], [place] Phone Number: 07/09/2020, 10:42 AM   Clinical Narrative:    Patient will DC to: [facility] Anticipated DC date: 07/09/2020 Family notified: Yes, spouse Transport by: Corey Harold   Per MD patient ready for DC to [place], [place]. RN to call report prior to discharge [phone] RM# [id number]). RN, patient, patient's family, and facility notified of DC. Discharge Summary and FL2 sent to facility. DC packet on chart. Ambulance transport requested for patient, roughly 2 to 2.5 hours for transport.  CSW will sign off for now as social work intervention is no longer needed. Please consult [place] again if new needs arise.      Final next level of care: Skilled Nursing Facility Barriers to Discharge: Continued Medical Work up   Patient Goals and CMS Choice Patient states their goals for this hospitalization and ongoing recovery are:: to return home after rehab CMS Medicare.gov Compare Post Acute Care list provided to:: Patient Choice offered to / list presented to : Patient, Spouse  Discharge Placement PASRR number recieved: 07/09/20 Existing PASRR number confirmed : 07/09/20          Patient [facility] bed at: [facility] Patient to be transferred to facility by: [place] Name of family member notified: Vignesh Willert, Spouse [phone] Patient and family notified of of transfer: 07/09/20  Discharge Plan and Services In-house Referral: Clinical Social Work Discharge Planning Services: CM Consult                                 Social Determinants of Health (SDOH) Interventions Food Insecurity Interventions: Intervention Not Indicated Financial Strain Interventions: Other (Comment) (Discuss patient assistance program at [facility] outpatient appointment) Housing Interventions: Intervention Not  Indicated Transportation Interventions: Intervention Not Indicated   Readmission Risk Interventions No flowsheet data found.   Aveena Bari, MSW, [place] Heart Failure Social Worker

## 2020-07-09 NOTE — Progress Notes (Signed)
Physical Therapy Treatment Patient Details Name: Wesley Winters MRN: [id number] DOB: February 28, 1940 Today's Date: 07/09/2020    History of Present Illness Pt is 80 yo admitted 6/11 after 2 falls at home with fatigue and weakness.  Pt found to have afib with RVR, hypotensive, worsening kidney function and admitted for cardiogenic shock. Pt requiring Milrinone drip. PMHx; HF, cardiomyopathy with EF 25%, Afib, CAD, DM, HTN, recent admit 5/23-5/31    PT Comments    Progressing steadily toward goals, still limited by fatigue, weakness/instability at bil knees and general lack of confidence.  Emphasis on transitions, sit to stand safety, progression of gait and warm up strengthening exercises.    Follow Up Recommendations  SNF;Supervision for mobility/OOB     Equipment Recommendations  Rolling walker with 5" wheels;3in1 (PT)    Recommendations for Other Services       Precautions / Restrictions Precautions Precautions: Fall    Mobility  Bed Mobility Overal bed mobility: Needs Assistance Bed Mobility: Supine to Sit     Supine to sit: Min assist     General bed mobility comments: up via L elbow, pt grabbing for assist, truncal support while pt got both UE's ready to assist for scooting.    Transfers Overall transfer level: Needs assistance   Transfers: Sit to/from Stand Sit to Stand: Min assist;Mod assist         General transfer comment: cues for hand placement.  min assist forward and for boost, until fatigue or he lost his confidence and then mod stability assist needed.  Ambulation/Gait Ambulation/Gait assistance: Min assist;+2 safety/equipment Gait Distance (Feet): 90 Feet Assistive device: Rolling walker (2 wheeled) Gait Pattern/deviations: Step-through pattern;Decreased step length - right;Decreased step length - left;Decreased stride length   Gait velocity interpretation: <1.8 ft/sec, indicate of risk for recurrent falls General Gait Details: Initially, pt with weak  knee gait, but no overt buckling.  Definitely unsteady with R LE externally rotated with uncoordinated swing through.  After 1st sitting rest break, pt was unable to maintain stable knee extension and kept buckling.  Pt further lost confidence and wanted to sit and forego any further gait.   Stairs             Wheelchair Mobility    Modified Rankin (Stroke Patients Only)       Balance Overall balance assessment: Needs assistance Sitting-balance support: Feet supported;No upper extremity supported Sitting balance-Leahy Scale: Fair     Standing balance support: Bilateral upper extremity supported Standing balance-Leahy Scale: Poor Standing balance comment: reliant on AD and external support                            Cognition Arousal/Alertness: Awake/alert Behavior During Therapy: WFL for tasks assessed/performed Overall Cognitive Status: Within Functional Limits for tasks assessed                                        Exercises Other Exercises Other Exercises: warm up hip/knee flexion/ext ROM exercise with graded resistance both flex/ext x10 reps bil Other Exercises: warm up bicep/tricep presses with graded resistance x 10 reps bil.    General Comments General comments (skin integrity, edema, etc.): VSS on RA      Pertinent Vitals/Pain Pain Assessment: Faces Faces Pain Scale: No hurt Pain Intervention(s): Monitored during session    Home Living  Prior Function            PT Goals (current goals can now be found in the care plan section) Acute Rehab PT Goals Patient Stated Goal: I need to get a little stronger PT Goal Formulation: With patient/family Time For Goal Achievement: 07/15/20 Potential to Achieve Goals: Good Progress towards PT goals: Progressing toward goals    Frequency    Min 3X/week      PT Plan Current plan remains appropriate    Co-evaluation              AM-PAC  PT "6 Clicks" Mobility   Outcome Measure  Help needed turning from your back to your side while in a flat bed without using bedrails?: A Little Help needed moving from lying on your back to sitting on the side of a flat bed without using bedrails?: A Little Help needed moving to and from a bed to a chair (including a wheelchair)?: A Little Help needed standing up from a chair using your arms (e.g., wheelchair or bedside chair)?: A Little Help needed to walk in hospital room?: A Little Help needed climbing 3-5 steps with a railing? : A Lot 6 Click Score: 17    End of Session Equipment Utilized During Treatment: Gait belt Activity Tolerance: Patient tolerated treatment well Patient left: in chair;with call bell/phone within reach;with family/visitor present Nurse Communication: Mobility status PT Visit Diagnosis: Other abnormalities of gait and mobility (R26.89);Difficulty in walking, not elsewhere classified (R26.2)     Time: 1059-1130 PT Time Calculation (min) (ACUTE ONLY): 31 min  Charges:  $Gait Training: 8-22 mins $Therapeutic Activity: 8-22 mins                     07/09/2020  Ginger Carne., PT Acute Rehabilitation Services [phone]  (pager) [phone]  (office)   Tessie Fass [place] 07/09/2020, 2:26 PM

## 2020-07-09 NOTE — TOC Progression Note (Addendum)
Transition of Care (TOC) - Progression Note  Heart Failure   Patient Details  Name: Wesley Winters MRN: [id number] Date of Birth: [birth year]  Transition of Care [facility]) CM/SW [place], [place] Phone Number: 07/09/2020, 9:15 AM  Clinical Narrative:    CSW spoke with [facility] this morning and they reported they do have a bed available for [name] today. CSW notified the attending MD and asked for a rapid COVID test today before patient is discharged to [facility] and made MD aware to please sign DNR on the patients chart before the patient's d/c.  CSW spoke with the patient and his spouse at bedside to inform them that [facility] has a bed available today and [name] will be discharged. CSW reviewed the discharge process with the patient and spouse and answered questions. CSW asked Mrs. Montoya to bring the patients CPAP to [facility] for the patient and she reported understanding.  CSW will continue to follow throughout discharge.   Expected Discharge Plan: [place] Barriers to Discharge: Continued Medical Work up  Expected Discharge Plan and Services Expected Discharge Plan: [place] In-house Referral: Clinical Social Work Discharge Planning Services: CM Consult   Living arrangements for the past 2 months: Single Family Home                                       Social Determinants of Health (SDOH) Interventions Food Insecurity Interventions: Intervention Not Indicated Financial Strain Interventions: Other (Comment) (Discuss patient assistance program at [facility] outpatient appointment) Housing Interventions: Intervention Not Indicated Transportation Interventions: Intervention Not Indicated  Readmission Risk Interventions No flowsheet data found.  Osker Ayoub, MSW, [place] Heart Failure Social Worker

## 2020-07-10 DIAGNOSIS — N179 Acute kidney failure, unspecified: Secondary | ICD-10-CM | POA: Diagnosis not present

## 2020-07-10 DIAGNOSIS — R570 Cardiogenic shock: Secondary | ICD-10-CM | POA: Diagnosis not present

## 2020-07-10 DIAGNOSIS — I5023 Acute on chronic systolic (congestive) heart failure: Secondary | ICD-10-CM | POA: Diagnosis not present

## 2020-07-10 DIAGNOSIS — I480 Paroxysmal atrial fibrillation: Secondary | ICD-10-CM | POA: Diagnosis not present

## 2020-07-11 DIAGNOSIS — I4891 Unspecified atrial fibrillation: Secondary | ICD-10-CM | POA: Diagnosis not present

## 2020-07-11 DIAGNOSIS — R2681 Unsteadiness on feet: Secondary | ICD-10-CM | POA: Diagnosis not present

## 2020-07-11 DIAGNOSIS — R570 Cardiogenic shock: Secondary | ICD-10-CM | POA: Diagnosis not present

## 2020-07-11 DIAGNOSIS — M6281 Muscle weakness (generalized): Secondary | ICD-10-CM | POA: Diagnosis not present

## 2020-07-15 DIAGNOSIS — M6281 Muscle weakness (generalized): Secondary | ICD-10-CM | POA: Diagnosis not present

## 2020-07-15 DIAGNOSIS — I4891 Unspecified atrial fibrillation: Secondary | ICD-10-CM | POA: Diagnosis not present

## 2020-07-15 DIAGNOSIS — R570 Cardiogenic shock: Secondary | ICD-10-CM | POA: Diagnosis not present

## 2020-07-15 DIAGNOSIS — R2681 Unsteadiness on feet: Secondary | ICD-10-CM | POA: Diagnosis not present

## 2020-07-16 ENCOUNTER — Ambulatory Visit: Payer: Medicare Other | Admitting: Cardiology

## 2020-07-17 DIAGNOSIS — R570 Cardiogenic shock: Secondary | ICD-10-CM | POA: Diagnosis not present

## 2020-07-17 DIAGNOSIS — M6281 Muscle weakness (generalized): Secondary | ICD-10-CM | POA: Diagnosis not present

## 2020-07-17 DIAGNOSIS — I4891 Unspecified atrial fibrillation: Secondary | ICD-10-CM | POA: Diagnosis not present

## 2020-07-17 DIAGNOSIS — R2681 Unsteadiness on feet: Secondary | ICD-10-CM | POA: Diagnosis not present

## 2020-07-17 NOTE — Progress Notes (Addendum)
Advanced Heart Failure Clinic Note   PCP: [name], MD PCP-Cardiologist: Sinclair Grooms, MD  HF Cardiologist: Dr. Haroldine Laws  HPI: Wesley Winters is a 80 y.o. male with a hx of DM2, hypertension, hyperlipidemia, OSA, CAD (stent to LAD and RCA in 1998 and 1999; patent coronary arteries 8675), acute systolic HF 44%, and AF unknown duration first diagnosed 04/23/2020.    Found to have new onset AF of unknown duration at office visit 04/23/20 with Dr. Tamala Julian. Started on amio and anticoagulation with plans for eventual DC-CV.   Echo 05/05/20 LVEF 20-25%  and moderately reduced RV function (previous EF 50%)   Admitted 05/26/20 with acute HF. Diuresed. Underwent successful DC-CV on 5/25. Discharged home 06/03/20  Seen by PCP 6/7. Patient with low BP and back in AF in 100-110 range. Lasix cut back and amio increased to 200 bid after discussion with Cardiology.  See in EP Clinic by Dr. Curt Bears on 06/13/20. BP 110/74 at that visit. HR 113 in AF. Felt not to be in HF.   Presented to the ED 06/14/20 after falling at home. Found to be in AF with RVR. Creatinine 1.5 -> 3.2 with K 6.6, given IVF. SBP dropped to 80s. Lactate 8.5  CCM contacted,  started on NE and IV lasix gtt with excellent response. Hospitalization c/b cardiogenic shock (requiring NE & milrinone), atrial fibrillation with RVR (s/p DCCV 06/20/20), AKI, shock liver and sepsis. Palliative was consulted for goals of care and per wife and daughter's wishes, planned for comfort care if patient failed milrinone wean. He weaned off milrinone, lasix eventually switched to torsemide, his beta blocker was stopped, and he was started on midodrine. He was eventually discharged to SNF. Discharge weight 218 lbs.  Today he returns for post hospitalization HF follow up with his wife and daughter. He is currently at [facility]. Had a fall last night, slid off air mattress at facility. Family says he is struggling with confusion, jerking movements, anxiety,  and agitation. Patient says he is has jerking movements & incontinence. Poor appetite. Denies increasing SOB, CP, dizziness, edema, or PND/Orthopnea. No fever or chills. Weight 212 lbs on 07/12/20. Taking all medications. Unable to stand today for a weight. Has decubitus on sacrum.  Review of Systems: [y] = yes, [ ]  = no   General: Weight gain [ ] ; Weight loss [ ] ; Anorexia Blue.Reese ]; Fatigue Blue.Reese ]; Fever [ ] ; Chills [ ] ; Weakness Blue.Reese ]  Cardiac: Chest pain/pressure [ ] ; Resting SOB [ ] ; Exertional SOB [ ] ; Orthopnea [ ] ; Pedal Edema Blue.Reese ]; Palpitations [ ] ; Syncope [ ] ; Presyncope [ ] ; Paroxysmal nocturnal dyspnea[ ]   Pulmonary: Cough [ ] ; Wheezing[ ] ; Hemoptysis[ ] ; Sputum [ ] ; Snoring [ ]   GI: Vomiting[ ] ; Dysphagia[ ] ; Melena[ ] ; Hematochezia [ ] ; Heartburn[ ] ; Abdominal pain [ ] ; Constipation [ ] ; Diarrhea [ ] ; BRBPR [ ]   GU: Hematuria[ ] ; Dysuria [ ] ; Nocturia[ ]   Vascular: Pain in legs with walking [ ] ; Pain in feet with lying flat [ ] ; Non-healing sores Blue.Reese ]; Stroke [ ] ; TIA [ ] ; Slurred speech [ ] ;  Neuro: Headaches[ ] ; Vertigo[ ] ; Seizures[ ] ; Paresthesias[ ] ;Blurred vision [ ] ; Diplopia [ ] ; Vision changes [ ]   Ortho/Skin: Arthritis [ ] ; Joint pain [ ] ; Muscle pain [ ] ; Joint swelling [ ] ; Back Pain [ ] ; Rash [ ]   Psych: Depression[ ] ; Anxiety[y  ]  Heme: Bleeding problems [ ] ; Clotting disorders [ ] ; Anemia [ ]   Endocrine: Diabetes Blue.Reese ]; Thyroid dysfunction[ ]   Cardiac Studies: - Echo (05/05/20): LVEF 20-25%  and moderately reduced RV function (previous EF 50%)  - Echo (6/22): EF 20-25%, severe LV dysfunction, RV moderately reduced, severe MR, moderate TR  - TEE/DCCV (6/22): successful return to SR for 2 days then back in AF.  Past Medical History:  Diagnosis Date   Balanitis    Bladder calculus    Coronary artery disease    stents x3 by Dr Tamala Julian 1998   Depression    Diabetes mellitus without complication ([place])    GERD (gastroesophageal reflux disease)    Hypercholesterolemia     Hypertension    Hypomagnesemia 06/03/2020   Phimosis    Prostate cancer (HCC)    Sleep apnea    wears CPAP nightly   Current Outpatient Medications  Medication Sig Dispense Refill   acetaminophen (TYLENOL) 500 MG tablet Take 500-1,000 mg by mouth 3 (three) times daily as needed for moderate pain, mild pain or headache.     amiodarone (PACERONE) 200 MG tablet Take 1 tablet (200 mg total) by mouth daily. 30 tablet 2   apixaban (ELIQUIS) 2.5 MG TABS tablet Take 1 tablet (2.5 mg total) by mouth 2 (two) times daily. 60 tablet 2   atorvastatin (LIPITOR) 40 MG tablet Take 40 mg by mouth daily.     B-D UF III MINI PEN NEEDLES 31G X 5 MM MISC 1 each by Other route every evening.     cetirizine (ZYRTEC) 10 MG tablet Take 10 mg by mouth in the morning.     cholecalciferol (VITAMIN D3) 25 MCG (1000 UT) tablet Take 1,000 Units by mouth at bedtime.     digoxin 62.5 MCG TABS Take 0.0625 mg by mouth daily. 30 tablet 0   docusate sodium (COLACE) 100 MG capsule Take 1 capsule (100 mg total) by mouth daily. 10 capsule 0   fluticasone (FLONASE) 50 MCG/ACT nasal spray Place 2 sprays into both nostrils daily as needed for allergies.      midodrine (PROAMATINE) 2.5 MG tablet Take 1 tablet (2.5 mg total) by mouth 3 (three) times daily with meals. 90 tablet 2   mupirocin ointment (BACTROBAN) 2 % APPLY TO RIGHT GREAT TOE AND RIGHT 3RD TOE ONCE DAILY 22 g 0   nitroGLYCERIN (NITROSTAT) 0.4 MG SL tablet Place 1 tablet (0.4 mg total) under the tongue every 5 (five) minutes as needed for chest pain. 30 tablet 12   ONETOUCH ULTRA test strip 1 each 3 (three) times daily.     pantoprazole (PROTONIX) 40 MG tablet Take 1 tablet (40 mg total) by mouth daily at 6 (six) AM. 15 tablet 0   PRESCRIPTION MEDICATION CPAP- At bedtime and during any naps     ranolazine (RANEXA) 500 MG 12 hr tablet Take 1 tablet (500 mg total) by mouth 2 (two) times daily. 60 tablet 1   senna (SENOKOT) 8.6 MG TABS tablet Take 1 tablet (8.6 mg total) by  mouth daily. 120 tablet 0   spironolactone (ALDACTONE) 25 MG tablet Take 1 tablet (25 mg total) by mouth daily. 30 tablet 2   tamsulosin (FLOMAX) 0.4 MG CAPS capsule Take 1 capsule (0.4 mg total) by mouth daily after supper. 30 capsule 2   torsemide (DEMADEX) 20 MG tablet Take 1 tablet (20 mg total) by mouth daily. 30 tablet 2   TOUJEO MAX SOLOSTAR 300 UNIT/ML Solostar Pen Inject 10 Units into the skin at bedtime. 1 mL 0   vitamin B-12 (CYANOCOBALAMIN)  1000 MCG tablet Take 1,000 mcg by mouth at bedtime.     No current facility-administered medications for this encounter.   Allergies  Allergen Reactions   Metformin Hcl Diarrhea   Other Other (See Comments) and Cough    Pollen- Chest congestion, also   Amoxicillin Hives, Nausea Only and Rash   Amoxicillin-Pot Clavulanate Rash   Empagliflozin Nausea Only, Anxiety and Other (See Comments)    Made the patient jittery and nauseous   Januvia [Sitagliptin] Anxiety   Social History   Socioeconomic History   Marital status: Married    Spouse name: Not on file   Number of children: Not on file   Years of education: Not on file   Highest education level: Not on file  Occupational History   Not on file  Tobacco Use   Smoking status: Never   Smokeless tobacco: Never  Substance and Sexual Activity   Alcohol use: No   Drug use: No   Sexual activity: Yes  Other Topics Concern   Not on file  Social History Narrative   Not on file   Social Determinants of Health   Financial Resource Strain: Low Risk    Difficulty of Paying Living Expenses: Not very hard  Food Insecurity: No Food Insecurity   Worried About Running Out of Food in the Last Year: Never true   Ran Out of Food in the Last Year: Never true  Transportation Needs: No Transportation Needs   Lack of Transportation (Medical): No   Lack of Transportation (Non-Medical): No  Physical Activity: Not on file  Stress: Not on file  Social Connections: Not on file  Intimate Partner  Violence: Not on file   Family History  Problem Relation Age of Onset   Cancer Brother        prostate   Heart disease Brother    Heart disease Mother    Arrhythmia Mother    Arrhythmia Brother    BP 124/76   Pulse 85   SpO2 97%   Wt Readings from Last 3 Encounters:  07/08/20 99.2 kg  06/13/20 93.6 kg  06/03/20 98.2 kg   PHYSICAL EXAM: General:  NAD. No resp difficulty, chronically-ill appearing, arrived in [facility] HEENT: Normal Neck: Supple. No JVD. Carotids 2+ bilat; no bruits. No lymphadenopathy or thryomegaly appreciated. Cor: PMI nondisplaced. Regular rate & rhythm. No rubs, gallops or murmurs. Lungs: Clear, diminished in bases Abdomen: Obese, nontender, nondistended. No hepatosplenomegaly. No bruits or masses. Good bowel sounds. Extremities: No cyanosis, clubbing, rash, trace bilateral pedal edema Neuro: Alert & oriented x 3, cranial nerves grossly intact. Moves all 4 extremities w/o difficulty. Flat affect  ECG: SR w/ LBBB, QRS 124 ms(personally reviewed)  Reds: 31%  ASSESSMENT & PLAN: Chronic Systolic Heart Failure  - Echo 3/20 EF 45-50% - Echo 5/22 EF 20-25% RV moderately down (in setting of newly diagnosed AF with RVR) - Given h/o CAD and multiple CRFs, will need ischemic eval once renal function improves. - NYHA IIIb, functional status limited by general deconditioning and body habitus. Volume status stable, Reds 31%.  - Continue torsemide 20 mg daily. - Continue digoxin 0.0625 mg daily. - Continue spironolactone 25 mg daily.  - Continue midodrine 2.5 mg tid. Will keep midodrine on board for now w/ recent mechanical falls. - BMET & dig level today.   2. CAD - S/p previous LAD stenting last cath 2005 with patent coronaries. - No LHC this admission due to renal function. - No chest pain. - Continue  ASA + statin.   3. AF with RVR - TEE/DCCV on 6/17-> NSR then went back to AF. - In NSR on ECG today.   - Decrease amio to 100 mg daily w/ increase in  shakiness/twitching.   - On Eliquis. No bleeding issues. - Not currently a candidate for ablation/CRT-P with wounds, per EP. - Check magnesium today.   4. DM2 - On Toujeo. - Last a1c 7.5 (6/22).  5. Deconditioning - Continue PT/OT at [facility].  6. GOC - Palliative consult during recent admission, made DNR. - If he is discharged home from facility, will likely need hospice for further symptom management and support resources. G[place]a Rm DNR form signed today.   Follow up in 2-3 months for further medication titration (plan to stop midodrine).  [place], FNP 07/18/20

## 2020-07-18 ENCOUNTER — Other Ambulatory Visit: Payer: Self-pay

## 2020-07-18 ENCOUNTER — Ambulatory Visit (HOSPITAL_COMMUNITY)
Admit: 2020-07-18 | Discharge: 2020-07-18 | Disposition: A | Discharge status: Home / Self Care | Payer: Medicare Other | Attending: Family Medicine | Admitting: Family Medicine

## 2020-07-18 ENCOUNTER — Telehealth (HOSPITAL_COMMUNITY): Payer: Self-pay | Admitting: Family Medicine

## 2020-07-18 ENCOUNTER — Encounter (HOSPITAL_COMMUNITY): Payer: Self-pay

## 2020-07-18 VITALS — BP 124/76 | HR 85

## 2020-07-18 DIAGNOSIS — Z8249 Family history of ischemic heart disease and other diseases of the circulatory system: Secondary | ICD-10-CM | POA: Insufficient documentation

## 2020-07-18 DIAGNOSIS — I2510 Atherosclerotic heart disease of native coronary artery without angina pectoris: Secondary | ICD-10-CM

## 2020-07-18 DIAGNOSIS — I5022 Chronic systolic (congestive) heart failure: Secondary | ICD-10-CM

## 2020-07-18 DIAGNOSIS — E119 Type 2 diabetes mellitus without complications: Secondary | ICD-10-CM | POA: Diagnosis not present

## 2020-07-18 DIAGNOSIS — Z79899 Other long term (current) drug therapy: Secondary | ICD-10-CM | POA: Insufficient documentation

## 2020-07-18 DIAGNOSIS — E7800 Pure hypercholesterolemia, unspecified: Secondary | ICD-10-CM | POA: Diagnosis not present

## 2020-07-18 DIAGNOSIS — Z66 Do not resuscitate: Secondary | ICD-10-CM | POA: Diagnosis not present

## 2020-07-18 DIAGNOSIS — R5381 Other malaise: Secondary | ICD-10-CM

## 2020-07-18 DIAGNOSIS — Z794 Long term (current) use of insulin: Secondary | ICD-10-CM | POA: Diagnosis not present

## 2020-07-18 DIAGNOSIS — I447 Left bundle-branch block, unspecified: Secondary | ICD-10-CM | POA: Diagnosis not present

## 2020-07-18 DIAGNOSIS — E785 Hyperlipidemia, unspecified: Secondary | ICD-10-CM | POA: Diagnosis not present

## 2020-07-18 DIAGNOSIS — Z955 Presence of coronary angioplasty implant and graft: Secondary | ICD-10-CM | POA: Insufficient documentation

## 2020-07-18 DIAGNOSIS — I110 Hypertensive heart disease with heart failure: Secondary | ICD-10-CM | POA: Diagnosis not present

## 2020-07-18 DIAGNOSIS — I4891 Unspecified atrial fibrillation: Secondary | ICD-10-CM | POA: Diagnosis not present

## 2020-07-18 DIAGNOSIS — Z7901 Long term (current) use of anticoagulants: Secondary | ICD-10-CM | POA: Insufficient documentation

## 2020-07-18 DIAGNOSIS — Z880 Allergy status to penicillin: Secondary | ICD-10-CM | POA: Insufficient documentation

## 2020-07-18 DIAGNOSIS — E1159 Type 2 diabetes mellitus with other circulatory complications: Secondary | ICD-10-CM

## 2020-07-18 DIAGNOSIS — Z7189 Other specified counseling: Secondary | ICD-10-CM

## 2020-07-18 LAB — BASIC METABOLIC PANEL
Anion gap: 18 — ABNORMAL HIGH (ref 5–15)
BUN: 35 mg/dL — ABNORMAL HIGH (ref 8–23)
CO2: 21 mmol/L — ABNORMAL LOW (ref 22–32)
Calcium: 7.9 mg/dL — ABNORMAL LOW (ref 8.9–10.3)
Chloride: 95 mmol/L — ABNORMAL LOW (ref 98–111)
Creatinine, Ser: 1.67 mg/dL — ABNORMAL HIGH (ref 0.61–1.24)
GFR, Estimated: 41 mL/min — ABNORMAL LOW (ref >60)
Glucose, Bld: 189 mg/dL — ABNORMAL HIGH (ref 70–99)
Potassium: 4 mmol/L (ref 3.5–5.1)
Sodium: 134 mmol/L — ABNORMAL LOW (ref 135–145)

## 2020-07-18 LAB — MAGNESIUM: Magnesium: 1.9 mg/dL (ref 1.7–2.4)

## 2020-07-18 LAB — DIGOXIN LEVEL: Digoxin Level: 1.2 ng/mL (ref 0.8–2.0)

## 2020-07-18 MED ORDER — AMIODARONE HCL 100 MG PO TABS: 100.0000 mg | ORAL_TABLET | Freq: Every day | ORAL | 2 refills | Status: AC

## 2020-07-18 NOTE — Telephone Encounter (Signed)
[facility] and spoke with Georgeanna Lea, LPN notifying him of Samuell's elevated digoxin level and to stop digoxin. Will fax order, along with order for repeat digoxin level in 10 days.   Allena Katz, FNP-BC

## 2020-07-18 NOTE — Patient Instructions (Signed)
The below orders were given to SNF:  DECREASE Amiodarone to 100mg , one tab daily  Labs today We will only contact you if something comes back abnormal or we need to make some changes. Otherwise no news is good news!  Your physician recommends that you schedule a follow-up appointment in: 3 months with Dr Haroldine Laws  Do the following things EVERYDAY: Weigh yourself in the morning before breakfast. Write it down and keep it in a log. Take your medicines as prescribed Eat low salt foods--Limit salt (sodium) to 2000 mg per day.  Stay as active as you can everyday Limit all fluids for the day to less than 2 liters  milAt the [facility] Clinic, you and your health needs are our priority. As part of our continuing mission to provide you with exceptional heart care, we have created designated Provider Care Teams. These Care Teams include your primary Cardiologist (physician) and Advanced Practice Providers (APPs- Physician Assistants and Nurse Practitioners) who all work together to provide you with the care you need, when you need it.   You may see any of the following providers on your designated Care Team at your next follow up: Dr Glori Bickers Dr Loralie Champagne Dr Patrice Paradise, NP Lyda Jester, [place] Ginnie Smart Audry Riles, PharmD   Please be sure to bring in all your medications bottles to every appointment.

## 2020-07-18 NOTE — Addendum Note (Signed)
Encounter addended by: Rafael Bihari, FNP on: 07/18/2020 4:35 PM  Actions taken: Medication long-term status modified, Order list changed

## 2020-07-18 NOTE — Progress Notes (Signed)
ReDS Vest / Clip - 07/18/20 1400       ReDS Vest / Clip   Station Marker C    Ruler Value 30    ReDS Value Range Low volume    ReDS Actual Value 31

## 2020-07-21 DIAGNOSIS — R570 Cardiogenic shock: Secondary | ICD-10-CM | POA: Diagnosis not present

## 2020-07-21 DIAGNOSIS — R2681 Unsteadiness on feet: Secondary | ICD-10-CM | POA: Diagnosis not present

## 2020-07-21 DIAGNOSIS — F4320 Adjustment disorder, unspecified: Secondary | ICD-10-CM | POA: Diagnosis not present

## 2020-07-21 DIAGNOSIS — M6281 Muscle weakness (generalized): Secondary | ICD-10-CM | POA: Diagnosis not present

## 2020-07-21 DIAGNOSIS — F330 Major depressive disorder, recurrent, mild: Secondary | ICD-10-CM | POA: Diagnosis not present

## 2020-07-21 DIAGNOSIS — F411 Generalized anxiety disorder: Secondary | ICD-10-CM | POA: Diagnosis not present

## 2020-07-21 DIAGNOSIS — F5101 Primary insomnia: Secondary | ICD-10-CM | POA: Diagnosis not present

## 2020-07-21 DIAGNOSIS — I4891 Unspecified atrial fibrillation: Secondary | ICD-10-CM | POA: Diagnosis not present

## 2020-07-29 NOTE — Addendum Note (Signed)
Encounter addended by: Rafael Bihari, FNP on: 07/29/2020 6:38 PM  Actions taken: Clinical Note Signed

## 2020-08-04 DEATH — deceased

## 2020-08-29 ENCOUNTER — Other Ambulatory Visit (HOSPITAL_COMMUNITY): Payer: Self-pay

## 2020-09-04 ENCOUNTER — Ambulatory Visit: Payer: Medicare Other | Admitting: Interventional Cardiology

## 2020-09-16 ENCOUNTER — Ambulatory Visit: Payer: Medicare Other | Admitting: Cardiology

## 2020-10-21 ENCOUNTER — Encounter (HOSPITAL_COMMUNITY): Payer: Medicare Other | Admitting: Internal Medicine

## 2021-04-29 IMAGING — CR DG CHEST 2V
2 series · 2 of 2 positions shown · non-contrast
Comparison: 12/22/2009 chest radiograph

CLINICAL DATA: 79-year-old male with chest pain and diabetes.

EXAM:
CHEST - 2 VIEW

[w chest pa]
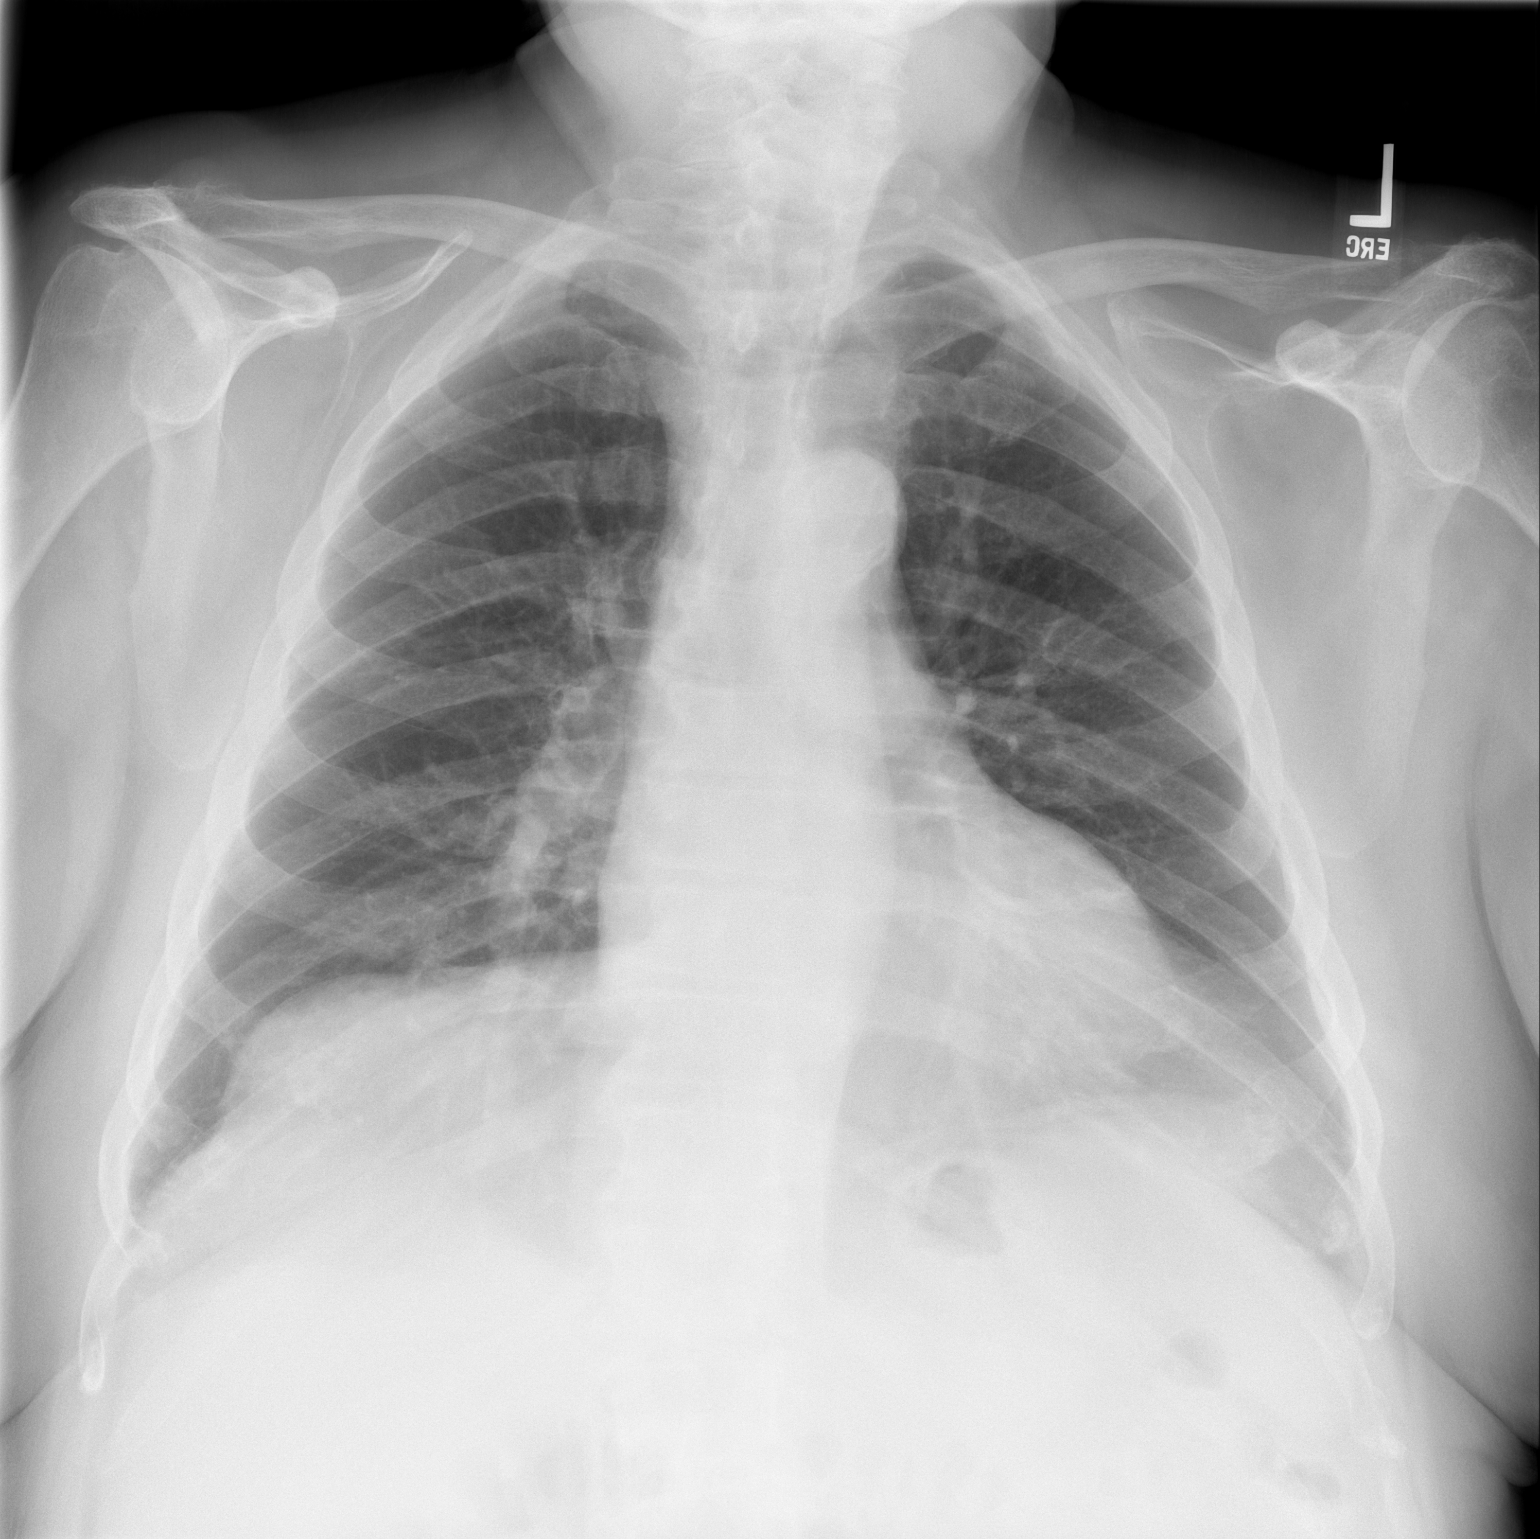

[w chest lat]
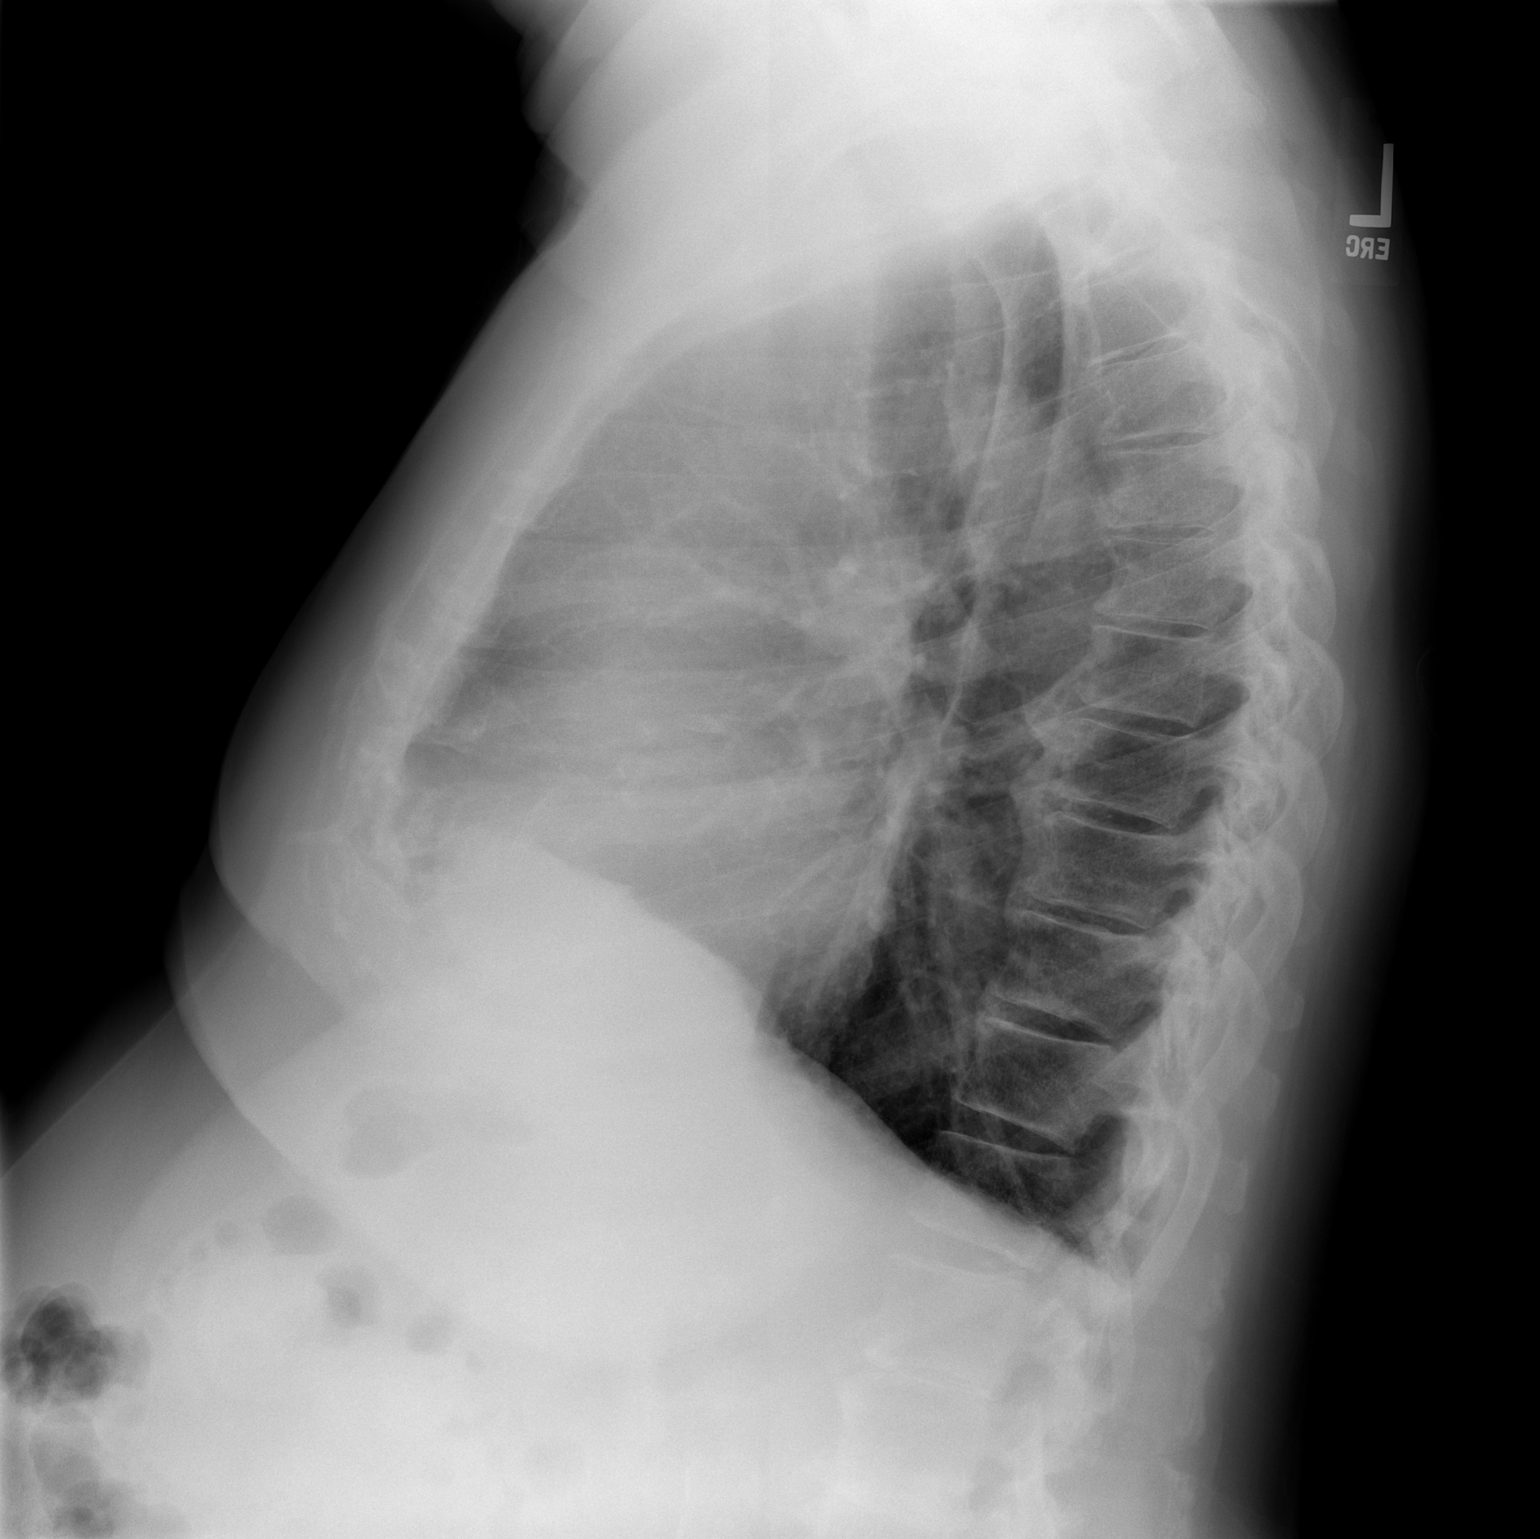

[2 of 2 positions shown; findings below may reference images not displayed]

FINDINGS: The cardiomediastinal silhouette is unremarkable.

Elevation RIGHT hemidiaphragm again noted.

There is no evidence of focal airspace disease, pulmonary edema,
suspicious pulmonary nodule/mass, pleural effusion, or pneumothorax.

No acute bony abnormalities are identified.
IMPRESSION: No active cardiopulmonary disease.

## 2022-03-20 IMAGING — DX DG CHEST 1V PORT
1 series · 1 of 1 positions shown · non-contrast
Comparison: June 14, 2020

CLINICAL DATA: Central line

EXAM:
PORTABLE CHEST 1 VIEW

[chest ap]
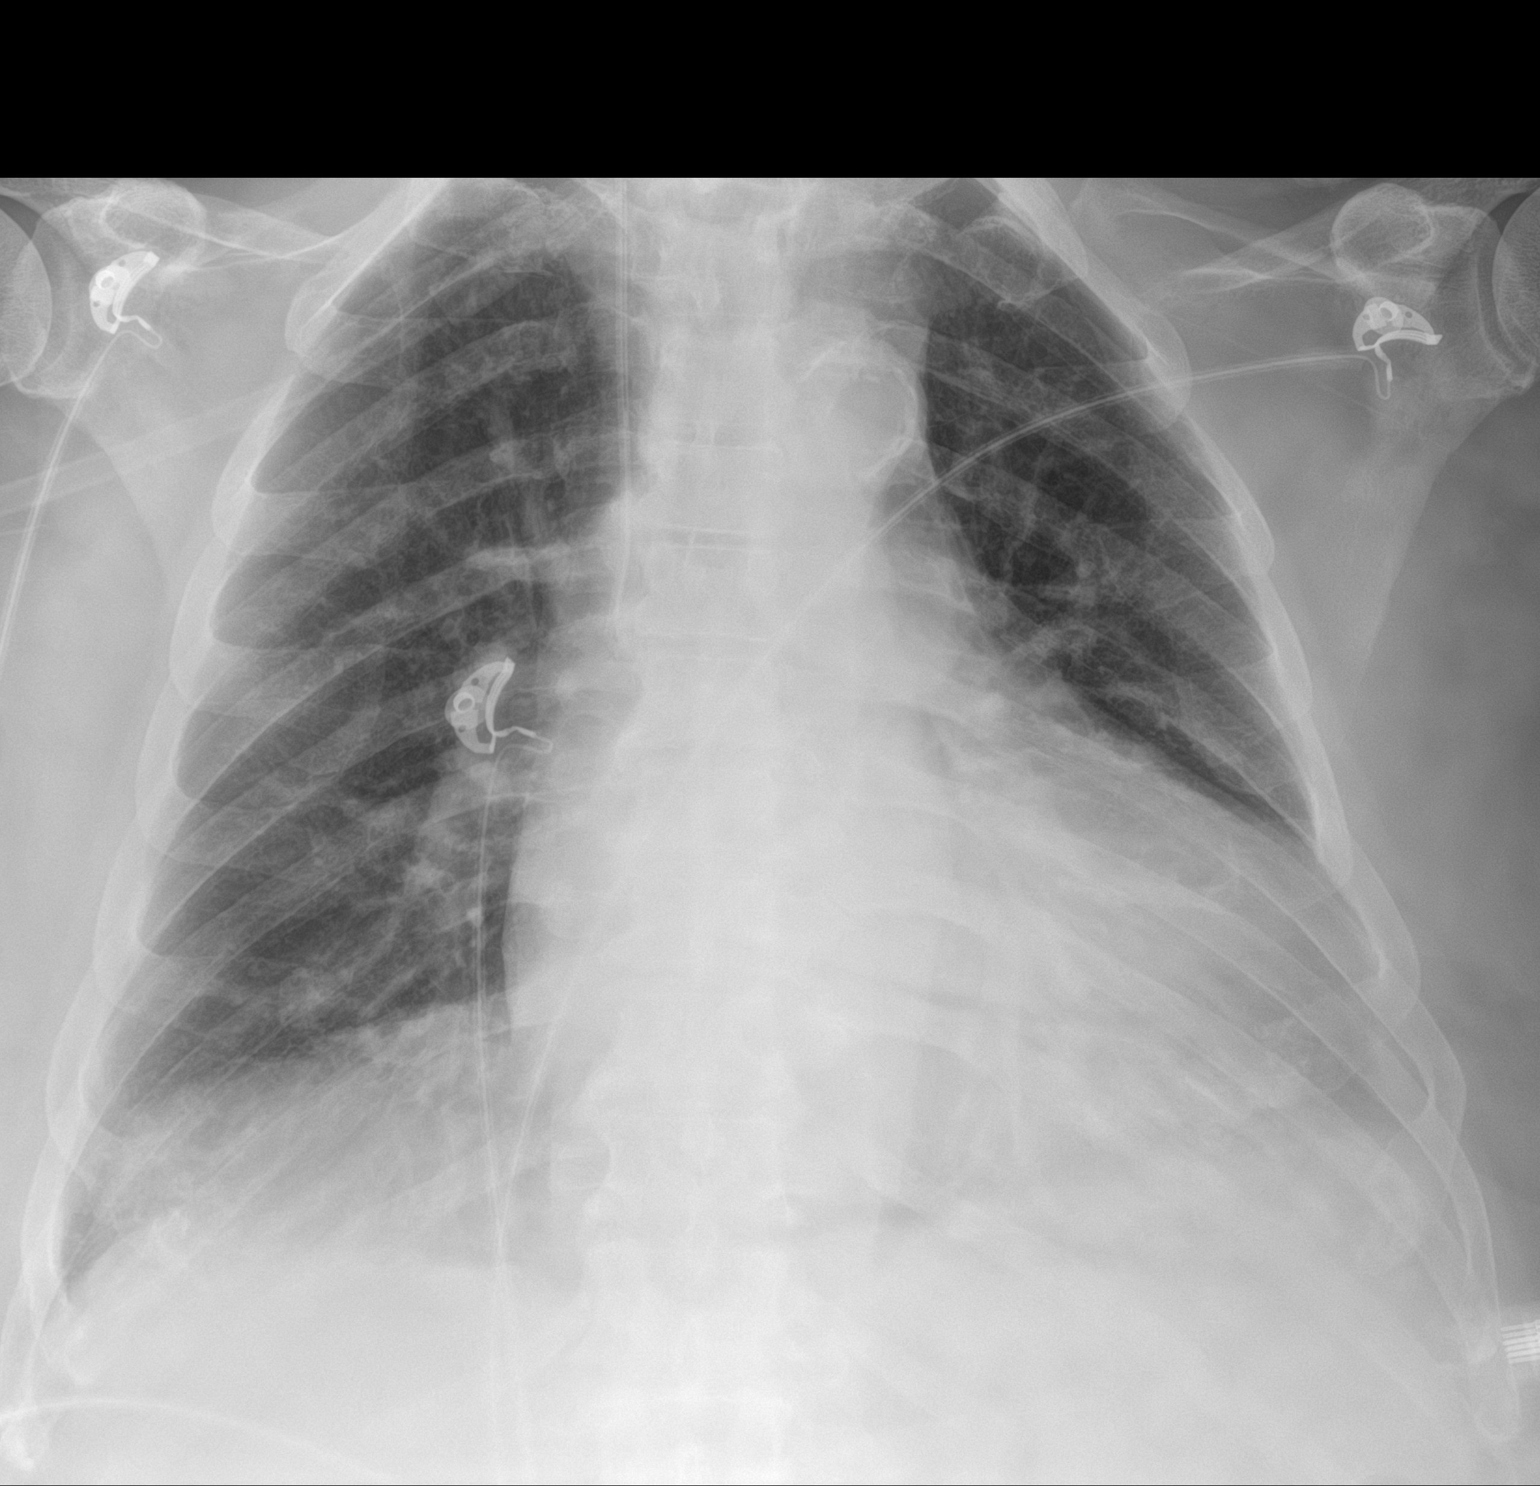

[1 of 1 positions shown; findings below may reference images not displayed]

FINDINGS: The cardiomediastinal silhouette is unchanged and enlarged in
contour.RIGHT IJ CVC tip terminates over the region of the inferior
SVC. Atherosclerotic calcifications of the aorta. No pleural
effusion. No pneumothorax. Unchanged LEFT retrocardiac opacity,
likely atelectasis no acute pleuroparenchymal abnormality.
Visualized abdomen is unremarkable. Multilevel degenerative changes
of the thoracic spine.
IMPRESSION: RIGHT IJ CVC tip terminates over the region of the inferior SVC.

## 2022-03-23 IMAGING — DX DG CHEST 1V PORT
1 series · 1 of 1 positions shown · non-contrast
Comparison: 06/15/2020

CLINICAL DATA: Fever R/o any infectious process

EXAM:
PORTABLE CHEST - 1 VIEW

[chest]
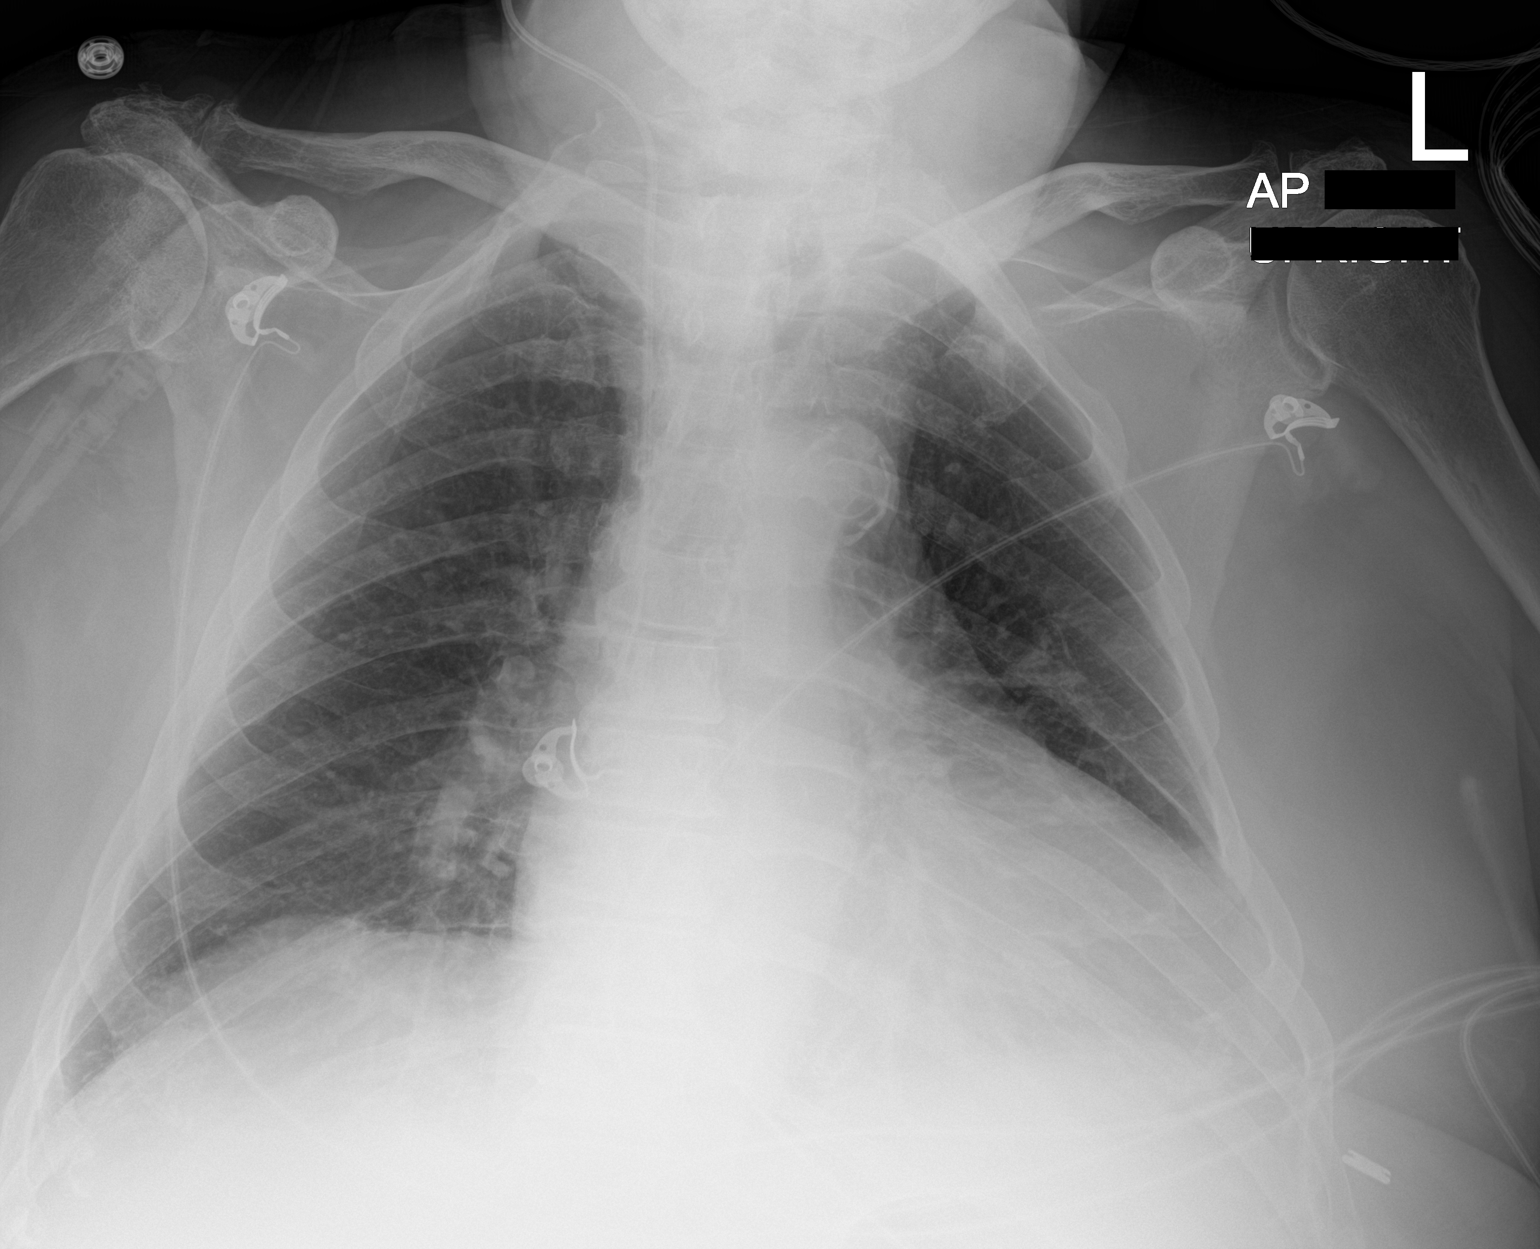

[1 of 1 positions shown; findings below may reference images not displayed]

FINDINGS: Relatively low lung volumes. No focal infiltrate or overt edema.
Stable right IJ central line to the cavoatrial junction.

Stable cardiomegaly.  Aortic Atherosclerosis ([id number]-170.0).

There is blunting of the left lateral costophrenic angle as before.
No pneumothorax.

Right acromioclavicular spurring. Regional bones otherwise
unremarkable.
IMPRESSION: Stable cardiomegaly.  No acute disease.

## 2022-03-24 IMAGING — DX DG CHEST 1V PORT
1 series · 1 of 1 positions shown · non-contrast
Comparison: 06/18/2020

CLINICAL DATA: Recent PICC line placement

EXAM:
PORTABLE CHEST 1 VIEW

[chest ap]
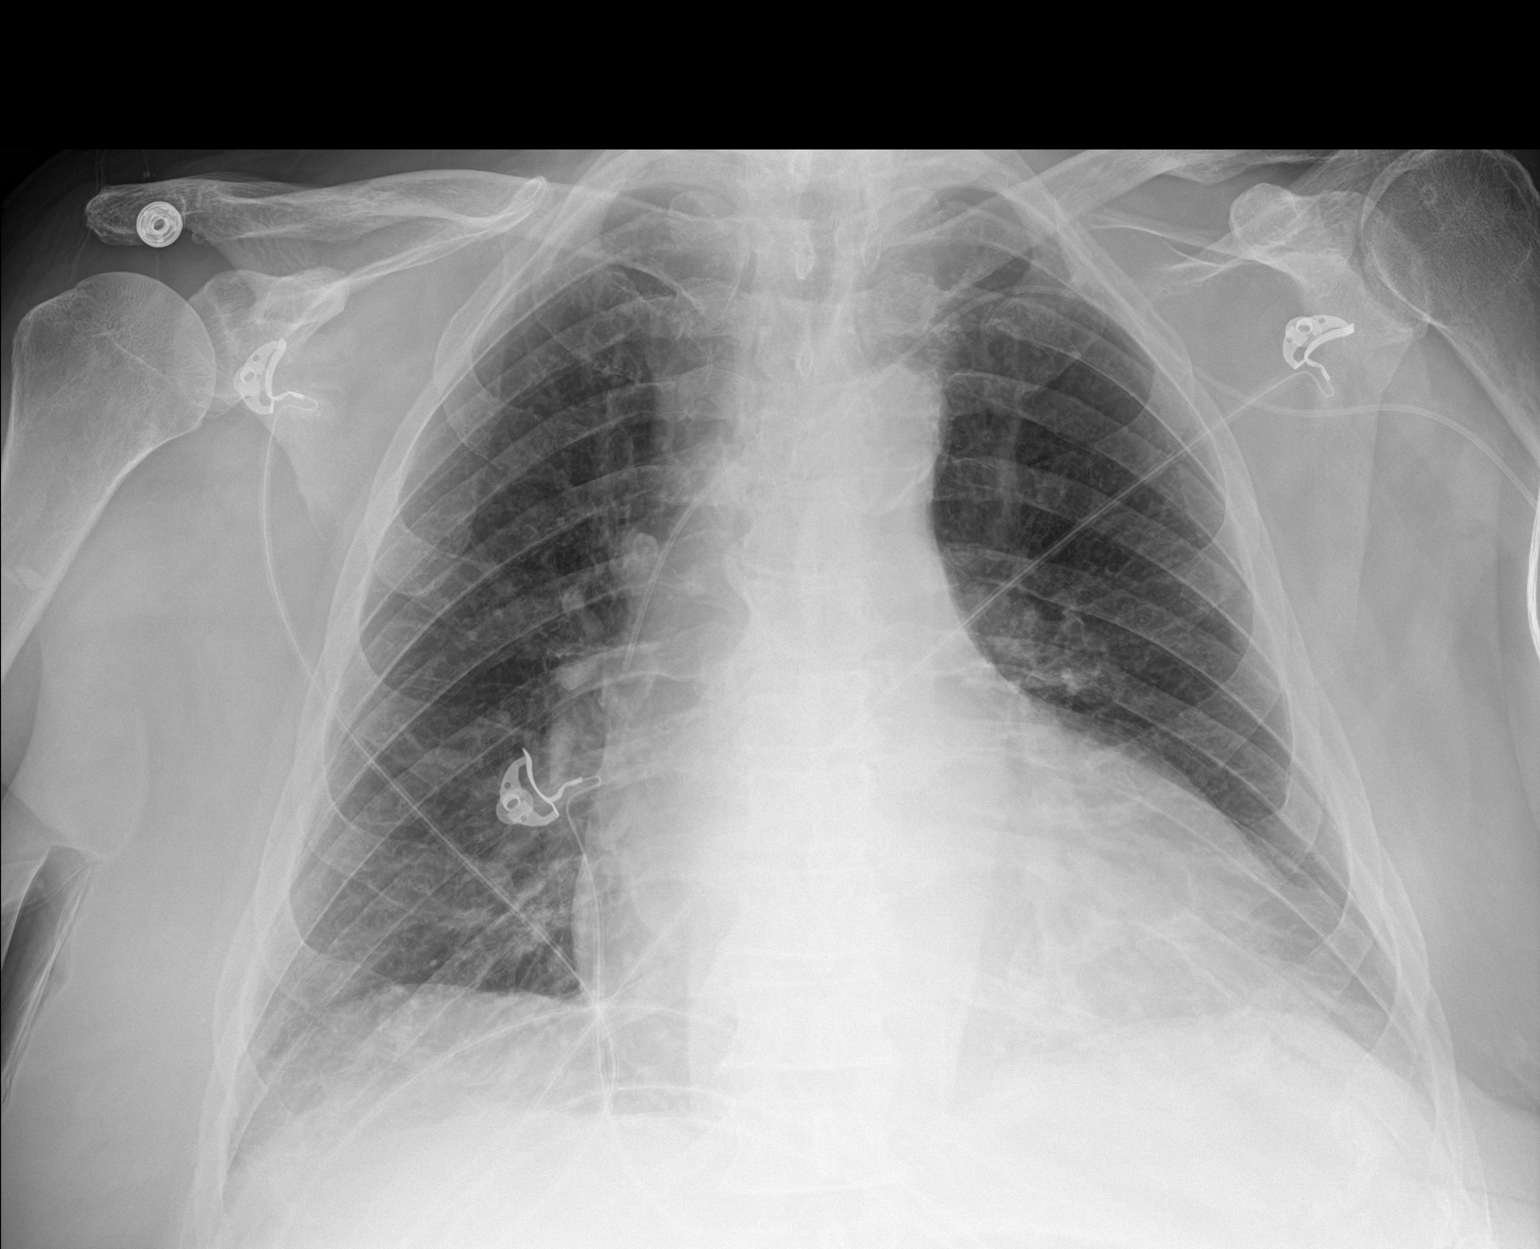

[1 of 1 positions shown; findings below may reference images not displayed]

FINDINGS: Cardiac shadow is enlarged. Left-sided PICC line is noted with the
catheter tip in the distal superior vena cava. Right jugular line
has been removed in the interval. Lungs are clear. No bony
abnormality is noted.
IMPRESSION: New left-sided PICC line as described.
